# Patient Record
Sex: Male | Born: 1955 | Race: White | Hispanic: No | Marital: Single | Smoking: Never smoker
Health system: Southern US, Community
[De-identification: ages and names within clinical notes are randomized; demographics above are authoritative.]

## PROBLEM LIST (undated history)

## (undated) DIAGNOSIS — T7840XA Allergy, unspecified, initial encounter: Secondary | ICD-10-CM

## (undated) DIAGNOSIS — J189 Pneumonia, unspecified organism: Secondary | ICD-10-CM

## (undated) DIAGNOSIS — J4540 Moderate persistent asthma, uncomplicated: Secondary | ICD-10-CM

## (undated) DIAGNOSIS — K4020 Bilateral inguinal hernia, without obstruction or gangrene, not specified as recurrent: Secondary | ICD-10-CM

## (undated) DIAGNOSIS — K219 Gastro-esophageal reflux disease without esophagitis: Secondary | ICD-10-CM

## (undated) DIAGNOSIS — I10 Essential (primary) hypertension: Secondary | ICD-10-CM

## (undated) DIAGNOSIS — R51 Headache: Secondary | ICD-10-CM

## (undated) DIAGNOSIS — M1990 Unspecified osteoarthritis, unspecified site: Secondary | ICD-10-CM

## (undated) DIAGNOSIS — S2249XA Multiple fractures of ribs, unspecified side, initial encounter for closed fracture: Secondary | ICD-10-CM

## (undated) DIAGNOSIS — T827XXA Infection and inflammatory reaction due to other cardiac and vascular devices, implants and grafts, initial encounter: Secondary | ICD-10-CM

## (undated) DIAGNOSIS — G709 Myoneural disorder, unspecified: Secondary | ICD-10-CM

## (undated) DIAGNOSIS — R32 Unspecified urinary incontinence: Secondary | ICD-10-CM

## (undated) DIAGNOSIS — G8921 Chronic pain due to trauma: Secondary | ICD-10-CM

## (undated) DIAGNOSIS — M5412 Radiculopathy, cervical region: Secondary | ICD-10-CM

## (undated) DIAGNOSIS — F329 Major depressive disorder, single episode, unspecified: Secondary | ICD-10-CM

## (undated) DIAGNOSIS — J45909 Unspecified asthma, uncomplicated: Secondary | ICD-10-CM

## (undated) DIAGNOSIS — F32A Depression, unspecified: Secondary | ICD-10-CM

## (undated) DIAGNOSIS — Z973 Presence of spectacles and contact lenses: Secondary | ICD-10-CM

## (undated) DIAGNOSIS — G250 Essential tremor: Secondary | ICD-10-CM

## (undated) DIAGNOSIS — J449 Chronic obstructive pulmonary disease, unspecified: Secondary | ICD-10-CM

## (undated) DIAGNOSIS — R0602 Shortness of breath: Secondary | ICD-10-CM

## (undated) DIAGNOSIS — F419 Anxiety disorder, unspecified: Secondary | ICD-10-CM

## (undated) DIAGNOSIS — K4090 Unilateral inguinal hernia, without obstruction or gangrene, not specified as recurrent: Secondary | ICD-10-CM

## (undated) HISTORY — PX: DEEP BRAIN STIMULATOR PLACEMENT: SHX608

## (undated) HISTORY — PX: NASAL SINUS SURGERY: SHX719

## (undated) HISTORY — DX: Chronic pain due to trauma: G89.21

## (undated) HISTORY — DX: Unspecified urinary incontinence: R32

## (undated) HISTORY — DX: Allergy, unspecified, initial encounter: T78.40XA

---
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
## 1999-06-17 ENCOUNTER — Encounter: Payer: Self-pay | Admitting: Physical Medicine & Rehabilitation

## 1999-06-17 ENCOUNTER — Ambulatory Visit (HOSPITAL_COMMUNITY)
Admission: RE | Admit: 1999-06-17 | Discharge: 1999-06-17 | Payer: Self-pay | Admitting: Physical Medicine & Rehabilitation

## 2006-09-18 ENCOUNTER — Ambulatory Visit (HOSPITAL_COMMUNITY): Admission: RE | Admit: 2006-09-18 | Discharge: 2006-09-18 | Payer: Self-pay | Admitting: Neurosurgery

## 2006-12-02 ENCOUNTER — Ambulatory Visit (HOSPITAL_COMMUNITY): Admission: RE | Admit: 2006-12-02 | Discharge: 2006-12-02 | Payer: Self-pay | Admitting: Neurosurgery

## 2006-12-03 ENCOUNTER — Inpatient Hospital Stay (HOSPITAL_COMMUNITY): Admission: RE | Admit: 2006-12-03 | Discharge: 2006-12-04 | Payer: Self-pay | Admitting: Neurosurgery

## 2006-12-03 HISTORY — PX: DEEP BRAIN STIMULATOR PLACEMENT: SHX608

## 2006-12-10 ENCOUNTER — Observation Stay (HOSPITAL_COMMUNITY): Admission: RE | Admit: 2006-12-10 | Discharge: 2006-12-11 | Payer: Self-pay | Admitting: Neurosurgery

## 2009-09-27 ENCOUNTER — Ambulatory Visit: Payer: Self-pay | Admitting: Internal Medicine

## 2010-11-12 NOTE — Op Note (Signed)
NAMELEEVI, CULLARS               ACCOUNT NO.:  [id number]   MEDICAL RECORD NO.:  [id number]          PATIENT TYPE:  INP   LOCATION:  2550                         FACILITY:  [facility]   PHYSICIAN:  Danae Orleans. Venetia Maxon, M.D.  DATE OF BIRTH:  30-Oct-1955   DATE OF PROCEDURE:  12/10/2006  DATE OF DISCHARGE:                               OPERATIVE REPORT   PREOPERATIVE DIAGNOSIS:  Status post stage I deep brain stimulating  electrode with bilateral the VIM electrodes.   FINAL DIAGNOSIS:  Status post stage I deep brain stimulating electrode  with bilateral the VIM electrodes.   PROCEDURE:  Stage II deep brain stimulator electrodes with Kinetra dual-  channel battery and extensions placed.   SURGEON:  Danae Orleans. Venetia Maxon, M.D.   ANESTHESIA:  General endotracheal anesthesia.   ESTIMATED BLOOD LOSS:  Minimal.   COMPLICATIONS:  None.   DISPOSITION:  To recovery.   INDICATIONS:  Rhyse Loux is a 55 year old man with a severe essential  tremor.  He has previously undergone placement of bilateral deep brain  stimulating electrodes through a stereotactic procedure.  This is stage  II, which entails connecting the implanted electrodes via extensions to  a supraclavicular dual-channel generator.   PROCEDURE:  Mr. Noreen is brought to the operating room.  Following the  satisfactory and uncomplicated induction of general endotracheal  anesthesia and placement of intravenous lines, the patient was placed in  the supine position on the operating table.  His left postauricular  scalp and upper chest were then prepped and draped in the usual sterile  fashion.  Incision was made overlying the previously-placed electrode  extensions and these were then exposed.  The 41 cm extensions were then  tunneled to a left supraclavicular subcutaneous pocket which was  created.  The Kinetra battery was then placed.  The extensions were  hooked to the battery.  The left brain electrode was hooked to the 0  through  3 contacts, the right brain was 4 through 7 contacts.  The  extensions were covered with Silastic booties in the standard fashion.  The left-sided electrode was covered with a white boot and the right  electrode was with a clear boot.  Silk 2-0 ties were placed over the  Silastic covers.  The redundant electrode was then circularized beneath  the battery and inserted the subcutaneous pocket.  All incisions were  closed with 2-0 Vicryl sutures and the supraclavicular incision was  closed with a 3-0 Vicryl subcuticular stitch.  The cranial incision  was closed with a running 3-0 nylon locked stitch.  The wounds were  dressed with a sterile occlusive dressing.  The patient was extubated in  the operating room and taken to the recovery room in stable and  satisfactory condition, having tolerated his operation well with all  counts correct at the end of the case.      Danae Orleans. Venetia Maxon, M.D.  Electronically Signed     JDS/MEDQ  D:  12/10/2006  T:  12/11/2006  Job:  [id number]

## 2010-11-12 NOTE — Op Note (Signed)
David Rhodes, David Rhodes               ACCOUNT NO.:  [id number]   MEDICAL RECORD NO.:  [id number]          PATIENT TYPE:  INP   LOCATION:  3114                         FACILITY:  [facility]   PHYSICIAN:  Danae Orleans. Venetia Maxon, M.D.  DATE OF BIRTH:  05/04/1956   DATE OF PROCEDURE:  DATE OF DISCHARGE:                               OPERATIVE REPORT   PREOPERATIVE DIAGNOSIS:  Essential tremor.   POSTOPERATIVE DIAGNOSIS:  Essential tremor.   PROCEDURE:  Stage I bilateral deep brain stimulator electrode placement  in the ventral intermediate nucleus of the thalamus using stealth CT and  MRI frameless stereotaxy with microelectrode recordings.   SURGEON:  Danae Orleans. Venetia Maxon, M.D.   ASSISTANT:  Georgiann Cocker, RN.   ANESTHESIA:  IV sedation monitored anesthesia with local lidocaine.   COMPLICATIONS:  None.   DISPOSITION:  Recovery.   INDICATIONS:  David Rhodes is a 55 year old man with a severe essential  tremor.  He has been refractory to medications.  It was elected to place  bilateral deep brain stimulating ventral intermediate nucleus thalamus  electrodes for tremor.   DESCRIPTION OF PROCEDURE:  Prior to surgery David Rhodes had had an MRI  without fiducials and a CT scan obtained with six fiducials placed in  the skull.  These scans were then merged and surgical planning was  performed prior to surgery with bilateral VIM thalamus targeting.  The  patient was then brought to the operating room; and he was placed in a  neck collar; and his scalp was registered using the [employer].  The planned bur holes were then marked after prepping the skin  with DuraPrep.  The skin was then infiltrated with 1% of local lidocaine  with epinephrine; and a twist drill was used to produce a mark to mark  the skull on each side.  These entry points were just anterior to the  coronal suture and about 3.5 cm off the midline.   Subsequently the scalp was then prepped and draped in the usual sterile  fashion and the area of planned incision was infiltrated with 1% local  lidocaine with epinephrine.  A curvilinear incision was created and a  scalp flap brought forward.  The patient was given intravenous Presidex  for sedation.  The bur holes were then placed bilaterally using 14-mm  perforator.  The dura was cauterized with bipolar electrocautery.  Bone  wax was used for hemostasis.  The Stimlock caps were then affixed to the  skull and, initially on the left side, using the Medtronic frameless  stereotactic system; a tower with a microdrive was then created; and  after confirming the targeting, the dura was opened.  The introducer  stylet was initially placed and then subsequently the microelectrode was  utilized.  There was good kinesthetic thalamic activity from  about 10  mm above target to 1.8 mm above target; and then a quiet zone.  Then  subsequently since it appeared to be consistent, transitioned into what  appeared to be consistent with the VC thalamus with some sensory  stimulation resulting.  After this the stimulating electrode  was placed;  and the patient had significant improvement in right-sided tremor at 0,  a negative 3 positive, with cessation of tremor identified on writing  task and also finger-nose-finger testing.  The electrode was then locked  into position; and tunneled into the left posteroparietal region just  above the ear.   Attention was then turned to the right side where similar microelectrode  recordings were obtained with a very similar transition to VC thalamus;  and after stimulating electrode was placed, the patient had excellent  relief of his tremor with transient paresthesias into the hand; and at  higher voltages into the mouth.  The electrodes were then locked into  position; and the cap was placed.  Again, this electrode was tunneled  just above the ear on the left side.  The wound was then irrigated and  closed with 2-0 Vicryl sutures  reapproximating the galea and a running 3-  0 nylon lock stitch.  The wound was dressed with bacitracin-Telfa.  The  the fiducial markers were then removed; and a sterile occlusive head  dressing was placed.  The patient was taken to recovery in stable  satisfactory condition, having tolerated the procedure well.      Danae Orleans. Venetia Maxon, M.D.  Electronically Signed     JDS/MEDQ  D:  12/03/2006  T:  12/03/2006  Job:  [id number]

## 2011-04-17 LAB — BASIC METABOLIC PANEL
BUN: 11
CO2: 26
Calcium: 9.3
Chloride: 105
Creatinine, Ser: 0.9
GFR calc Af Amer: >60
GFR calc non Af Amer: >60
Glucose, Bld: 92
Potassium: 4
Sodium: 136

## 2011-04-17 LAB — CBC
Hemoglobin: 15.4
MCHC: 33.3
MCV: 88.1
RBC: 5.25

## 2011-07-20 ENCOUNTER — Emergency Department: Payer: Self-pay | Admitting: Emergency Medicine

## 2011-07-20 LAB — COMPREHENSIVE METABOLIC PANEL
Albumin: 3.4 g/dL (ref 3.4–5.0)
Alkaline Phosphatase: 84 U/L (ref 50–136)
Anion Gap: 11 (ref 7–16)
Bilirubin,Total: 0.3 mg/dL (ref 0.2–1.0)
Calcium, Total: 9.4 mg/dL (ref 8.5–10.1)
Co2: 28 mmol/L (ref 21–32)
Creatinine: 0.98 mg/dL (ref 0.60–1.30)
EGFR (Non-African Amer.): >60
Glucose: 89 mg/dL (ref 65–99)
Osmolality: 289 (ref 275–301)
SGPT (ALT): 22 U/L
Sodium: 146 mmol/L — ABNORMAL HIGH (ref 136–145)

## 2011-07-20 LAB — URINALYSIS, COMPLETE
Bacteria: NONE SEEN
Bilirubin,UR: NEGATIVE
Glucose,UR: NEGATIVE mg/dL (ref 0–75)
Leukocyte Esterase: NEGATIVE
Nitrite: NEGATIVE
Ph: 6 (ref 4.5–8.0)
Protein: NEGATIVE
RBC,UR: <1 /HPF (ref 0–5)

## 2011-07-20 LAB — CBC
MCH: 30.2 pg (ref 26.0–34.0)
MCV: 90 fL (ref 80–100)
Platelet: 200 10*3/uL (ref 150–440)
RDW: 12.3 % (ref 11.5–14.5)
WBC: 8.8 10*3/uL (ref 3.8–10.6)

## 2011-11-14 ENCOUNTER — Telehealth: Payer: Self-pay | Admitting: Internal Medicine

## 2011-11-14 NOTE — Telephone Encounter (Signed)
Made pt appointment 5/31 Told pt several time that if pain is severe he needs to go to er or an urgent care   Fine to put in next 30-29min slot. However, if pain severe, needs to be seen in ED or urgent care. ----- Message ----- From: Wyatt Portela Sent: 11/14/2011 10:13 AM To: Shelia Media, MD, Sherlene Shams, MD Subject: new patient   [name] @ [facility] Call to see if we could see a new patient asap he has pain from a hernia and would like a referral but needs primary care dr first. David Rhodes September 24, 2055 [phone] Pt has medicare

## 2011-11-28 ENCOUNTER — Ambulatory Visit: Payer: Self-pay | Admitting: Internal Medicine

## 2011-12-02 ENCOUNTER — Other Ambulatory Visit: Payer: Self-pay | Admitting: Neurosurgery

## 2011-12-03 ENCOUNTER — Encounter (HOSPITAL_COMMUNITY): Payer: Self-pay | Admitting: Respiratory Therapy

## 2011-12-04 ENCOUNTER — Ambulatory Visit: Payer: Self-pay | Admitting: Otolaryngology

## 2011-12-04 NOTE — Pre-Procedure Instructions (Signed)
20 David Rhodes  12/04/2011   Your procedure is scheduled on:  Tues, June 11 @ 10:20 AM  Report to [facility] at 7:15 AM.  Call this number if you have problems the morning of surgery: [phone]   Remember:   Do not eat food:After Midnight.  May have clear liquids: up to 4 Hours before arrival.(until 3:15 am)  Clear liquids include soda, tea, black coffee, apple or grape juice, broth,water  Take these medicines the morning of surgery with A SIP OF WATER: Amlodipine(Norvasc),Allegra(Fexofenadine),and Gabapentin(Neurontin)   Do not wear jewelry  Do not wear lotions, powders, or cologne             Men may shave face and neck.  Do not bring valuables to the hospital.  Contacts, dentures or bridgework may not be worn into surgery.  Leave suitcase in the car. After surgery it may be brought to your room.  For patients admitted to the hospital, checkout time is 11:00 AM the day of discharge.   Patients discharged the day of surgery will not be allowed to drive home.    Special Instructions: CHG Shower Use Special Wash: 1/2 bottle night before surgery and 1/2 bottle morning of surgery.   Please read over the following fact sheets that you were given: Pain Booklet, Coughing and Deep Breathing, MRSA Information and Surgical Site Infection Prevention

## 2011-12-05 ENCOUNTER — Inpatient Hospital Stay (HOSPITAL_COMMUNITY): Admission: RE | Admit: 2011-12-05 | Payer: Medicare Other | Source: Ambulatory Visit

## 2011-12-08 ENCOUNTER — Inpatient Hospital Stay (HOSPITAL_COMMUNITY): Admission: RE | Admit: 2011-12-08 | Discharge: 2011-12-08 | Payer: Medicare Other | Source: Ambulatory Visit

## 2011-12-08 MED ORDER — CEFAZOLIN SODIUM 1-5 GM-% IV SOLN: 1.0000 g | INTRAVENOUS | Status: DC

## 2011-12-08 NOTE — Progress Notes (Signed)
We have been unable to reach patient after numerous calls, I left message on his phone to arrive at [facility] at 0745, NPO after MN.  I also instructed him to take Amlodipine, Allerga, Singular and Requip with a sip of water and if he has any questions to call [phone].

## 2011-12-09 ENCOUNTER — Encounter (HOSPITAL_COMMUNITY)
Admission: RE | Disposition: A | Discharge status: Home / Self Care | Payer: Self-pay | Source: Ambulatory Visit | Attending: Neurosurgery

## 2011-12-09 ENCOUNTER — Encounter (HOSPITAL_COMMUNITY): Payer: Self-pay | Admitting: Anesthesiology

## 2011-12-09 ENCOUNTER — Ambulatory Visit (HOSPITAL_COMMUNITY)
Admission: RE | Admit: 2011-12-09 | Discharge: 2011-12-09 | Disposition: A | Discharge status: Home / Self Care | Payer: Medicare Other | Source: Ambulatory Visit | Attending: Neurosurgery | Admitting: Neurosurgery

## 2011-12-09 ENCOUNTER — Ambulatory Visit (HOSPITAL_COMMUNITY): Payer: Medicare Other | Admitting: Anesthesiology

## 2011-12-09 ENCOUNTER — Ambulatory Visit: Payer: Self-pay | Admitting: Internal Medicine

## 2011-12-09 ENCOUNTER — Ambulatory Visit (HOSPITAL_COMMUNITY): Payer: Medicare Other

## 2011-12-09 ENCOUNTER — Encounter (HOSPITAL_COMMUNITY): Payer: Self-pay | Admitting: *Deleted

## 2011-12-09 DIAGNOSIS — Z462 Encounter for fitting and adjustment of other devices related to nervous system and special senses: Secondary | ICD-10-CM | POA: Insufficient documentation

## 2011-12-09 DIAGNOSIS — G250 Essential tremor: Secondary | ICD-10-CM | POA: Insufficient documentation

## 2011-12-09 DIAGNOSIS — G252 Other specified forms of tremor: Secondary | ICD-10-CM | POA: Insufficient documentation

## 2011-12-09 HISTORY — DX: Gastro-esophageal reflux disease without esophagitis: K21.9

## 2011-12-09 HISTORY — DX: Myoneural disorder, unspecified: G70.9

## 2011-12-09 HISTORY — DX: Shortness of breath: R06.02

## 2011-12-09 HISTORY — DX: Headache: R51

## 2011-12-09 HISTORY — DX: Anxiety disorder, unspecified: F41.9

## 2011-12-09 HISTORY — DX: Major depressive disorder, single episode, unspecified: F32.9

## 2011-12-09 HISTORY — PX: SUBTHALAMIC STIMULATOR BATTERY REPLACEMENT: SHX5405

## 2011-12-09 HISTORY — DX: Essential (primary) hypertension: I10

## 2011-12-09 HISTORY — DX: Depression, unspecified: F32.A

## 2011-12-09 HISTORY — DX: Pneumonia, unspecified organism: J18.9

## 2011-12-09 LAB — CBC
MCH: 30 pg (ref 26.0–34.0)
MCHC: 33.8 g/dL (ref 30.0–36.0)
MCV: 88.9 fL (ref 78.0–100.0)
Platelets: 230 10*3/uL (ref 150–400)
RDW: 13.4 % (ref 11.5–15.5)
WBC: 13.9 10*3/uL — ABNORMAL HIGH (ref 4.0–10.5)

## 2011-12-09 LAB — BASIC METABOLIC PANEL
Chloride: 104 mEq/L (ref 96–112)
GFR calc Af Amer: 88 mL/min — ABNORMAL LOW (ref >90)
GFR calc non Af Amer: 76 mL/min — ABNORMAL LOW (ref >90)
Potassium: 3.3 mEq/L — ABNORMAL LOW (ref 3.5–5.1)
Sodium: 142 mEq/L (ref 135–145)

## 2011-12-09 LAB — SURGICAL PCR SCREEN
MRSA, PCR: NEGATIVE
Staphylococcus aureus: NEGATIVE

## 2011-12-09 SURGERY — SUBTHALAMIC STIMULATOR BATTERY REPLACEMENT
Anesthesia: General | Site: Chest | Wound class: Clean

## 2011-12-09 MED ORDER — ACETAMINOPHEN 325 MG PO TABS: 650.0000 mg | ORAL_TABLET | ORAL | Status: DC | PRN

## 2011-12-09 MED ORDER — HYDROCODONE-ACETAMINOPHEN 5-325 MG PO TABS: 1.0000 | ORAL_TABLET | ORAL | Status: DC | PRN

## 2011-12-09 MED ORDER — SODIUM CHLORIDE 0.9 % IV SOLN
INTRAVENOUS | Status: AC
  Filled 2011-12-09: qty 500

## 2011-12-09 MED ORDER — LACTATED RINGERS IV SOLN
INTRAVENOUS | Status: DC | PRN
  Administered 2011-12-09: 10:00:00 via INTRAVENOUS

## 2011-12-09 MED ORDER — VANCOMYCIN HCL 1000 MG IV SOLR
INTRAVENOUS | Status: AC
  Filled 2011-12-09: qty 1000

## 2011-12-09 MED ORDER — SODIUM CHLORIDE 0.9 % IR SOLN
Status: DC | PRN
  Administered 2011-12-09: 11:00:00

## 2011-12-09 MED ORDER — ROPINIROLE HCL 1 MG PO TABS
2.0000 mg | ORAL_TABLET | Freq: Two times a day (BID) | ORAL | Status: DC
  Administered 2011-12-09: 2 mg via ORAL
  Filled 2011-12-09 (×2): qty 2

## 2011-12-09 MED ORDER — MUPIROCIN 2 % EX OINT
TOPICAL_OINTMENT | CUTANEOUS | Status: AC
  Administered 2011-12-09: 1
  Filled 2011-12-09: qty 22

## 2011-12-09 MED ORDER — ACETAMINOPHEN 10 MG/ML IV SOLN
INTRAVENOUS | Status: AC
  Administered 2011-12-09: 1000 mg via INTRAVENOUS
  Filled 2011-12-09: qty 100

## 2011-12-09 MED ORDER — MORPHINE SULFATE 2 MG/ML IJ SOLN: 1.0000 mg | INTRAMUSCULAR | Status: DC | PRN

## 2011-12-09 MED ORDER — SODIUM CHLORIDE 0.9 % IJ SOLN
3.0000 mL | Freq: Two times a day (BID) | INTRAMUSCULAR | Status: DC
  Administered 2011-12-09: 3 mL via INTRAVENOUS

## 2011-12-09 MED ORDER — SODIUM CHLORIDE 0.9 % IV SOLN
200.0000 ug | INTRAVENOUS | Status: DC | PRN
  Administered 2011-12-09: 0.4 ug/kg/h via INTRAVENOUS

## 2011-12-09 MED ORDER — BACITRACIN 50000 UNITS IM SOLR
INTRAMUSCULAR | Status: AC
  Filled 2011-12-09: qty 1

## 2011-12-09 MED ORDER — LORATADINE 10 MG PO TABS: 10.0000 mg | ORAL_TABLET | Freq: Every day | ORAL | Status: DC

## 2011-12-09 MED ORDER — ROPINIROLE HCL 1 MG PO TABS: 2.0000 mg | ORAL_TABLET | Freq: Two times a day (BID) | ORAL | Status: DC

## 2011-12-09 MED ORDER — LIDOCAINE-EPINEPHRINE 1 %-1:100000 IJ SOLN
INTRAMUSCULAR | Status: DC | PRN
  Administered 2011-12-09: 7 mL

## 2011-12-09 MED ORDER — 0.9 % SODIUM CHLORIDE (POUR BTL) OPTIME
TOPICAL | Status: DC | PRN
  Administered 2011-12-09: 1000 mL

## 2011-12-09 MED ORDER — MONTELUKAST SODIUM 10 MG PO TABS
10.0000 mg | ORAL_TABLET | Freq: Every day | ORAL | Status: DC
  Filled 2011-12-09: qty 1

## 2011-12-09 MED ORDER — MENTHOL 3 MG MT LOZG: 1.0000 | LOZENGE | OROMUCOSAL | Status: DC | PRN

## 2011-12-09 MED ORDER — HYDROMORPHONE HCL PF 1 MG/ML IJ SOLN: 0.2500 mg | INTRAMUSCULAR | Status: DC | PRN

## 2011-12-09 MED ORDER — MIDAZOLAM HCL 5 MG/5ML IJ SOLN
INTRAMUSCULAR | Status: DC | PRN
  Administered 2011-12-09: 2 mg via INTRAVENOUS

## 2011-12-09 MED ORDER — BUPIVACAINE HCL (PF) 0.5 % IJ SOLN
INTRAMUSCULAR | Status: DC | PRN
  Administered 2011-12-09: 7 mL

## 2011-12-09 MED ORDER — PHENOL 1.4 % MT LIQD: 1.0000 | OROMUCOSAL | Status: DC | PRN

## 2011-12-09 MED ORDER — DEXTROSE 5 % IV SOLN
INTRAVENOUS | Status: DC | PRN
  Administered 2011-12-09: 10:00:00 via INTRAVENOUS

## 2011-12-09 MED ORDER — ONDANSETRON HCL 4 MG/2ML IJ SOLN
INTRAMUSCULAR | Status: DC | PRN
  Administered 2011-12-09: 4 mg via INTRAVENOUS

## 2011-12-09 MED ORDER — SODIUM CHLORIDE 0.9 % IV SOLN
0.4000 ug/kg/h | INTRAVENOUS | Status: DC
  Filled 2011-12-09: qty 4

## 2011-12-09 MED ORDER — VANCOMYCIN HCL IN DEXTROSE 1-5 GM/200ML-% IV SOLN
INTRAVENOUS | Status: AC
  Filled 2011-12-09: qty 200

## 2011-12-09 MED ORDER — ACETAMINOPHEN 650 MG RE SUPP: 650.0000 mg | RECTAL | Status: DC | PRN

## 2011-12-09 MED ORDER — DOCUSATE SODIUM 100 MG PO CAPS: 100.0000 mg | ORAL_CAPSULE | Freq: Two times a day (BID) | ORAL | Status: DC

## 2011-12-09 MED ORDER — AMLODIPINE BESYLATE 10 MG PO TABS
10.0000 mg | ORAL_TABLET | Freq: Every day | ORAL | Status: DC
  Administered 2011-12-09: 10 mg via ORAL
  Filled 2011-12-09: qty 1

## 2011-12-09 MED ORDER — OXYCODONE-ACETAMINOPHEN 5-325 MG PO TABS: 1.0000 | ORAL_TABLET | ORAL | Status: DC | PRN

## 2011-12-09 MED ORDER — GABAPENTIN 600 MG PO TABS
600.0000 mg | ORAL_TABLET | Freq: Three times a day (TID) | ORAL | Status: DC
  Administered 2011-12-09: 600 mg via ORAL
  Filled 2011-12-09 (×2): qty 1

## 2011-12-09 MED ORDER — LISINOPRIL 40 MG PO TABS
40.0000 mg | ORAL_TABLET | Freq: Every day | ORAL | Status: DC
Start: 2011-12-09 — End: 2011-12-09
  Administered 2011-12-09: 40 mg via ORAL
  Filled 2011-12-09: qty 1

## 2011-12-09 MED ORDER — SODIUM CHLORIDE 0.9 % IJ SOLN: 3.0000 mL | INTRAMUSCULAR | Status: DC | PRN

## 2011-12-09 MED ORDER — VANCOMYCIN HCL 1000 MG IV SOLR
INTRAVENOUS | Status: DC | PRN
  Administered 2011-12-09: 1000 mg via TOPICAL

## 2011-12-09 MED ORDER — ONDANSETRON HCL 4 MG/2ML IJ SOLN: 4.0000 mg | Freq: Once | INTRAMUSCULAR | Status: DC | PRN

## 2011-12-09 MED ORDER — KCL IN DEXTROSE-NACL 20-5-0.45 MEQ/L-%-% IV SOLN
INTRAVENOUS | Status: DC
  Filled 2011-12-09 (×2): qty 1000

## 2011-12-09 MED ORDER — ONDANSETRON HCL 4 MG/2ML IJ SOLN: 4.0000 mg | INTRAMUSCULAR | Status: DC | PRN

## 2011-12-09 MED ORDER — FENTANYL CITRATE 0.05 MG/ML IJ SOLN
INTRAMUSCULAR | Status: DC | PRN
  Administered 2011-12-09: 50 ug via INTRAVENOUS

## 2011-12-09 MED ORDER — VANCOMYCIN HCL IN DEXTROSE 1-5 GM/200ML-% IV SOLN
1000.0000 mg | Freq: Once | INTRAVENOUS | Status: AC
  Administered 2011-12-09: 1000 mg via INTRAVENOUS
  Filled 2011-12-09: qty 200

## 2011-12-09 SURGICAL SUPPLY — 62 items
2x4 pocket adapter ×1 IMPLANT
APL SKNCLS STERI-STRIP NONHPOA (GAUZE/BANDAGES/DRESSINGS) ×1
BAG DECANTER FOR FLEXI CONT (MISCELLANEOUS) ×2 IMPLANT
BENZOIN TINCTURE PRP APPL 2/3 (GAUZE/BANDAGES/DRESSINGS) ×1 IMPLANT
BLADE SURG 10 STRL SS (BLADE) ×2 IMPLANT
BLADE SURG 15 STRL LF DISP TIS (BLADE) ×1 IMPLANT
BLADE SURG 15 STRL SS (BLADE) ×2
BOOT SUTURE AID YELLOW STND (SUTURE) ×1 IMPLANT
CANISTER SUCTION 2500CC (MISCELLANEOUS) ×2 IMPLANT
CLOTH BEACON ORANGE TIMEOUT ST (SAFETY) ×2 IMPLANT
CORDS BIPOLAR (ELECTRODE) ×2 IMPLANT
DRAPE INCISE IOBAN 66X45 STRL (DRAPES) ×2 IMPLANT
DRAPE LAPAROTOMY 100X72 PEDS (DRAPES) ×2 IMPLANT
DRAPE POUCH INSTRU U-SHP 10X18 (DRAPES) ×2 IMPLANT
DRESSING TELFA 8X3 (GAUZE/BANDAGES/DRESSINGS) ×2 IMPLANT
DRSG OPSITE 4X5.5 SM (GAUZE/BANDAGES/DRESSINGS) IMPLANT
ELECT CAUTERY BLADE 6.4 (BLADE) ×2 IMPLANT
GAUZE SPONGE 4X4 16PLY XRAY LF (GAUZE/BANDAGES/DRESSINGS) ×2 IMPLANT
GLOVE BIO SURGEON STRL SZ8 (GLOVE) ×2 IMPLANT
GLOVE BIOGEL PI IND STRL 7.0 (GLOVE) IMPLANT
GLOVE BIOGEL PI IND STRL 8 (GLOVE) ×1 IMPLANT
GLOVE BIOGEL PI IND STRL 8.5 (GLOVE) ×1 IMPLANT
GLOVE BIOGEL PI INDICATOR 7.0 (GLOVE) ×2
GLOVE BIOGEL PI INDICATOR 8 (GLOVE) ×1
GLOVE BIOGEL PI INDICATOR 8.5 (GLOVE) ×1
GLOVE ECLIPSE 7.5 STRL STRAW (GLOVE) ×1 IMPLANT
GLOVE ECLIPSE 8.0 STRL XLNG CF (GLOVE) ×1 IMPLANT
GLOVE EXAM NITRILE LRG STRL (GLOVE) IMPLANT
GLOVE EXAM NITRILE MD LF STRL (GLOVE) ×1 IMPLANT
GLOVE EXAM NITRILE XL STR (GLOVE) IMPLANT
GLOVE EXAM NITRILE XS STR PU (GLOVE) IMPLANT
GLOVE SURG SS PI 6.5 STRL IVOR (GLOVE) ×3 IMPLANT
GOWN BRE IMP SLV AUR LG STRL (GOWN DISPOSABLE) ×2 IMPLANT
GOWN BRE IMP SLV AUR XL STRL (GOWN DISPOSABLE) ×2 IMPLANT
GOWN STRL REIN 2XL LVL4 (GOWN DISPOSABLE) ×2 IMPLANT
KIT BASIN OR (CUSTOM PROCEDURE TRAY) ×2 IMPLANT
KIT ROOM TURNOVER OR (KITS) ×2 IMPLANT
NDL HYPO 25X1 1.5 SAFETY (NEEDLE) ×1 IMPLANT
NEEDLE HYPO 25X1 1.5 SAFETY (NEEDLE) ×2 IMPLANT
NEUROSTIM OCTOPOLAR ~~LOC~~ 49X65 (Neuro Prosthesis/Implant) ×1 IMPLANT
NS IRRIG 1000ML POUR BTL (IV SOLUTION) ×2 IMPLANT
PACK SURGICAL SETUP 50X90 (CUSTOM PROCEDURE TRAY) ×2 IMPLANT
PAD ARMBOARD 7.5X6 YLW CONV (MISCELLANEOUS) ×6 IMPLANT
PENCIL BUTTON HOLSTER BLD 10FT (ELECTRODE) ×2 IMPLANT
SPONGE GAUZE 4X4 12PLY (GAUZE/BANDAGES/DRESSINGS) ×2 IMPLANT
STAPLER SKIN PROX WIDE 3.9 (STAPLE) ×2 IMPLANT
STOCKINETTE 4X48 STRL (DRAPES) IMPLANT
STRIP CLOSURE SKIN 1/2X4 (GAUZE/BANDAGES/DRESSINGS) ×2 IMPLANT
SUT ETHILON 3 0 PS 1 (SUTURE) IMPLANT
SUT SILK 2 0 TIES 10X30 (SUTURE) IMPLANT
SUT VIC AB 2-0 CP2 18 (SUTURE) ×2 IMPLANT
SUT VIC AB 3-0 SH 8-18 (SUTURE) ×3 IMPLANT
SWABSTICK BENZOIN STERILE (MISCELLANEOUS) ×1 IMPLANT
SYR BULB 3OZ (MISCELLANEOUS) ×2 IMPLANT
SYR BULB IRRIGATION 50ML (SYRINGE) ×1 IMPLANT
SYR CONTROL 10ML LL (SYRINGE) ×2 IMPLANT
TAPE CLOTH SURG 4X10 WHT LF (GAUZE/BANDAGES/DRESSINGS) ×1 IMPLANT
TOWEL OR 17X24 6PK STRL BLUE (TOWEL DISPOSABLE) ×2 IMPLANT
TOWEL OR 17X26 10 PK STRL BLUE (TOWEL DISPOSABLE) ×2 IMPLANT
TUBE CONNECTING 12X1/4 (SUCTIONS) ×2 IMPLANT
UNDERPAD 30X30 INCONTINENT (UNDERPADS AND DIAPERS) ×1 IMPLANT
WATER STERILE IRR 1000ML POUR (IV SOLUTION) ×2 IMPLANT

## 2011-12-09 NOTE — Preoperative (Signed)
Beta Blockers   Reason not to administer Beta Blockers:Not Applicable 

## 2011-12-09 NOTE — Transfer of Care (Signed)
Immediate Anesthesia Transfer of Care Note  Patient: David Rhodes  Procedure(s) Performed: Procedure(s) (LRB): SUBTHALAMIC STIMULATOR BATTERY REPLACEMENT (N/A)  Patient Location: PACU  Anesthesia Type: MAC  Level of Consciousness: sedated, patient cooperative and responds to stimulation  Airway & Oxygen Therapy: Patient Spontanous Breathing  Post-op Assessment: Report given to PACU RN, Post -op Vital signs reviewed and stable, Patient moving all extremities and Patient moving all extremities X 4  Post vital signs: Reviewed and stable  Complications: No apparent anesthesia complications

## 2011-12-09 NOTE — Op Note (Signed)
12/09/2011  11:28 AM  PATIENT:  David Rhodes  56 y.o. male  PRE-OPERATIVE DIAGNOSIS:  Essential Tremor with Deep Brain Stimulators and Depleted Implantable Pulse Generator  POST-OPERATIVE DIAGNOSIS: Essential Tremor with Deep Brain Stimulators and Depleted Implantable Pulse Generator  PROCEDURE:  Procedure(s) (LRB): THALAMIC STIMULATOR BATTERY REPLACEMENT (N/A)  SURGEON:  Surgeon(s) and Role:    * Maeola Harman, MD - Primary  PHYSICIAN ASSISTANT:   ASSISTANTS: Poteat, RN  ANESTHESIA:   local and IV sedation  EBL:  Total I/O In: 500 [I.V.:500] Out: -   BLOOD ADMINISTERED:none  DRAINS: none   LOCAL MEDICATIONS USED:  LIDOCAINE   SPECIMEN:  No Specimen  DISPOSITION OF SPECIMEN:  N/A  COUNTS:  YES  TOURNIQUET:  * No tourniquets in log *  DICTATION: DICTATION: Patient has implanted thalamic stimulator electrodes and IPG, which is now depleted.  It was elected for patient to undergo IPG revision.  PROCEDURE: Patient was brought to the operating room and given intravenous sedation.  Left upper chest was prepped with betadine scrub and paint.  Area of planned incision was infiltrated with lidocaine.  Prior incision was reopened and the old IPG was externalized.  Adaptor was connected to new IPG which was placed in the pocket.  Wound was irrigated with bacitracin and with vancomycin. Then irrigated once more.  Incision was closed with 2-0 Vicryl and 3-0 vicryl sutures and dressed with a sterile occlusive dressing.  Counts were correct at the end of the case.  PLAN OF CARE: Admit for overnight observation  PATIENT DISPOSITION:  PACU - hemodynamically stable.   Delay start of Pharmacological VTE agent (>24hrs) due to surgical blood loss or risk of bleeding: yes

## 2011-12-09 NOTE — Discharge Summary (Signed)
Physician Discharge Summary  Patient ID: David Rhodes MRN: [id number] DOB/AGE: 56-Jul-1957 56 y.o.  Admit date: 12/09/2011 Discharge date: 12/09/2011  Admission Diagnoses: Essential Tremor with Deep Brain Stimulators and Depleted Implantable Pulse Generator    Discharge Diagnoses: Essential Tremor with Deep Brain Stimulators and Depleted Implantable Pulse Generator s/p THALAMIC STIMULATOR BATTERY REPLACEMENT    Active Problems:  * No active hospital problems. *    Discharged Condition: good  Hospital Course: David Rhodes was admitted 12/09/11 for replacement of his deep brain stimulator's implanted pulse generator/battery.  Following an uncomplicated surgery and recovery in Neuro PACU, he transferred to 3500 for observation.    Consults: None  Significant Diagnostic Studies: None  Treatments: surgery: THALAMIC STIMULATOR BATTERY REPLACEMENT   Discharge Exam: Blood pressure 119/68, pulse 68, temperature 97.4 F (36.3 C), temperature source Oral, resp. rate 16, height 6' (1.829 m), weight 81.647 kg (180 lb), SpO2 97.00%. Alert, conversant, without c/o pain or discomfort. Pt voices frustration with the recommendation that he stay overnight following the battery replacement. After discussion of the anesthesia used (due to the widening of the battery pocket that was necessary), he verbalizes understanding of concerns that he needed a driver and yet arrived for surgery alone. He does agree to stay until after he has eaten and walked in the halls.   Disposition: D/C to home. Self care.  He will call the office to schedule 2week f/u appt.  Discharge Orders    Future Appointments: Provider: Department: Dept Phone: Center:   12/25/2011 11:00 AM Shelia Media, MD [facility] [phone] None     Medication List  As of 12/09/2011  1:40 PM   ASK your doctor about these medications         amLODipine 10 MG tablet   Commonly known as: NORVASC   Take 10 mg by mouth daily.     fexofenadine 180 MG tablet   Commonly known as: ALLEGRA   Take 180 mg by mouth daily.      gabapentin 600 MG tablet   Commonly known as: NEURONTIN   Take 600 mg by mouth 3 (three) times daily.      lisinopril 40 MG tablet   Commonly known as: PRINIVIL,ZESTRIL   Take 40 mg by mouth daily.      montelukast 10 MG tablet   Commonly known as: SINGULAIR   Take 10 mg by mouth at bedtime.      rOPINIRole 2 MG tablet   Commonly known as: REQUIP   Take 2 mg by mouth 2 (two) times daily.             Signed: Georgiann Cocker 12/09/2011, 1:40 PM

## 2011-12-09 NOTE — Plan of Care (Signed)
Problem: Consults Goal: Diagnosis - Spinal Surgery PULSE GENERATOR CHANGE

## 2011-12-09 NOTE — H&P (Signed)
NEUROSURGICAL CONSULTATION   David Rhodes    DOB:  January 06, 1956 #[id number]    December 01, 2011   HISTORY:     Tylek Boney is a 56 year old man who is on disability, who has a deep brain stimulator electrode which we placed for essential tremor.  He has done very well with this device.  This was placed by me in 11/2006.  He says he is having worsening tremor, left greater than right.    We interrogated his device and he currently has a very low voltage at 2.2 volts.  His settings remain for the left side amplitude of 4.9 volts with a pulse width of 60 and rate of 135, case positive, 2 negative.  The right side has an amplitude of 3.4 volts, pulse width of 60, rate of 135, case positive, 7 negative.    PAST MEDICAL HISTORY:    . Current Medical Conditions:    He has a history of hypertension and currently no other medical problems.    . Medications and Allergies:  He is currently not taking any medications.    . Height and Weight:     He is currently 6' tall, 180 lbs.    SOCIAL HISTORY:    He denies tobacco, alcohol, or drug use.    PHYSICAL EXAMINATION:    . Blood Pressure, Pulse:     His blood pressure is 140/92.  Heart rate is 72.     IMPRESSION AND RECOMMENDATIONS: He does wish to have his implantable pulse generator revised and we are going to go ahead and set him up for IPG revision on 12/09/2011. Risks and benefits were discussed with the patient.  He initially was wanting to pursue a rechargeable implantable pulse generator; however, given the financial constraints regarding these devices in the hospital, he does want to go ahead with nonrechargeable device as the hospital is currently not going to cover the cost of rechargeable DBS devices.     [facility]    Danae Orleans. Venetia Maxon, M.D.  JDS:aft

## 2011-12-09 NOTE — Anesthesia Procedure Notes (Signed)
Procedure Name: MAC Date/Time: 12/09/2011 10:30 AM Performed by: Wray Kearns A Pre-anesthesia Checklist: Patient identified, Timeout performed, Emergency Drugs available, Suction available and Patient being monitored Patient Re-evaluated:Patient Re-evaluated prior to inductionOxygen Delivery Method: Simple face mask Intubation Type: IV induction

## 2011-12-09 NOTE — Interval H&P Note (Signed)
History and Physical Interval Note:  12/09/2011 7:41 AM  David Rhodes  has presented today for surgery, with the diagnosis of Tremor  The various methods of treatment have been discussed with the patient and family. After consideration of risks, benefits and other options for treatment, the patient has consented to  Procedure(s) (LRB): SUBTHALAMIC STIMULATOR BATTERY REPLACEMENT (N/A) as a surgical intervention .  The patients' history has been reviewed, patient examined, no change in status, stable for surgery.  I have reviewed the patients' chart and labs.  Questions were answered to the patient's satisfaction.     Adriano Bischof D  Date of Initial H&P: 12/01/2011  History reviewed, patient examined, no change in status, stable for surgery.

## 2011-12-09 NOTE — Anesthesia Preprocedure Evaluation (Addendum)
Anesthesia Evaluation  Patient identified by MRN, date of birth, ID band Patient awake    Reviewed: Allergy & Precautions, H&P , NPO status , Patient's Chart, lab work & pertinent test results  Airway Mallampati: II      Dental  (+) Edentulous Upper and Edentulous Lower   Pulmonary  breath sounds clear to auscultation        Cardiovascular Rhythm:Regular Rate:Normal     Neuro/Psych    GI/Hepatic   Endo/Other    Renal/GU      Musculoskeletal   Abdominal   Peds  Hematology   Anesthesia Other Findings   Reproductive/Obstetrics                           Anesthesia Physical Anesthesia Plan  ASA: III  Anesthesia Plan: MAC   Post-op Pain Management:    Induction: Intravenous  Airway Management Planned: Natural Airway and Simple Face Mask  Additional Equipment:   Intra-op Plan:   Post-operative Plan:   Informed Consent: I have reviewed the patients History and Physical, chart, labs and discussed the procedure including the risks, benefits and alternatives for the proposed anesthesia with the patient or authorized representative who has indicated his/her understanding and acceptance.     Plan Discussed with:   Anesthesia Plan Comments: (Essential Tremor Htn  Plan MAC  Kipp Brood, MD)       Anesthesia Quick Evaluation

## 2011-12-09 NOTE — Anesthesia Postprocedure Evaluation (Signed)
  Anesthesia Post-op Note  Patient: David Rhodes  Procedure(s) Performed: Procedure(s) (LRB): SUBTHALAMIC STIMULATOR BATTERY REPLACEMENT (N/A)  Patient Location: PACU  Anesthesia Type: MAC  Level of Consciousness: awake, alert  and oriented  Airway and Oxygen Therapy: Patient Spontanous Breathing  Post-op Pain: none  Post-op Assessment: Post-op Vital signs reviewed  Post-op Vital Signs: stable  Complications: No apparent anesthesia complications

## 2011-12-09 NOTE — Progress Notes (Signed)
Pt. Tolerated procedure well. Pt. Alert and oriented,follows simple instructions, denies pain. Incision area without swelling, redness or S/S of infection. Voiding adequate clear yellow urine. Moving all extremities well and vitals stable and documented. Deep Brain Stimulator battery change surgery notes instructions given to patient for home safety and precautions.Pt stated understanding of instructions given. Pt.understand the importance of driving home safely and to be very caution with traffic.

## 2011-12-09 NOTE — Plan of Care (Signed)
Problem: Consults Goal: Diagnosis - Spinal Surgery DEEP BRAIN STIMULATOR,

## 2011-12-11 ENCOUNTER — Encounter (HOSPITAL_COMMUNITY): Payer: Self-pay | Admitting: Neurosurgery

## 2011-12-25 ENCOUNTER — Encounter: Payer: Self-pay | Admitting: Internal Medicine

## 2011-12-25 ENCOUNTER — Ambulatory Visit (INDEPENDENT_AMBULATORY_CARE_PROVIDER_SITE_OTHER): Payer: Medicare Other | Admitting: Internal Medicine

## 2011-12-25 VITALS — BP 140/100 | HR 98 | Temp 98.8°F | Ht 72.0 in | Wt 179.5 lb

## 2011-12-25 DIAGNOSIS — R1013 Epigastric pain: Secondary | ICD-10-CM

## 2011-12-25 DIAGNOSIS — I10 Essential (primary) hypertension: Secondary | ICD-10-CM

## 2011-12-25 DIAGNOSIS — K4090 Unilateral inguinal hernia, without obstruction or gangrene, not specified as recurrent: Secondary | ICD-10-CM

## 2011-12-25 DIAGNOSIS — Z1211 Encounter for screening for malignant neoplasm of colon: Secondary | ICD-10-CM

## 2011-12-25 DIAGNOSIS — Z0000 Encounter for general adult medical examination without abnormal findings: Secondary | ICD-10-CM

## 2011-12-25 DIAGNOSIS — G8929 Other chronic pain: Secondary | ICD-10-CM | POA: Insufficient documentation

## 2011-12-25 DIAGNOSIS — M549 Dorsalgia, unspecified: Secondary | ICD-10-CM

## 2011-12-25 LAB — COMPREHENSIVE METABOLIC PANEL
Albumin: 4.1 g/dL (ref 3.5–5.2)
BUN: 9 mg/dL (ref 6–23)
CO2: 29 mEq/L (ref 19–32)
Calcium: 10.2 mg/dL (ref 8.4–10.5)
Chloride: 105 mEq/L (ref 96–112)
GFR: 97.68 mL/min (ref >60.00)
Glucose, Bld: 75 mg/dL (ref 70–99)
Potassium: 4.4 mEq/L (ref 3.5–5.1)
Sodium: 140 mEq/L (ref 135–145)
Total Protein: 7.2 g/dL (ref 6.0–8.3)

## 2011-12-25 LAB — CBC WITH DIFFERENTIAL/PLATELET
Basophils Relative: 0.4 % (ref 0.0–3.0)
Eosinophils Absolute: 0.1 10*3/uL (ref 0.0–0.7)
HCT: 44 % (ref 39.0–52.0)
Hemoglobin: 14.6 g/dL (ref 13.0–17.0)
Lymphocytes Relative: 14.4 % (ref 12.0–46.0)
Lymphs Abs: 1.8 10*3/uL (ref 0.7–4.0)
MCHC: 33.2 g/dL (ref 30.0–36.0)
Neutro Abs: 9.6 10*3/uL — ABNORMAL HIGH (ref 1.4–7.7)
RBC: 4.81 Mil/uL (ref 4.22–5.81)

## 2011-12-25 LAB — LIPID PANEL
Cholesterol: 151 mg/dL (ref 0–200)
LDL Cholesterol: 86 mg/dL (ref 0–99)
Triglycerides: 51 mg/dL (ref 0.0–149.0)

## 2011-12-25 MED ORDER — OMEPRAZOLE 20 MG PO CPDR: 20.0000 mg | DELAYED_RELEASE_CAPSULE | Freq: Every day | ORAL | Status: DC

## 2011-12-25 NOTE — Assessment & Plan Note (Signed)
Blood pressure slightly elevated today. Will continue to monitor. We'll check renal function with labs today. We'll continue current medications for now, however if blood pressure consistently greater than 140/90, would favor adding beta blocker.. Patient will followup in 6 weeks.

## 2011-12-25 NOTE — Assessment & Plan Note (Signed)
Patient reports history of right inguinal hernia. I am not able to palpate this on exam today. Will get records on evaluation an ultrasound performed at [facility]. We'll plan to set up evaluation by general surgery.
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
## 2011-12-25 NOTE — Assessment & Plan Note (Signed)
Patient with intermittent epigastric pain and occasionally vomiting after eating. Will set up with GI for evaluation. Question if he would benefit from upper GI with small bowel follow-through and/or upper endoscopy. Will check for H. pylori with labs. Will start on omeprazole.

## 2012-01-29 ENCOUNTER — Encounter: Payer: Medicare Other | Admitting: Internal Medicine

## 2012-02-09 ENCOUNTER — Telehealth: Payer: Self-pay | Admitting: Internal Medicine

## 2012-02-09 ENCOUNTER — Encounter: Payer: Self-pay | Admitting: Internal Medicine

## 2012-02-09 ENCOUNTER — Ambulatory Visit (INDEPENDENT_AMBULATORY_CARE_PROVIDER_SITE_OTHER): Payer: Medicare Other | Admitting: Internal Medicine

## 2012-02-09 VITALS — BP 122/90 | HR 100 | Temp 98.4°F | Ht 72.0 in | Wt 181.5 lb

## 2012-02-09 DIAGNOSIS — R1013 Epigastric pain: Secondary | ICD-10-CM

## 2012-02-09 DIAGNOSIS — K4090 Unilateral inguinal hernia, without obstruction or gangrene, not specified as recurrent: Secondary | ICD-10-CM

## 2012-02-09 DIAGNOSIS — J40 Bronchitis, not specified as acute or chronic: Secondary | ICD-10-CM

## 2012-02-09 MED ORDER — PREDNISONE (PAK) 10 MG PO TABS: ORAL_TABLET | ORAL | Status: AC

## 2012-02-09 MED ORDER — LEVOFLOXACIN 750 MG PO TABS: 750.0000 mg | ORAL_TABLET | Freq: Every day | ORAL | Status: AC

## 2012-02-09 NOTE — Assessment & Plan Note (Signed)
Persistent symptoms. Pt has not yet seen general surgery, so will look into new appointment.

## 2012-02-09 NOTE — Assessment & Plan Note (Signed)
Symptoms persistent. H. Pylori neg. EGD scheduled with GI tomorrow, however pt with URI so will need to postpone.

## 2012-02-09 NOTE — Assessment & Plan Note (Signed)
Symptoms consistent with bronchitis. Pt declined to have CXR ordered here, would like to see allergist first. Will call in Levaquin and Prednisone taper. Continue albuterol. Follow up prn.

## 2012-02-09 NOTE — Telephone Encounter (Signed)
Pt stated that dr walker wanted him to cancel his appointment with dr Jarold Motto gi tomorrow 02/10/12 @ 2. Also at check out dr walker was asking about another referral Please check - was appointment ever scheduled with Gen Surgery - referal in 6/27

## 2012-02-09 NOTE — Telephone Encounter (Signed)
Patient's appointment has been canceled

## 2012-02-09 NOTE — Progress Notes (Signed)
Subjective:    Patient ID: David Rhodes, male    DOB: Dec 03, 1955, 56 y.o.   MRN: [id number]  HPI 56 year old male with history of hypertension and chronic pain presents for followup. At his last visit, he was concerned about epigastric pain. GI evaluation was ordered but this has not yet been completed. Testing for H. pylori was negative. He reports that symptoms are fairly well-controlled with Prilosec.  At his last visit he was also concerned about right inguinal hernia. Referral to surgery was made with this also has not been completed. He reports persistent right-sided lower abdominal pain.  His primary concern today is approximately three-week history of shortness of breath, cough productive of white sputum, and nasal congestion. He was planning to see his ENT physician today to discuss this. He denies any fever or chills. He denies chest pain. He has been using an albuterol inhaler with no improvement.  Outpatient Prescriptions Prior to Visit  Medication Sig Dispense Refill  . amLODipine (NORVASC) 10 MG tablet Take 10 mg by mouth daily.      . fexofenadine (ALLEGRA) 180 MG tablet Take 180 mg by mouth daily.      Marland Kitchen gabapentin (NEURONTIN) 600 MG tablet Take 600 mg by mouth 3 (three) times daily.      Marland Kitchen lisinopril (PRINIVIL,ZESTRIL) 40 MG tablet Take 40 mg by mouth daily.      . montelukast (SINGULAIR) 10 MG tablet Take 10 mg by mouth at bedtime.      Marland Kitchen omeprazole (PRILOSEC) 20 MG capsule Take 1 capsule (20 mg total) by mouth daily.  30 capsule  3  . rOPINIRole (REQUIP) 2 MG tablet Take 2 mg by mouth 2 (two) times daily.       BP 122/90  Pulse 100  Temp 98.4 F (36.9 C) (Oral)  Ht 6' (1.829 m)  Wt 181 lb 8 oz (82.328 kg)  BMI 24.62 kg/m2  SpO2 97%  Review of Systems  Constitutional: Negative for fever, chills, activity change, appetite change, fatigue and unexpected weight change.  HENT: Positive for congestion, rhinorrhea and postnasal drip. Negative for trouble swallowing,  voice change and sinus pressure.   Eyes: Negative for visual disturbance.  Respiratory: Positive for cough, shortness of breath and wheezing.   Cardiovascular: Negative for chest pain, palpitations and leg swelling.  Gastrointestinal: Positive for abdominal pain. Negative for abdominal distention.  Genitourinary: Negative for dysuria, urgency and difficulty urinating.  Musculoskeletal: Negative for arthralgias and gait problem.  Skin: Negative for color change and rash.  Hematological: Negative for adenopathy.  Psychiatric/Behavioral: Negative for disturbed wake/sleep cycle and dysphoric mood. The patient is not nervous/anxious.        Objective:   Physical Exam  Constitutional: He is oriented to person, place, and time. He appears well-developed and well-nourished. No distress.  HENT:  Head: Normocephalic and atraumatic.  Right Ear: External ear normal.  Left Ear: External ear normal.  Nose: Nose normal.  Mouth/Throat: Oropharynx is clear and moist. No oropharyngeal exudate.  Eyes: Conjunctivae and EOM are normal. Pupils are equal, round, and reactive to light. Right eye exhibits no discharge. Left eye exhibits no discharge. No scleral icterus.  Neck: Normal range of motion. Neck supple. No tracheal deviation present. No thyromegaly present.  Cardiovascular: Normal rate, regular rhythm and normal heart sounds.  Exam reveals no gallop and no friction rub.   No murmur heard. Pulmonary/Chest: No accessory muscle usage. Tachypnea noted. No respiratory distress. He has decreased breath sounds (prolonged exp). He has  no wheezes. He has rhonchi (scattered). He has no rales. He exhibits no tenderness.  Musculoskeletal: Normal range of motion. He exhibits no edema.  Lymphadenopathy:    He has no cervical adenopathy.  Neurological: He is alert and oriented to person, place, and time. No cranial nerve deficit. Coordination normal.  Skin: Skin is warm and dry. No rash noted. He is not  diaphoretic. No erythema. No pallor.  Psychiatric: He has a normal mood and affect. His behavior is normal. Judgment and thought content normal.          Assessment & Plan:

## 2012-02-09 NOTE — Telephone Encounter (Signed)
Left patient a message to return my call he had an appointment scheduled with a general surgeon 7.2013.

## 2012-02-10 ENCOUNTER — Ambulatory Visit: Payer: Medicare Other | Admitting: Gastroenterology

## 2012-03-10 ENCOUNTER — Ambulatory Visit: Payer: Medicare Other | Admitting: Internal Medicine

## 2012-04-01 ENCOUNTER — Ambulatory Visit: Payer: Medicare Other | Admitting: Internal Medicine

## 2012-06-24 ENCOUNTER — Ambulatory Visit: Payer: Self-pay | Admitting: Medical

## 2012-06-26 ENCOUNTER — Emergency Department: Payer: Self-pay | Admitting: Unknown Physician Specialty

## 2012-06-26 LAB — CBC
HGB: 16.4 g/dL (ref 13.0–18.0)
Platelet: 183 10*3/uL (ref 150–440)
RBC: 5.47 10*6/uL (ref 4.40–5.90)
RDW: 13.1 % (ref 11.5–14.5)
WBC: 12.6 10*3/uL — ABNORMAL HIGH (ref 3.8–10.6)

## 2012-06-26 LAB — BASIC METABOLIC PANEL
Anion Gap: 8 (ref 7–16)
Calcium, Total: 9.4 mg/dL (ref 8.5–10.1)
Chloride: 106 mmol/L (ref 98–107)
Co2: 24 mmol/L (ref 21–32)
EGFR (African American): >60
Glucose: 105 mg/dL — ABNORMAL HIGH (ref 65–99)
Sodium: 138 mmol/L (ref 136–145)

## 2012-06-26 LAB — CK TOTAL AND CKMB (NOT AT ARMC): CK-MB: <0.5 ng/mL — ABNORMAL LOW (ref 0.5–3.6)

## 2012-07-02 LAB — CULTURE, BLOOD (SINGLE)

## 2013-06-24 ENCOUNTER — Ambulatory Visit: Payer: Self-pay | Admitting: Physician Assistant

## 2013-11-03 LAB — PSA: PSA: 0.3

## 2013-12-01 ENCOUNTER — Ambulatory Visit (INDEPENDENT_AMBULATORY_CARE_PROVIDER_SITE_OTHER): Payer: Medicare Other | Admitting: General Surgery

## 2013-12-22 ENCOUNTER — Encounter (INDEPENDENT_AMBULATORY_CARE_PROVIDER_SITE_OTHER): Payer: Self-pay

## 2013-12-22 ENCOUNTER — Ambulatory Visit (INDEPENDENT_AMBULATORY_CARE_PROVIDER_SITE_OTHER): Payer: Medicare Other | Admitting: General Surgery

## 2013-12-22 ENCOUNTER — Encounter (INDEPENDENT_AMBULATORY_CARE_PROVIDER_SITE_OTHER): Payer: Self-pay | Admitting: General Surgery

## 2013-12-22 VITALS — BP 170/100 | HR 80 | Resp 16 | Ht 72.0 in | Wt 175.2 lb

## 2013-12-22 DIAGNOSIS — K4021 Bilateral inguinal hernia, without obstruction or gangrene, recurrent: Secondary | ICD-10-CM
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
## 2014-01-12 ENCOUNTER — Telehealth (INDEPENDENT_AMBULATORY_CARE_PROVIDER_SITE_OTHER): Payer: Self-pay | Admitting: General Surgery

## 2014-01-12 NOTE — Telephone Encounter (Signed)
01/12/14 10:31am, I returned pt's call and had to leave him a message, I let him know we still have NOT received a clearance letter from Dr. Fredrich BirksStern's office regarding his brain stim.I let the pt know that we have faxed a letter requesting clearance twice, emailed, and I personally called Dr. Fredrich BirksStern's office to see what the hold up was. I spoke with Haileigh and Dr. Venetia MaxonStern is not in the office until 7/20. Neysa BonitoChristy is calling Dr. Fredrich BirksStern's again today. I gave him my name and direct # to call back. We have to wait on this letter before proceeding with surgery. skm

## 2014-01-19 ENCOUNTER — Telehealth (INDEPENDENT_AMBULATORY_CARE_PROVIDER_SITE_OTHER): Payer: Self-pay | Admitting: General Surgery

## 2014-01-19 NOTE — Telephone Encounter (Signed)
I wanted all of us to be on the same page regarding this patient. I called Dr. Fredrich BirksStern's office today at 3:00pm. I explained to the receptionist we have been waiting on clearance for brain stimulator to proceed with surgery for a month now. She put me on hold and spoke with Dr. Fredrich BirksStern's receptionist BurdenHaley. Rolly SalterHaley told her that this is in review on Dr. Rush FarmerSterns desk. He has been on vacation for the past 3 weeks and hopefully they can get this to us soon. I told her that this was not acceptable and I would like Rolly SalterHaley to call me back. I talked to GlensideHaley 2 weeks ago and she told me this would be done by now. I will call Mr. Hyacinth MeekerMiller back again and explain that they still have not given us the Clearance. skm

## 2014-02-01 ENCOUNTER — Encounter (HOSPITAL_COMMUNITY): Payer: Self-pay

## 2014-02-01 ENCOUNTER — Other Ambulatory Visit (HOSPITAL_COMMUNITY): Payer: Medicare Other

## 2014-02-01 ENCOUNTER — Encounter (HOSPITAL_COMMUNITY)
Admission: RE | Admit: 2014-02-01 | Discharge: 2014-02-01 | Disposition: A | Discharge status: Home / Self Care | Payer: Medicare Other | Source: Ambulatory Visit | Attending: Anesthesiology | Admitting: Anesthesiology

## 2014-02-01 ENCOUNTER — Encounter (HOSPITAL_COMMUNITY)
Admission: RE | Admit: 2014-02-01 | Discharge: 2014-02-01 | Disposition: A | Discharge status: Home / Self Care | Payer: Medicare Other | Source: Ambulatory Visit | Attending: General Surgery | Admitting: General Surgery

## 2014-02-01 DIAGNOSIS — Z01812 Encounter for preprocedural laboratory examination: Secondary | ICD-10-CM | POA: Insufficient documentation

## 2014-02-01 DIAGNOSIS — Z01810 Encounter for preprocedural cardiovascular examination: Secondary | ICD-10-CM | POA: Insufficient documentation

## 2014-02-01 DIAGNOSIS — Z01818 Encounter for other preprocedural examination: Secondary | ICD-10-CM | POA: Diagnosis not present

## 2014-02-01 DIAGNOSIS — K4090 Unilateral inguinal hernia, without obstruction or gangrene, not specified as recurrent: Secondary | ICD-10-CM | POA: Insufficient documentation

## 2014-02-01 HISTORY — DX: Presence of spectacles and contact lenses: Z97.3

## 2014-02-01 HISTORY — DX: Unilateral inguinal hernia, without obstruction or gangrene, not specified as recurrent: K40.90

## 2014-02-01 LAB — BASIC METABOLIC PANEL
ANION GAP: 10 (ref 5–15)
BUN: 10 mg/dL (ref 6–23)
CALCIUM: 9.7 mg/dL (ref 8.4–10.5)
CO2: 25 meq/L (ref 19–32)
Chloride: 107 mEq/L (ref 96–112)
Creatinine, Ser: 0.94 mg/dL (ref 0.50–1.35)
GFR calc non Af Amer: >90 mL/min (ref >90)
Glucose, Bld: 70 mg/dL (ref 70–99)
Potassium: 4.3 mEq/L (ref 3.7–5.3)
SODIUM: 142 meq/L (ref 137–147)

## 2014-02-01 LAB — CBC
HEMATOCRIT: 47.2 % (ref 39.0–52.0)
Hemoglobin: 15.8 g/dL (ref 13.0–17.0)
MCH: 30.4 pg (ref 26.0–34.0)
MCHC: 33.5 g/dL (ref 30.0–36.0)
MCV: 90.8 fL (ref 78.0–100.0)
PLATELETS: 206 10*3/uL (ref 150–400)
RBC: 5.2 MIL/uL (ref 4.22–5.81)
RDW: 13.1 % (ref 11.5–15.5)
WBC: 9.6 10*3/uL (ref 4.0–10.5)

## 2014-02-01 NOTE — Pre-Procedure Instructions (Signed)
David Rhodes  02/01/2014   Your procedure is scheduled ZO:XWRUEAVon:Tuesday, February 07, 2014 at 1:15 PM  [facility] 11:15 AM.  Call this number if you have problems the morning of surgery: [phone]   Remember:   Do not eat food or drink liquids after midnight Monday, February 06, 2014   Take these medicines the morning of surgery with A SIP OF WATER: amLODipine (NORVASC),  fexofenadine (ALLEGRA), gabapentin (NEURONTIN),  rOPINIRole (REQUIP)  Stop taking Aspirin, vitamins, and herbal medications. Do not take any NSAIDs ie: Ibuprofen, Advil, Naproxen or any medication containing Aspirin; stop now.  Do not wear jewelry, make-up or nail polish.  Do not wear lotions, powders, or perfumes. You may wear deodorant.  Do not shave 48 hours prior to surgery. Men may shave face and neck.  Do not bring valuables to the hospital.  [facility] is not responsible for any belongings or valuables.               Contacts, dentures or bridgework may not be worn into surgery.  Leave suitcase in the car. After surgery it may be brought to your room.  For patients admitted to the hospital, discharge time is determined by your treatment team.               Patients discharged the day of surgery will not be allowed to drive home.  Name and phone number of your driver:   Special Instructions:  Special Instructions:Special Instructions: [facility] - Preparing [facility]  Before surgery, you can play an important role.  Because skin is not sterile, your skin needs to be as free of germs as possible.  You can reduce the number of germs on you skin by washing with CHG (chlorahexidine gluconate) soap before surgery.  CHG is an antiseptic cleaner which kills germs and bonds with the skin to continue killing germs even after washing.  Please DO NOT use if you have an allergy to CHG or antibacterial soaps.  If your skin becomes reddened/irritated stop using the CHG and inform your nurse when  you arrive at Short Stay.  Do not shave (including legs and underarms) for at least 48 hours prior to the first CHG shower.  You may shave your face.  Please follow these instructions carefully:   1.  Shower with CHG Soap the night before surgery and the morning of Surgery.  2.  If you choose to wash your hair, wash your hair first as usual with your normal shampoo.  3.  After you shampoo, rinse your hair and body thoroughly to remove the Shampoo.  4.  Use CHG as you would any other liquid soap.  You can apply chg directly  to the skin and wash gently with scrungie or a clean washcloth.  5.  Apply the CHG Soap to your body ONLY FROM THE NECK DOWN.  Do not use on open wounds or open sores.  Avoid contact with your eyes, ears, mouth and genitals (private parts).  Wash genitals (private parts) with your normal soap.  6.  Wash thoroughly, paying special attention to the area where your surgery will be performed.  7.  Thoroughly rinse your body with warm water from the neck down.  8.  DO NOT shower/wash with your normal soap after using and rinsing off the CHG Soap.  9.  Pat yourself dry with a clean towel.  10.  Wear clean pajamas.            11.  Place clean sheets on your bed the night of your first shower and do not sleep with pets.  Day of Surgery  Do not apply any lotions the morning of surgery.  Please wear clean clothes to the hospital/surgery center.   Please read over the following fact sheets that you were given: Pain Booklet, Coughing and Deep Breathing and Surgical Site Infection Prevention

## 2014-02-01 NOTE — Progress Notes (Signed)
Pt currently denies SOB, chest pain, and being under the care of a cardiologist. Pt denies having a chest x ray and EKG within the last year. Pt denies having a stress , echo, and cardiac cath done.

## 2014-02-06 MED ORDER — CHLORHEXIDINE GLUCONATE 4 % EX LIQD
1.0000 "application " | Freq: Once | CUTANEOUS | Status: DC
  Filled 2014-02-06: qty 15

## 2014-02-06 MED ORDER — VANCOMYCIN HCL IN DEXTROSE 1-5 GM/200ML-% IV SOLN
1000.0000 mg | INTRAVENOUS | Status: AC
  Administered 2014-02-07: 1000 mg via INTRAVENOUS
  Filled 2014-02-06: qty 200

## 2014-02-07 ENCOUNTER — Ambulatory Visit (HOSPITAL_COMMUNITY): Payer: Medicare Other | Admitting: Anesthesiology

## 2014-02-07 ENCOUNTER — Encounter (HOSPITAL_COMMUNITY): Payer: Self-pay | Admitting: *Deleted

## 2014-02-07 ENCOUNTER — Encounter (HOSPITAL_COMMUNITY)
Admission: RE | Disposition: A | Discharge status: Home / Self Care | Payer: Self-pay | Source: Ambulatory Visit | Attending: General Surgery

## 2014-02-07 ENCOUNTER — Ambulatory Visit (HOSPITAL_COMMUNITY)
Admission: RE | Admit: 2014-02-07 | Discharge: 2014-02-07 | Disposition: A | Discharge status: Home / Self Care | Payer: Medicare Other | Source: Ambulatory Visit | Attending: General Surgery | Admitting: General Surgery

## 2014-02-07 ENCOUNTER — Encounter (HOSPITAL_COMMUNITY): Payer: Medicare Other | Admitting: Anesthesiology

## 2014-02-07 DIAGNOSIS — Z803 Family history of malignant neoplasm of breast: Secondary | ICD-10-CM | POA: Diagnosis not present

## 2014-02-07 DIAGNOSIS — R32 Unspecified urinary incontinence: Secondary | ICD-10-CM | POA: Diagnosis not present

## 2014-02-07 DIAGNOSIS — K4020 Bilateral inguinal hernia, without obstruction or gangrene, not specified as recurrent: Secondary | ICD-10-CM | POA: Diagnosis present

## 2014-02-07 DIAGNOSIS — F3289 Other specified depressive episodes: Secondary | ICD-10-CM | POA: Diagnosis not present

## 2014-02-07 DIAGNOSIS — F411 Generalized anxiety disorder: Secondary | ICD-10-CM | POA: Diagnosis not present

## 2014-02-07 DIAGNOSIS — Z8042 Family history of malignant neoplasm of prostate: Secondary | ICD-10-CM | POA: Insufficient documentation

## 2014-02-07 DIAGNOSIS — G8929 Other chronic pain: Secondary | ICD-10-CM | POA: Insufficient documentation

## 2014-02-07 DIAGNOSIS — F329 Major depressive disorder, single episode, unspecified: Secondary | ICD-10-CM | POA: Diagnosis not present

## 2014-02-07 DIAGNOSIS — I10 Essential (primary) hypertension: Secondary | ICD-10-CM | POA: Insufficient documentation

## 2014-02-07 DIAGNOSIS — K219 Gastro-esophageal reflux disease without esophagitis: Secondary | ICD-10-CM | POA: Diagnosis not present

## 2014-02-07 HISTORY — PX: INSERTION OF MESH: SHX5868

## 2014-02-07 HISTORY — PX: INGUINAL HERNIA REPAIR: SHX194

## 2014-02-07 SURGERY — REPAIR, HERNIA, INGUINAL, BILATERAL, ADULT
Anesthesia: General | Site: Groin | Laterality: Bilateral

## 2014-02-07 MED ORDER — PROMETHAZINE HCL 25 MG/ML IJ SOLN: 6.2500 mg | INTRAMUSCULAR | Status: DC | PRN

## 2014-02-07 MED ORDER — FENTANYL CITRATE 0.05 MG/ML IJ SOLN
INTRAMUSCULAR | Status: DC | PRN
  Administered 2014-02-07 (×2): 50 ug via INTRAVENOUS

## 2014-02-07 MED ORDER — LIDOCAINE HCL (CARDIAC) 20 MG/ML IV SOLN
INTRAVENOUS | Status: AC
  Filled 2014-02-07: qty 10

## 2014-02-07 MED ORDER — DEXAMETHASONE SODIUM PHOSPHATE 4 MG/ML IJ SOLN
INTRAMUSCULAR | Status: DC | PRN
  Administered 2014-02-07: 4 mg via INTRAVENOUS

## 2014-02-07 MED ORDER — OXYCODONE-ACETAMINOPHEN 5-325 MG PO TABS: 1.0000 | ORAL_TABLET | ORAL | Status: DC | PRN

## 2014-02-07 MED ORDER — OXYCODONE HCL 5 MG PO TABS
5.0000 mg | ORAL_TABLET | ORAL | Status: DC | PRN
  Administered 2014-02-07: 10 mg via ORAL

## 2014-02-07 MED ORDER — OXYCODONE HCL 5 MG PO TABS
ORAL_TABLET | ORAL | Status: AC
  Filled 2014-02-07: qty 2

## 2014-02-07 MED ORDER — MIDAZOLAM HCL 5 MG/5ML IJ SOLN
INTRAMUSCULAR | Status: DC | PRN
  Administered 2014-02-07: 2 mg via INTRAVENOUS

## 2014-02-07 MED ORDER — MIDAZOLAM HCL 2 MG/2ML IJ SOLN
INTRAMUSCULAR | Status: AC
  Filled 2014-02-07: qty 2

## 2014-02-07 MED ORDER — DIPHENHYDRAMINE HCL 50 MG/ML IJ SOLN
INTRAMUSCULAR | Status: DC | PRN
  Administered 2014-02-07: 6.25 mg via INTRAVENOUS

## 2014-02-07 MED ORDER — PROPOFOL 10 MG/ML IV BOLUS
INTRAVENOUS | Status: AC
  Filled 2014-02-07: qty 20

## 2014-02-07 MED ORDER — SCOPOLAMINE 1 MG/3DAYS TD PT72
MEDICATED_PATCH | TRANSDERMAL | Status: DC | PRN
  Administered 2014-02-07: 1 via TRANSDERMAL

## 2014-02-07 MED ORDER — SODIUM CHLORIDE 0.9 % IJ SOLN: 3.0000 mL | Freq: Two times a day (BID) | INTRAMUSCULAR | Status: DC

## 2014-02-07 MED ORDER — MEPERIDINE HCL 25 MG/ML IJ SOLN: 6.2500 mg | INTRAMUSCULAR | Status: DC | PRN

## 2014-02-07 MED ORDER — LACTATED RINGERS IV SOLN
INTRAVENOUS | Status: DC
  Administered 2014-02-07 (×2): via INTRAVENOUS

## 2014-02-07 MED ORDER — 0.9 % SODIUM CHLORIDE (POUR BTL) OPTIME
TOPICAL | Status: DC | PRN
  Administered 2014-02-07: 1000 mL

## 2014-02-07 MED ORDER — ONDANSETRON HCL 4 MG/2ML IJ SOLN
INTRAMUSCULAR | Status: DC | PRN
  Administered 2014-02-07: 4 mg via INTRAVENOUS

## 2014-02-07 MED ORDER — SODIUM CHLORIDE 0.9 % IJ SOLN: 3.0000 mL | INTRAMUSCULAR | Status: DC | PRN

## 2014-02-07 MED ORDER — PROPOFOL 10 MG/ML IV BOLUS
INTRAVENOUS | Status: DC | PRN
  Administered 2014-02-07: 150 mg via INTRAVENOUS

## 2014-02-07 MED ORDER — ACETAMINOPHEN 325 MG PO TABS
650.0000 mg | ORAL_TABLET | ORAL | Status: DC | PRN
  Filled 2014-02-07: qty 2

## 2014-02-07 MED ORDER — DEXMEDETOMIDINE HCL 200 MCG/2ML IV SOLN
INTRAVENOUS | Status: DC | PRN
  Administered 2014-02-07: 20 ug via INTRAVENOUS

## 2014-02-07 MED ORDER — SODIUM CHLORIDE 0.9 % IV SOLN: 250.0000 mL | INTRAVENOUS | Status: DC | PRN

## 2014-02-07 MED ORDER — ROCURONIUM BROMIDE 50 MG/5ML IV SOLN
INTRAVENOUS | Status: AC
  Filled 2014-02-07: qty 1

## 2014-02-07 MED ORDER — FENTANYL CITRATE 0.05 MG/ML IJ SOLN
INTRAMUSCULAR | Status: AC
  Filled 2014-02-07: qty 5

## 2014-02-07 MED ORDER — ACETAMINOPHEN 650 MG RE SUPP
650.0000 mg | RECTAL | Status: DC | PRN
  Filled 2014-02-07: qty 1

## 2014-02-07 MED ORDER — LIDOCAINE HCL (CARDIAC) 20 MG/ML IV SOLN
INTRAVENOUS | Status: DC | PRN
  Administered 2014-02-07: 50 mg via INTRAVENOUS

## 2014-02-07 MED ORDER — EPHEDRINE SULFATE 50 MG/ML IJ SOLN
INTRAMUSCULAR | Status: DC | PRN
  Administered 2014-02-07: 5 mg via INTRAVENOUS

## 2014-02-07 MED ORDER — BUPIVACAINE HCL 0.25 % IJ SOLN
INTRAMUSCULAR | Status: DC | PRN
  Administered 2014-02-07: 30 mL

## 2014-02-07 MED ORDER — FENTANYL CITRATE 0.05 MG/ML IJ SOLN: 25.0000 ug | INTRAMUSCULAR | Status: DC | PRN

## 2014-02-07 SURGICAL SUPPLY — 59 items
ADH SKN CLS APL DERMABOND .7 (GAUZE/BANDAGES/DRESSINGS) ×1
BLADE SURG 10 STRL SS (BLADE) ×2 IMPLANT
BLADE SURG 15 STRL LF DISP TIS (BLADE) IMPLANT
BLADE SURG 15 STRL SS (BLADE) ×3
BLADE SURG ROTATE 9660 (MISCELLANEOUS) ×2 IMPLANT
CANISTER SUCTION 2500CC (MISCELLANEOUS) ×2 IMPLANT
CHLORAPREP W/TINT 26ML (MISCELLANEOUS) ×3 IMPLANT
COVER SURGICAL LIGHT HANDLE (MISCELLANEOUS) ×3 IMPLANT
DERMABOND ADVANCED (GAUZE/BANDAGES/DRESSINGS) ×2
DERMABOND ADVANCED .7 DNX12 (GAUZE/BANDAGES/DRESSINGS) ×2 IMPLANT
DRAIN PENROSE 1/2X12 LTX STRL (WOUND CARE) ×2 IMPLANT
DRAPE LAPAROTOMY TRNSV 102X78 (DRAPE) ×3 IMPLANT
DRAPE UTILITY 15X26 W/TAPE STR (DRAPE) ×6 IMPLANT
ELECT CAUTERY BLADE 6.4 (BLADE) ×3 IMPLANT
ELECT REM PT RETURN 9FT ADLT (ELECTROSURGICAL) ×3
ELECTRODE REM PT RTRN 9FT ADLT (ELECTROSURGICAL) ×1 IMPLANT
GAUZE SPONGE 4X4 16PLY XRAY LF (GAUZE/BANDAGES/DRESSINGS) ×2 IMPLANT
GLOVE BIO SURGEON STRL SZ 6.5 (GLOVE) ×1 IMPLANT
GLOVE BIO SURGEON STRL SZ7.5 (GLOVE) ×7 IMPLANT
GLOVE BIO SURGEONS STRL SZ 6.5 (GLOVE) ×1
GLOVE BIOGEL PI IND STRL 7.0 (GLOVE) IMPLANT
GLOVE BIOGEL PI IND STRL 8 (GLOVE) ×1 IMPLANT
GLOVE BIOGEL PI INDICATOR 7.0 (GLOVE) ×2
GLOVE BIOGEL PI INDICATOR 8 (GLOVE) ×2
GLOVE SURG SS PI 7.0 STRL IVOR (GLOVE) ×2 IMPLANT
GOWN STRL REUS W/ TWL LRG LVL3 (GOWN DISPOSABLE) ×1 IMPLANT
GOWN STRL REUS W/ TWL XL LVL3 (GOWN DISPOSABLE) ×1 IMPLANT
GOWN STRL REUS W/TWL LRG LVL3 (GOWN DISPOSABLE) ×6
GOWN STRL REUS W/TWL XL LVL3 (GOWN DISPOSABLE) ×3
KIT BASIN OR (CUSTOM PROCEDURE TRAY) ×3 IMPLANT
KIT ROOM TURNOVER OR (KITS) ×3 IMPLANT
MESH PARIETEX PROGRIP LEFT (Mesh General) ×2 IMPLANT
MESH PARIETEX PROGRIP RIGHT (Mesh General) ×2 IMPLANT
NDL HYPO 25GX1X1/2 BEV (NEEDLE) ×1 IMPLANT
NEEDLE HYPO 25GX1X1/2 BEV (NEEDLE) ×3 IMPLANT
NS IRRIG 1000ML POUR BTL (IV SOLUTION) ×3 IMPLANT
PACK SURGICAL SETUP 50X90 (CUSTOM PROCEDURE TRAY) ×3 IMPLANT
PAD ARMBOARD 7.5X6 YLW CONV (MISCELLANEOUS) ×3 IMPLANT
PENCIL BUTTON HOLSTER BLD 10FT (ELECTRODE) ×3 IMPLANT
SPONGE INTESTINAL PEANUT (DISPOSABLE) ×2 IMPLANT
SPONGE LAP 18X18 X RAY DECT (DISPOSABLE) ×5 IMPLANT
SUT MNCRL AB 4-0 PS2 18 (SUTURE) ×6 IMPLANT
SUT PROLENE 2 0 CT2 30 (SUTURE) ×12 IMPLANT
SUT SILK 2 0 (SUTURE) ×6
SUT SILK 2 0 SH (SUTURE) ×4 IMPLANT
SUT SILK 2-0 18XBRD TIE 12 (SUTURE) ×2 IMPLANT
SUT VIC AB 2-0 CT1 27 (SUTURE) ×12
SUT VIC AB 2-0 CT1 TAPERPNT 27 (SUTURE) ×2 IMPLANT
SUT VIC AB 3-0 SH 27 (SUTURE) ×9
SUT VIC AB 3-0 SH 27XBRD (SUTURE) ×2 IMPLANT
SUT VICRYL AB 2 0 TIES (SUTURE) ×3 IMPLANT
SYR BULB 3OZ (MISCELLANEOUS) ×3 IMPLANT
SYR CONTROL 10ML LL (SYRINGE) ×3 IMPLANT
TOWEL OR 17X24 6PK STRL BLUE (TOWEL DISPOSABLE) ×1 IMPLANT
TOWEL OR 17X26 10 PK STRL BLUE (TOWEL DISPOSABLE) ×3 IMPLANT
TRAY FOLEY CATH 16FR SILVER (SET/KITS/TRAYS/PACK) ×2 IMPLANT
TUBE CONNECTING 12'X1/4 (SUCTIONS) ×1
TUBE CONNECTING 12X1/4 (SUCTIONS) ×1 IMPLANT
YANKAUER SUCT BULB TIP NO VENT (SUCTIONS) ×2 IMPLANT

## 2014-02-07 NOTE — Transfer of Care (Signed)
Immediate Anesthesia Transfer of Care Note  Patient: David BienenstockRonald E Rhodes  Procedure(s) Performed: Procedure(s):  BILATERAL INGUINAL HERNIA REPAIR (Bilateral) INSERTION OF MESH (Bilateral)  Patient Location: PACU  Anesthesia Type:General  Level of Consciousness: awake, alert  and patient cooperative  Airway & Oxygen Therapy: Patient Spontanous Breathing  Post-op Assessment: Report given to PACU RN and Post -op Vital signs reviewed and stable  Post vital signs: Reviewed and stable  Complications: No apparent anesthesia complications

## 2014-02-07 NOTE — Anesthesia Preprocedure Evaluation (Addendum)
Anesthesia Evaluation  Patient identified by MRN, date of birth, ID band Patient awake    Reviewed: Allergy & Precautions, H&P , NPO status , Patient's Chart, lab work & pertinent test results, reviewed documented beta blocker date and time   Airway Mallampati: III TM Distance: >3 FB Neck ROM: Full    Dental  (+) Edentulous Lower, Edentulous Upper   Pulmonary  breath sounds clear to auscultation        Cardiovascular hypertension, Pt. on medications Rhythm:Regular Rate:Normal     Neuro/Psych    GI/Hepatic   Endo/Other    Renal/GU      Musculoskeletal   Abdominal (+)  Abdomen: soft.    Peds  Hematology   Anesthesia Other Findings Full beard  Reproductive/Obstetrics                         Anesthesia Physical Anesthesia Plan  ASA: III  Anesthesia Plan: General   Post-op Pain Management:    Induction: Intravenous  Airway Management Planned: LMA  Additional Equipment:   Intra-op Plan:   Post-operative Plan: Extubation in OR  Informed Consent: I have reviewed the patients History and Physical, chart, labs and discussed the procedure including the risks, benefits and alternatives for the proposed anesthesia with the patient or authorized representative who has indicated his/her understanding and acceptance.     Plan Discussed with:   Anesthesia Plan Comments:         Anesthesia Quick Evaluation

## 2014-02-07 NOTE — Progress Notes (Signed)
Called Short stay for the 2nd time. Shalo RN states that the RN is busy and will have to call me back.

## 2014-02-07 NOTE — Progress Notes (Signed)
Pt has tolerated sprite and crackers and pain medication without N or V / Good relief from pain med. Has voided x2.  Pt states he is ready to go home. Discharged to home with family. They state understanding of verbal and written discharge instructions

## 2014-02-07 NOTE — Anesthesia Procedure Notes (Signed)
Procedure Name: LMA Insertion Date/Time: 02/07/2014 1:03 PM Performed by: Arlice ColtMANESS, Armando Bukhari B Pre-anesthesia Checklist: Patient identified, Emergency Drugs available, Suction available, Patient being monitored and Timeout performed Patient Re-evaluated:Patient Re-evaluated prior to inductionOxygen Delivery Method: Circle system utilized Preoxygenation: Pre-oxygenation with 100% oxygen Intubation Type: IV induction LMA: LMA inserted LMA Size: 4.0 Number of attempts: 1 Placement Confirmation: positive ETCO2 and breath sounds checked- equal and bilateral Tube secured with: Tape Dental Injury: Teeth and Oropharynx as per pre-operative assessment

## 2014-02-07 NOTE — Anesthesia Postprocedure Evaluation (Signed)
  Anesthesia Post-op Note  Patient: Bradly Bienenstockonald E Meals  Procedure(s) Performed: Procedure(s):  BILATERAL INGUINAL HERNIA REPAIR (Bilateral) INSERTION OF MESH (Bilateral)  Patient Location: PACU  Anesthesia Type:General  Level of Consciousness: awake and alert   Airway and Oxygen Therapy: Patient Spontanous Breathing  Post-op Pain: mild  Post-op Assessment: Post-op Vital signs reviewed and Patient's Cardiovascular Status Stable  Post-op Vital Signs: stable  Last Vitals:  Filed Vitals:   02/07/14 1603  BP:   Pulse:   Temp: 36.4 C  Resp:     Complications: No apparent anesthesia complications

## 2014-02-07 NOTE — Op Note (Signed)
02/07/2014  1:59 PM  PATIENT:  David Rhodes  58 y.o. male  PRE-OPERATIVE DIAGNOSIS:  bilateral inguinal hernia  POST-OPERATIVE DIAGNOSIS:  bilateral inguinal hernia  PROCEDURE:  Procedure(s):  BILATERAL DIRECT INGUINAL HERNIA REPAIR (Bilateral) INSERTION OF MESH (Bilateral)  SURGEON:  Surgeon(s) and Role:    * Axel FillerArmando Delwin Raczkowski, MD - Primary   ASSISTANTS: Norm SaltMary Stuckey, Pa-student   ANESTHESIA:   local and general  EBL:  Total I/O In: 1000 [I.V.:1000] Out: 250 [Urine:250]  BLOOD ADMINISTERED:none  DRAINS: none   LOCAL MEDICATIONS USED:  BUPIVICAINE   SPECIMEN:  No Specimen  DISPOSITION OF SPECIMEN:  N/A  COUNTS:  YES  TOURNIQUET:  * No tourniquets in log *  DICTATION: .Dragon Dictation Details of the procedure: The patient was taken back to the operating room. The patient was placed in supine position with bilateral SCDs in place. After appropriate anitbiotics were confirmed, a time-out was confirmed and all facts were verified.  Quarter percent Marcaine was used to infiltrate the area of the incision and an ilioinguinal nerve block was also placed. A 5 cm incision was made just 1 cm superior to the inguinal ligament. Bovie cautery was used to maintain hemostasis dissection is carried down to the external oblique.  A standard incision was made laterally, and the external oblique was bluntly dissected away from the surrounding tissue with Metzenbaum scissors. Incision was made through the external oblique and lateral stab wound through the external ring. The external oblique was elevated in the spermatic cord was bluntly dissected away from the surrounding tissue.  The ilioinguinal nerve was identified and ligated proximally with a 2-0 vicryl. The spermatic cord and the hernia were then bluntly dissected away from the pubic tubercle and a Penrose was placed around the hernia sac in the spermatic cord. Dissection cremasterics to place a Bovie cautery. The hernia sac was  encountered and seen to be direct.  The sac was not opened. Secondary to the bulging of the direct hernia a the hernia florr was reapproximated with a 2-0 Vicryl in a running fashion. At this time a right sided Progrip mesh was then anchored to the pubic tubercle with a 2-0 Prolene x3 in an interrupted fashion.  It was anchored to the shelving edge of the external obliquex1 medially.  The superior edge was fixated to the conjoint tendon x1 as was the mesh wrap around the cord.  The tails were then cut into shape and tucked under the external oblique. The mesh surrounding the spermatic cord at the internal oblique was then tightened without undue tenion on the cord. At this time the area was irrigated out with sterile saline.  The external oblique was reapproximated using a 2-0 Vicryl in a running fashion. Scarpa's fascia was then reapproximated using a 3-0 Vicryl running fashion. The skin was then reapproximated with 4 Monocryl in a subcuticular fashion.   The exact same procedure took place on the left side.  There was also a left sided direct hernia.  The floor of the hernia was also imbricated to keep the hernia sac out of the wound.  A right sided Progrip mesh was place and anchored in the exact same fashion.  The external oblique, Scarpa's fascia and the skin were all reapproximated in the exact same fashion.  The skin was then dressed with Dermabond.  The patient was taken to the recovery room in stable condition.   PLAN OF CARE: Discharge to home after PACU  PATIENT DISPOSITION:  PACU - hemodynamically  stable.   Delay start of Pharmacological VTE agent (>24hrs) due to surgical blood loss or risk of bleeding: not applicable

## 2014-02-07 NOTE — Discharge Instructions (Signed)
[facility] _______[facility] ° °INGUINAL HERNIA REPAIR: POST OP INSTRUCTIONS ° °Always review your discharge instruction sheet given to you by the facility where your surgery was performed. °IF YOU HAVE DISABILITY OR FAMILY LEAVE FORMS, YOU MUST BRING THEM TO THE OFFICE FOR PROCESSING.   °DO NOT GIVE THEM TO YOUR DOCTOR. ° °1. A  prescription for pain medication may be given to you upon discharge.  Take your pain medication as prescribed, if needed.  If narcotic pain medicine is not needed, then you may take acetaminophen (Tylenol) or ibuprofen (Advil) as needed. °2. Take your usually prescribed medications unless otherwise directed. °3. If you need a refill on your pain medication, please contact your pharmacy.  They will contact our office to request authorization. Prescriptions will not be filled after 5 pm or on week-ends. °4. You should follow a light diet the first 24 hours after arrival home, such as soup and crackers, etc.  Be sure to include lots of fluids daily.  Resume your normal diet the day after surgery. °5. Most patients will experience some swelling and bruising around the umbilicus or in the groin and scrotum.  Ice packs and reclining will help.  Swelling and bruising can take several days to resolve.  °6. It is common to experience some constipation if taking pain medication after surgery.  Increasing fluid intake and taking a stool softener (such as Colace) will usually help or prevent this problem from occurring.  A mild laxative (Milk of Magnesia or Miralax) should be taken according to package directions if there are no bowel movements after 48 hours. °7. Unless discharge instructions indicate otherwise, you may remove your bandages 24-48 hours after surgery, and you may shower at that time.  You may have steri-strips (small skin tapes) in place directly over the incision.  These strips should be left on the skin for 7-10 days.  If your surgeon used skin glue on the incision, you  may shower in 24 hours.  The glue will flake off over the next 2-3 weeks.  Any sutures or staples will be removed at the office during your follow-up visit. °8. ACTIVITIES:  You may resume regular (light) daily activities beginning the next day--such as daily self-care, walking, climbing stairs--gradually increasing activities as tolerated.  You may have sexual intercourse when it is comfortable.  Refrain from any heavy lifting or straining until approved by your doctor. °a. You may drive when you are no longer taking prescription pain medication, you can comfortably wear a seatbelt, and you can safely maneuver your car and apply brakes. °b. RETURN TO WORK:  __________________________________________________________ °9. You should see your doctor in the office for a follow-up appointment approximately 2-3 weeks after your surgery.  Make sure that you call for this appointment within a day or two after you arrive home to insure a convenient appointment time. °10. OTHER INSTRUCTIONS:  __________________________________________________________________________________________________________________________________________________________________________________________  °WHEN TO CALL YOUR DOCTOR: °1. Fever over 101.0 °2. Inability to urinate °3. Nausea and/or vomiting °4. Extreme swelling or bruising °5. Continued bleeding from incision. °6. Increased pain, redness, or drainage from the incision ° °The clinic staff is available to answer your questions during regular business hours.  Please don’t hesitate to call and ask to speak to one of the nurses for clinical concerns.  If you have a medical emergency, go to the nearest emergency room or call 911.  A surgeon from [facility] is always on call at the hospital ° ° °[address], [address], [place], [place]  [address] ? ° [address] [address], [place], [place]   [address] °[phone] ? [phone] ? FAX [phone] °Web site:  [email or web address] ° ° °What to eat: ° °For your first meals, you should eat lightly; only small meals initially.  If you do not have nausea, you may eat larger meals.  Avoid spicy, greasy and heavy food.   ° °General Anesthesia, Adult, Care After  °Refer to this sheet in the next few weeks. These instructions provide you with information on caring for yourself after your procedure. Your health care provider may also give you more specific instructions. Your treatment has been planned according to current medical practices, but problems sometimes occur. Call your health care provider if you have any problems or questions after your procedure.  °WHAT TO EXPECT AFTER THE PROCEDURE  °After the procedure, it is typical to experience:  °Sleepiness.  °Nausea and vomiting. °HOME CARE INSTRUCTIONS  °For the first 24 hours after general anesthesia:  °Have a responsible person with you.  °Do not drive a car. If you are alone, do not take public transportation.  °Do not drink alcohol.  °Do not take medicine that has not been prescribed by your health care provider.  °Do not sign important papers or make important decisions.  °You may resume a normal diet and activities as directed by your health care provider.  °Change bandages (dressings) as directed.  °If you have questions or problems that seem related to general anesthesia, call the hospital and ask for the anesthetist or anesthesiologist on call. °SEEK MEDICAL CARE IF:  °You have nausea and vomiting that continue the day after anesthesia.  °You develop a rash. °SEEK IMMEDIATE MEDICAL CARE IF:  °You have difficulty breathing.  °You have chest pain.  °You have any allergic problems. °Document Released: 09/22/2000 Document Revised: 02/16/2013 Document Reviewed: 12/30/2012  °ExitCare® Patient Information ©2014 [employer].  ° ° °

## 2014-02-07 NOTE — H&P (Signed)
HPI  David Rhodes is a 58 y.o. male. The patient is a 58 year old male who is referred by Dr. Dan HumphreysWalker for evaluation for bilateral inguinal hernias. 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  The patient is a child on these hernias have been there approximately 2 years. He states that he has some pain to the right inguinal area mainly. He notices a bulge that is not reducible.  HPI  Past Medical History   Diagnosis  Date   .  Hypertension    .  Anxiety    .  Depression    .  Shortness of breath      when allergies flare up   .  Pneumonia      many years ago   .  GERD (gastroesophageal reflux disease)    .  Headache(784.0)    .  Neuromuscular disorder      hx of tremors   .  Chronic pain due to trauma    .  Allergy      Hay fever   .  Urine incontinence     Past Surgical History   Procedure  Laterality  Date   .  Nasal sinus surgery     .  Deep brain stimulator placement     .  Subthalamic stimulator battery replacement   12/09/2011     Procedure: SUBTHALAMIC STIMULATOR BATTERY REPLACEMENT; Surgeon: Maeola HarmanJoseph Stern, MD; Location: [facility]; Service: Neurosurgery; Laterality: N/A; deep brain stimulator, implantable pulse generator change    Family History   Problem  Relation  Age of Onset   .  Myoclonus  Mother    .  Cancer  Mother      breast   .  Cancer  Father      prostate   .  Myoclonus  Maternal Uncle    .  Heart disease  Paternal Grandfather    Social History  History   Substance Use Topics   .  Smoking status:  Never Smoker   .  Smokeless tobacco:  Current User   .  Alcohol Use:  No    Allergies   Allergen  Reactions   .  Penicillins  Shortness Of Breath    Current Outpatient Prescriptions   Medication  Sig  Dispense  Refill   .  amLODipine (NORVASC) 10 MG tablet  Take 10 mg by mouth daily.     .  fexofenadine (ALLEGRA) 180 MG tablet  Take 180 mg by mouth daily.     Marland Kitchen.  gabapentin (NEURONTIN) 600 MG tablet  Take 600 mg  by mouth 3 (three) times daily.     Marland Kitchen.  lisinopril (PRINIVIL,ZESTRIL) 40 MG tablet  Take 40 mg by mouth daily.     .  montelukast (SINGULAIR) 10 MG tablet  Take 10 mg by mouth at bedtime.     Marland Kitchen.  omeprazole (PRILOSEC) 20 MG capsule  Take 1 capsule (20 mg total) by mouth daily.  30 capsule  3   .  rOPINIRole (REQUIP) 2 MG tablet  Take 2 mg by mouth 2 (two) times daily.      No current facility-administered medications for this visit.   Review of Systems  Review of Systems  Constitutional: Negative.  HENT: Negative.  Eyes: Negative.  Respiratory: Negative.  Cardiovascular: Negative.  Gastrointestinal: Negative.  Endocrine: Negative.  Neurological: Negative.  Blood pressure 170/100, pulse 80, resp. rate 16, height 6' (1.829 m), weight 175  lb 3.2 oz (79.47 kg).  Physical Exam  Physical Exam  Constitutional: He is oriented to person, place, and time. He appears well-developed and well-nourished.  HENT:  Head: Normocephalic and atraumatic.  Eyes: Conjunctivae and EOM are normal. Pupils are equal, round, and reactive to light.  Neck: Normal range of motion. Neck supple.  Cardiovascular: Normal rate, regular rhythm and normal heart sounds.  Pulmonary/Chest: Effort normal and breath sounds normal.  Abdominal: Soft. Bowel sounds are normal. A hernia is present. Hernia confirmed positive in the right inguinal area and confirmed positive in the left inguinal area.  R>L  Musculoskeletal: Normal range of motion.  Neurological: He is alert and oriented to person, place, and time.  Skin: Skin is warm and dry.  Data Reviewed  none  Assessment  58 year old male with bilateral inguinal hernias, right greater than left  Plan  1. The patient has been cleared by Dr. Venetia Maxon in regards to his brain stimulator 2. We will proceed for an open bilateral inguinal hernia repair with mesh  3. All risks and benefits were discussed with the patient, to generally include infection, bleeding, damage to surrounding  structures, acute and chronic nerve pain, and recurrence. Alternatives were offered and described. All questions were answered and the patient voiced understanding of the procedure and wishes to proceed at this point.

## 2014-02-09 ENCOUNTER — Encounter (HOSPITAL_COMMUNITY): Payer: Self-pay | Admitting: General Surgery

## 2014-02-10 ENCOUNTER — Telehealth (INDEPENDENT_AMBULATORY_CARE_PROVIDER_SITE_OTHER): Payer: Self-pay

## 2014-02-10 NOTE — Telephone Encounter (Signed)
Pt is post op hernia repair on 02/07/14 by Dr. Derrell Lollingamirez.  He is having severe allergies and is coughing and sneezing a lot.  He is in a lot of pain and states the pain medication is not enough.  I strongly urged him to contact his PCP who treats his allergies.  He will need to control the coughing and sneezing to be able to rest to let the pain medication work.  Pt agreed to call his PCP today.

## 2014-02-22 ENCOUNTER — Encounter (INDEPENDENT_AMBULATORY_CARE_PROVIDER_SITE_OTHER): Payer: Medicare Other | Admitting: General Surgery

## 2014-03-29 ENCOUNTER — Ambulatory Visit: Payer: Self-pay | Admitting: Internal Medicine

## 2014-04-18 ENCOUNTER — Other Ambulatory Visit: Payer: Self-pay | Admitting: Neurosurgery

## 2014-04-20 ENCOUNTER — Encounter (HOSPITAL_COMMUNITY): Payer: Self-pay | Admitting: *Deleted

## 2014-04-20 MED ORDER — VANCOMYCIN HCL IN DEXTROSE 1-5 GM/200ML-% IV SOLN: 1000.0000 mg | INTRAVENOUS | Status: DC

## 2014-04-20 NOTE — Progress Notes (Signed)
Pt c/o SOB at times with allergy flare up. Pt denies chest pain and being under the care of a cardiologist. [facility] and cannot recall the name of his new PCP. Pt not sure if he had any cardiac studies done such as a stress test, echo and cardiac cath.

## 2014-04-21 ENCOUNTER — Ambulatory Visit (HOSPITAL_COMMUNITY)
Admission: RE | Admit: 2014-04-21 | Discharge: 2014-04-21 | Disposition: A | Discharge status: Home / Self Care | Payer: Medicare Other | Source: Ambulatory Visit | Attending: Neurosurgery | Admitting: Neurosurgery

## 2014-04-21 ENCOUNTER — Ambulatory Visit (HOSPITAL_COMMUNITY): Payer: Medicare Other | Admitting: Anesthesiology

## 2014-04-21 ENCOUNTER — Encounter (HOSPITAL_COMMUNITY): Payer: Medicare Other | Admitting: Anesthesiology

## 2014-04-21 ENCOUNTER — Encounter (HOSPITAL_COMMUNITY): Payer: Self-pay | Admitting: Anesthesiology

## 2014-04-21 ENCOUNTER — Encounter (HOSPITAL_COMMUNITY)
Admission: RE | Disposition: A | Discharge status: Home / Self Care | Payer: Self-pay | Source: Ambulatory Visit | Attending: Neurosurgery

## 2014-04-21 DIAGNOSIS — Z4542 Encounter for adjustment and management of neuropacemaker (brain) (peripheral nerve) (spinal cord): Secondary | ICD-10-CM | POA: Diagnosis present

## 2014-04-21 DIAGNOSIS — K219 Gastro-esophageal reflux disease without esophagitis: Secondary | ICD-10-CM | POA: Insufficient documentation

## 2014-04-21 DIAGNOSIS — I10 Essential (primary) hypertension: Secondary | ICD-10-CM | POA: Insufficient documentation

## 2014-04-21 DIAGNOSIS — R251 Tremor, unspecified: Secondary | ICD-10-CM | POA: Insufficient documentation

## 2014-04-21 HISTORY — PX: SUBTHALAMIC STIMULATOR BATTERY REPLACEMENT: SHX5405

## 2014-04-21 LAB — CBC
HCT: 45.4 % (ref 39.0–52.0)
HEMOGLOBIN: 15.8 g/dL (ref 13.0–17.0)
MCH: 30.3 pg (ref 26.0–34.0)
MCHC: 34.8 g/dL (ref 30.0–36.0)
MCV: 87 fL (ref 78.0–100.0)
Platelets: 207 10*3/uL (ref 150–400)
RBC: 5.22 MIL/uL (ref 4.22–5.81)
RDW: 12.9 % (ref 11.5–15.5)
WBC: 10.5 10*3/uL (ref 4.0–10.5)

## 2014-04-21 LAB — BASIC METABOLIC PANEL
Anion gap: 10 (ref 5–15)
BUN: 13 mg/dL (ref 6–23)
CHLORIDE: 100 meq/L (ref 96–112)
CO2: 26 meq/L (ref 19–32)
Calcium: 10.1 mg/dL (ref 8.4–10.5)
Creatinine, Ser: 1 mg/dL (ref 0.50–1.35)
GFR calc Af Amer: >90 mL/min (ref >90)
GFR calc non Af Amer: 81 mL/min — ABNORMAL LOW (ref >90)
GLUCOSE: 93 mg/dL (ref 70–99)
Potassium: 4.7 mEq/L (ref 3.7–5.3)
SODIUM: 136 meq/L — AB (ref 137–147)

## 2014-04-21 SURGERY — SUBTHALAMIC STIMULATOR BATTERY REPLACEMENT
Anesthesia: General | Site: Chest | Laterality: Left

## 2014-04-21 MED ORDER — HYDROMORPHONE HCL 1 MG/ML IJ SOLN: 0.2500 mg | INTRAMUSCULAR | Status: DC | PRN

## 2014-04-21 MED ORDER — LACTATED RINGERS IV SOLN
INTRAVENOUS | Status: DC
  Administered 2014-04-21: 11:00:00 via INTRAVENOUS

## 2014-04-21 MED ORDER — 0.9 % SODIUM CHLORIDE (POUR BTL) OPTIME
TOPICAL | Status: DC | PRN
  Administered 2014-04-21: 1000 mL

## 2014-04-21 MED ORDER — VANCOMYCIN HCL 1000 MG IV SOLR
INTRAVENOUS | Status: DC | PRN
  Administered 2014-04-21: 1000 mg via TOPICAL

## 2014-04-21 MED ORDER — ONDANSETRON HCL 4 MG/2ML IJ SOLN: 4.0000 mg | Freq: Once | INTRAMUSCULAR | Status: DC | PRN

## 2014-04-21 MED ORDER — BUPIVACAINE HCL (PF) 0.5 % IJ SOLN
INTRAMUSCULAR | Status: DC | PRN
  Administered 2014-04-21: 20 mL

## 2014-04-21 MED ORDER — VANCOMYCIN HCL 1000 MG IV SOLR
INTRAVENOUS | Status: AC
  Filled 2014-04-21: qty 1000

## 2014-04-21 MED ORDER — LACTATED RINGERS IV SOLN
INTRAVENOUS | Status: DC | PRN
  Administered 2014-04-21: 16:00:00 via INTRAVENOUS

## 2014-04-21 MED ORDER — LIDOCAINE-EPINEPHRINE 1 %-1:100000 IJ SOLN
INTRAMUSCULAR | Status: DC | PRN
  Administered 2014-04-21: 20 mL

## 2014-04-21 MED ORDER — VANCOMYCIN HCL IN DEXTROSE 1-5 GM/200ML-% IV SOLN
INTRAVENOUS | Status: AC
  Administered 2014-04-21: 1000 mg via INTRAVENOUS
  Filled 2014-04-21: qty 200

## 2014-04-21 SURGICAL SUPPLY — 64 items
ADH SKN CLS APL DERMABOND .7 (GAUZE/BANDAGES/DRESSINGS)
ADH SKN CLS LQ APL DERMABOND (GAUZE/BANDAGES/DRESSINGS) ×1
BLADE SURG 10 STRL SS (BLADE) ×3 IMPLANT
BLADE SURG 15 STRL LF DISP TIS (BLADE) ×1 IMPLANT
BLADE SURG 15 STRL SS (BLADE) ×3
CANISTER SUCT 3000ML (MISCELLANEOUS) ×3 IMPLANT
CLOSURE WOUND 1/2 X4 (GAUZE/BANDAGES/DRESSINGS) ×1
CONT SPEC 4OZ CLIKSEAL STRL BL (MISCELLANEOUS) IMPLANT
CORDS BIPOLAR (ELECTRODE) ×3 IMPLANT
DECANTER SPIKE VIAL GLASS SM (MISCELLANEOUS) ×3 IMPLANT
DERMABOND ADHESIVE PROPEN (GAUZE/BANDAGES/DRESSINGS) ×2
DERMABOND ADVANCED (GAUZE/BANDAGES/DRESSINGS)
DERMABOND ADVANCED .7 DNX12 (GAUZE/BANDAGES/DRESSINGS) ×1 IMPLANT
DERMABOND ADVANCED .7 DNX6 (GAUZE/BANDAGES/DRESSINGS) IMPLANT
DRAPE INCISE IOBAN 66X45 STRL (DRAPES) ×3 IMPLANT
DRAPE LAPAROTOMY 100X72 PEDS (DRAPES) ×3 IMPLANT
DRAPE POUCH INSTRU U-SHP 10X18 (DRAPES) ×3 IMPLANT
DRSG OPSITE 4X5.5 SM (GAUZE/BANDAGES/DRESSINGS) IMPLANT
DRSG TELFA 3X8 NADH (GAUZE/BANDAGES/DRESSINGS) IMPLANT
ELECT CAUTERY BLADE 6.4 (BLADE) ×3 IMPLANT
GAUZE SPONGE 4X4 12PLY STRL (GAUZE/BANDAGES/DRESSINGS) IMPLANT
GAUZE SPONGE 4X4 16PLY XRAY LF (GAUZE/BANDAGES/DRESSINGS) ×3 IMPLANT
GLOVE BIO SURGEON STRL SZ8 (GLOVE) ×3 IMPLANT
GLOVE BIOGEL PI IND STRL 7.0 (GLOVE) IMPLANT
GLOVE BIOGEL PI IND STRL 8 (GLOVE) ×1 IMPLANT
GLOVE BIOGEL PI IND STRL 8.5 (GLOVE) ×1 IMPLANT
GLOVE BIOGEL PI INDICATOR 7.0 (GLOVE) ×4
GLOVE BIOGEL PI INDICATOR 8 (GLOVE) ×2
GLOVE BIOGEL PI INDICATOR 8.5 (GLOVE) ×2
GLOVE ECLIPSE 8.0 STRL XLNG CF (GLOVE) ×3 IMPLANT
GLOVE EXAM NITRILE LRG STRL (GLOVE) IMPLANT
GLOVE EXAM NITRILE MD LF STRL (GLOVE) IMPLANT
GLOVE EXAM NITRILE XL STR (GLOVE) IMPLANT
GLOVE EXAM NITRILE XS STR PU (GLOVE) IMPLANT
GLOVE SURG SS PI 7.0 STRL IVOR (GLOVE) ×6 IMPLANT
GOWN STRL REUS W/ TWL LRG LVL3 (GOWN DISPOSABLE) IMPLANT
GOWN STRL REUS W/ TWL XL LVL3 (GOWN DISPOSABLE) ×1 IMPLANT
GOWN STRL REUS W/TWL 2XL LVL3 (GOWN DISPOSABLE) ×3 IMPLANT
GOWN STRL REUS W/TWL LRG LVL3 (GOWN DISPOSABLE) ×6
GOWN STRL REUS W/TWL XL LVL3 (GOWN DISPOSABLE) ×3
KIT BASIN OR (CUSTOM PROCEDURE TRAY) ×3 IMPLANT
KIT ROOM TURNOVER OR (KITS) ×3 IMPLANT
NDL HYPO 25X1 1.5 SAFETY (NEEDLE) ×1 IMPLANT
NEEDLE HYPO 25X1 1.5 SAFETY (NEEDLE) ×3 IMPLANT
NEUROSTIM OCTOPOLAR ~~LOC~~ 49X65 (Neuro Prosthesis/Implant) ×2 IMPLANT
NS IRRIG 1000ML POUR BTL (IV SOLUTION) ×3 IMPLANT
PACK EENT II TURBAN DRAPE (CUSTOM PROCEDURE TRAY) ×3 IMPLANT
PAD ARMBOARD 7.5X6 YLW CONV (MISCELLANEOUS) ×9 IMPLANT
PAD DRESSING TELFA 3X8 NADH (GAUZE/BANDAGES/DRESSINGS) ×1 IMPLANT
PENCIL BUTTON HOLSTER BLD 10FT (ELECTRODE) ×3 IMPLANT
STAPLER SKIN PROX WIDE 3.9 (STAPLE) ×1 IMPLANT
STOCKINETTE 4X48 STRL (DRAPES) ×1 IMPLANT
STRIP CLOSURE SKIN 1/2X4 (GAUZE/BANDAGES/DRESSINGS) ×2 IMPLANT
SUT SILK 2 0 TIES 10X30 (SUTURE) ×1 IMPLANT
SUT VIC AB 2-0 CP2 18 (SUTURE) ×3 IMPLANT
SUT VIC AB 3-0 SH 8-18 (SUTURE) ×3 IMPLANT
SYR BULB 3OZ (MISCELLANEOUS) ×3 IMPLANT
SYR CONTROL 10ML LL (SYRINGE) ×3 IMPLANT
TOWEL OR 17X24 6PK STRL BLUE (TOWEL DISPOSABLE) ×3 IMPLANT
TOWEL OR 17X26 10 PK STRL BLUE (TOWEL DISPOSABLE) ×3 IMPLANT
TUBE CONNECTING 12'X1/4 (SUCTIONS) ×1
TUBE CONNECTING 12X1/4 (SUCTIONS) ×2 IMPLANT
UNDERPAD 30X30 INCONTINENT (UNDERPADS AND DIAPERS) ×1 IMPLANT
WATER STERILE IRR 1000ML POUR (IV SOLUTION) ×3 IMPLANT

## 2014-04-21 NOTE — Progress Notes (Signed)
Pt states he does not have a ride home today.  Dr. Diamantina MonksGreg Smith notified and states he will discuss with Dr. Venetia MaxonStern.

## 2014-04-21 NOTE — H&P (Signed)
>   [address] [place], [place] [id number] Phone: [phone]   Patient ID:   [id number] Patient: David RidgeRonald Rhodes  Date of Birth: 01-12-1956 Visit Type: Office Visit   Date: 04/18/2014 01:15 PM Provider: Danae OrleansJoseph D. Venetia MaxonStern MD   This 58 year old male presents for Deep Brain Stimulator Battery Check.  History of Present Illness: 1.  Deep Brain Stimulator Battery Check  Interrogation of DBS reveals depleted battery.  We interrogated the battery and it is completely depleted.  The patient has significantly increased tremor.  Unable to form words and his voice is affected as well.        PAST MEDICAL/SURGICAL HISTORY   (Detailed)  Disease/disorder Onset Date Management Date Comments    Hernia repair    Hypertension      Tremor          PAST MEDICAL HISTORY, SURGICAL HISTORY, FAMILY HISTORY, SOCIAL HISTORY AND REVIEW OF SYSTEMS I have reviewed the patient's past medical, surgical, family and social history as well as the comprehensive review of systems as included on the [facility] history form dated, which I have signed.  Family History  (Detailed) Patient reports there is no relevant family history.   SOCIAL HISTORY  (Detailed) Tobacco use reviewed. Preferred language is Unknown.   Smoking status: Never smoker.  SMOKING STATUS Use Status Type Smoking Status Usage Per Day Years Used Total Pack Years  no/never  Never smoker       HOME ENVIRONMENT/SAFETY The patient has not fallen in the last year.        MEDICATIONS(added, continued or stopped this visit):   Started Medication Directions Instruction Stopped   gabapentin 600 mg tablet take 1 tablet by oral route 3 times every day     Requip take 1 tablet by oral route  every bedtime     Singulair take 1 tablet by oral route  every day in the evening      ALLERGIES:  Ingredient Reaction Medication Name Comment  PENICILLINS Unknown    Reviewed,  updated.    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  Because it is completely out we will go ahead with an urgent battery replacement.  Completed Orders (this encounter) Order Details Reason Side Interpretation Result Initial Treatment Date Region  Hypertension education Follow up with primary care physician for elevated blood pressure.         Assessment/Plan # Detail Type Description   1. Assessment Essential (primary) hypertension (I10).         Pain Assessment/Treatment Pain Scale: 0/10. Method: Numeric Pain Intensity Scale.  Fall Risk Plan The patient has not fallen in the last year.  Risks and benefits were discussed in detail with the patient and he wishes to proceed with surgery this is scheduled for this coming Friday at 3 p.m.  Orders: Instruction(s)/Education: Assessment Instruction  I10 Hypertension education             Provider:  Danae OrleansJoseph D. Venetia MaxonStern MD  04/19/2014 08:44 AM Dictation edited by: Danae OrleansJoseph D. Venetia MaxonStern    CC Providers: Maeola HarmanJoseph Lizzett Nobile MD [address] [place], [place] [id number] ----------------------------------------------------------------------------------------------------------------------------------------------------------------------         Electronically signed by Danae OrleansJoseph D. Venetia MaxonStern MD on 04/19/2014 08:44 AM

## 2014-04-21 NOTE — Brief Op Note (Signed)
04/21/2014  5:16 PM  PATIENT:  David Rhodes  58 y.o. male  PRE-OPERATIVE DIAGNOSIS:  Tremor, depleted battery  POST-OPERATIVE DIAGNOSIS:  Tremor, depleted battery  PROCEDURE:  Procedure(s) with comments: Deep brain stimulator battery change (Left) - Deep brain stimulator battery change  SURGEON:  Surgeon(s) and Role:    * Kelissa Merlin, MD - Primary  PHYSICIAN ASSISTANT:   ASSISTANTS: Poteat, RN   ANESTHESIA:   local  EBL:     BLOOD ADMINISTERED:none  DRAINS: none   LOCAL MEDICATIONS USED:  LIDOCAINE   SPECIMEN:  No Specimen  DISPOSITION OF SPECIMEN:  N/A  COUNTS:  YES  TOURNIQUET:  * No tourniquets in log *  DICTATION: DICTATION: Patient has implanted thalamic stimulator electrodes and IPG, which is now depleted.  It was elected for patient to undergo IPG revision.  PROCEDURE: Patient was brought to the operating room and given lidocaine local only.  Left upper chest was prepped with betadine scrub and Duraprep.  Area of planned incision was infiltrated with lidocaine.  Prior incision was reopened and the old IPG was externalized.  Adaptor was connected to new IPG which was placed in the pocket.  Wound was irrigated with bacitracin and irrigated with vancomycin. Then irrigated once more.  Incision was closed with 2-0 Vicryl and 3-0 vicryl sutures and dressed with a sterile occlusive dressing.  Impedances were checked and were within range.Counts were correct at the end of the case.  PLAN OF CARE: Discharge to home after PACU  PATIENT DISPOSITION:  PACU - hemodynamically stable.   Delay start of Pharmacological VTE agent (>24hrs) due to surgical blood loss or risk of bleeding: yes  

## 2014-04-21 NOTE — Op Note (Signed)
04/21/2014  5:16 PM  PATIENT:  David Bienenstockonald E Northrop  58 y.o. male  PRE-OPERATIVE DIAGNOSIS:  Tremor, depleted battery  POST-OPERATIVE DIAGNOSIS:  Tremor, depleted battery  PROCEDURE:  Procedure(s) with comments: Deep brain stimulator battery change (Left) - Deep brain stimulator battery change  SURGEON:  Surgeon(s) and Role:    * Maeola HarmanJoseph Lonnell Chaput, MD - Primary  PHYSICIAN ASSISTANT:   ASSISTANTS: Poteat, RN   ANESTHESIA:   local  EBL:     BLOOD ADMINISTERED:none  DRAINS: none   LOCAL MEDICATIONS USED:  LIDOCAINE   SPECIMEN:  No Specimen  DISPOSITION OF SPECIMEN:  N/A  COUNTS:  YES  TOURNIQUET:  * No tourniquets in log *  DICTATION: DICTATION: Patient has implanted thalamic stimulator electrodes and IPG, which is now depleted.  It was elected for patient to undergo IPG revision.  PROCEDURE: Patient was brought to the operating room and given lidocaine local only.  Left upper chest was prepped with betadine scrub and Duraprep.  Area of planned incision was infiltrated with lidocaine.  Prior incision was reopened and the old IPG was externalized.  Adaptor was connected to new IPG which was placed in the pocket.  Wound was irrigated with bacitracin and irrigated with vancomycin. Then irrigated once more.  Incision was closed with 2-0 Vicryl and 3-0 vicryl sutures and dressed with a sterile occlusive dressing.  Impedances were checked and were within range.Counts were correct at the end of the case.  PLAN OF CARE: Discharge to home after PACU  PATIENT DISPOSITION:  PACU - hemodynamically stable.   Delay start of Pharmacological VTE agent (>24hrs) due to surgical blood loss or risk of bleeding: yes

## 2014-04-21 NOTE — Progress Notes (Signed)
Pt. Able to ambulate around unit, drank 2 sprites, IV dced and pt able to dress self., verbalizes dc instructions, Dr Venetia MaxonStern and DR Michelle Piperssey aware pt driving self home, transported by wheelchair to car per Amy, NT

## 2014-04-21 NOTE — Transfer of Care (Signed)
Immediate Anesthesia Transfer of Care Note  Patient: David BienenstockRonald E Hostetter  Procedure(s) Performed: Procedure(s) with comments: Deep brain stimulator battery change (Left) - Deep brain stimulator battery change  Patient Location: PACU  Anesthesia Type:MAC  Level of Consciousness: awake, alert , oriented and patient cooperative  Airway & Oxygen Therapy: Patient Spontanous Breathing  Post-op Assessment: Report given to PACU RN, Post -op Vital signs reviewed and stable and Patient moving all extremities  Post vital signs: Reviewed and stable  Complications: No apparent anesthesia complications

## 2014-04-21 NOTE — Anesthesia Preprocedure Evaluation (Signed)
Anesthesia Evaluation  Patient identified by MRN, date of birth, ID band Patient awake    Reviewed: Allergy & Precautions, H&P , NPO status , Patient's Chart, lab work & pertinent test results  Airway       Dental   Pulmonary          Cardiovascular hypertension,     Neuro/Psych  Headaches,  Neuromuscular disease    GI/Hepatic GERD-  ,  Endo/Other    Renal/GU      Musculoskeletal   Abdominal   Peds  Hematology   Anesthesia Other Findings   Reproductive/Obstetrics                           Anesthesia Physical Anesthesia Plan  ASA: II  Anesthesia Plan:    Post-op Pain Management:    Induction:   Airway Management Planned: Mask and Natural Airway  Additional Equipment:   Intra-op Plan:   Post-operative Plan:   Informed Consent: I have reviewed the patients History and Physical, chart, labs and discussed the procedure including the risks, benefits and alternatives for the proposed anesthesia with the patient or authorized representative who has indicated his/her understanding and acceptance.     Plan Discussed with: CRNA, Anesthesiologist and Surgeon  Anesthesia Plan Comments:         Anesthesia Quick Evaluation

## 2014-04-21 NOTE — Discharge Instructions (Signed)
Pt given printed instructions

## 2014-04-22 NOTE — Anesthesia Postprocedure Evaluation (Signed)
Anesthesia Post Note  Patient: David BienenstockRonald E Mollett  Procedure(s) Performed: Procedure(s) (LRB): Deep brain stimulator battery change (Left)  Anesthesia type: general  Patient location: PACU  Post pain: Pain level controlled  Post assessment: Patient's Cardiovascular Status Stable  Last Vitals:  Filed Vitals:   04/21/14 1813  BP:   Pulse:   Temp: 36.9 C  Resp:     Post vital signs: Reviewed and stable  Level of consciousness: sedated  Complications: No apparent anesthesia complications

## 2014-04-26 ENCOUNTER — Encounter (HOSPITAL_COMMUNITY): Payer: Self-pay | Admitting: Neurosurgery

## 2014-04-27 ENCOUNTER — Telehealth: Payer: Self-pay

## 2014-04-27 NOTE — Telephone Encounter (Signed)
LVM for pt to call back.    RE: scheduling AWV with PA or NP if pt allows.  

## 2014-07-05 ENCOUNTER — Ambulatory Visit: Payer: Self-pay | Admitting: Orthopedic Surgery

## 2014-07-25 ENCOUNTER — Ambulatory Visit: Payer: Self-pay

## 2014-09-30 IMAGING — CR DG CHEST 2V
3 series · 3 of 3 positions shown · non-contrast
Comparison: 06/24/2013

CLINICAL DATA: Cough and congestion for 2 months.

EXAM:
CHEST  2 VIEW

[chest pa (1 of 2)]
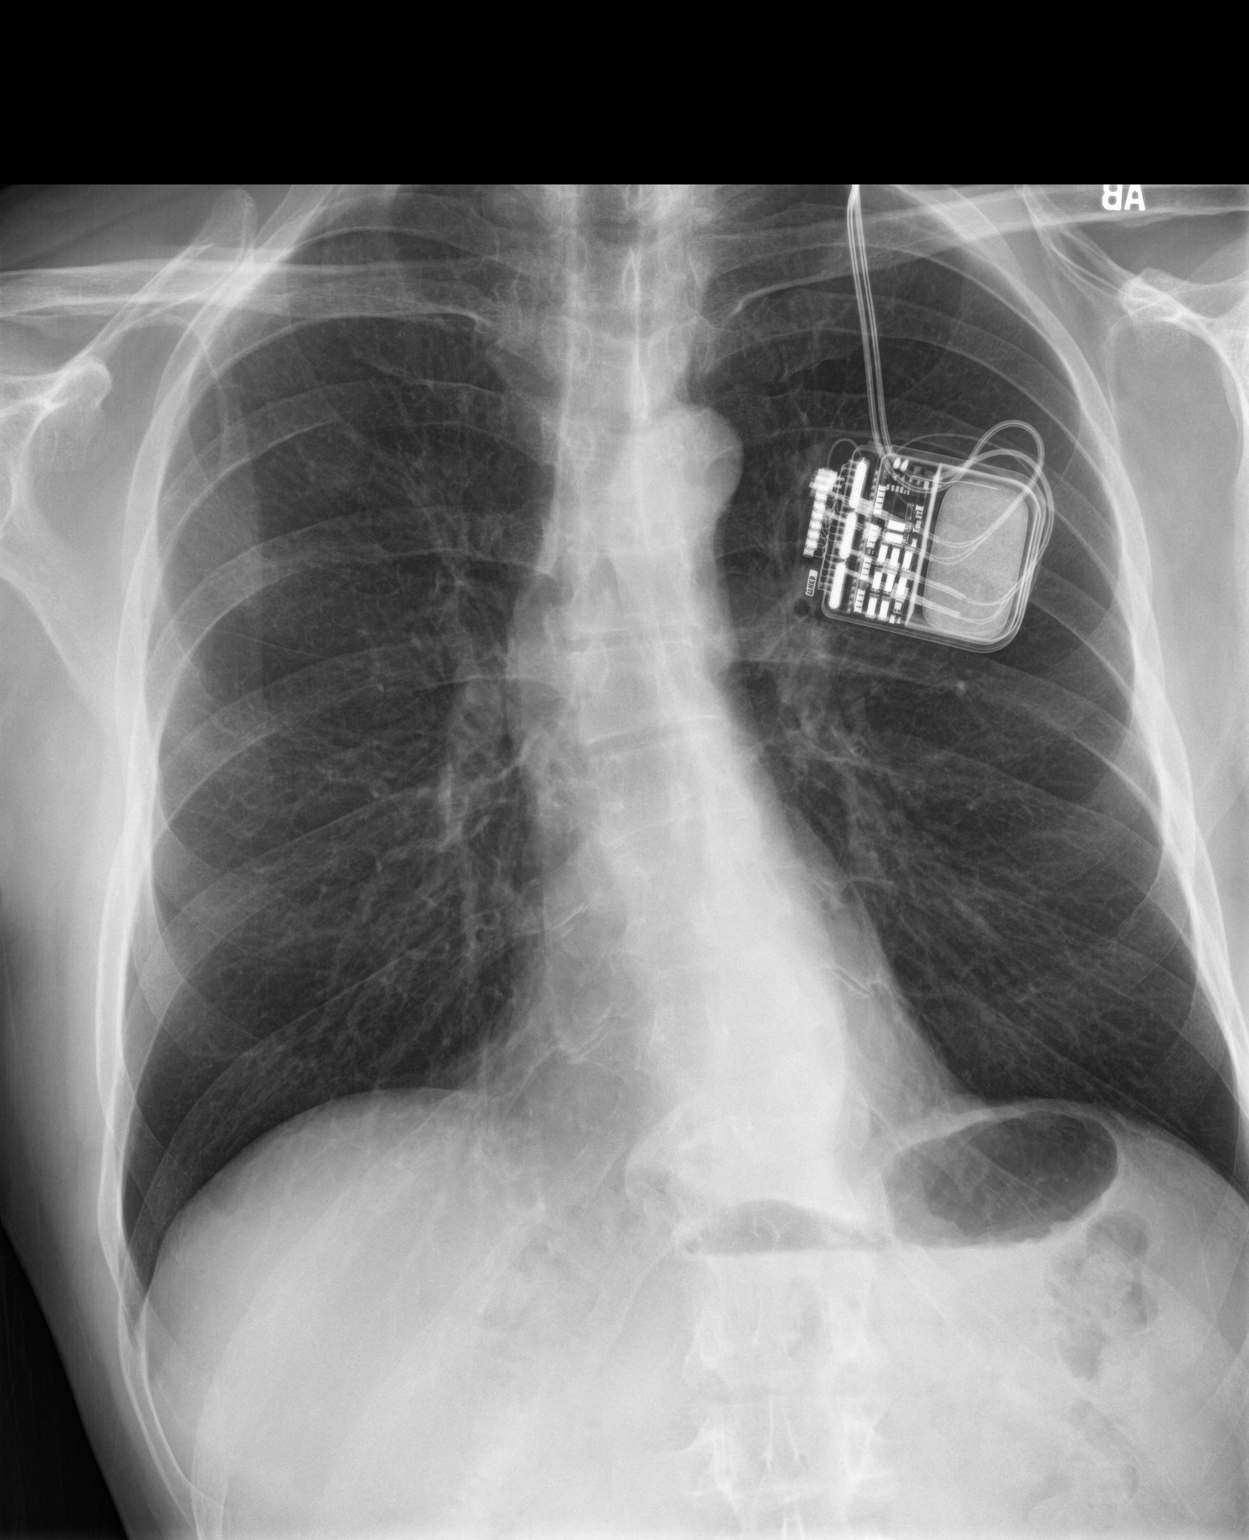

[chest pa (2 of 2)]
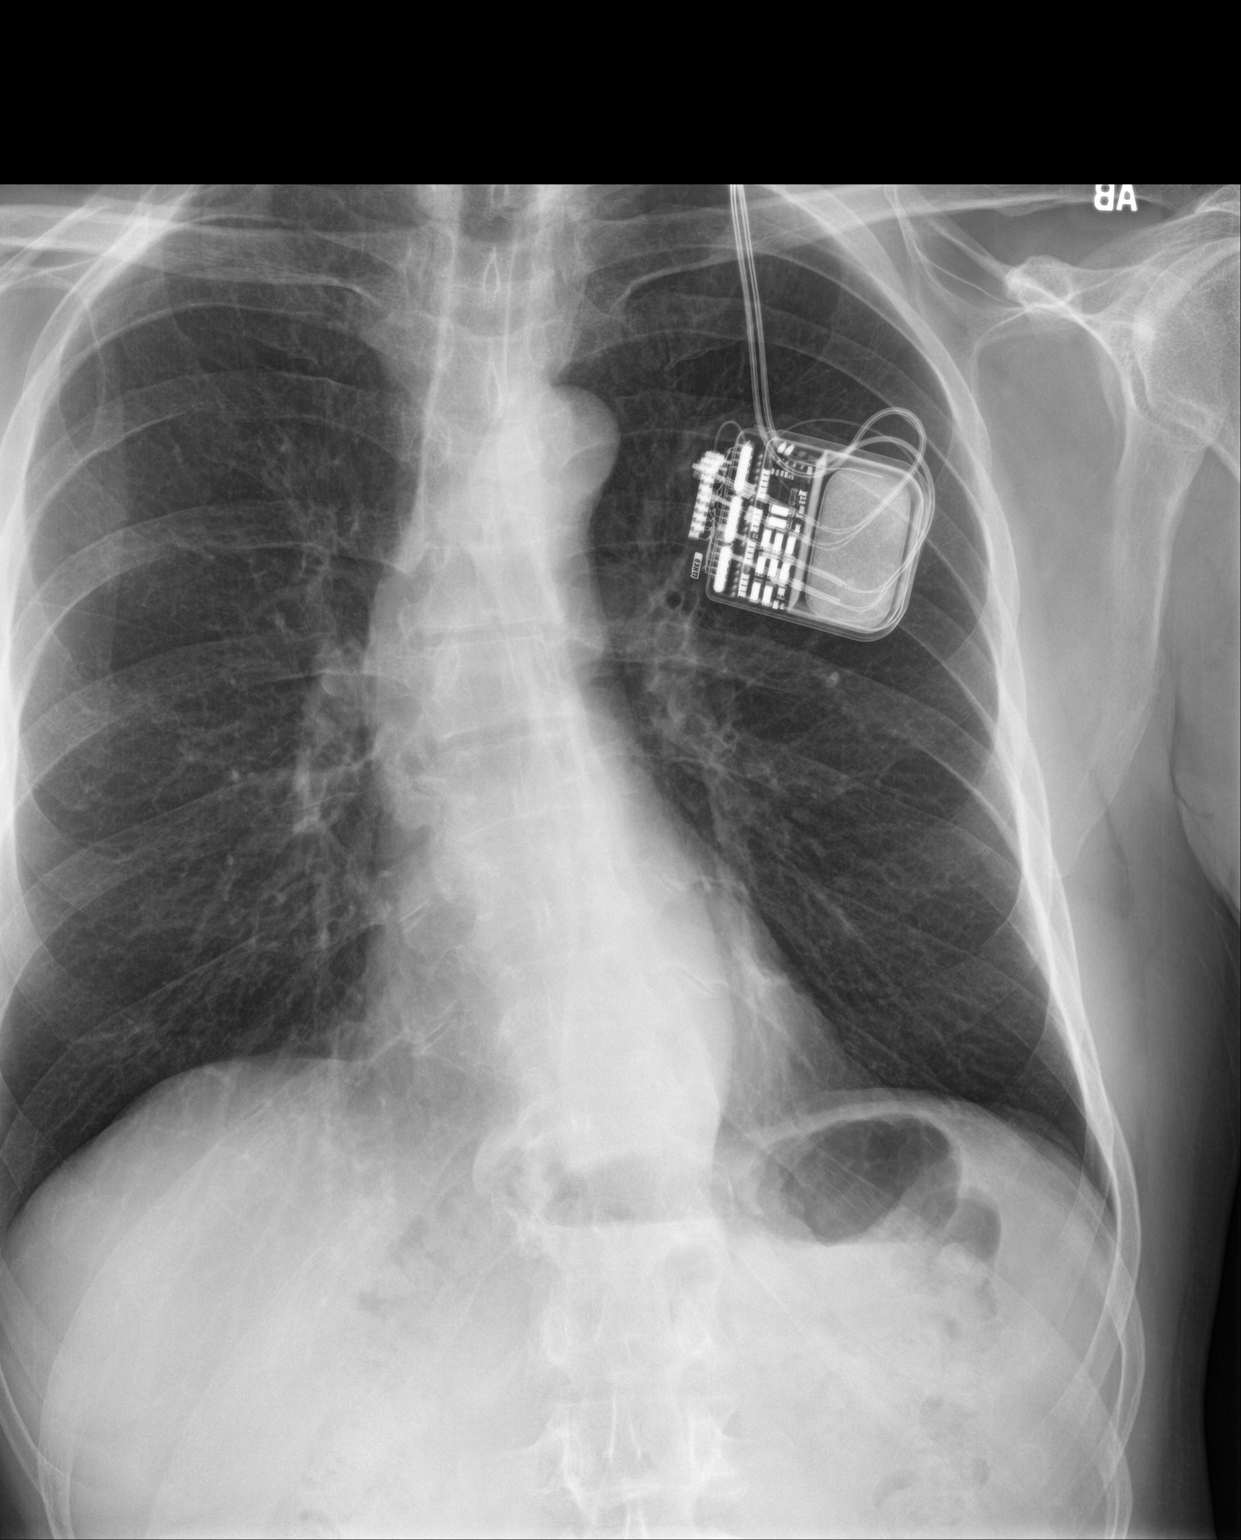

[chest lat]
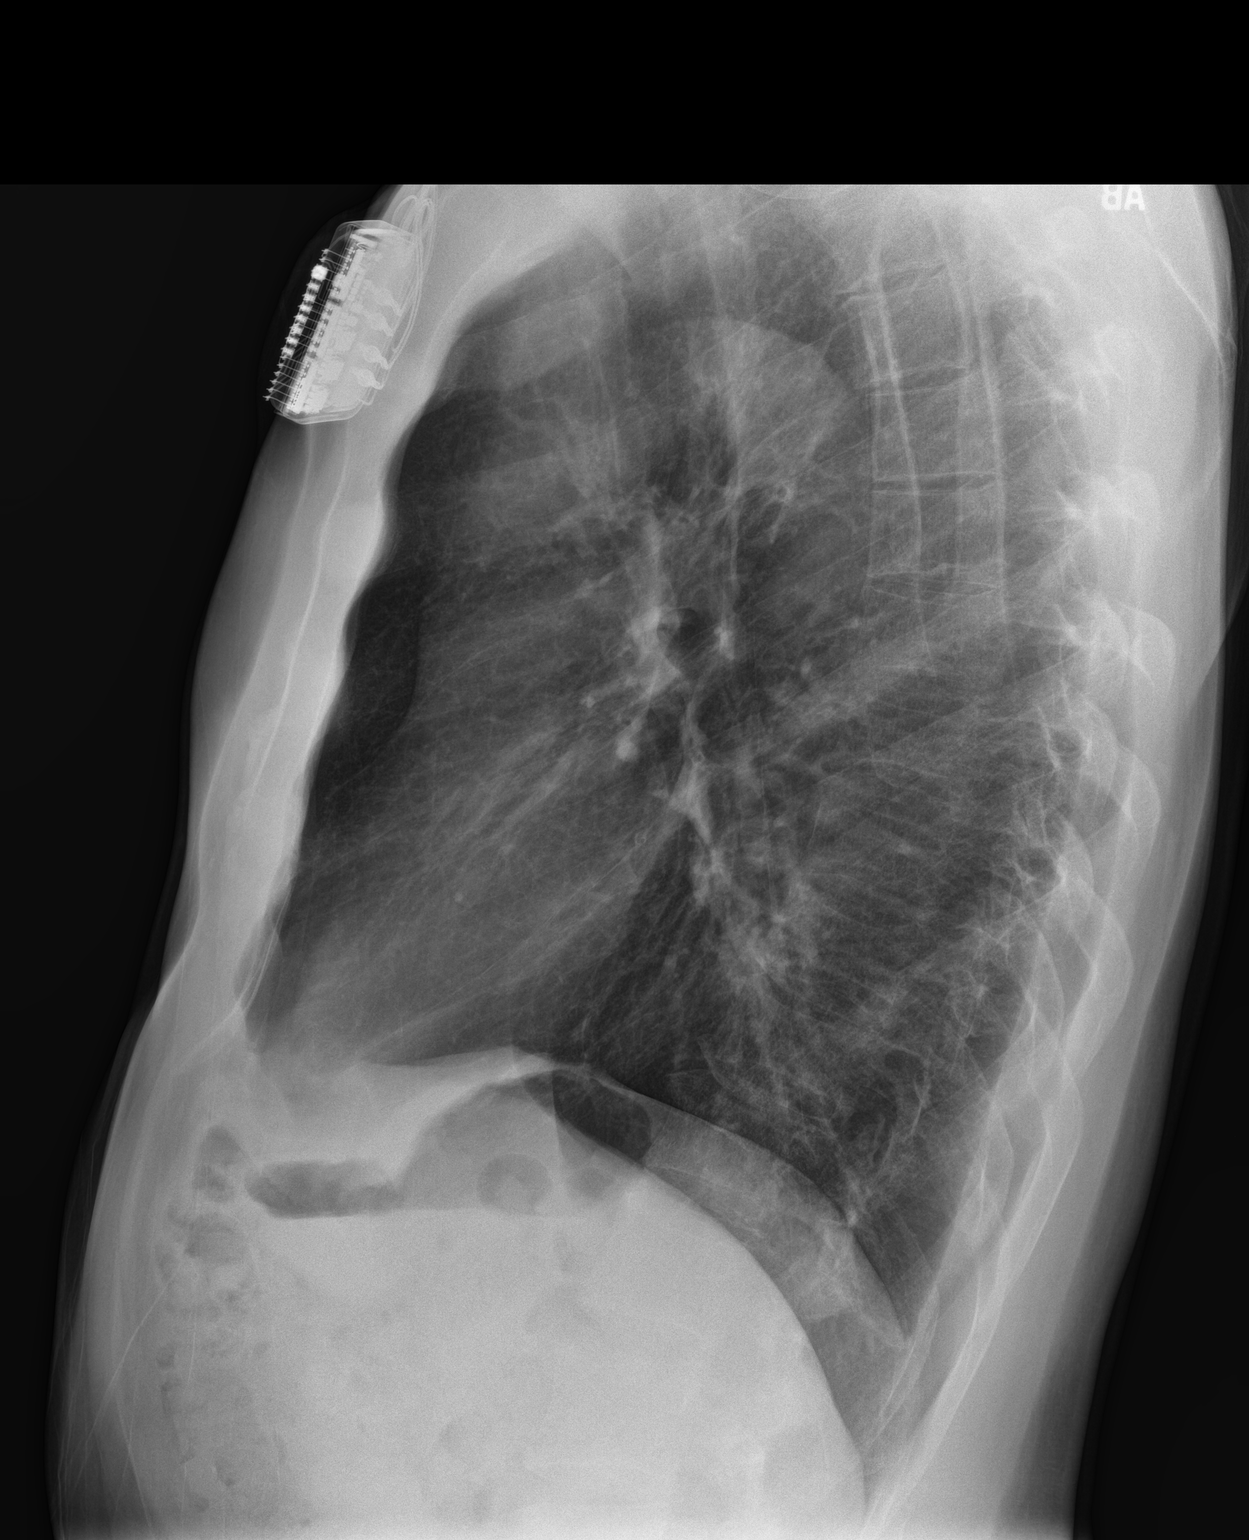

[3 of 3 positions shown; findings below may reference images not displayed]

FINDINGS: Cardiac silhouette is normal in size. Normal mediastinal and hilar
contours.

Lungs are clear.  No pleural effusion or pneumothorax.

Left anterior chest wall stimulator device is stable.

Bony thorax is intact.
IMPRESSION: No active cardiopulmonary disease.

## 2014-10-22 NOTE — Op Note (Signed)
PATIENT NAME:  David Rhodes, David Rhodes MR#:  [id number] DATE OF BIRTH:  1955/12/03  DATE OF PROCEDURE:  12/04/2011  PREOPERATIVE DIAGNOSES:   1. Bilateral nasal polypectomy.  2. 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   POSTOPERATIVE DIAGNOSES:  1. Bilateral nasal polyps.  2. Chronic bilateral ethmoid sinusitis, maxillary sinusitis and frontal sinusitis.   PROCEDURES:    1. Bilateral endoscopic total ethmoidectomy.  2. Bilateral endoscopic maxillary antrostomy with removal of contents.  3. Bilateral endoscopic frontal sinusotomy.  4. Bilateral endoscopic nasal polypectomy.  5. Use of image guided system.   SURGEON: Vernie Murders, M.D.   ANESTHESIA: General.   COMPLICATIONS: None.   DESCRIPTION OF PROCEDURE: The patient is given general anesthesia by oral endotracheal intubation in the supine position on the table. The image guided system is brought in. The CT scan is downloaded from the disk. The template is applied to the face after it is washed with alcohol and then the template is downloaded to the disk as well. There is 0.5 mm of variance. The suction instruments are then registered to this system as well and these are checked for alignment and this appears to be excellent. The patient is prepped and draped in sterile fashion.   The 0 degree endoscope is used for visualizing the left side first. There are polyps that are filling the upper one half of the nasal cavity. There is mostly big watery polyps that are coming from medial and lateral of the middle turbinate. The nose had been prepped with some 1% Xylocaine with epinephrine 1:100,000. Some of this was infiltrated in the polyps, but I was unable to see much of the landmarks because so many polyps to anesthetize well. Some of the polyps were grasped with large forceps and many of these were removed and sent for pathology. The Diego microdebrider was used for starting to remove some of the polypoid tissue as well. The  image guided system was used for evaluating the depth of dissection and the landmarks which were distorted by all the polypoid tissue. With the [facility], I was able to debris the polyps off of the middle turbinate and get down to more normal tissue on the turbinate. There was only a remnant of this left. A part of it had been previously removed. There were polyps throughout the ethmoid sinus and these were removed. There was still a little bit of the uncinate process that was left and this was completely removed. As the maxillary antrum was opened, there were huge polyps noted inside the left maxillary antrum. The 30 degree scope was used and these polyps were removed and cleaned using angled forceps and angled suctions inside. Some of the polyps as they were broken there was thick mucus that was inside these. These were all broken up and the tissue removed. There were some thickened tissue at the superior medial border of the maxillary sinus that had been hidden behind the uncinate process. This was all cleaned out. There was ooze coming from the maxillary sinus, but this settled down. The image guided system was used to carefully follow along the posterior ethmoid and clean all the polyps, open up the air cells here. As they moved anteriorly, there were more polyps, even polyps filling the opening to the frontal sinus duct. I removed those polyps with the 30 degree scope and the angled image guided suction. I was able to track right up into the frontal sinus to make sure that this was clear. This took a  prolonged amount of time and a lot of oozing. Once the area was cleared of all polyps, it was packed with some cottonoid pledgets soaked in Afrin and lidocaine.   The right side was then addressed and again this side was filled up with polyps in the upper half. Some local anesthesia was placed. The polyps were then grasped with some 45 degree forceps and this was used to clean out many of these polyps. Once these  were cleaned out, the middle turbinate again had polypoid changes. The [place] was used to clean around the middle turbinate and allow better visualization in the ethmoid sinus. There were lots of polyps here and the image guided system was used to carefully dissect the depth of dissection to make sure that the polyps were all removed from here. Again, part of the uncinate process was still left. This was removed. There was very thick scarred bony tissue here with the middle turbinate lateralized and scarred down near the maxillary antral opening. This was freed up and the antrum was widened out. Again, there were polypoid disease throughout the maxillary sinus. Using the 30 degrees scope and the angled suction and instruments, these were all cleaned out. There was no further disease in the maxillary sinus. Again, there was some oozing here because of the inflamed tissue. There was oozing through the ethmoid sinus as this was cleared carefully of all polypoid disease and opened up fully.   Anteriorly, the 30 degree scope was used to open up the anterior ethmoid air cells and the opening of the frontal sinus duct, again using the curved image guided suction I could get into the frontal sinus duct without any problems. This was packed with Cottonoid pledgets, soaked with phenylephrine and Xylocaine.   The left side was dressed. The Cottonoid pledgets were removed. There was no significant ooze. This had really settled down. Xerogel was then trimmed and placed into the ethmoid sinus and in to the opening to the maxillary sinus along the middle turbinate remnant. This was all soaked with saline to help soften it. The right side was then re-evaluated. It was suctioned clear. Again, some Xerogel was placed along the ethmoid area followed by some at the opening of the maxillary sinus and then along the middle turbinate remnant. The patient tolerated the procedure well. Total estimated blood loss was 400 mL because of all  the oozing from polyps. There are no operative complications. He was awakened and taken to the recovery room in satisfactory condition.    ____________________________ Cammy CopaPaul H. Cinch Ormond, MD phj:ap D: 12/04/2011 13:05:27 ET T: 12/04/2011 13:29:33 ET JOB#: [id number]  cc: Cammy CopaPaul H. Tyrese Ficek, MD, <Dictator> Cammy CopaPAUL H Shayne Diguglielmo MD ELECTRONICALLY SIGNED 12/08/2011 8:35

## 2014-11-11 ENCOUNTER — Encounter: Payer: Self-pay | Admitting: Internal Medicine

## 2014-11-11 DIAGNOSIS — K219 Gastro-esophageal reflux disease without esophagitis: Secondary | ICD-10-CM | POA: Insufficient documentation

## 2014-11-11 DIAGNOSIS — K4090 Unilateral inguinal hernia, without obstruction or gangrene, not specified as recurrent: Secondary | ICD-10-CM | POA: Insufficient documentation

## 2014-11-11 DIAGNOSIS — R251 Tremor, unspecified: Secondary | ICD-10-CM | POA: Insufficient documentation

## 2014-11-11 DIAGNOSIS — F39 Unspecified mood [affective] disorder: Secondary | ICD-10-CM | POA: Insufficient documentation

## 2014-11-11 DIAGNOSIS — J309 Allergic rhinitis, unspecified: Secondary | ICD-10-CM | POA: Insufficient documentation

## 2014-11-11 DIAGNOSIS — M5410 Radiculopathy, site unspecified: Secondary | ICD-10-CM | POA: Insufficient documentation

## 2014-11-11 DIAGNOSIS — M7550 Bursitis of unspecified shoulder: Secondary | ICD-10-CM | POA: Insufficient documentation

## 2014-11-11 DIAGNOSIS — G249 Dystonia, unspecified: Secondary | ICD-10-CM | POA: Insufficient documentation

## 2014-12-28 ENCOUNTER — Ambulatory Visit (INDEPENDENT_AMBULATORY_CARE_PROVIDER_SITE_OTHER): Payer: Medicaid Other | Admitting: Internal Medicine

## 2014-12-28 ENCOUNTER — Encounter: Payer: Self-pay | Admitting: Internal Medicine

## 2014-12-28 ENCOUNTER — Encounter (INDEPENDENT_AMBULATORY_CARE_PROVIDER_SITE_OTHER): Payer: Self-pay

## 2014-12-28 VITALS — BP 136/90 | HR 80 | Ht 73.0 in | Wt 174.4 lb

## 2014-12-28 DIAGNOSIS — R279 Unspecified lack of coordination: Secondary | ICD-10-CM | POA: Diagnosis not present

## 2014-12-28 DIAGNOSIS — G249 Dystonia, unspecified: Secondary | ICD-10-CM

## 2014-12-28 DIAGNOSIS — Z0000 Encounter for general adult medical examination without abnormal findings: Secondary | ICD-10-CM

## 2014-12-28 DIAGNOSIS — R399 Unspecified symptoms and signs involving the genitourinary system: Secondary | ICD-10-CM

## 2014-12-28 DIAGNOSIS — I10 Essential (primary) hypertension: Secondary | ICD-10-CM

## 2014-12-28 DIAGNOSIS — R251 Tremor, unspecified: Secondary | ICD-10-CM

## 2014-12-28 DIAGNOSIS — K219 Gastro-esophageal reflux disease without esophagitis: Secondary | ICD-10-CM

## 2014-12-28 LAB — POCT URINALYSIS DIPSTICK
Bilirubin, UA: NEGATIVE
Glucose, UA: NEGATIVE
Ketones, UA: NEGATIVE
Leukocytes, UA: NEGATIVE
NITRITE UA: NEGATIVE
Protein, UA: NEGATIVE
RBC UA: NEGATIVE
SPEC GRAV UA: 1.02
Urobilinogen, UA: 0.2
pH, UA: 5

## 2014-12-28 NOTE — Progress Notes (Signed)
Date:  12/28/2014   Name:  David Rhodes   DOB:  04/11/1956   MRN:  [id number]   Chief Complaint: Annual Exam David Rhodes is a 59 y.o. male who presents today for his Complete Annual Exam. He feels fairly well. He reports exercising none. He reports he is sleeping well. He was recently treated for bronchitis that has resolved. He reports some left shoulder giving way with pressure on outstretched arm.  No shoulder pain or injury.  He also has some stiffness in left hand at times - history of possible fracture of second MCP joint. He continues to follow up with Neurosurgery.  Recently had the battery replaced on his stimulator.  He continues on his same medications.  He is not aware of any refills needed.   Review of Systems:  Review of Systems  Constitutional: Negative for fever, chills and appetite change.  HENT: Positive for postnasal drip, sinus pressure, sneezing and trouble swallowing. Negative for ear pain, hearing loss and voice change.   Eyes: Negative for pain and visual disturbance.  Respiratory: Positive for cough. Negative for choking, chest tightness, shortness of breath and wheezing.   Cardiovascular: Negative for chest pain, palpitations and leg swelling.  Gastrointestinal: Negative for abdominal pain, constipation and blood in stool.  Genitourinary: Positive for urgency and frequency. Negative for hematuria and testicular pain.  Musculoskeletal: Positive for neck pain. Negative for back pain.  Neurological: Positive for speech difficulty. Negative for seizures, light-headedness and headaches.  Hematological: Negative for adenopathy.  Psychiatric/Behavioral: Positive for decreased concentration. Negative for dysphoric mood. The patient is not nervous/anxious and is not hyperactive.     Patient Active Problem List   Diagnosis Date Noted  . Allergic rhinitis 11/11/2014  . Bursitis of shoulder 11/11/2014  . Nerve root pain 11/11/2014  . Dyskinesia 11/11/2014    . Acid reflux 11/11/2014  . Calcium blood increased 11/11/2014  . Affective disorder 11/11/2014  . Has a tremor 11/11/2014  . Screening for colon cancer 12/25/2011  . Hypertension 12/25/2011  . Chronic back pain 12/25/2011    Prior to Admission medications   Medication Sig Start Date End Date Taking? Authorizing Provider  albuterol (PROVENTIL HFA;VENTOLIN HFA) 108 (90 BASE) MCG/ACT inhaler Inhale 2 puffs into the lungs 4 (four) times daily as needed. 08/22/14  Yes Historical Provider, MD  amLODipine (NORVASC) 10 MG tablet Take 10 mg by mouth daily.   Yes Historical Provider, MD  fexofenadine (ALLEGRA) 180 MG tablet Take 180 mg by mouth daily.   Yes Historical Provider, MD  Fluticasone Furoate-Vilanterol 100-25 MCG/INH AEPB Inhale 1 puff into the lungs daily. 03/29/14  Yes Historical Provider, MD  gabapentin (NEURONTIN) 600 MG tablet Take 600 mg by mouth 3 (three) times daily.   Yes Historical Provider, MD  lisinopril (PRINIVIL,ZESTRIL) 40 MG tablet Take 40 mg by mouth daily.   Yes Historical Provider, MD  montelukast (SINGULAIR) 10 MG tablet Take 10 mg by mouth at bedtime.   Yes Historical Provider, MD  Omeprazole 20 MG TBEC Take 1 tablet by mouth daily.   Yes Historical Provider, MD  rOPINIRole (REQUIP) 2 MG tablet Take 2 mg by mouth 2 (two) times daily.   Yes Historical Provider, MD  tiotropium (SPIRIVA) 18 MCG inhalation capsule Place 1 capsule into inhaler and inhale daily. 10/24/14 10/24/15 Yes Historical Provider, MD  cetirizine (ZYRTEC) 10 MG tablet Take 1 tablet by mouth daily as needed.    Historical Provider, MD  lisinopril (PRINIVIL,ZESTRIL) 20 MG tablet Take 1  tablet by mouth. 03/14/14   Historical Provider, MD  oxyCODONE-acetaminophen (ROXICET) 5-325 MG per tablet Take 1-2 tablets by mouth every 4 (four) hours as needed. Patient not taking: Reported on 12/28/2014 02/07/14   Axel Filler, MD  sertraline (ZOLOFT) 25 MG tablet Take 1 tablet by mouth daily. 11/03/13   Historical  Provider, MD    Allergies  Allergen Reactions  . Penicillins Shortness Of Breath    Past Surgical History  Procedure Laterality Date  . Nasal sinus surgery    . Deep brain stimulator placement    . Subthalamic stimulator battery replacement  12/09/2011    Procedure: SUBTHALAMIC STIMULATOR BATTERY REPLACEMENT;  Surgeon: Maeola Harman, MD;  Location: [facility];  Service: Neurosurgery;  Laterality: N/A;   deep brain stimulator, implantable pulse generator change  . Inguinal hernia repair Bilateral 02/07/2014    Procedure:  BILATERAL INGUINAL HERNIA REPAIR;  Surgeon: Axel Filler, MD;  Location: [facility] OR;  Service: General;  Laterality: Bilateral;  . Insertion of mesh Bilateral 02/07/2014    Procedure: INSERTION OF MESH;  Surgeon: Axel Filler, MD;  Location: [facility] OR;  Service: General;  Laterality: Bilateral;  . Subthalamic stimulator battery replacement Left 04/21/2014    Procedure: Deep brain stimulator battery change;  Surgeon: Maeola Harman, MD;  Location: [facility];  Service: Neurosurgery;  Laterality: Left;  Deep brain stimulator battery change    History  Substance Use Topics  . Smoking status: Never Smoker   . Smokeless tobacco: Current User    Types: Snuff  . Alcohol Use: No     Medication list has been reviewed and updated.  Physical Examination:  Physical Exam  Constitutional: He is oriented to person, place, and time. He appears well-developed and well-nourished. No distress.  HENT:  Head: Normocephalic and atraumatic.  Mouth/Throat: No oropharyngeal exudate.  Eyes: Conjunctivae are normal. Pupils are equal, round, and reactive to light.  Neck: Trachea normal and normal range of motion. Neck supple. Carotid bruit is not present. No thyroid mass and no thyromegaly present.    Cardiovascular: Normal rate, regular rhythm, S1 normal and intact distal pulses.   Brain stimulator battery pack in place in left upper chest - overlying skin intact  Pulmonary/Chest:  Effort normal and breath sounds normal. He has no wheezes. He has no rhonchi.  Abdominal: Soft. Normal appearance and bowel sounds are normal. There is no hepatosplenomegaly. There is no tenderness.  Genitourinary:  Deferred by patient  Musculoskeletal:       Left shoulder: Normal. He exhibits normal range of motion, no tenderness and no bony tenderness.       Left hand: He exhibits bony tenderness.       Hands: Lymphadenopathy:    He has no cervical adenopathy.    He has no axillary adenopathy.  Neurological: He is alert and oriented to person, place, and time. He has normal strength and normal reflexes.  Skin: Skin is warm, dry and intact.  Psychiatric: He has a normal mood and affect. His speech is normal and behavior is normal. Cognition and memory are normal.   BP 136/90 mmHg  Pulse 80  Ht 6\' 1"  (1.854 m)  Wt 174 lb 6.4 oz (79.107 kg)  BMI 23.01 kg/m2  Assessment and Plan: 1. Annual physical exam - POCT urinalysis dipstick - Lipid panel  2. Gastroesophageal reflux disease, esophagitis presence not specified Continue PPI, swallowing issues worsen will need BaSw or GI evaluation - CBC with Differential/Platelet  3. Essential hypertension Fair control -  continue current therapy - TSH - Comprehensive metabolic panel  4. Lower urinary tract symptoms (LUTS) Pt declines DRE today - will return if symptoms progress - PSA  5. Dyskinesia stable  6. Has a tremor Followed by Neurosurgery with a deep brain stimulator   Bari EdwardLaura Ahaana Rochette, MD [facility]  12/28/2014

## 2014-12-29 LAB — LIPID PANEL
Chol/HDL Ratio: 2.4 ratio units (ref 0.0–5.0)
Cholesterol, Total: 151 mg/dL (ref 100–199)
HDL: 62 mg/dL (ref >39)
LDL Calculated: 76 mg/dL (ref 0–99)
Triglycerides: 64 mg/dL (ref 0–149)
VLDL Cholesterol Cal: 13 mg/dL (ref 5–40)

## 2014-12-29 LAB — CBC WITH DIFFERENTIAL/PLATELET
BASOS ABS: 0.1 10*3/uL (ref 0.0–0.2)
BASOS: 1 %
EOS (ABSOLUTE): 0.7 10*3/uL — AB (ref 0.0–0.4)
EOS: 9 %
HEMOGLOBIN: 15.6 g/dL (ref 12.6–17.7)
Hematocrit: 45.2 % (ref 37.5–51.0)
IMMATURE GRANULOCYTES: 0 %
Immature Grans (Abs): 0 10*3/uL (ref 0.0–0.1)
LYMPHS ABS: 2.2 10*3/uL (ref 0.7–3.1)
LYMPHS: 29 %
MCH: 30.3 pg (ref 26.6–33.0)
MCHC: 34.5 g/dL (ref 31.5–35.7)
MCV: 88 fL (ref 79–97)
MONOS ABS: 0.6 10*3/uL (ref 0.1–0.9)
Monocytes: 7 %
Neutrophils Absolute: 4.2 10*3/uL (ref 1.4–7.0)
Neutrophils: 54 %
Platelets: 225 10*3/uL (ref 150–379)
RBC: 5.15 x10E6/uL (ref 4.14–5.80)
RDW: 14.1 % (ref 12.3–15.4)
WBC: 7.7 10*3/uL (ref 3.4–10.8)

## 2014-12-29 LAB — COMPREHENSIVE METABOLIC PANEL
A/G RATIO: 1.8 (ref 1.1–2.5)
ALT: 16 IU/L (ref 0–44)
AST: 15 IU/L (ref 0–40)
Albumin: 4.4 g/dL (ref 3.5–5.5)
Alkaline Phosphatase: 101 IU/L (ref 39–117)
BUN / CREAT RATIO: 13 (ref 9–20)
BUN: 12 mg/dL (ref 6–24)
Bilirubin Total: 0.2 mg/dL (ref 0.0–1.2)
CALCIUM: 10.2 mg/dL (ref 8.7–10.2)
CHLORIDE: 102 mmol/L (ref 97–108)
CO2: 26 mmol/L (ref 18–29)
CREATININE: 0.91 mg/dL (ref 0.76–1.27)
GFR calc Af Amer: 106 mL/min/{1.73_m2} (ref >59)
GFR calc non Af Amer: 92 mL/min/{1.73_m2} (ref >59)
Globulin, Total: 2.4 g/dL (ref 1.5–4.5)
Glucose: 84 mg/dL (ref 65–99)
Potassium: 5.1 mmol/L (ref 3.5–5.2)
SODIUM: 142 mmol/L (ref 134–144)
TOTAL PROTEIN: 6.8 g/dL (ref 6.0–8.5)

## 2014-12-29 LAB — TSH: TSH: 1.61 u[IU]/mL (ref 0.450–4.500)

## 2014-12-29 LAB — PSA: PROSTATE SPECIFIC AG, SERUM: 0.4 ng/mL (ref 0.0–4.0)

## 2015-02-06 ENCOUNTER — Other Ambulatory Visit: Payer: Self-pay | Admitting: Internal Medicine

## 2015-02-07 ENCOUNTER — Other Ambulatory Visit: Payer: Self-pay | Admitting: Internal Medicine

## 2015-02-07 MED ORDER — ROPINIROLE HCL 2 MG PO TABS: 2.0000 mg | ORAL_TABLET | Freq: Two times a day (BID) | ORAL | Status: DC

## 2015-04-10 ENCOUNTER — Encounter: Payer: Self-pay | Admitting: Internal Medicine

## 2015-04-10 ENCOUNTER — Ambulatory Visit (INDEPENDENT_AMBULATORY_CARE_PROVIDER_SITE_OTHER): Payer: Medicare Other | Admitting: Internal Medicine

## 2015-04-10 VITALS — BP 168/100 | HR 98 | Ht 72.0 in | Wt 175.8 lb

## 2015-04-10 DIAGNOSIS — J40 Bronchitis, not specified as acute or chronic: Secondary | ICD-10-CM | POA: Diagnosis not present

## 2015-04-10 MED ORDER — ALBUTEROL SULFATE HFA 108 (90 BASE) MCG/ACT IN AERS: 2.0000 | INHALATION_SPRAY | Freq: Four times a day (QID) | RESPIRATORY_TRACT | Status: DC | PRN

## 2015-04-10 MED ORDER — LEVOFLOXACIN 500 MG PO TABS: 500.0000 mg | ORAL_TABLET | Freq: Every day | ORAL | Status: DC

## 2015-04-10 MED ORDER — GUAIFENESIN-CODEINE 100-10 MG/5ML PO SYRP: 5.0000 mL | ORAL_SOLUTION | Freq: Three times a day (TID) | ORAL | Status: DC | PRN

## 2015-04-10 NOTE — Progress Notes (Signed)
Date:  04/10/2015   Name:  David Rhodes   DOB:  1955/12/04   MRN:  [id number]   Chief Complaint: Bronchitis and Cough Cough This is a new problem. The current episode started 1 to 4 weeks ago. The problem has been gradually worsening. The cough is productive of sputum. Associated symptoms include chest pain, shortness of breath and wheezing. Pertinent negatives include no chills, fever, headaches, nasal congestion, postnasal drip or sweats. The symptoms are aggravated by lying down and exercise. He has tried steroid inhaler for the symptoms. The treatment provided mild relief.   Is using Dulera daily without much benefit. He also has a Proventil inhaler which she has tried intermittently. He has no history of asthma and no history of COPD or smoking. He is on medications for chronic allergies.  Review of Systems:  Review of Systems  Constitutional: Positive for fatigue. Negative for fever and chills.  HENT: Negative for mouth sores, postnasal drip and sinus pressure.   Respiratory: Positive for cough, chest tightness, shortness of breath and wheezing.   Cardiovascular: Positive for chest pain. Negative for palpitations and leg swelling.  Gastrointestinal: Positive for abdominal pain (muscle strain from coughing). Negative for vomiting and diarrhea.  Neurological: Negative for seizures, syncope and headaches.    Patient Active Problem List   Diagnosis Date Noted  . Allergic rhinitis 11/11/2014  . Bursitis of shoulder 11/11/2014  . Nerve root pain 11/11/2014  . Dyskinesia 11/11/2014  . Acid reflux 11/11/2014  . Calcium blood increased 11/11/2014  . Affective disorder (HCC) 11/11/2014  . Has a tremor 11/11/2014  . Screening for colon cancer 12/25/2011  . Hypertension 12/25/2011  . Chronic back pain 12/25/2011    Prior to Admission medications   Medication Sig Start Date End Date Taking? Authorizing Provider  albuterol (PROVENTIL HFA;VENTOLIN HFA) 108 (90 BASE) MCG/ACT inhaler  Inhale 2 puffs into the lungs 4 (four) times daily as needed. 08/22/14  Yes Historical Provider, MD  amLODipine (NORVASC) 10 MG tablet Take 10 mg by mouth daily.   Yes Historical Provider, MD  fexofenadine (ALLEGRA) 180 MG tablet Take 180 mg by mouth daily.   Yes Historical Provider, MD  Fluticasone Furoate-Vilanterol 100-25 MCG/INH AEPB Inhale 1 puff into the lungs daily. 03/29/14  Yes Historical Provider, MD  gabapentin (NEURONTIN) 600 MG tablet TAKE 1 TABLET BY MOUTH FOUR TIMES DAILY 02/06/15  Yes Reubin Milan, MD  levocetirizine (XYZAL) 5 MG tablet TK 1 T PO QD 02/03/15  Yes Historical Provider, MD  lisinopril (PRINIVIL,ZESTRIL) 40 MG tablet Take 40 mg by mouth daily.   Yes Historical Provider, MD  montelukast (SINGULAIR) 10 MG tablet Take 10 mg by mouth at bedtime.   Yes Historical Provider, MD  Omeprazole 20 MG TBEC Take 1 tablet by mouth daily.   Yes Historical Provider, MD  rOPINIRole (REQUIP) 2 MG tablet Take 1 tablet (2 mg total) by mouth 2 (two) times daily. 02/07/15  Yes Reubin Milan, MD  tiotropium (SPIRIVA) 18 MCG inhalation capsule Place 1 capsule into inhaler and inhale daily. 10/24/14 10/24/15 Yes Historical Provider, MD    Allergies  Allergen Reactions  . Penicillins Shortness Of Breath    Past Surgical History  Procedure Laterality Date  . Nasal sinus surgery    . Deep brain stimulator placement    . Subthalamic stimulator battery replacement  12/09/2011    Procedure: SUBTHALAMIC STIMULATOR BATTERY REPLACEMENT;  Surgeon: Maeola Harman, MD;  Location: [facility];  Service: Neurosurgery;  Laterality: N/A;  deep brain stimulator, implantable pulse generator change  . Inguinal hernia repair Bilateral 02/07/2014    Procedure:  BILATERAL INGUINAL HERNIA REPAIR;  Surgeon: Axel Filler, MD;  Location: [facility] OR;  Service: General;  Laterality: Bilateral;  . Insertion of mesh Bilateral 02/07/2014    Procedure: INSERTION OF MESH;  Surgeon: Axel Filler, MD;  Location: [facility];   Service: General;  Laterality: Bilateral;  . Subthalamic stimulator battery replacement Left 04/21/2014    Procedure: Deep brain stimulator battery change;  Surgeon: Maeola Harman, MD;  Location: [facility];  Service: Neurosurgery;  Laterality: Left;  Deep brain stimulator battery change    Social History  Substance Use Topics  . Smoking status: Never Smoker   . Smokeless tobacco: Current User    Types: Snuff  . Alcohol Use: No     Medication list has been reviewed and updated.   Physical Exam  Constitutional: He appears well-developed. He has a sickly appearance.  Neck: Normal range of motion. Neck supple. No tracheal tenderness present. No thyromegaly present.  Cardiovascular: Normal rate, regular rhythm and normal heart sounds.   Pulmonary/Chest: Accessory muscle usage present. He has rhonchi in the right upper field, the right middle field, the right lower field, the left upper field, the left middle field and the left lower field.  Psychiatric: He has a normal mood and affect.  Nursing note and vitals reviewed.   BP 168/100 mmHg  Pulse 98  Ht 6' (1.829 m)  Wt 175 lb 12.8 oz (79.742 kg)  BMI 23.84 kg/m2  SpO2 98%  Assessment and Plan: 1. Bronchitis Continue Dulera; use albuterol every 4 hours as needed for chest tightness Follow-up if symptoms do not improve - guaiFENesin-codeine (ROBITUSSIN AC) 100-10 MG/5ML syrup; Take 5 mLs by mouth 3 (three) times daily as needed for cough.  Dispense: 150 mL; Refill: 0 - levofloxacin (LEVAQUIN) 500 MG tablet; Take 1 tablet (500 mg total) by mouth daily.  Dispense: 10 tablet; Refill: 0 - albuterol (PROVENTIL HFA;VENTOLIN HFA) 108 (90 BASE) MCG/ACT inhaler; Inhale 2 puffs into the lungs 4 (four) times daily as needed.  Dispense: 18 g; Refill: 5   Bari Edward, MD [facility]  04/10/2015

## 2015-04-30 ENCOUNTER — Ambulatory Visit (INDEPENDENT_AMBULATORY_CARE_PROVIDER_SITE_OTHER): Payer: Medicare Other | Admitting: Neurology

## 2015-04-30 ENCOUNTER — Encounter: Payer: Self-pay | Admitting: Neurology

## 2015-04-30 VITALS — BP 134/86 | HR 90 | Ht 72.0 in | Wt 166.0 lb

## 2015-04-30 DIAGNOSIS — R292 Abnormal reflex: Secondary | ICD-10-CM

## 2015-04-30 DIAGNOSIS — G250 Essential tremor: Secondary | ICD-10-CM | POA: Diagnosis not present

## 2015-04-30 DIAGNOSIS — G2581 Restless legs syndrome: Secondary | ICD-10-CM | POA: Diagnosis not present

## 2015-04-30 NOTE — Procedures (Signed)
DBS Programming was performed.    Total time spent programming was 45 minutes.  Device was confirmed to be on.  Soft start was confirmed to be on.  Impedences were checked and were within normal limits.  Battery was checked and was determined to be functioning normally and not near the end of life.  Final settings were as follows:  Group A:  Left brain electrode:     2-1+           ; Amplitude  3.7   V   ; Pulse width 90 microseconds;   Frequency   135   Hz.  Right brain electrode:     7-C+          ; Amplitude   3.4  V ;  Pulse width 110  microseconds;  Frequency   135    Hz.  Group B  Left brain electrode:     2-C+           ; Amplitude  4.4   V   ; Pulse width 60 microseconds;   Frequency   130   Hz.  Right brain electrode:     7-C+          ; Amplitude   3.4  V ;  Pulse width 90  microseconds;  Frequency   130    Hz.
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
## 2015-05-17 ENCOUNTER — Encounter: Payer: Self-pay | Admitting: Emergency Medicine

## 2015-05-17 ENCOUNTER — Ambulatory Visit
Admission: EM | Admit: 2015-05-17 | Discharge: 2015-05-17 | Disposition: A | Discharge status: Home / Self Care | Payer: Medicare Other | Attending: Family Medicine | Admitting: Family Medicine

## 2015-05-17 ENCOUNTER — Ambulatory Visit (INDEPENDENT_AMBULATORY_CARE_PROVIDER_SITE_OTHER): Payer: Medicare Other

## 2015-05-17 DIAGNOSIS — F419 Anxiety disorder, unspecified: Secondary | ICD-10-CM | POA: Diagnosis not present

## 2015-05-17 DIAGNOSIS — Z880 Allergy status to penicillin: Secondary | ICD-10-CM | POA: Diagnosis not present

## 2015-05-17 DIAGNOSIS — R05 Cough: Secondary | ICD-10-CM

## 2015-05-17 DIAGNOSIS — R059 Cough, unspecified: Secondary | ICD-10-CM

## 2015-05-17 DIAGNOSIS — R0602 Shortness of breath: Secondary | ICD-10-CM

## 2015-05-17 DIAGNOSIS — Z79899 Other long term (current) drug therapy: Secondary | ICD-10-CM | POA: Diagnosis not present

## 2015-05-17 DIAGNOSIS — F329 Major depressive disorder, single episode, unspecified: Secondary | ICD-10-CM | POA: Diagnosis not present

## 2015-05-17 DIAGNOSIS — I10 Essential (primary) hypertension: Secondary | ICD-10-CM | POA: Insufficient documentation

## 2015-05-17 LAB — BASIC METABOLIC PANEL
ANION GAP: 5 (ref 5–15)
BUN: 10 mg/dL (ref 6–20)
CALCIUM: 10.5 mg/dL — AB (ref 8.9–10.3)
CO2: 28 mmol/L (ref 22–32)
Chloride: 105 mmol/L (ref 101–111)
Creatinine, Ser: 0.94 mg/dL (ref 0.61–1.24)
Glucose, Bld: 88 mg/dL (ref 65–99)
POTASSIUM: 4.8 mmol/L (ref 3.5–5.1)
Sodium: 138 mmol/L (ref 135–145)

## 2015-05-17 LAB — CBC WITH DIFFERENTIAL/PLATELET
BASOS ABS: 0.2 10*3/uL — AB (ref 0–0.1)
BASOS PCT: 1 %
Eosinophils Absolute: 1.1 10*3/uL — ABNORMAL HIGH (ref 0–0.7)
Eosinophils Relative: 9 %
HEMATOCRIT: 48.2 % (ref 40.0–52.0)
Hemoglobin: 16.6 g/dL (ref 13.0–18.0)
LYMPHS PCT: 23 %
Lymphs Abs: 3 10*3/uL (ref 1.0–3.6)
MCH: 29.8 pg (ref 26.0–34.0)
MCHC: 34.4 g/dL (ref 32.0–36.0)
MCV: 86.6 fL (ref 80.0–100.0)
MONO ABS: 1.1 10*3/uL — AB (ref 0.2–1.0)
Monocytes Relative: 9 %
NEUTROS ABS: 7.7 10*3/uL — AB (ref 1.4–6.5)
NEUTROS PCT: 58 %
Platelets: 223 10*3/uL (ref 150–440)
RBC: 5.56 MIL/uL (ref 4.40–5.90)
RDW: 13.1 % (ref 11.5–14.5)
WBC: 13.1 10*3/uL — AB (ref 3.8–10.6)

## 2015-05-17 MED ORDER — METHYLPREDNISOLONE SODIUM SUCC 125 MG IJ SOLR
125.0000 mg | Freq: Once | INTRAMUSCULAR | Status: AC
  Administered 2015-05-17: 125 mg via INTRAMUSCULAR

## 2015-05-17 MED ORDER — PREDNISONE 10 MG PO TABS: ORAL_TABLET | ORAL | Status: DC

## 2015-05-17 MED ORDER — IPRATROPIUM-ALBUTEROL 0.5-2.5 (3) MG/3ML IN SOLN
3.0000 mL | Freq: Once | RESPIRATORY_TRACT | Status: AC
  Administered 2015-05-17: 3 mL via RESPIRATORY_TRACT

## 2015-05-17 MED ORDER — AZITHROMYCIN 250 MG PO TABS: ORAL_TABLET | ORAL | Status: DC

## 2015-05-17 MED ORDER — FLUTICASONE PROPIONATE HFA 110 MCG/ACT IN AERO
2.0000 | INHALATION_SPRAY | Freq: Two times a day (BID) | RESPIRATORY_TRACT | Status: DC
Start: 2015-05-17 — End: 2016-09-08

## 2015-05-17 MED ORDER — ALBUTEROL SULFATE HFA 108 (90 BASE) MCG/ACT IN AERS: 2.0000 | INHALATION_SPRAY | RESPIRATORY_TRACT | Status: DC | PRN

## 2015-05-17 NOTE — ED Notes (Signed)
Patient c/o cough, chest congestion and SOB for the past months.  Patient denies fevers.

## 2015-05-17 NOTE — Discharge Instructions (Signed)
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
## 2015-06-19 ENCOUNTER — Ambulatory Visit: Payer: Medicare Other | Admitting: Neurology

## 2015-07-02 ENCOUNTER — Ambulatory Visit (INDEPENDENT_AMBULATORY_CARE_PROVIDER_SITE_OTHER): Payer: Medicare Other

## 2015-07-02 ENCOUNTER — Ambulatory Visit
Admission: EM | Admit: 2015-07-02 | Discharge: 2015-07-02 | Disposition: A | Discharge status: Home / Self Care | Payer: Medicare Other | Attending: Family Medicine | Admitting: Family Medicine

## 2015-07-02 ENCOUNTER — Encounter: Payer: Self-pay | Admitting: Emergency Medicine

## 2015-07-02 DIAGNOSIS — R05 Cough: Secondary | ICD-10-CM | POA: Diagnosis not present

## 2015-07-02 DIAGNOSIS — R0602 Shortness of breath: Secondary | ICD-10-CM | POA: Diagnosis not present

## 2015-07-02 DIAGNOSIS — R062 Wheezing: Secondary | ICD-10-CM

## 2015-07-02 DIAGNOSIS — R059 Cough, unspecified: Secondary | ICD-10-CM

## 2015-07-02 MED ORDER — PREDNISONE 20 MG PO TABS: ORAL_TABLET | ORAL | Status: DC

## 2015-07-02 MED ORDER — IPRATROPIUM-ALBUTEROL 0.5-2.5 (3) MG/3ML IN SOLN
3.0000 mL | Freq: Once | RESPIRATORY_TRACT | Status: AC
  Administered 2015-07-02: 3 mL via RESPIRATORY_TRACT

## 2015-07-02 MED ORDER — AZITHROMYCIN 250 MG PO TABS: ORAL_TABLET | ORAL | Status: DC

## 2015-07-02 NOTE — ED Notes (Signed)
Patient states SOB started back in December.

## 2015-07-02 NOTE — ED Provider Notes (Signed)
CSN: [id number]     Arrival date & time 07/02/15  1451 History   First MD Initiated Contact with Patient 07/02/15 1457     Chief Complaint  Patient presents with  . Shortness of Breath   (Consider location/radiation/quality/duration/timing/severity/associated sxs/prior Treatment) Patient is a 60 y.o. male presenting with shortness of breath and URI. The history is provided by the patient.  Shortness of Breath Associated symptoms: cough and wheezing   URI Presenting symptoms: congestion, cough and fatigue   Presenting symptoms comment:  Shortness of breath Severity:  Moderate Onset quality:  Sudden Duration:  1 week Timing:  Constant Progression:  Worsening Chronicity:  Recurrent Relieved by:  Nothing Ineffective treatments:  OTC medications Associated symptoms: myalgias and wheezing     Past Medical History  Diagnosis Date  . Hypertension   . Anxiety   . Depression   . Shortness of breath     when allergies flare up  . Pneumonia     many years ago  . GERD (gastroesophageal reflux disease)   . Headache(784.0)   . Neuromuscular disorder (HCC)     hx of tremors  . Chronic pain due to trauma   . Allergy     Hay fever  . Urine incontinence   . Wears glasses   . Inguinal hernia     bilateral   Past Surgical History  Procedure Laterality Date  . Nasal sinus surgery    . Deep brain stimulator placement    . Subthalamic stimulator battery replacement  12/09/2011    Procedure: SUBTHALAMIC STIMULATOR BATTERY REPLACEMENT;  Surgeon: Maeola Harman, MD;  Location: [facility];  Service: Neurosurgery;  Laterality: N/A;   deep brain stimulator, implantable pulse generator change  . Inguinal hernia repair Bilateral 02/07/2014    Procedure:  BILATERAL INGUINAL HERNIA REPAIR;  Surgeon: Axel Filler, MD;  Location: [facility];  Service: General;  Laterality: Bilateral;  . Insertion of mesh Bilateral 02/07/2014    Procedure: INSERTION OF MESH;  Surgeon: Axel Filler, MD;  Location: [facility]  OR;  Service: General;  Laterality: Bilateral;  . Subthalamic stimulator battery replacement Left 04/21/2014    Procedure: Deep brain stimulator battery change;  Surgeon: Maeola Harman, MD;  Location: [facility];  Service: Neurosurgery;  Laterality: Left;  Deep brain stimulator battery change   Family History  Problem Relation Age of Onset  . Myoclonus Mother   . Breast cancer Mother   . Prostate cancer Father   . Myoclonus Maternal Uncle   . Heart disease Paternal Grandfather    Social History  Substance Use Topics  . Smoking status: Never Smoker   . Smokeless tobacco: Current User    Types: Snuff  . Alcohol Use: No    Review of Systems  Constitutional: Positive for fatigue.  HENT: Positive for congestion.   Respiratory: Positive for cough, shortness of breath and wheezing.   Musculoskeletal: Positive for myalgias.    Allergies  Penicillins  Home Medications   Prior to Admission medications   Medication Sig Start Date End Date Taking? Authorizing Provider  albuterol (PROVENTIL HFA;VENTOLIN HFA) 108 (90 BASE) MCG/ACT inhaler Inhale 2 puffs into the lungs 4 (four) times daily as needed. 04/10/15   Reubin Milan, MD  albuterol (PROVENTIL HFA;VENTOLIN HFA) 108 (90 BASE) MCG/ACT inhaler Inhale 2 puffs into the lungs every 4 (four) hours as needed for wheezing or shortness of breath. 05/17/15   Adrian Blackwater Baker, PA-C  amLODipine (NORVASC) 10 MG tablet Take 10 mg by mouth  daily.    Historical Provider, MD  azithromycin (ZITHROMAX) 250 MG tablet Take 2 tabs po once day 1, then 1 tab po qd for next 4 days.  not dose pak;  please dispense loose pills in a bottle 07/02/15   Payton Mccallum, MD  fexofenadine (ALLEGRA) 180 MG tablet Take 180 mg by mouth daily.    Historical Provider, MD  fluticasone (FLOVENT HFA) 110 MCG/ACT inhaler Inhale 2 puffs into the lungs 2 times daily at 12 noon and 4 pm. 05/17/15   Graylon Good, PA-C  Fluticasone Furoate-Vilanterol 100-25 MCG/INH AEPB Inhale 1  puff into the lungs daily. 03/29/14   Historical Provider, MD  gabapentin (NEURONTIN) 600 MG tablet TAKE 1 TABLET BY MOUTH FOUR TIMES DAILY 02/06/15   Reubin Milan, MD  levocetirizine (XYZAL) 5 MG tablet TK 1 T PO QD 02/03/15   Historical Provider, MD  lisinopril (PRINIVIL,ZESTRIL) 40 MG tablet Take 40 mg by mouth daily.    Historical Provider, MD  montelukast (SINGULAIR) 10 MG tablet Take 10 mg by mouth at bedtime.    Historical Provider, MD  Omeprazole 20 MG TBEC Take 1 tablet by mouth daily.    Historical Provider, MD  predniSONE (DELTASONE) 20 MG tablet 3 tabs po qd for 2 days, then 2 tabs po qd for 3 days, then 1 tab po qd for 3 days, then half a tab po qd for 2 days 07/02/15   Payton Mccallum, MD  rOPINIRole (REQUIP) 2 MG tablet Take 1 tablet (2 mg total) by mouth 2 (two) times daily. 02/07/15   Reubin Milan, MD  tiotropium (SPIRIVA) 18 MCG inhalation capsule Place 1 capsule into inhaler and inhale daily. 10/24/14 10/24/15  Historical Provider, MD   Meds Ordered and Administered this Visit   Medications  ipratropium-albuterol (DUONEB) 0.5-2.5 (3) MG/3ML nebulizer solution 3 mL (3 mLs Nebulization Given 07/02/15 1500)  ipratropium-albuterol (DUONEB) 0.5-2.5 (3) MG/3ML nebulizer solution 3 mL (3 mLs Nebulization Given 07/02/15 1630)    BP 165/121 mmHg  Pulse 105  Temp(Src) 97.6 F (36.4 C) (Tympanic)  Resp 20  Ht 6' (1.829 m)  Wt 169 lb (76.658 kg)  BMI 22.92 kg/m2  SpO2 95% No data found.   Physical Exam  Constitutional: He appears well-developed and well-nourished. No distress.  HENT:  Head: Normocephalic and atraumatic.  Right Ear: Tympanic membrane, external ear and ear canal normal.  Left Ear: Tympanic membrane, external ear and ear canal normal.  Nose: Rhinorrhea present.  Mouth/Throat: Uvula is midline, oropharynx is clear and moist and mucous membranes are normal. No oropharyngeal exudate or tonsillar abscesses.  Eyes: Conjunctivae and EOM are normal. Pupils are equal, round,  and reactive to light. Right eye exhibits no discharge. Left eye exhibits no discharge. No scleral icterus.  Neck: Normal range of motion. Neck supple. No tracheal deviation present. No thyromegaly present.  Cardiovascular: Normal rate, regular rhythm and normal heart sounds.   Pulmonary/Chest: Effort normal. No stridor. No respiratory distress. He has wheezes. He has no rales. He exhibits no tenderness.  Diffuse wheezes and rhonchi bilaterally  Lymphadenopathy:    He has no cervical adenopathy.  Neurological: He is alert.  Skin: Skin is warm and dry. No rash noted. He is not diaphoretic.  Nursing note and vitals reviewed.   ED Course  Procedures (including critical care time)  Labs Review Labs Reviewed - No data to display  Imaging Review Dg Chest 2 View  07/02/2015  CLINICAL DATA:  Shortness of breath.  Cough.  EXAM: CHEST  2 VIEW COMPARISON:  05/17/2015 FINDINGS: Hyperinflation. Convex right thoracic spine curvature. Deep brain stimulator projecting over the upper left chest. Midline trachea. Normal heart size and mediastinal contours. No pleural effusion or pneumothorax. Clear lungs. IMPRESSION: Hyperinflation, without acute disease. Electronically Signed   By: Jeronimo GreavesKyle  Talbot M.D.   On: 07/02/2015 15:47     Visual Acuity Review  Right Eye Distance:   Left Eye Distance:   Bilateral Distance:    Right Eye Near:   Left Eye Near:    Bilateral Near:         MDM   1. Shortness of breath   2. Cough   3. Wheezing     Discharge Medication List as of 07/02/2015  4:59 PM     1. x-ray results (negative chest x-ray) and diagnosis reviewed with patient 2. Patient given Duoneb treatments x 2 in clinic with improvement of symptoms 3. rx as per orders above; reviewed possible side effects, interactions, risks and benefits; also recommend continue current home albuterol inhaler 4. Recommend supportive treatment with increased fluids, otc cold/cough medication 5. Follow-up prn if  symptoms worsen or don't improve   Payton Mccallumrlando Jameson Tormey, MD 07/02/15 1706

## 2015-07-03 ENCOUNTER — Ambulatory Visit: Payer: Medicare Other | Admitting: Neurology

## 2015-09-04 ENCOUNTER — Other Ambulatory Visit: Payer: Self-pay | Admitting: Internal Medicine

## 2015-09-04 MED ORDER — ROPINIROLE HCL 2 MG PO TABS: 2.0000 mg | ORAL_TABLET | Freq: Two times a day (BID) | ORAL | Status: DC

## 2015-09-24 ENCOUNTER — Ambulatory Visit (INDEPENDENT_AMBULATORY_CARE_PROVIDER_SITE_OTHER): Payer: Medicare Other | Admitting: Internal Medicine

## 2015-09-24 ENCOUNTER — Encounter: Payer: Self-pay | Admitting: Internal Medicine

## 2015-09-24 VITALS — BP 142/82 | HR 105 | Temp 98.5°F | Ht 72.0 in | Wt 166.0 lb

## 2015-09-24 DIAGNOSIS — I10 Essential (primary) hypertension: Secondary | ICD-10-CM

## 2015-09-24 DIAGNOSIS — J45909 Unspecified asthma, uncomplicated: Secondary | ICD-10-CM | POA: Insufficient documentation

## 2015-09-24 DIAGNOSIS — J45901 Unspecified asthma with (acute) exacerbation: Secondary | ICD-10-CM

## 2015-09-24 MED ORDER — ALBUTEROL SULFATE HFA 108 (90 BASE) MCG/ACT IN AERS: 2.0000 | INHALATION_SPRAY | Freq: Four times a day (QID) | RESPIRATORY_TRACT | Status: DC | PRN

## 2015-09-24 MED ORDER — PREDNISONE 10 MG (21) PO TBPK: 10.0000 mg | ORAL_TABLET | Freq: Every day | ORAL | Status: DC

## 2015-09-24 MED ORDER — AZITHROMYCIN 250 MG PO TABS: ORAL_TABLET | ORAL | Status: DC

## 2015-09-24 NOTE — Progress Notes (Signed)
Date:  09/24/2015   Name:  David Rhodes   DOB:  August 21, 1955   MRN:  [id number]   Chief Complaint: Shortness of Breath Shortness of Breath This is a chronic problem. The current episode started more than 1 year ago. The problem occurs every several days. The problem has been waxing and waning. Associated symptoms include wheezing. Pertinent negatives include no chest pain, fever or sore throat. The symptoms are aggravated by fumes and smoke. He has tried beta agonist inhalers and steroid inhalers for the symptoms. The treatment provided mild relief.  Hypertension This is a chronic problem. The current episode started more than 1 year ago. The problem is unchanged. The problem is controlled. Associated symptoms include shortness of breath. Pertinent negatives include no chest pain or palpitations. Past treatments include ACE inhibitors.  He has several inhalers but he is not sure which ones he takes.  He has seen Dr. Meredeth Ide who feels he has reactive airways disease.  He wanted to be seen there but there was no appointment.  He has occasional sputum production but mostly dry cough and wheezing.  Review of Systems  Constitutional: Negative for fever, chills and fatigue.  HENT: Positive for postnasal drip. Negative for sore throat and trouble swallowing.   Eyes: Negative for visual disturbance.  Respiratory: Positive for chest tightness, shortness of breath and wheezing.   Cardiovascular: Negative for chest pain and palpitations.  Neurological: Positive for tremors. Negative for dizziness and syncope.    Patient Active Problem List   Diagnosis Date Noted  . Reactive airway disease with acute exacerbation 09/24/2015  . Allergic rhinitis 11/11/2014  . Bursitis of shoulder 11/11/2014  . Nerve root pain 11/11/2014  . Dyskinesia 11/11/2014  . Acid reflux 11/11/2014  . Calcium blood increased 11/11/2014  . Affective disorder (HCC) 11/11/2014  . Has a tremor 11/11/2014  . Screening for  colon cancer 12/25/2011  . Hypertension 12/25/2011  . Chronic back pain 12/25/2011    Prior to Admission medications   Medication Sig Start Date End Date Taking? Authorizing Provider  albuterol (PROVENTIL HFA;VENTOLIN HFA) 108 (90 BASE) MCG/ACT inhaler Inhale 2 puffs into the lungs 4 (four) times daily as needed. 04/10/15   Reubin Milan, MD  albuterol (PROVENTIL HFA;VENTOLIN HFA) 108 (90 BASE) MCG/ACT inhaler Inhale 2 puffs into the lungs every 4 (four) hours as needed for wheezing or shortness of breath. 05/17/15   Adrian Blackwater Baker, PA-C  amLODipine (NORVASC) 10 MG tablet Take 10 mg by mouth daily.    Historical Provider, MD  azithromycin (ZITHROMAX) 250 MG tablet Take 2 tabs po once day 1, then 1 tab po qd for next 4 days.  not dose pak;  please dispense loose pills in a bottle 07/02/15   Payton Mccallum, MD  fexofenadine (ALLEGRA) 180 MG tablet Take 180 mg by mouth daily.    Historical Provider, MD  fluticasone (FLOVENT HFA) 110 MCG/ACT inhaler Inhale 2 puffs into the lungs 2 times daily at 12 noon and 4 pm. 05/17/15   Graylon Good, PA-C  Fluticasone Furoate-Vilanterol 100-25 MCG/INH AEPB Inhale 1 puff into the lungs daily. 03/29/14   Historical Provider, MD  gabapentin (NEURONTIN) 600 MG tablet TAKE 1 TABLET BY MOUTH FOUR TIMES DAILY 02/06/15   Reubin Milan, MD  levocetirizine (XYZAL) 5 MG tablet TK 1 T PO QD 02/03/15   Historical Provider, MD  lisinopril (PRINIVIL,ZESTRIL) 40 MG tablet Take 40 mg by mouth daily.    Historical Provider, MD  montelukast (SINGULAIR) 10 MG tablet Take 10 mg by mouth at bedtime.    Historical Provider, MD  Omeprazole 20 MG TBEC Take 1 tablet by mouth daily.    Historical Provider, MD  predniSONE (DELTASONE) 20 MG tablet 3 tabs po qd for 2 days, then 2 tabs po qd for 3 days, then 1 tab po qd for 3 days, then half a tab po qd for 2 days 07/02/15   Payton Mccallumrlando Conty, MD  rOPINIRole (REQUIP) 2 MG tablet Take 1 tablet (2 mg total) by mouth 2 (two) times daily. 09/04/15    Reubin MilanLaura H Gailene Youkhana, MD  tiotropium (SPIRIVA) 18 MCG inhalation capsule Place 1 capsule into inhaler and inhale daily. 10/24/14 10/24/15  Historical Provider, MD    Allergies  Allergen Reactions  . Penicillins Shortness Of Breath    Past Surgical History  Procedure Laterality Date  . Nasal sinus surgery    . Deep brain stimulator placement    . Subthalamic stimulator battery replacement  12/09/2011    Procedure: SUBTHALAMIC STIMULATOR BATTERY REPLACEMENT;  Surgeon: Maeola HarmanJoseph Stern, MD;  Location: [facility];  Service: Neurosurgery;  Laterality: N/A;   deep brain stimulator, implantable pulse generator change  . Inguinal hernia repair Bilateral 02/07/2014    Procedure:  BILATERAL INGUINAL HERNIA REPAIR;  Surgeon: Axel FillerArmando Ramirez, MD;  Location: [facility];  Service: General;  Laterality: Bilateral;  . Insertion of mesh Bilateral 02/07/2014    Procedure: INSERTION OF MESH;  Surgeon: Axel FillerArmando Ramirez, MD;  Location: [facility];  Service: General;  Laterality: Bilateral;  . Subthalamic stimulator battery replacement Left 04/21/2014    Procedure: Deep brain stimulator battery change;  Surgeon: Maeola HarmanJoseph Stern, MD;  Location: [facility];  Service: Neurosurgery;  Laterality: Left;  Deep brain stimulator battery change    Social History  Substance Use Topics  . Smoking status: Never Smoker   . Smokeless tobacco: Current User    Types: Snuff  . Alcohol Use: No    Medication list has been reviewed and updated.   Physical Exam  Constitutional: He appears well-developed and well-nourished.  Neck: Normal range of motion.  Cardiovascular: Normal rate, regular rhythm and normal heart sounds.   Pulmonary/Chest: Effort normal. No accessory muscle usage. No respiratory distress. He has no decreased breath sounds. He has wheezes. He has no rhonchi.  Lymphadenopathy:    He has no cervical adenopathy.  Psychiatric: He has a normal mood and affect. His speech is normal.  Nursing note and vitals reviewed.   BP  142/82 mmHg  Pulse 105  Temp(Src) 98.5 F (36.9 C) (Oral)  Ht 6' (1.829 m)  Wt 166 lb (75.297 kg)  BMI 22.51 kg/m2  SpO2 95%  Assessment and Plan: 1. Reactive airway disease with acute exacerbation Continue rescue MDI and see Dr. Meredeth IdeFleming as soon as possible - azithromycin (ZITHROMAX Z-PAK) 250 MG tablet; Take 2 tabs on day #1 then one a day for 4 more days  Dispense: 6 each; Refill: 0 - predniSONE (STERAPRED UNI-PAK 21 TAB) 10 MG (21) TBPK tablet; Take 1 tablet (10 mg total) by mouth daily. Take daily -  6 then 5 then 4 then 3 then 2 then 1 then stop  Dispense: 21 tablet; Refill: 0 - albuterol (PROVENTIL HFA;VENTOLIN HFA) 108 (90 Base) MCG/ACT inhaler; Inhale 2 puffs into the lungs 4 (four) times daily as needed.  Dispense: 18 g; Refill: 5  2. Essential hypertension controlled   Bari EdwardLaura Charlett Merkle, MD [facility]  09/24/2015

## 2015-12-24 ENCOUNTER — Other Ambulatory Visit: Payer: Self-pay | Admitting: Internal Medicine

## 2015-12-24 ENCOUNTER — Telehealth: Payer: Self-pay

## 2015-12-24 DIAGNOSIS — J45901 Unspecified asthma with (acute) exacerbation: Secondary | ICD-10-CM

## 2015-12-24 NOTE — Telephone Encounter (Signed)
Referral placed.

## 2015-12-24 NOTE — Telephone Encounter (Signed)
Per Lowella BandyNikki: Patient not satisfied with Dr. Mayo AoFlemming and wants a new referral to [facility] if possible. Lowella Bandyikki states patient sounded horrible. Please let her know once referral has been ordered or any concerns.

## 2015-12-27 ENCOUNTER — Ambulatory Visit (INDEPENDENT_AMBULATORY_CARE_PROVIDER_SITE_OTHER): Payer: Medicare Other | Admitting: Internal Medicine

## 2015-12-27 ENCOUNTER — Encounter: Payer: Self-pay | Admitting: Internal Medicine

## 2015-12-27 VITALS — BP 142/86 | HR 93 | Ht 72.0 in | Wt 166.0 lb

## 2015-12-27 DIAGNOSIS — J45901 Unspecified asthma with (acute) exacerbation: Secondary | ICD-10-CM | POA: Diagnosis not present

## 2015-12-27 DIAGNOSIS — J449 Chronic obstructive pulmonary disease, unspecified: Secondary | ICD-10-CM

## 2015-12-27 DIAGNOSIS — R918 Other nonspecific abnormal finding of lung field: Secondary | ICD-10-CM | POA: Diagnosis not present

## 2015-12-27 DIAGNOSIS — J45909 Unspecified asthma, uncomplicated: Secondary | ICD-10-CM

## 2015-12-27 DIAGNOSIS — R05 Cough: Secondary | ICD-10-CM

## 2015-12-27 DIAGNOSIS — R053 Chronic cough: Secondary | ICD-10-CM | POA: Insufficient documentation

## 2015-12-27 NOTE — Patient Instructions (Addendum)
Follow up with Dr. Dema SeverinMungal in:2 months - CT Chest w/o contrast prior to follow up - chronic cough, hx of COPD/Asthma - Barium swallow study prior to follow up visit - cont with your current inhalers - Breo/Sprivia - please gargle and rinse after each use.  - complete out your current prednisone taper - cont with current allergy regiment.

## 2015-12-27 NOTE — Progress Notes (Signed)
[facility]    Date: 12/27/2015  MRN# [id number] [facility] January 25, 1956  Referring Physician: Dr. Judithann [facility] PMD - Dr. Earnest [facility] is a 60 y.o. old male seen in [facility] for chronic shortness of breath, transitioning care from Dr. Meredeth [facility] office.  CC:  Chief Complaint  Patient presents with  . Advice Only    ref by [facility]; SOB w/activity; prod cough w/yellow mucus; chest tightness; pain with cough in center of chest    HPI:  Patient is a 60 year old male past medical history of COPD/asthma, hypertension, chronic dyspnea, acid reflux disease, essential tremors status post stimulator, seen in [facility] for chronic shortness of breath/cough/transition of care. Has been told in the past that he may have COPD/Asthma Also reports difficulty swallowing at times.  Has a history of prolonged seasonal allergies, follows with [facility] (ENT), states that he was had 2-3 sinus surgeries. Also has a hx of essential tremors with leg jerking and twitching, since childhood, s/p bilateral VIM DBS, ie brain stimulator.  Cough more when laying flat, and coughing more in the morning, sour burping the AM also.  Has a wood stove in his garage - but does not use it that often Used to burn his trash, but would wear a "[place]" gas mask, that he bought at the [employer], when he would burn trash.  Last burned trash fall 2015. PMD recently started him on a prednisone dose pak. Now with moderate improvement. States that he has year round allergies.  Currently takes Claritin and Singulair.  Symptoms are worst during fall and spring Never smoker, has been exposed to 2nd smoke for a number of years.  Chewed tobacco for about 30 years.   Review of chart by Dr. Dema [facility] Dr. Meredeth [facility] Office visit 09/2015 History of Present Illness: [facility] is a 60 y.o. male that presents to clinic for cough, wheezing, occasional shortness of breath. On ace inhibitor. Mild  rhinitis, no gerd. Phlegm thick. No choking spells.   Current Medications:  Current Outpatient Prescriptions  Medication Sig Dispense Refill  . albuterol (VENTOLIN HFA) 90 mcg/actuation inhaler 2 spray by inhalation every 4 hours as needed 2  . amLODIPine (NORVASC) 10 MG tablet 1 tab by mouth daily 5  . fluticasone (FLONASE) 50 mcg/actuation nasal spray 2 spray in each nostril daily for drainage as needed 1  . fluticasone-vilanterol (BREO ELLIPTA) 100-25 mcg/dose DsDv inhaler Inhale 1 inhalation into the lungs once daily.  Marland Kitchen. gabapentin (NEURONTIN) 600 MG tablet Take 600 mg by mouth 4 (four) times daily.  . hydrochlorothiazide (HYDRODIURIL) 25 MG tablet 1 tab by mouth every morning 6  . lisinopril (PRINIVIL,ZESTRIL) 40 MG tablet 1 tab by mouth daily 6  . montelukast (SINGULAIR) 10 mg tablet 1 tab by mouth daily 1  . rOPINIRole (REQUIP) 2 MG tablet 1 tab by mouth 2 times a day 2  . tiotropium (SPIRIVA) 18 mcg inhalation capsule Place 1 capsule (18 mcg total) into inhaler and inhale once daily. 30 capsule 11   General: NAD. Able to speak in complete sentences without cough or dyspnea HEENT: Normocephalic, nontraumatic. Extraocular movements intact NECK: Supple. No JVD, nodes, thryomegaly CV: RRR no murmurs, gallops, rubs PULM: Normal respiratory effort, mild wheezing EXTREMITIES: No significant edema, cyanosis or Homans'signs SKIN: Fair turgor. No rashes LYMPHATIC: No nodes NEURO: No gross deficits PSYCH: Appropriate affect   Impression: Cough, sob, wheezing, copd/reactive airway disease. He is not smoking, mini flare today   Plan: Continue above pulmonary  regimen  Mucinex, claritin, flonase prednisone 10 mg q day  solumedrol 40 mg im x 1 now Follow up in 2 months   PMHX:   Past Medical History  Diagnosis Date  . Hypertension   . Anxiety   . Depression   . Shortness of breath     when allergies flare up  . Pneumonia     many years ago  . GERD (gastroesophageal reflux  disease)   . Headache(784.0)   . Neuromuscular disorder (HCC)     hx of tremors  . Chronic pain due to trauma   . Allergy     Hay fever  . Urine incontinence   . Wears glasses   . Inguinal hernia     bilateral   Surgical Hx:  Past Surgical History  Procedure Laterality Date  . Nasal sinus surgery    . Deep brain stimulator placement    . Subthalamic stimulator battery replacement  12/09/2011    Procedure: SUBTHALAMIC STIMULATOR BATTERY REPLACEMENT;  Surgeon: David Harman, MD;  Location: [facility];  Service: Neurosurgery;  Laterality: N/A;   deep brain stimulator, implantable pulse generator change  . Inguinal hernia repair Bilateral 02/07/2014    Procedure:  BILATERAL INGUINAL HERNIA REPAIR;  Surgeon: David Filler, MD;  Location: [facility] OR;  Service: General;  Laterality: Bilateral;  . Insertion of mesh Bilateral 02/07/2014    Procedure: INSERTION OF MESH;  Surgeon: David Filler, MD;  Location: [facility] OR;  Service: General;  Laterality: Bilateral;  . Subthalamic stimulator battery replacement Left 04/21/2014    Procedure: Deep brain stimulator battery change;  Surgeon: David Harman, MD;  Location: [facility];  Service: Neurosurgery;  Laterality: Left;  Deep brain stimulator battery change   Family Hx:  Family History  Problem Relation Age of Onset  . Myoclonus Mother   . Breast cancer Mother   . Prostate cancer Father   . Myoclonus Maternal Uncle   . Heart disease Paternal Grandfather    Social Hx:   Social History  Substance Use Topics  . Smoking status: Never Smoker   . Smokeless tobacco: Current User    Types: Snuff  . Alcohol Use: No   Medication:   Current Outpatient Rx  Name  Route  Sig  Dispense  Refill  . albuterol (PROVENTIL HFA;VENTOLIN HFA) 108 (90 Base) MCG/ACT inhaler   Inhalation   Inhale 2 puffs into the lungs 4 (four) times daily as needed.   18 g   5   . amLODipine (NORVASC) 10 MG tablet   Oral   Take 10 mg by mouth daily.         Marland Kitchen  azithromycin (ZITHROMAX Z-PAK) 250 MG tablet      Take 2 tabs on day #1 then one a day for 4 more days   6 each   0   . fexofenadine (ALLEGRA) 180 MG tablet   Oral   Take 180 mg by mouth daily.         . fluticasone (FLOVENT HFA) 110 MCG/ACT inhaler   Inhalation   Inhale 2 puffs into the lungs 2 times daily at 12 noon and 4 pm.   1 Inhaler   12   . Fluticasone Furoate-Vilanterol 100-25 MCG/INH AEPB   Inhalation   Inhale 1 puff into the lungs daily.         Marland Kitchen gabapentin (NEURONTIN) 600 MG tablet      TAKE 1 TABLET BY MOUTH FOUR TIMES DAILY   360 tablet  5     **Patient requests 90 days supply**   . levocetirizine (XYZAL) 5 MG tablet      TK 1 T PO QD      11   . lisinopril (PRINIVIL,ZESTRIL) 40 MG tablet   Oral   Take 40 mg by mouth daily.         . montelukast (SINGULAIR) 10 MG tablet   Oral   Take 10 mg by mouth at bedtime.         . Omeprazole 20 MG TBEC   Oral   Take 1 tablet by mouth daily.         . predniSONE (STERAPRED UNI-PAK 21 TAB) 10 MG (21) TBPK tablet   Oral   Take 1 tablet (10 mg total) by mouth daily. Take daily -  6 then 5 then 4 then 3 then 2 then 1 then stop   21 tablet   0   . rOPINIRole (REQUIP) 2 MG tablet   Oral   Take 1 tablet (2 mg total) by mouth 2 (two) times daily.   60 tablet   5   . EXPIRED: tiotropium (SPIRIVA) 18 MCG inhalation capsule   Inhalation   Place 1 capsule into inhaler and inhale daily.             Allergies:  Penicillins  Review of Systems  Constitutional: Negative for fever and chills.  HENT: Positive for congestion. Negative for nosebleeds.   Eyes: Negative for blurred vision and double vision.  Respiratory: Positive for cough, sputum production, shortness of breath and wheezing. Negative for stridor.   Gastrointestinal: Negative for heartburn and nausea.  Genitourinary: Negative for dysuria.  Musculoskeletal: Negative for myalgias.  Skin: Negative for rash.  Endo/Heme/Allergies: Does  not bruise/bleed easily.  Psychiatric/Behavioral: Negative for depression.     Physical Examination:   VS: BP 142/86 mmHg  Pulse 93  Ht 6' (1.829 m)  Wt 166 lb (75.297 kg)  BMI 22.51 kg/m2  SpO2 94%  General Appearance: No distress  Neuro:without focal findings, mental status, speech normal, alert and oriented, cranial nerves 2-12 intact, reflexes normal and symmetric, sensation grossly normal  HEENT: PERRLA, EOM intact, no ptosis, no other lesions noticed; Mallampati 2 Pulmonary: normal breath sounds., diaphragmatic excursion normal.No wheezing, No rales;   Sputum Production:  None CardiovascularNormal S1,S2.  No m/r/g.  Abdominal aorta pulsation normal.    Abdomen: Benign, Soft, non-tender, No masses, hepatosplenomegaly, No lymphadenopathy Renal:  No costovertebral tenderness  GU:  No performed at this time. Endoc: No evident thyromegaly, no signs of acromegaly or Cushing features Skin:   warm, no rashes, no ecchymosis  Extremities: normal, no cyanosis, clubbing, no edema, warm with normal capillary refill. Other findings: None   Labs results:   Rad results: (The following images and results were reviewed by Dr. Dema Severin on 12/27/2015). CTA Chest 03/2015 EXAM: CTA Chest for Pulmonary Embolus DATE: 04/19/15 04:44:29 ACCESSION: [id number] UN DICTATED: 04/19/15 05:07:15 INTERPRETATION LOCATION: [facility]  CLINICAL INDICATION: 60 Year Old (M): SOB. Per chart review, 5 months of progressively worsening shortness of breath, now unable to say more than a few words without pausing to breathe.  COMPARISON: Chest radiograph 04/19/15.  TECHNIQUE: A spiral CTA scan was obtained with IV contrast from the thoracic inlet through the hemidiaphragms. Images were reconstructed in the axial plane. Multiplanar reformatted and MIP images are provided for further evaluation of the pulmonary vasculature.  FINDINGS:   SUPPORT DEVICES: Deep brain stimulator generator in the left anterior chest  wall soft tissues; leads traverse superiorly out of the field-of-view.  THYROID: 1.3 cm hypoattenuating lesion in the posterior aspect of the left thyroid lobe (5:4).  MEDIASTINUM & VASCULATURE: No evidence of pulmonary embolism identified. The main pulmonary artery is normal in caliber. No evidence of right ventricular enlargement. Apparent filling defect in the distal left brachiocephalic vein, SVC and right brachiocephalic vein may be attributable to mixing artifact, but cannot exclude partial occlusion. Reflux of contrast is seen into the intra-hepatic IVC. Unremarkable appearance of the heart and great vessels, with 3 vessel arch anatomy. Trace amount of fluid in the pericardial recess.  LYMPH NODES: No mediastinal, hilar or axillary lymphadenopathy by size criteria.  LUNGS & PLEURA: The central airways are patent, but tiny nodular densities are noted along the trachea (5:17, 27). Mild basilar predominant bronchiectasis. Pulmonary nodules as follows:   --A 2 mm nodule in the left upper lobe (5:46).  -- A 4 mm nodule in the left upper lobe (5:69).  -- A 4 mm nodule in the left upper lobe (5:72).  -- A 2 mm nodule in the right lower lobe (5:107).  -- A pleural-based nodule in the left lower lobe (5:114).  There is no pulmonary mass, consolidation, edema, or effusion.  UPPER ABDOMEN: Unremarkable.  EXTERNAL SOFT TISSUES & BONES: There is no suspicious osseous lesion. There is no evidence of acute osseous abnormality. Probable hemangioma in the C7 vertebral body. Chronic right-sided rib fracture deformities. Mild osseous degenerative changes.  IMPRESSION: -- No pulmonary embolism identified.  -- Irregular filling defect in the SVC, right and left brachiocephalic veins may be artifactual, but cannot exclude partially occluding thrombus. Ultrasound may be helpful to further characterize.  -- Tiny nodular densities along the interior wall of the trachea. One is dependent, and may represent  ingested material, but another is noted along the anterolateral left wall. Etiologies include papillomatosis, among others.  -- Mild basilar predominant bronchiectasis. -- Multiple sub-625mm pulmonary nodules. In the absence of risk factors, no followup is required; 12 month followup chest CT is indicated in high risk patients per [facility] criteria.  ATTENDING ADDENDUM -- Scattered, peripheral areas of segmental and subsegmental mucus impact bronchi -- Irregular filling defect described in the SVC, right and left brachiocephalic veins is most likely artifactual, unlikely to represent thrombus. --Dependent tracheal lesion probably represents debris. Tracheal lesion along the right lateral wall probably represents adherent debris, less likely polyp.      Last Spirometry at Dr. Meredeth [facility] office:   [facility], 70% FEV1 was 2.80., 75% FEV 1 ratio was 86% FEF 25-75% was 83% TLC 92% RV 106% DLCO was 132% Flow loop normal Impression - lung volumes and diffusion capacity are in the the normal range   Other:   Assessment and Plan: 60 year old male past medical history of COPD/asthma, reactive airway disease, recurrent allergies, seen in [facility] for COPD/optimization, chronic cough, transition care from Dr. Meredeth [facility] Reactive airway disease with acute exacerbation I suspect the patient has a combination of COPD/asthma, treatment at this time will be the same. He may have a component of silent reflux is causing reactive airway disease also.  Plan is as follows: - CT Chest w/o contrast prior to follow up - chronic cough, hx of COPD/Asthma - Barium swallow study prior to follow up visit - cont with your current inhalers - Breo/Sprivia - please gargle and rinse after each use.  - complete out your current prednisone taper - cont with current allergy regiment.   COPD  with asthma [facility]) Patient is a never smoker, however does have prolonged tobacco chewing history along with being exposed to  burning trash and wood-burning stove; these could preclude him to COPD. He is currently completing a prednisone taper for suspected asthma/COPD exacerbation. I also suspect that he may have a component of silent reflux.  Plan: - CT Chest w/o contrast prior to follow up - chronic cough, hx of COPD/Asthma - Barium swallow study prior to follow up visit - cont with your current inhalers - Breo/Sprivia - please gargle and rinse after each use.  - complete out your current prednisone taper - cont with current allergy regiment.   Chronic cough Multifactorial: COPD/asthma/recurrent allergies/silent reflux  Plan: -Continue current allergy regiment -Barium swallow study -CT chest -Avoid all forms of tobacco and noxious substances -  Pulmonary nodules Multiple pulmonary nodules A cells CT scan from October 2016. These are subcentimeter nodules the majority of them on the left side. Plan: -CT chest without contrast prior to follow-up visit    Updated Medication List Outpatient Encounter Prescriptions as of 12/27/2015  Medication Sig  . albuterol (PROVENTIL HFA;VENTOLIN HFA) 108 (90 Base) MCG/ACT inhaler Inhale 2 puffs into the lungs 4 (four) times daily as needed.  Marland Kitchen amLODipine (NORVASC) 10 MG tablet Take 10 mg by mouth daily.  Marland Kitchen azithromycin (ZITHROMAX Z-PAK) 250 MG tablet Take 2 tabs on day #1 then one a day for 4 more days  . fexofenadine (ALLEGRA) 180 MG tablet Take 180 mg by mouth daily.  . fluticasone (FLOVENT HFA) 110 MCG/ACT inhaler Inhale 2 puffs into the lungs 2 times daily at 12 noon and 4 pm.  . Fluticasone Furoate-Vilanterol 100-25 MCG/INH AEPB Inhale 1 puff into the lungs daily.  Marland Kitchen gabapentin (NEURONTIN) 600 MG tablet TAKE 1 TABLET BY MOUTH FOUR TIMES DAILY  . levocetirizine (XYZAL) 5 MG tablet TK 1 T PO QD  . lisinopril (PRINIVIL,ZESTRIL) 40 MG tablet Take 40 mg by mouth daily.  . montelukast (SINGULAIR) 10 MG tablet Take 10 mg by mouth at bedtime.  . Omeprazole 20 MG TBEC  Take 1 tablet by mouth daily.  . predniSONE (STERAPRED UNI-PAK 21 TAB) 10 MG (21) TBPK tablet Take 1 tablet (10 mg total) by mouth daily. Take daily -  6 then 5 then 4 then 3 then 2 then 1 then stop  . rOPINIRole (REQUIP) 2 MG tablet Take 1 tablet (2 mg total) by mouth 2 (two) times daily.  Marland Kitchen tiotropium (SPIRIVA) 18 MCG inhalation capsule Place 1 capsule into inhaler and inhale daily.   No facility-administered encounter medications on file as of 12/27/2015.    Orders for this visit: No orders of the defined types were placed in this encounter.     Thank  you for the [facility] and for allowing [facility] to assist in the care of your patient. Our recommendations are noted above.  Please contact us if we can be of further service.   Stephanie Acre, MD [facility] Office Number: [phone]  Note: This note was prepared with Dragon dictation along with smaller phrase technology. Any transcriptional errors that result from this process are unintentional.

## 2015-12-31 DIAGNOSIS — R918 Other nonspecific abnormal finding of lung field: Secondary | ICD-10-CM | POA: Insufficient documentation

## 2015-12-31 NOTE — Assessment & Plan Note (Signed)
Multifactorial: COPD/asthma/recurrent allergies/silent reflux  Plan: -Continue current allergy regiment -Barium swallow study -CT chest -Avoid all forms of tobacco and noxious substances -

## 2015-12-31 NOTE — Assessment & Plan Note (Signed)
Multiple pulmonary nodules A cells CT scan from October 2016. These are subcentimeter nodules the majority of them on the left side. Plan: -CT chest without contrast prior to follow-up visit

## 2015-12-31 NOTE — Assessment & Plan Note (Signed)
Patient is a never smoker, however does have prolonged tobacco chewing history along with being exposed to burning trash and wood-burning stove; these could preclude him to COPD. He is currently completing a prednisone taper for suspected asthma/COPD exacerbation. I also suspect that he may have a component of silent reflux.  Plan: - CT Chest w/o contrast prior to follow up - chronic cough, hx of COPD/Asthma - Barium swallow study prior to follow up visit - cont with your current inhalers - Breo/Sprivia - please gargle and rinse after each use.  - complete out your current prednisone taper - cont with current allergy regiment.

## 2015-12-31 NOTE — Assessment & Plan Note (Signed)
I suspect the patient has a combination of COPD/asthma, treatment at this time will be the same. He may have a component of silent reflux is causing reactive airway disease also.  Plan is as follows: - CT Chest w/o contrast prior to follow up - chronic cough, hx of COPD/Asthma - Barium swallow study prior to follow up visit - cont with your current inhalers - Breo/Sprivia - please gargle and rinse after each use.  - complete out your current prednisone taper - cont with current allergy regiment.

## 2016-02-04 ENCOUNTER — Ambulatory Visit: Payer: Medicare Other | Attending: Internal Medicine

## 2016-02-09 ENCOUNTER — Other Ambulatory Visit: Payer: Self-pay | Admitting: Internal Medicine

## 2016-02-18 ENCOUNTER — Ambulatory Visit
Admission: RE | Admit: 2016-02-18 | Discharge: 2016-02-18 | Disposition: A | Discharge status: Home / Self Care | Payer: Medicare Other | Source: Ambulatory Visit | Attending: Internal Medicine | Admitting: Internal Medicine

## 2016-02-18 DIAGNOSIS — J449 Chronic obstructive pulmonary disease, unspecified: Secondary | ICD-10-CM

## 2016-02-18 DIAGNOSIS — J45901 Unspecified asthma with (acute) exacerbation: Secondary | ICD-10-CM | POA: Insufficient documentation
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
## 2016-02-21 ENCOUNTER — Encounter: Payer: Self-pay | Admitting: Neurology

## 2016-02-21 ENCOUNTER — Ambulatory Visit (INDEPENDENT_AMBULATORY_CARE_PROVIDER_SITE_OTHER): Payer: Medicare Other | Admitting: Neurology

## 2016-02-21 VITALS — BP 124/80 | HR 103 | Ht 72.0 in | Wt 163.0 lb

## 2016-02-21 DIAGNOSIS — G2581 Restless legs syndrome: Secondary | ICD-10-CM

## 2016-02-21 DIAGNOSIS — G250 Essential tremor: Secondary | ICD-10-CM | POA: Diagnosis not present

## 2016-02-21 NOTE — Procedures (Signed)
DBS Programming was performed.    Total time spent programming was 45 minutes.  Device was confirmed to be on.  Soft start was confirmed to be on.  Impedences were checked and were within normal limits.  Battery was checked and was determined to be functioning normally and not near the end of life (2.74).  Final settings were as follows:  Group A:  Left brain electrode:     2-1+           ; Amplitude  3.7   V   ; Pulse width 90 microseconds;   Frequency   135   Hz.  Right brain electrode:     7-C+          ; Amplitude   3.4  V ;  Pulse width 110  microseconds;  Frequency   135    Hz.  Group B  Left brain electrode:     2-C+           ; Amplitude  4.5   V   ; Pulse width 60 microseconds;   Frequency   130   Hz.  Right brain electrode:     7-C+          ; Amplitude   3.4  V ;  Pulse width 90  microseconds;  Frequency   130    Hz.  Group B was active when pt left the office

## 2016-02-25 ENCOUNTER — Ambulatory Visit (INDEPENDENT_AMBULATORY_CARE_PROVIDER_SITE_OTHER): Payer: Medicare Other | Admitting: Internal Medicine

## 2016-02-25 ENCOUNTER — Encounter: Payer: Self-pay | Admitting: Internal Medicine

## 2016-02-25 VITALS — BP 138/98 | HR 98 | Ht 72.0 in | Wt 164.0 lb

## 2016-02-25 DIAGNOSIS — J45909 Unspecified asthma, uncomplicated: Secondary | ICD-10-CM | POA: Diagnosis not present

## 2016-02-25 DIAGNOSIS — J449 Chronic obstructive pulmonary disease, unspecified: Secondary | ICD-10-CM

## 2016-02-25 DIAGNOSIS — R918 Other nonspecific abnormal finding of lung field: Secondary | ICD-10-CM | POA: Diagnosis not present

## 2016-02-25 DIAGNOSIS — J441 Chronic obstructive pulmonary disease with (acute) exacerbation: Secondary | ICD-10-CM | POA: Diagnosis not present

## 2016-02-25 MED ORDER — AZITHROMYCIN 250 MG PO TABS: ORAL_TABLET | ORAL | 0 refills | Status: AC

## 2016-02-25 MED ORDER — IPRATROPIUM-ALBUTEROL 0.5-2.5 (3) MG/3ML IN SOLN
3.0000 mL | Freq: Once | RESPIRATORY_TRACT | Status: AC
  Administered 2016-02-25: 3 mL via RESPIRATORY_TRACT

## 2016-02-25 MED ORDER — PREDNISONE 20 MG PO TABS: 20.0000 mg | ORAL_TABLET | Freq: Every day | ORAL | 0 refills | Status: DC

## 2016-02-25 NOTE — Assessment & Plan Note (Signed)
Multiple pulmonary nodules  CT scan from October 2016. These are subcentimeter nodules the majority of them on the left side. Repeat CT in 01/2016 with multiple small <812mm nodules mainly in the upper lobes, I believe that these are mostly reactive nodules at this time.    Plan: - currently with active URI, no repeat CT at this time, also these are subcentimeter nodules, likely reactive, will plan for a 1 year follow up.

## 2016-02-25 NOTE — Progress Notes (Signed)
[facility] Pulmonary Medicine Consultation      MRN# [id number] David Rhodes 1956-05-23   CC: Chief Complaint  Patient presents with  . Reactive Airway Disease    Breathing is worse since last OV. Reports increased SOB, coughing and wheezing. Cough is producing yellow mucus.      Brief History: 60 year old male past regular history of COPD/asthma, hypertension, follow-up with pulmonary for COPD/pulmonary nodules.   Events since last clinic visit: Patient presents today for follow up visit. He has a CT Chest to follow up on.  Today with complaints of cough, congestion, thick greenish and yellow sputum, feeling a little better today, but stated was worst over the weekend.  PMD called in prednisone, but patient has not been able to afford the meds. Still exposed to dogs daily.    Current Outpatient Prescriptions:  .  albuterol (PROVENTIL HFA;VENTOLIN HFA) 108 (90 Base) MCG/ACT inhaler, Inhale 2 puffs into the lungs 4 (four) times daily as needed., Disp: 18 g, Rfl: 5 .  amLODipine (NORVASC) 10 MG tablet, Take 10 mg by mouth daily., Disp: , Rfl:  .  azithromycin (ZITHROMAX Z-PAK) 250 MG tablet, Take 2 tabs on day #1 then one a day for 4 more days, Disp: 6 each, Rfl: 0 .  fexofenadine (ALLEGRA) 180 MG tablet, Take 180 mg by mouth daily., Disp: , Rfl:  .  fluticasone (FLOVENT HFA) 110 MCG/ACT inhaler, Inhale 2 puffs into the lungs 2 times daily at 12 noon and 4 pm., Disp: 1 Inhaler, Rfl: 12 .  Fluticasone Furoate-Vilanterol 100-25 MCG/INH AEPB, Inhale 1 puff into the lungs daily., Disp: , Rfl:  .  gabapentin (NEURONTIN) 600 MG tablet, TAKE 1 TABLET BY MOUTH FOUR TIMES DAILY, Disp: 360 tablet, Rfl: 0 .  levocetirizine (XYZAL) 5 MG tablet, TK 1 T PO QD, Disp: , Rfl: 11 .  lisinopril (PRINIVIL,ZESTRIL) 40 MG tablet, Take 40 mg by mouth daily., Disp: , Rfl:  .  montelukast (SINGULAIR) 10 MG tablet, Take 10 mg by mouth at bedtime., Disp: , Rfl:  .  Omeprazole 20 MG TBEC, Take 1 tablet  by mouth daily., Disp: , Rfl:  .  predniSONE (STERAPRED UNI-PAK 21 TAB) 10 MG (21) TBPK tablet, Take 1 tablet (10 mg total) by mouth daily. Take daily -  6 then 5 then 4 then 3 then 2 then 1 then stop, Disp: 21 tablet, Rfl: 0 .  rOPINIRole (REQUIP) 2 MG tablet, TAKE 1 TABLET(2 MG) BY MOUTH TWICE DAILY, Disp: 60 tablet, Rfl: 5 .  tiotropium (SPIRIVA) 18 MCG inhalation capsule, Place 1 capsule into inhaler and inhale daily., Disp: , Rfl:    Review of Systems  Constitutional: Negative for chills and fever.  Eyes: Negative for blurred vision.  Respiratory: Positive for cough, sputum production and shortness of breath.   Gastrointestinal: Negative for heartburn, nausea and vomiting.  Neurological: Negative for dizziness and headaches.  Endo/Heme/Allergies: Negative for environmental allergies. Does not bruise/bleed easily.      Allergies:  Penicillins  Physical Examination:  VS: BP (!) 138/98 (BP Location: Right Arm, Cuff Size: Normal)   Pulse 98   Ht 6' (1.829 m)   Wt 164 lb (74.4 kg)   SpO2 95%   BMI 22.24 kg/m   General Appearance: No distress  HEENT: PERRLA, no ptosis, no other lesions noticed Pulmonary: Pre BD: shallow BS, diffuse wheezing, especially at the bases.    Post BD: improved BS to the bases, mild wheezing, improved air movement Cardiovascular:  Normal S1,S2.  No m/r/g.     Abdomen:Exam: Benign, Soft, non-tender, No masses  Skin:   warm, no rashes, no ecchymosis  Extremities: normal, no cyanosis, clubbing, warm with normal capillary refill.      Rad results: (The following images and results were reviewed by Dr. Dema Severin on 02/25/2016). CT CHEST WITHOUT CONTRAST  TECHNIQUE: Multidetector CT imaging of the chest was performed following the standard protocol without IV contrast.  COMPARISON:  July 02, 2015 chest radiograph  FINDINGS: Cardiovascular: There is no demonstrable thoracic aortic aneurysm. There is minimal calcification in the right common  carotid artery. Visualized great vessels otherwise appear unremarkable on this noncontrast enhanced study. Pericardium is not appreciably thickened. There is a small amount of coronary artery calcification noted.  Mediastinum/Nodes: Visualized thyroid appears normal. There is no appreciable thoracic adenopathy.  Lungs/Pleura: There is slight apical scarring bilaterally. There is mild upper and lower lobe bronchiectatic change bilaterally. There is no edema or consolidation. On axial slice 47 series 3, there is a 2 mm nodular opacity in the posterior segment of the right upper lobe seen on axial slice 47 series 3. On axial slice 68 series 3, there is a 2 mm nodular opacity anterior segment left upper lobe. On axial slice 76 series 3, there is a 2 mm nodular opacity in the anterior segment right upper lobe. On axial slice 41 series 3, there is a 2 mm nodular opacity in the posterior segment right upper lobe. There is no appreciable bullous disease.  Upper Abdomen: In the visualized upper abdomen, there is mild atherosclerotic calcification in the aorta. Visualized upper abdominal structures otherwise appear unremarkable.  Musculoskeletal: There is mid thoracic dextroscoliosis. There are no blastic or lytic bone lesions. There is a stimulator on the left anteriorly with lead extending above the chest.  IMPRESSION: No edema or consolidation. Areas of bronchiectatic change bilaterally. Several 2 mm nodular opacities as noted above. No larger lesions evident. No follow-up needed if patient is low-risk. Non-contrast chest CT can be considered in 12 months if patient is high-risk. This recommendation follows the consensus statement: Guidelines for Management of Incidental Pulmonary Nodules Detected on CT Images:From the Fleischner Society 2017; published online before print (10.1148/radiol.[id number]).  No adenopathy. Scattered areas of atherosclerotic calcification. Minimal  coronary artery calcification noted.      Assessment and Plan: COPD exacerbation (HCC) 2 weeks of sob, cough, productive sputum production - wheezing today Trigger - heat and constant exposure to his dog.   Got 2 duonebs in office, with significant improvement.   Plan 1. Prednisone 20mg  x 5 days 2. Zpak 3. Use albuterol 2 puff every 6 hrs for the next 3 days, then as needed only.   Pulmonary nodules Multiple pulmonary nodules  CT scan from October 2016. These are subcentimeter nodules the majority of them on the left side. Repeat CT in 01/2016 with multiple small <2mm nodules mainly in the upper lobes, I believe that these are mostly reactive nodules at this time.    Plan: - currently with active URI, no repeat CT at this time, also these are subcentimeter nodules, likely reactive, will plan for a 1 year follow up.    Updated Medication List Outpatient Encounter Prescriptions as of 02/25/2016  Medication Sig  . albuterol (PROVENTIL HFA;VENTOLIN HFA) 108 (90 Base) MCG/ACT inhaler Inhale 2 puffs into the lungs 4 (four) times daily as needed.  Marland Kitchen amLODipine (NORVASC) 10 MG tablet Take 10 mg by mouth daily.  Marland Kitchen azithromycin (ZITHROMAX Z-PAK) 250 MG tablet Take  2 tabs on day #1 then one a day for 4 more days  . fexofenadine (ALLEGRA) 180 MG tablet Take 180 mg by mouth daily.  . fluticasone (FLOVENT HFA) 110 MCG/ACT inhaler Inhale 2 puffs into the lungs 2 times daily at 12 noon and 4 pm.  . Fluticasone Furoate-Vilanterol 100-25 MCG/INH AEPB Inhale 1 puff into the lungs daily.  Marland Kitchen. gabapentin (NEURONTIN) 600 MG tablet TAKE 1 TABLET BY MOUTH FOUR TIMES DAILY  . levocetirizine (XYZAL) 5 MG tablet TK 1 T PO QD  . lisinopril (PRINIVIL,ZESTRIL) 40 MG tablet Take 40 mg by mouth daily.  . montelukast (SINGULAIR) 10 MG tablet Take 10 mg by mouth at bedtime.  . Omeprazole 20 MG TBEC Take 1 tablet by mouth daily.  . predniSONE (STERAPRED UNI-PAK 21 TAB) 10 MG (21) TBPK tablet Take 1 tablet (10 mg  total) by mouth daily. Take daily -  6 then 5 then 4 then 3 then 2 then 1 then stop  . rOPINIRole (REQUIP) 2 MG tablet TAKE 1 TABLET(2 MG) BY MOUTH TWICE DAILY  . tiotropium (SPIRIVA) 18 MCG inhalation capsule Place 1 capsule into inhaler and inhale daily.   No facility-administered encounter medications on file as of 02/25/2016.     Orders for this visit: No orders of the defined types were placed in this encounter.   Thank  you for the visitation and for allowing  [facility] to assist in the care of your patient. Our recommendations are noted above.  Please contact us if we can be of further service.  Stephanie AcreVishal Aster Screws, MD [facility] Office Number: [phone]  Note: This note was prepared with Dragon dictation along with smaller phrase technology. Any transcriptional errors that result from this process are unintentional.

## 2016-02-25 NOTE — Patient Instructions (Addendum)
Follow up with Dr. Dema SeverinMungal in:3 months - prednisone 20mg  x 5 days - 1 tab daily - Zpak - use as directed (pharmacy can bottle tabs, due to patient having problems peeling the pack open).  - albuterol inhaler - 2puff every 6 hours for 3 days, then 2puff as needed every 3-4 hours as needed for shortness of breath\wheezing\recurrent cough - please try to avoid dogs, as this is triggering your asthma/copd

## 2016-02-25 NOTE — Assessment & Plan Note (Addendum)
2 weeks of sob, cough, productive sputum production - wheezing today Trigger - heat and constant exposure to his dog.   Got 2 duonebs in office, with significant improvement.   Plan 1. Prednisone 20mg  x 5 days 2. Zpak 3. Use albuterol 2 puff every 6 hrs for the next 3 days, then as needed only.

## 2016-03-01 ENCOUNTER — Other Ambulatory Visit: Payer: Self-pay | Admitting: Internal Medicine

## 2016-03-29 ENCOUNTER — Ambulatory Visit
Admission: EM | Admit: 2016-03-29 | Discharge: 2016-03-29 | Disposition: A | Discharge status: Home / Self Care | Payer: Medicare Other | Attending: Family Medicine | Admitting: Family Medicine

## 2016-03-29 DIAGNOSIS — J441 Chronic obstructive pulmonary disease with (acute) exacerbation: Secondary | ICD-10-CM | POA: Diagnosis not present

## 2016-03-29 DIAGNOSIS — J40 Bronchitis, not specified as acute or chronic: Secondary | ICD-10-CM | POA: Diagnosis not present

## 2016-03-29 MED ORDER — PREDNISONE 10 MG PO TABS: ORAL_TABLET | ORAL | 1 refills | Status: DC

## 2016-03-29 MED ORDER — IPRATROPIUM-ALBUTEROL 0.5-2.5 (3) MG/3ML IN SOLN
3.0000 mL | Freq: Once | RESPIRATORY_TRACT | Status: AC
  Administered 2016-03-29: 3 mL via RESPIRATORY_TRACT

## 2016-03-29 MED ORDER — LEVOFLOXACIN 500 MG PO TABS: 500.0000 mg | ORAL_TABLET | Freq: Every day | ORAL | 0 refills | Status: DC

## 2016-03-29 NOTE — ED Triage Notes (Signed)
Patient started having symptoms of severe SOB 2 days ago. Patient has been diagnosed with asthma.
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
## 2016-03-31 ENCOUNTER — Telehealth: Payer: Self-pay

## 2016-03-31 NOTE — Telephone Encounter (Signed)
Courtesy call back completed today for patients recent visit at [facility]. Patient did not answer, left message on voicemail to call back with any questions or concerns.   

## 2016-04-16 ENCOUNTER — Telehealth: Payer: Self-pay | Admitting: Neurology

## 2016-04-16 NOTE — Telephone Encounter (Signed)
Patient is getting an alert on his DBS to see doctor soon. He has changed the batteries. According to the last office note you state he will probably need a battery change in the next few months. Please advise if he should see us or Dr. Venetia MaxonStern. He can be reached at [phone] or [phone].

## 2016-04-17 NOTE — Telephone Encounter (Signed)
Put him on my schedule at 2pm

## 2016-04-17 NOTE — Telephone Encounter (Signed)
Appt made with patient.  
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
## 2016-04-17 NOTE — Telephone Encounter (Signed)
Tried to call patient on both numbers. LMOM.

## 2016-04-18 ENCOUNTER — Encounter: Payer: Self-pay | Admitting: Neurology

## 2016-04-18 ENCOUNTER — Ambulatory Visit (INDEPENDENT_AMBULATORY_CARE_PROVIDER_SITE_OTHER): Payer: Medicare Other | Admitting: Neurology

## 2016-04-18 VITALS — BP 140/90 | HR 102 | Ht 72.0 in | Wt 163.0 lb

## 2016-04-18 DIAGNOSIS — G250 Essential tremor: Secondary | ICD-10-CM

## 2016-04-18 NOTE — Procedures (Signed)
DBS Programming was performed.    Total time spent programming was 45 minutes.  Device was confirmed to be on.  Soft start was confirmed to be on.  Impedences were checked and were within normal limits.  Battery was checked and was determined to be functioning normally and not near the end of life (2.74).  Final settings were as follows:  Group A:  Left brain electrode:     2-1+           ; Amplitude  3.7   V   ; Pulse width 90 microseconds;   Frequency   135   Hz.  Right brain electrode:     7-C+          ; Amplitude   3.4  V ;  Pulse width 110  microseconds;  Frequency   135    Hz.  Group B  Left brain electrode:     2-C+           ; Amplitude  4.5   V   ; Pulse width 60 microseconds;   Frequency   130   Hz.  Right brain electrode:     7-C+          ; Amplitude   3.4  V ;  Pulse width 90  microseconds;  Frequency   130    Hz.  Group B was active when pt left the office 

## 2016-04-29 ENCOUNTER — Telehealth: Payer: Self-pay | Admitting: Internal Medicine

## 2016-04-29 NOTE — Telephone Encounter (Signed)
LMOVM for pt at number listed to call back too. Tried calling 2 other numbers listed in chart and unable to LM. WCBL.

## 2016-04-29 NOTE — Telephone Encounter (Signed)
Pt would like a call, states he has a lot of SOB, spitting up blood, coughing, wheezing.

## 2016-04-30 MED ORDER — PREDNISONE 20 MG PO TABS: 40.0000 mg | ORAL_TABLET | Freq: Every day | ORAL | 0 refills | Status: DC

## 2016-04-30 NOTE — Telephone Encounter (Signed)
Pt states SOB has gotten worse over the last few days. He has prod cough withy yellow mucus and is NOT spitting up blood as listed in message below. Pt states he has been using Breo, Spiriva and Albuterol. Was seen last by VM on 8/28 for COPD and was in ER 03/29/16 for COPD exacerbation and was given Levaquin x 7 days. Please advise.

## 2016-04-30 NOTE — Telephone Encounter (Signed)
Prednisone 40 mg daily for 7 days Needs to follow up with VM

## 2016-04-30 NOTE — Telephone Encounter (Signed)
LMOM for pt to return call. 

## 2016-04-30 NOTE — Telephone Encounter (Signed)
Spoke with pt and informed him of DK's recs. appt scheduled and RX sent to pharmacy. Nothing further needed.

## 2016-05-19 ENCOUNTER — Ambulatory Visit: Payer: Medicaid Other | Admitting: Internal Medicine

## 2016-05-19 ENCOUNTER — Encounter: Payer: Self-pay | Admitting: *Deleted

## 2016-05-28 ENCOUNTER — Ambulatory Visit: Payer: Medicaid Other | Admitting: Internal Medicine

## 2016-05-28 ENCOUNTER — Encounter: Payer: Self-pay | Admitting: *Deleted

## 2016-06-02 ENCOUNTER — Other Ambulatory Visit: Payer: Self-pay | Admitting: Internal Medicine

## 2016-06-10 ENCOUNTER — Ambulatory Visit: Payer: Medicare Other | Admitting: Neurology

## 2016-06-23 ENCOUNTER — Encounter: Payer: Self-pay | Admitting: Emergency Medicine

## 2016-06-23 ENCOUNTER — Emergency Department: Payer: Medicare Other

## 2016-06-23 ENCOUNTER — Emergency Department
Admission: EM | Admit: 2016-06-23 | Discharge: 2016-06-24 | Discharge status: Against Medical Advice | Payer: Medicare Other | Attending: Student in an Organized Health Care Education/Training Program | Admitting: Student in an Organized Health Care Education/Training Program

## 2016-06-23 DIAGNOSIS — Z79899 Other long term (current) drug therapy: Secondary | ICD-10-CM | POA: Diagnosis not present

## 2016-06-23 DIAGNOSIS — F17290 Nicotine dependence, other tobacco product, uncomplicated: Secondary | ICD-10-CM | POA: Diagnosis not present

## 2016-06-23 DIAGNOSIS — I10 Essential (primary) hypertension: Secondary | ICD-10-CM | POA: Insufficient documentation

## 2016-06-23 DIAGNOSIS — R0602 Shortness of breath: Secondary | ICD-10-CM | POA: Diagnosis present

## 2016-06-23 DIAGNOSIS — J441 Chronic obstructive pulmonary disease with (acute) exacerbation: Secondary | ICD-10-CM

## 2016-06-23 LAB — COMPREHENSIVE METABOLIC PANEL
ALBUMIN: 4.2 g/dL (ref 3.5–5.0)
ALT: 16 U/L — ABNORMAL LOW (ref 17–63)
ANION GAP: 6 (ref 5–15)
AST: 26 U/L (ref 15–41)
Alkaline Phosphatase: 78 U/L (ref 38–126)
BILIRUBIN TOTAL: 0.1 mg/dL — AB (ref 0.3–1.2)
BUN: 10 mg/dL (ref 6–20)
CO2: 29 mmol/L (ref 22–32)
Calcium: 10.3 mg/dL (ref 8.9–10.3)
Chloride: 105 mmol/L (ref 101–111)
Creatinine, Ser: 1.16 mg/dL (ref 0.61–1.24)
Glucose, Bld: 117 mg/dL — ABNORMAL HIGH (ref 65–99)
POTASSIUM: 3.9 mmol/L (ref 3.5–5.1)
Sodium: 140 mmol/L (ref 135–145)
TOTAL PROTEIN: 7 g/dL (ref 6.5–8.1)

## 2016-06-23 LAB — CBC WITH DIFFERENTIAL/PLATELET
BASOS PCT: 1 %
Basophils Absolute: 0.2 10*3/uL — ABNORMAL HIGH (ref 0–0.1)
Eosinophils Absolute: 2 10*3/uL — ABNORMAL HIGH (ref 0–0.7)
Eosinophils Relative: 14 %
HEMATOCRIT: 47.5 % (ref 40.0–52.0)
Hemoglobin: 16.2 g/dL (ref 13.0–18.0)
Lymphocytes Relative: 22 %
Lymphs Abs: 3.1 10*3/uL (ref 1.0–3.6)
MCH: 29.7 pg (ref 26.0–34.0)
MCHC: 34 g/dL (ref 32.0–36.0)
MCV: 87.4 fL (ref 80.0–100.0)
MONO ABS: 1.2 10*3/uL — AB (ref 0.2–1.0)
Monocytes Relative: 8 %
NEUTROS ABS: 7.5 10*3/uL — AB (ref 1.4–6.5)
Neutrophils Relative %: 55 %
Platelets: 221 10*3/uL (ref 150–440)
RBC: 5.44 MIL/uL (ref 4.40–5.90)
RDW: 14.1 % (ref 11.5–14.5)
WBC: 13.9 10*3/uL — ABNORMAL HIGH (ref 3.8–10.6)

## 2016-06-23 LAB — TROPONIN I: Troponin I: <0.03 ng/mL (ref <0.03)

## 2016-06-23 LAB — MAGNESIUM: MAGNESIUM: 2.1 mg/dL (ref 1.7–2.4)

## 2016-06-23 MED ORDER — IPRATROPIUM-ALBUTEROL 0.5-2.5 (3) MG/3ML IN SOLN
3.0000 mL | Freq: Once | RESPIRATORY_TRACT | Status: AC
  Administered 2016-06-23: 3 mL via RESPIRATORY_TRACT
  Filled 2016-06-23: qty 3

## 2016-06-23 MED ORDER — ALBUTEROL SULFATE (2.5 MG/3ML) 0.083% IN NEBU
5.0000 mg | INHALATION_SOLUTION | Freq: Once | RESPIRATORY_TRACT | Status: AC
  Administered 2016-06-23: 5 mg via RESPIRATORY_TRACT
  Filled 2016-06-23: qty 6

## 2016-06-23 MED ORDER — AZITHROMYCIN 250 MG PO TABS: ORAL_TABLET | ORAL | 0 refills | Status: AC

## 2016-06-23 MED ORDER — ALBUTEROL SULFATE HFA 108 (90 BASE) MCG/ACT IN AERS: 2.0000 | INHALATION_SPRAY | Freq: Four times a day (QID) | RESPIRATORY_TRACT | 2 refills | Status: DC | PRN

## 2016-06-23 MED ORDER — PREDNISONE 20 MG PO TABS: 40.0000 mg | ORAL_TABLET | Freq: Every day | ORAL | 0 refills | Status: AC

## 2016-06-23 MED ORDER — IPRATROPIUM-ALBUTEROL 0.5-2.5 (3) MG/3ML IN SOLN
RESPIRATORY_TRACT | Status: AC
  Administered 2016-06-23: 3 mL
  Filled 2016-06-23: qty 3

## 2016-06-23 MED ORDER — ALBUTEROL SULFATE HFA 108 (90 BASE) MCG/ACT IN AERS
2.0000 | INHALATION_SPRAY | Freq: Once | RESPIRATORY_TRACT | Status: AC
  Administered 2016-06-23: 2 via RESPIRATORY_TRACT
  Filled 2016-06-23: qty 6.7

## 2016-06-23 MED ORDER — AZITHROMYCIN 500 MG PO TABS
500.0000 mg | ORAL_TABLET | Freq: Once | ORAL | Status: AC
  Administered 2016-06-23: 500 mg via ORAL
  Filled 2016-06-23: qty 1
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
## 2016-06-23 NOTE — ED Triage Notes (Signed)
Pt arrived via ems from fire dept where ems was called due to shortness of breath. EMS reports giving125 solu medrol and 2 duo nebs. EMS vital signs; Heart rate 110, blood pressure 160 palpated, and 99% after 2 duo nebs. Pt alert and oriented x4 but struggles to communicate due to shortness of breath. Pt has a HX of COPD, Hypertension, and asthma. Pt given duo neb upon arrival.

## 2016-06-24 NOTE — ED Notes (Signed)
Pt informed of the risk of going home and the benefits of being admitted. Pt verbally acknowledged risk factors. Pt was informed to return to ED for any worsening symptoms or new symptoms, pt verbally acknowledged.

## 2016-06-24 NOTE — ED Notes (Signed)
Patient discharge and follow up information reviewed with patient by ED nursing staff and patient given the opportunity to ask questions pertaining to ED visit and discharge plan of care. Patient advised that should symptoms not continue to improve, resolve entirely, or should new symptoms develop then a follow up visit with their PCP or a return visit to the ED may be warranted. Patient verbalized consent and understanding of discharge plan of care including potential need for further evaluation.

## 2016-06-24 NOTE — ED Notes (Addendum)
Pt ambulated with oxygen saturation ranging 90-95% room air. Pt denies weakness or dizziness. Labored breathing noted. Pt ambulated back to room with slow and steady gait. MD notified.

## 2016-06-28 LAB — BLOOD GAS, VENOUS
Acid-base deficit: 4.1 mmol/L — ABNORMAL HIGH (ref 0.0–2.0)
Bicarbonate: 24.1 mmol/L (ref 20.0–28.0)
PH VEN: 7.25 (ref 7.250–7.430)
Patient temperature: 37
pCO2, Ven: 55 mmHg (ref 44.0–60.0)
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
## 2016-07-04 ENCOUNTER — Ambulatory Visit (INDEPENDENT_AMBULATORY_CARE_PROVIDER_SITE_OTHER): Payer: Medicare Other | Admitting: Neurology

## 2016-07-04 ENCOUNTER — Encounter: Payer: Self-pay | Admitting: Neurology

## 2016-07-04 VITALS — BP 140/98 | HR 83 | Ht 72.0 in | Wt 155.0 lb

## 2016-07-04 DIAGNOSIS — G2581 Restless legs syndrome: Secondary | ICD-10-CM

## 2016-07-04 DIAGNOSIS — G250 Essential tremor: Secondary | ICD-10-CM | POA: Diagnosis not present

## 2016-07-04 NOTE — Procedures (Signed)
DBS Programming was performed.    Total time spent programming was 20 minutes.  Device was confirmed to be on.  Soft start was confirmed to be on.  Impedences were checked and were within normal limits.  Battery was checked and was determined to be functioning normally and close to end of life but not quite (2.64).  Final settings were as follows:  Group A:  Left brain electrode:     2-1+           ; Amplitude  3.7   V   ; Pulse width 90 microseconds;   Frequency   135   Hz.  Right brain electrode:     7-C+          ; Amplitude   3.4  V ;  Pulse width 110  microseconds;  Frequency   135    Hz.  Group B  Left brain electrode:     2-C+           ; Amplitude  4.5   V   ; Pulse width 60 microseconds;   Frequency   140   Hz.  Right brain electrode:     7-C+          ; Amplitude   3.4  V ;  Pulse width 90  microseconds;  Frequency   140    Hz.  Group B was active when pt left the office

## 2016-07-07 ENCOUNTER — Telehealth: Payer: Self-pay | Admitting: Neurology

## 2016-07-07 NOTE — Telephone Encounter (Signed)
Referral faxed to [facility] at [phone] with confirmation received. They will contact the patient to schedule.  

## 2016-07-30 ENCOUNTER — Telehealth: Payer: Self-pay | Admitting: Neurology

## 2016-07-30 NOTE — Telephone Encounter (Signed)
Tried to call patient back and his cell connection was not good. He will call back. He needs to call Dr. Fredrich BirksStern's office to check on status of his appt. [phone]. I faxed referral on 07/07/2016.

## 2016-07-30 NOTE — Telephone Encounter (Signed)
Patient made aware. He will contact Dr. Fredrich BirksStern's office.

## 2016-07-30 NOTE — Telephone Encounter (Signed)
Patient is checking on status of appt with stern please call [phone]

## 2016-08-24 ENCOUNTER — Emergency Department (HOSPITAL_COMMUNITY): Payer: Medicare Other

## 2016-08-24 ENCOUNTER — Encounter (HOSPITAL_COMMUNITY): Payer: Self-pay

## 2016-08-24 ENCOUNTER — Emergency Department (HOSPITAL_COMMUNITY)
Admission: EM | Admit: 2016-08-24 | Discharge: 2016-08-24 | Disposition: A | Discharge status: Home / Self Care | Payer: Medicare Other | Attending: Emergency Medicine | Admitting: Emergency Medicine

## 2016-08-24 DIAGNOSIS — I10 Essential (primary) hypertension: Secondary | ICD-10-CM | POA: Diagnosis not present

## 2016-08-24 DIAGNOSIS — Z79899 Other long term (current) drug therapy: Secondary | ICD-10-CM | POA: Diagnosis not present

## 2016-08-24 DIAGNOSIS — J449 Chronic obstructive pulmonary disease, unspecified: Secondary | ICD-10-CM | POA: Diagnosis not present

## 2016-08-24 DIAGNOSIS — R05 Cough: Secondary | ICD-10-CM | POA: Diagnosis present

## 2016-08-24 DIAGNOSIS — J189 Pneumonia, unspecified organism: Secondary | ICD-10-CM

## 2016-08-24 DIAGNOSIS — J181 Lobar pneumonia, unspecified organism: Secondary | ICD-10-CM | POA: Insufficient documentation

## 2016-08-24 HISTORY — DX: Unspecified asthma, uncomplicated: J45.909

## 2016-08-24 HISTORY — DX: Chronic obstructive pulmonary disease, unspecified: J44.9

## 2016-08-24 MED ORDER — FLUTICASONE FUROATE-VILANTEROL 100-25 MCG/INH IN AEPB: 1.0000 | INHALATION_SPRAY | Freq: Every day | RESPIRATORY_TRACT | 0 refills | Status: DC

## 2016-08-24 MED ORDER — DOXYCYCLINE HYCLATE 100 MG PO TABS
100.0000 mg | ORAL_TABLET | Freq: Once | ORAL | Status: AC
  Administered 2016-08-24: 100 mg via ORAL
  Filled 2016-08-24: qty 1

## 2016-08-24 MED ORDER — DEXAMETHASONE 4 MG PO TABS
8.0000 mg | ORAL_TABLET | Freq: Once | ORAL | Status: AC
  Administered 2016-08-24: 8 mg via ORAL
  Filled 2016-08-24: qty 2

## 2016-08-24 MED ORDER — DOXYCYCLINE HYCLATE 100 MG PO CAPS: 100.0000 mg | ORAL_CAPSULE | Freq: Two times a day (BID) | ORAL | 0 refills | Status: DC

## 2016-08-24 MED ORDER — ALBUTEROL SULFATE (2.5 MG/3ML) 0.083% IN NEBU
5.0000 mg | INHALATION_SOLUTION | Freq: Once | RESPIRATORY_TRACT | Status: AC
  Administered 2016-08-24: 5 mg via RESPIRATORY_TRACT

## 2016-08-24 MED ORDER — ALBUTEROL SULFATE HFA 108 (90 BASE) MCG/ACT IN AERS: 1.0000 | INHALATION_SPRAY | Freq: Four times a day (QID) | RESPIRATORY_TRACT | 0 refills | Status: DC | PRN

## 2016-08-24 MED ORDER — ALBUTEROL SULFATE (2.5 MG/3ML) 0.083% IN NEBU
5.0000 mg | INHALATION_SOLUTION | Freq: Once | RESPIRATORY_TRACT | Status: AC
  Administered 2016-08-24: 5 mg via RESPIRATORY_TRACT
  Filled 2016-08-24: qty 6

## 2016-08-24 MED ORDER — ALBUTEROL SULFATE (2.5 MG/3ML) 0.083% IN NEBU
INHALATION_SOLUTION | RESPIRATORY_TRACT | Status: AC
  Filled 2016-08-24: qty 6

## 2016-08-24 MED ORDER — TIOTROPIUM BROMIDE MONOHYDRATE 18 MCG IN CAPS: 18.0000 ug | ORAL_CAPSULE | Freq: Every day | RESPIRATORY_TRACT | 0 refills | Status: DC

## 2016-08-24 NOTE — ED Triage Notes (Signed)
Pt reports productive cough with green sputum x 1 week with congestion. Pt denies fevers. Pt has copd, able to speak in complete sentences. spo2 100% at front desk.

## 2016-08-24 NOTE — ED Notes (Signed)
\  [id number]\

## 2016-08-24 NOTE — ED Provider Notes (Signed)
[facility]EM[facility]Y DEPT Provider Note   CSN: [id number] Arrival date & time: 08/24/16  0140  By signing my name below, I, Alyssa Grove, attest that this documentation has been prepared under the direction and in the presence of Zadie Rhine, MD. Electronically Signed: Alyssa Grove, ED Scribe. 08/24/16. 3:25 AM.   History   Chief Complaint Chief Complaint  Patient presents with  . Cough   HPI Comments: David Rhodes is a 61 y.o. Male with PMHx of Allergies, GERD, COPD, Asthma, and HTN who presents to the Emergency Department complaining of gradual onset and worsening, intermittent, productive cough for a few weeks. Pt notes he is producing green sputum. He has experienced similar symptoms before and states symptoms have been relieved with Z-Pak. No treatments tried. He reports associated voice change, shortness of breath, chest pain, abdominal pain, difficulty swallowing. Pt states abdominal pain and chest pain are brought on and exacerbated by cough. He denies smoking. He denies fever or blood-tinged sputum.    The history is provided by the patient. No language interpreter was used.  Cough  This is a new problem. The current episode started more than 1 week ago. The problem occurs constantly. The problem has not changed since onset.The cough is productive of sputum (green). There has been no fever. Associated symptoms include chest pain and shortness of breath. He has tried nothing for the symptoms. He is not a smoker. His past medical history is significant for COPD and asthma.   Pt with h/o tremor and has neurostimulator in place Past Medical History:  Diagnosis Date  . Allergy    Hay fever  . Anxiety   . Asthma   . Chronic pain due to trauma   . COPD (chronic obstructive pulmonary disease) (HCC)   . Depression   . GERD (gastroesophageal reflux disease)   . Headache(784.0)   . Hypertension   . Inguinal hernia    bilateral  . Neuromuscular disorder (HCC)    hx of tremors  .  Pneumonia    many years ago  . Shortness of breath    when allergies flare up  . Urine incontinence   . Wears glasses     Patient Active Problem List   Diagnosis Date Noted  . Pulmonary nodules 12/31/2015  . COPD exacerbation (HCC) 12/27/2015  . Chronic cough 12/27/2015  . Reactive airway disease with acute exacerbation 09/24/2015  . Allergic rhinitis 11/11/2014  . Bursitis of shoulder 11/11/2014  . Nerve root pain 11/11/2014  . Dyskinesia 11/11/2014  . Acid reflux 11/11/2014  . Calcium blood increased 11/11/2014  . Affective disorder (HCC) 11/11/2014  . Has a tremor 11/11/2014  . Screening for colon cancer 12/25/2011  . Hypertension 12/25/2011  . Chronic back pain 12/25/2011    Past Surgical History:  Procedure Laterality Date  . DEEP BRAIN STIMULATOR PLACEMENT    . INGUINAL HERNIA REPAIR Bilateral 02/07/2014   Procedure:  BILATERAL INGUINAL HERNIA REPAIR;  Surgeon: Axel Filler, MD;  Location: [facility] OR;  Service: General;  Laterality: Bilateral;  . INSERTION OF MESH Bilateral 02/07/2014   Procedure: INSERTION OF MESH;  Surgeon: Axel Filler, MD;  Location: [facility] OR;  Service: General;  Laterality: Bilateral;  . NASAL SINUS SURGERY    . SUBTHALAMIC STIMULATOR BATTERY REPLACEMENT  12/09/2011   Procedure: SUBTHALAMIC STIMULATOR BATTERY REPLACEMENT;  Surgeon: Maeola Harman, MD;  Location: [facility];  Service: Neurosurgery;  Laterality: N/A;   deep brain stimulator, implantable pulse generator change  . SUBTHALAMIC STIMULATOR BATTERY REPLACEMENT  Left 04/21/2014   Procedure: Deep brain stimulator battery change;  Surgeon: Maeola Harman, MD;  Location: [facility];  Service: Neurosurgery;  Laterality: Left;  Deep brain stimulator battery change       Home Medications    Prior to Admission medications   Medication Sig Start Date End Date Taking? Authorizing Provider  albuterol (PROVENTIL HFA;VENTOLIN HFA) 108 (90 Base) MCG/ACT inhaler Inhale 2 puffs into the lungs 4 (four)  times daily as needed. 09/24/15   Reubin Milan, MD  albuterol (PROVENTIL HFA;VENTOLIN HFA) 108 (90 Base) MCG/ACT inhaler Inhale 2 puffs into the lungs every 6 (six) hours as needed for wheezing or shortness of breath. 06/23/16   Willy Eddy, MD  amLODipine (NORVASC) 10 MG tablet Take 10 mg by mouth daily.    Historical Provider, MD  BREO ELLIPTA 100-25 MCG/INH AEPB USE 1 INHALATION BY MOUTH DAILY 03/02/16   Reubin Milan, MD  fexofenadine (ALLEGRA) 180 MG tablet Take 180 mg by mouth daily.    Historical Provider, MD  fluticasone (FLOVENT HFA) 110 MCG/ACT inhaler Inhale 2 puffs into the lungs 2 times daily at 12 noon and 4 pm. 05/17/15   Graylon Good, PA-C  gabapentin (NEURONTIN) 600 MG tablet TAKE 1 TABLET BY MOUTH FOUR TIMES DAILY 06/02/16   Reubin Milan, MD  levocetirizine (XYZAL) 5 MG tablet TK 1 T PO QD 02/03/15   Historical Provider, MD  levofloxacin (LEVAQUIN) 500 MG tablet Take 1 tablet (500 mg total) by mouth daily. 03/29/16   Duanne Limerick, MD  lisinopril (PRINIVIL,ZESTRIL) 40 MG tablet Take 40 mg by mouth daily.    Historical Provider, MD  montelukast (SINGULAIR) 10 MG tablet Take 10 mg by mouth at bedtime.    Historical Provider, MD  Omeprazole 20 MG TBEC Take 1 tablet by mouth daily.    Historical Provider, MD  predniSONE (DELTASONE) 20 MG tablet Take 2 tablets (40 mg total) by mouth daily with breakfast. 04/30/16   Erin Fulling, MD  rOPINIRole (REQUIP) 2 MG tablet TAKE 1 TABLET(2 MG) BY MOUTH TWICE DAILY 02/09/16   Reubin Milan, MD  tiotropium (SPIRIVA) 18 MCG inhalation capsule Place 1 capsule into inhaler and inhale daily. 10/24/14 10/24/15  Historical Provider, MD    Family History Family History  Problem Relation Age of Onset  . Myoclonus Mother   . Breast cancer Mother   . Prostate cancer Father   . Heart disease Paternal Grandfather   . Myoclonus Maternal Uncle     Social History Social History  Substance Use Topics  . Smoking status: Never Smoker  .  Smokeless tobacco: Current User    Types: Snuff  . Alcohol use No     Allergies   Penicillins   Review of Systems Review of Systems  Constitutional: Negative for fever.  HENT: Positive for trouble swallowing and voice change.   Respiratory: Positive for cough and shortness of breath.   Cardiovascular: Positive for chest pain.  Gastrointestinal: Positive for abdominal distention.  All other systems reviewed and are negative.   Physical Exam Updated Vital Signs BP (!) 146/102 (BP Location: Right Arm)   Pulse 100   Temp 98.6 F (37 C) (Oral)   Resp 20   Ht 6' (1.829 m)   Wt 155 lb (70.3 kg)   SpO2 100%   BMI 21.02 kg/m   Physical Exam CONSTITUTIONAL: Well developed/well nourished HEAD: Normocephalic/atraumatic EYES: EOMI/PERRL ENMT: Mucous membranes moist, uvula midline, no exudates NECK: supple no meningeal signs  SPINE/BACK:entire spine nontender CV: S1/S2 noted, no murmurs/rubs/gallops noted LUNGS: Lungs are clear to auscultation bilaterally, no apparent distress ABDOMEN: soft, nontender, no rebound or guarding, bowel sounds noted throughout abdomen GU:no cva tenderness NEURO: Pt is awake/alert/appropriate, moves all extremitiesx4.  No facial droop.  Tremor noted EXTREMITIES: pulses normal/equal, full ROM, No edema SKIN: warm, color normal, neurostimulator in left upper chest PSYCH: no abnormalities of mood noted, alert and oriented to situation   ED Treatments / Results  DIAGNOSTIC STUDIES: Oxygen Saturation is 100% on RA, normal by my interpretation.    COORDINATION OF CARE: 3:19 AM Discussed treatment plan with pt at bedside which includes breathing treatment and steroid and pt agreed to plan.  Labs (all labs ordered are listed, but only abnormal results are displayed) Labs Reviewed - No data to display  EKG  EKG Interpretation  Date/Time:  Sunday August 24 2016 01:56:28 EST Ventricular Rate:  104 PR Interval:  154 QRS Duration: 76 QT  Interval:  326 QTC Calculation: 428 R Axis:   66 Text Interpretation:  Sinus tachycardia Otherwise normal ECG Confirmed by Bebe Shaggy  MD, Chelsye Suhre ([id number]) on 08/24/2016 3:17:47 AM       Radiology Dg Chest 2 View  Result Date: 08/24/2016 CLINICAL DATA:  Productive cough EXAM: CHEST  2 VIEW COMPARISON:  06/23/2016, 07/02/2015 FINDINGS: Hyperinflation. Mild left lower lung streaky infiltrate. No pleural effusion. Stable left-sided generator with ascending wires. Stable cardiomediastinal silhouette. Osteopenia. No pneumothorax. IMPRESSION: Streaky infiltrate in the left lower lung. Electronically Signed   By: Jasmine Pang M.D.   On: 08/24/2016 03:25    Procedures Procedures (including critical care time)  Medications Ordered in ED Medications  doxycycline (VIBRA-TABS) tablet 100 mg (not administered)  albuterol (PROVENTIL) (2.5 MG/3ML) 0.083% nebulizer solution 5 mg (5 mg Nebulization Given 08/24/16 0156)  dexamethasone (DECADRON) tablet 8 mg (8 mg Oral Given 08/24/16 0346)  albuterol (PROVENTIL) (2.5 MG/3ML) 0.083% nebulizer solution 5 mg (5 mg Nebulization Given 08/24/16 0346)     Initial Impression / Assessment and Plan / ED Course  I have reviewed the triage vital signs and the nursing notes.  Pertinent labs  imaging results that were available during my care of the patient were reviewed by me and considered in my medical decision making (see chart for details).    Pt stable Well appearing He appears improved CXR shows small area of infiltrate He is clinically well for d/c home He requests refill of all COPD meds Will start doxycycline We discussed strict ER return precautions   I personally performed the services described in this documentation, which was scribed in my presence. The recorded information has been reviewed and is accurate.       Final Clinical Impressions(s) / ED Diagnoses   Final diagnoses:  Community acquired pneumonia of left lower lobe of lung (HCC)     New Prescriptions New Prescriptions   ALBUTEROL (PROVENTIL HFA;VENTOLIN HFA) 108 (90 BASE) MCG/ACT INHALER    Inhale 1-2 puffs into the lungs every 6 (six) hours as needed for wheezing or shortness of breath.   DOXYCYCLINE (VIBRAMYCIN) 100 MG CAPSULE    Take 1 capsule (100 mg total) by mouth 2 (two) times daily. One po bid x 7 days   FLUTICASONE FUROATE-VILANTEROL (BREO ELLIPTA) 100-25 MCG/INH AEPB    Inhale 1 puff into the lungs daily.   TIOTROPIUM (SPIRIVA HANDIHALER) 18 MCG INHALATION CAPSULE    Place 1 capsule (18 mcg total) into inhaler and inhale daily.     Dorinda Hill  Bebe ShaggyWickline, MD 08/24/16 [id number]

## 2016-08-29 ENCOUNTER — Other Ambulatory Visit: Payer: Self-pay | Admitting: Neurosurgery

## 2016-09-01 ENCOUNTER — Other Ambulatory Visit: Payer: Self-pay | Admitting: Internal Medicine

## 2016-09-01 MED ORDER — ROPINIROLE HCL 2 MG PO TABS: 2.0000 mg | ORAL_TABLET | Freq: Two times a day (BID) | ORAL | 1 refills | Status: DC

## 2016-09-01 MED ORDER — GABAPENTIN 600 MG PO TABS: 600.0000 mg | ORAL_TABLET | Freq: Four times a day (QID) | ORAL | 1 refills | Status: DC

## 2016-09-02 ENCOUNTER — Ambulatory Visit: Payer: Medicare Other | Admitting: Internal Medicine

## 2016-09-08 ENCOUNTER — Other Ambulatory Visit: Payer: Self-pay | Admitting: Internal Medicine

## 2016-09-08 ENCOUNTER — Ambulatory Visit: Payer: Medicare Other | Admitting: Internal Medicine

## 2016-09-08 MED ORDER — MONTELUKAST SODIUM 10 MG PO TABS: 10.0000 mg | ORAL_TABLET | Freq: Every day | ORAL | 5 refills | Status: DC

## 2016-09-08 MED ORDER — TIOTROPIUM BROMIDE MONOHYDRATE 18 MCG IN CAPS: 18.0000 ug | ORAL_CAPSULE | Freq: Every day | RESPIRATORY_TRACT | 5 refills | Status: DC

## 2016-09-08 MED ORDER — AMLODIPINE BESYLATE 10 MG PO TABS
10.0000 mg | ORAL_TABLET | Freq: Every day | ORAL | 5 refills | Status: DC
Start: 2016-09-08 — End: 2017-05-19

## 2016-09-08 MED ORDER — LISINOPRIL 40 MG PO TABS: 40.0000 mg | ORAL_TABLET | Freq: Every day | ORAL | 5 refills | Status: DC

## 2016-09-08 MED ORDER — FLUTICASONE FUROATE-VILANTEROL 100-25 MCG/INH IN AEPB: 1.0000 | INHALATION_SPRAY | Freq: Every day | RESPIRATORY_TRACT | 0 refills | Status: DC

## 2016-09-08 MED ORDER — ALBUTEROL SULFATE HFA 108 (90 BASE) MCG/ACT IN AERS: 1.0000 | INHALATION_SPRAY | Freq: Four times a day (QID) | RESPIRATORY_TRACT | 0 refills | Status: DC | PRN

## 2016-09-08 NOTE — Pre-Procedure Instructions (Signed)
David BienenstockRonald E Rhodes  09/08/2016      [facility] [place] - [address] [facility] [address] Phone: [phone] Fax: [phone]  [facility] - [place], [place] - [address] AT [facility] [address] [facility] [address] Phone: [phone] Fax: [phone]    Your procedure is scheduled on March 15  [facility] at 1200 P.M.  Call this number if you have problems the morning of surgery:  [phone]   Remember:  Do not eat food or drink liquids after midnight.   Take these medicines the morning of surgery with A SIP OF WATER albuterol (PROVENTIL HFA;VENTOLIN HFA), amLODipine (NORVASC), fexofenadine (ALLEGRA, fluticasone furoate-vilanterol (BREO ELLIPTA), gabapentin (NEURONTIN), levocetirizine (XYZAL), Omeprazole, rOPINIRole (REQUIP), tiotropium (SPIRIVA HANDIHALER)  7 days prior to surgery STOP taking any Aspirin, Aleve, Naproxen, Ibuprofen, Motrin, Advil, Goody's, BC's, all herbal medications, fish oil, and all vitamins     Do not wear jewelry.  Do not wear lotions, powders, or cologne, or deoderant.   Men may shave face and neck.  Do not bring valuables to the hospital.  [facility] is not responsible for any belongings or valuables.  Contacts, dentures or bridgework may not be worn into surgery.  Leave your suitcase in the car.  After surgery it may be brought to your room.  For patients admitted to the hospital, discharge time will be determined by your treatment team.  Patients discharged the day of surgery will not be allowed to drive home.    Special instructions:   [facility]- Preparing For Surgery  Before surgery, you can play an important role. Because skin is not sterile, your skin needs to be as free of germs as possible. You can reduce the number of germs on your skin by washing with CHG (chlorahexidine gluconate) Soap before surgery.  CHG is an  antiseptic cleaner which kills germs and bonds with the skin to continue killing germs even after washing.  Please do not use if you have an allergy to CHG or antibacterial soaps. If your skin becomes reddened/irritated stop using the CHG.  Do not shave (including legs and underarms) for at least 48 hours prior to first CHG shower. It is OK to shave your face.  Please follow these instructions carefully.   1. Shower the NIGHT BEFORE SURGERY and the MORNING OF SURGERY with CHG.   2. If you chose to wash your hair, wash your hair first as usual with your normal shampoo.  3. After you shampoo, rinse your hair and body thoroughly to remove the shampoo.  4. Use CHG as you would any other liquid soap. You can apply CHG directly to the skin and wash gently with a scrungie or a clean washcloth.   5. Apply the CHG Soap to your body ONLY FROM THE NECK DOWN.  Do not use on open wounds or open sores. Avoid contact with your eyes, ears, mouth and genitals (private parts). Wash genitals (private parts) with your normal soap.  6. Wash thoroughly, paying special attention to the area where your surgery will be performed.  7. Thoroughly rinse your body with warm water from the neck down.  8. DO NOT shower/wash with your normal soap after using and rinsing off the CHG Soap.  9. Pat yourself dry with a CLEAN TOWEL.   10. Wear CLEAN PAJAMAS   11. Place CLEAN  SHEETS on your bed the night of your first shower and DO NOT SLEEP WITH PETS.    Day of Surgery: Do not apply any deodorants/lotions. Please wear clean clothes to the hospital/surgery center.      Please read over the following fact sheets that you were given.

## 2016-09-09 ENCOUNTER — Inpatient Hospital Stay (HOSPITAL_COMMUNITY)
Admission: RE | Admit: 2016-09-09 | Discharge: 2016-09-09 | Disposition: A | Discharge status: Home / Self Care | Payer: Medicare Other | Source: Ambulatory Visit

## 2016-09-10 ENCOUNTER — Encounter (HOSPITAL_COMMUNITY): Payer: Self-pay | Admitting: *Deleted

## 2016-09-10 MED ORDER — VANCOMYCIN HCL IN DEXTROSE 1-5 GM/200ML-% IV SOLN
1000.0000 mg | INTRAVENOUS | Status: AC
  Administered 2016-09-11: 1000 mg via INTRAVENOUS
  Filled 2016-09-10: qty 200

## 2016-09-10 NOTE — Progress Notes (Signed)
Pt was made aware to stop taking taking Aspirin, vitamins, fish oil and herbal medications. Do not take any NSAIDs ie: Ibuprofen, Advil, Naproxen, BC and Goody Powder or any medication containing Aspirin. Anesthesia asked to review pt recent chest x ray. Pt denies having a follow up chest x ray.

## 2016-09-10 NOTE — Progress Notes (Signed)
Pt denies SOB, chest pain, and being under the care of a cardiologist. Pt is a poor historian. Pt stated that he is not sure if he had an echo or stress test ( after being given a description of the following). Pt denies having a cardiac cath. Pt stated that he is not sure when he completed his regimen of antibiotics for recent treatment of PNA but stated " I feel better than before." Pt stated that his "phlem is white/beige" and he coughs about every 3-4 hours. Pt seemed irritated with the many questions that were asked during the assessment. Pt went to his car to retrieve pre-op instructions that he received from the MD. Arrival times did not match ; I explained to the patient that because he missed his Pre- Admission Testing appointment that was scheduled for 3./13/18 at 8:15 A.M. ,  we ask that he arrives a half- hour earlier. Pt seemed overwhelmed with reviewing medications, he expressed disappointment because the pharmacist would never explain to him what the medications were prescribed for; I explained what each medication was used for. I reiterated that he must not take Lisinopril the morning of surgery. Pt was given the address of [employer] for [employer] on his instructions from the surgeon. Pt was made aware of the correct address of [address], when giving the patient the number to short stay, we were disconnected. A voice message was left with the phone number to [facility] and a follow up call was made to confirm receipt of message. The patient said "okay" and abruptly hung up.

## 2016-09-10 NOTE — Progress Notes (Signed)
Anesthesia Chart Review:  Pt is a same day work up as he did not show up for PAT appointment.   Pt is a 61 year old male scheduled for L chest- change implantable pulse generator battery (end of battery life for deep brain stimulator) on 09/11/2016 with Maeola HarmanJoseph Stern, MD  PMH includes:  HTN, COPD, tremors, GERD. Never smoker. BMI 21. S/p inguinal hernia repair 02/07/14.   - Pt dx with pneumonia in ER 08/24/16, rx doxycycline. PAT RN spoke to pt on phone.  He reports finishing doxycycline, and feels much better, but is still has a frequent, productive cough.   Medications include: albuterol, amlodipine, breo ellipta, lisinopril, prilosec, spiriva  Labs will be obtained DOS.   CXR 08/24/16: Streaky infiltrate in the left lower lung.  EKG 08/24/16: Sinus tachycardia (104 bpm).   Echo 10/30/14:  1. Normal LV systolic function. 2. Normal RV systolic function. 3. Mild valvular regurgitation. Trivial MR. 4. No valvular stenosis.  Reviewed case with Dr. Desmond Lopeurk.  Pt will need further assessment by assigned anesthesiologist DOS.   Rica Mastngela Wael Maestas, FNP-BC [facility]Anesthesiology Phone: [phone] 09/10/2016 4:38 PM

## 2016-09-11 ENCOUNTER — Ambulatory Visit (HOSPITAL_COMMUNITY): Payer: Medicare Other | Admitting: Certified Registered Nurse Anesthetist

## 2016-09-11 ENCOUNTER — Ambulatory Visit (HOSPITAL_COMMUNITY)
Admission: RE | Disposition: A | Discharge status: Home / Self Care | Payer: Self-pay | Source: Ambulatory Visit | Attending: Neurosurgery

## 2016-09-11 ENCOUNTER — Encounter (HOSPITAL_COMMUNITY): Payer: Self-pay | Admitting: *Deleted

## 2016-09-11 ENCOUNTER — Observation Stay (HOSPITAL_COMMUNITY)
Admission: RE | Admit: 2016-09-11 | Discharge: 2016-09-12 | Disposition: A | Discharge status: Home / Self Care | Payer: Medicare Other | Source: Ambulatory Visit | Attending: Neurological Surgery | Admitting: Neurological Surgery

## 2016-09-11 DIAGNOSIS — Z5181 Encounter for therapeutic drug level monitoring: Secondary | ICD-10-CM

## 2016-09-11 DIAGNOSIS — Z880 Allergy status to penicillin: Secondary | ICD-10-CM | POA: Insufficient documentation

## 2016-09-11 DIAGNOSIS — I10 Essential (primary) hypertension: Secondary | ICD-10-CM | POA: Insufficient documentation

## 2016-09-11 DIAGNOSIS — Z720 Tobacco use: Secondary | ICD-10-CM | POA: Diagnosis not present

## 2016-09-11 DIAGNOSIS — Z79899 Other long term (current) drug therapy: Secondary | ICD-10-CM | POA: Insufficient documentation

## 2016-09-11 DIAGNOSIS — J449 Chronic obstructive pulmonary disease, unspecified: Secondary | ICD-10-CM | POA: Diagnosis not present

## 2016-09-11 DIAGNOSIS — G250 Essential tremor: Secondary | ICD-10-CM | POA: Insufficient documentation

## 2016-09-11 DIAGNOSIS — Z4542 Encounter for adjustment and management of neuropacemaker (brain) (peripheral nerve) (spinal cord): Secondary | ICD-10-CM | POA: Diagnosis present

## 2016-09-11 HISTORY — PX: PULSE GENERATOR IMPLANT: SHX5370

## 2016-09-11 LAB — CBC
HEMATOCRIT: 45.7 % (ref 39.0–52.0)
HEMOGLOBIN: 14.9 g/dL (ref 13.0–17.0)
MCH: 29.2 pg (ref 26.0–34.0)
MCHC: 32.6 g/dL (ref 30.0–36.0)
MCV: 89.4 fL (ref 78.0–100.0)
Platelets: 199 10*3/uL (ref 150–400)
RBC: 5.11 MIL/uL (ref 4.22–5.81)
RDW: 13.4 % (ref 11.5–15.5)
WBC: 7.5 10*3/uL (ref 4.0–10.5)

## 2016-09-11 LAB — BASIC METABOLIC PANEL
ANION GAP: 7 (ref 5–15)
BUN: 6 mg/dL (ref 6–20)
CO2: 29 mmol/L (ref 22–32)
Calcium: 10.3 mg/dL (ref 8.9–10.3)
Chloride: 104 mmol/L (ref 101–111)
Creatinine, Ser: 0.87 mg/dL (ref 0.61–1.24)
GFR calc Af Amer: >60 mL/min (ref >60)
Glucose, Bld: 69 mg/dL (ref 65–99)
POTASSIUM: 3.7 mmol/L (ref 3.5–5.1)
SODIUM: 140 mmol/L (ref 135–145)

## 2016-09-11 SURGERY — UNILATERAL PULSE GENERATOR IMPLANT
Anesthesia: General | Site: Chest | Laterality: Left

## 2016-09-11 MED ORDER — 0.9 % SODIUM CHLORIDE (POUR BTL) OPTIME
TOPICAL | Status: DC | PRN
  Administered 2016-09-11: 1000 mL

## 2016-09-11 MED ORDER — PROPOFOL 500 MG/50ML IV EMUL
INTRAVENOUS | Status: DC | PRN
  Administered 2016-09-11: 50 ug/kg/min via INTRAVENOUS

## 2016-09-11 MED ORDER — CHLORHEXIDINE GLUCONATE CLOTH 2 % EX PADS: 6.0000 | MEDICATED_PAD | Freq: Once | CUTANEOUS | Status: DC

## 2016-09-11 MED ORDER — FENTANYL CITRATE (PF) 100 MCG/2ML IJ SOLN
INTRAMUSCULAR | Status: AC
  Filled 2016-09-11: qty 2

## 2016-09-11 MED ORDER — BUPIVACAINE HCL (PF) 0.5 % IJ SOLN
INTRAMUSCULAR | Status: AC
  Filled 2016-09-11: qty 30

## 2016-09-11 MED ORDER — PROPOFOL 10 MG/ML IV BOLUS
INTRAVENOUS | Status: AC
  Filled 2016-09-11: qty 20

## 2016-09-11 MED ORDER — FENTANYL CITRATE (PF) 100 MCG/2ML IJ SOLN: 25.0000 ug | INTRAMUSCULAR | Status: DC | PRN

## 2016-09-11 MED ORDER — PROPOFOL 1000 MG/100ML IV EMUL
INTRAVENOUS | Status: AC
  Filled 2016-09-11: qty 200

## 2016-09-11 MED ORDER — FENTANYL CITRATE (PF) 100 MCG/2ML IJ SOLN
INTRAMUSCULAR | Status: DC | PRN
  Administered 2016-09-11: 100 ug via INTRAVENOUS

## 2016-09-11 MED ORDER — MIDAZOLAM HCL 2 MG/2ML IJ SOLN
INTRAMUSCULAR | Status: DC | PRN
  Administered 2016-09-11: 2 mg via INTRAVENOUS

## 2016-09-11 MED ORDER — VANCOMYCIN HCL POWD
Status: DC | PRN
  Administered 2016-09-11: 1000 mg

## 2016-09-11 MED ORDER — MIDAZOLAM HCL 2 MG/2ML IJ SOLN
INTRAMUSCULAR | Status: AC
  Filled 2016-09-11: qty 2

## 2016-09-11 MED ORDER — LIDOCAINE-EPINEPHRINE (PF) 2 %-1:200000 IJ SOLN
INTRAMUSCULAR | Status: AC
  Filled 2016-09-11: qty 20

## 2016-09-11 MED ORDER — LIDOCAINE-EPINEPHRINE 1 %-1:100000 IJ SOLN
INTRAMUSCULAR | Status: DC | PRN
  Administered 2016-09-11: 6 mL

## 2016-09-11 MED ORDER — BUPIVACAINE HCL (PF) 0.5 % IJ SOLN
INTRAMUSCULAR | Status: DC | PRN
  Administered 2016-09-11: 6 mL

## 2016-09-11 MED ORDER — VANCOMYCIN HCL 1000 MG IV SOLR
INTRAVENOUS | Status: AC
  Filled 2016-09-11: qty 1000

## 2016-09-11 MED ORDER — PROMETHAZINE HCL 25 MG/ML IJ SOLN: 6.2500 mg | INTRAMUSCULAR | Status: DC | PRN

## 2016-09-11 MED ORDER — LACTATED RINGERS IV SOLN
INTRAVENOUS | Status: DC
  Administered 2016-09-11 (×3): via INTRAVENOUS

## 2016-09-11 SURGICAL SUPPLY — 46 items
ADH SKN CLS APL DERMABOND .7 (GAUZE/BANDAGES/DRESSINGS) ×1
BANDAGE ADH SHEER 1  50/CT (GAUZE/BANDAGES/DRESSINGS) IMPLANT
BLADE CLIPPER SURG (BLADE) IMPLANT
BLADE SURG 11 STRL SS (BLADE) ×6 IMPLANT
BOOT SUTURE AID YELLOW STND (SUTURE) IMPLANT
CANISTER SUCT 3000ML PPV (MISCELLANEOUS) ×3 IMPLANT
CLIP RANEY DISP (INSTRUMENTS) IMPLANT
COVER BACK TABLE 60X90IN (DRAPES) ×3 IMPLANT
DECANTER SPIKE VIAL GLASS SM (MISCELLANEOUS) ×3 IMPLANT
DERMABOND ADVANCED (GAUZE/BANDAGES/DRESSINGS) ×2
DERMABOND ADVANCED .7 DNX12 (GAUZE/BANDAGES/DRESSINGS) IMPLANT
DRAPE C-ARM 42X72 X-RAY (DRAPES) IMPLANT
DRAPE CAMERA CLOSED 9X96 (DRAPES) ×3 IMPLANT
DRAPE CAMERA VIDEO/LASER (DRAPES) ×2 IMPLANT
DRAPE ORTHO SPLIT 77X108 STRL (DRAPES) ×6
DRAPE POUCH INSTRU U-SHP 10X18 (DRAPES) ×2 IMPLANT
DRAPE SURG ORHT 6 SPLT 77X108 (DRAPES) IMPLANT
DRSG OPSITE POSTOP 4X6 (GAUZE/BANDAGES/DRESSINGS) ×2 IMPLANT
DRSG TELFA 3X8 NADH (GAUZE/BANDAGES/DRESSINGS) ×3 IMPLANT
DURAPREP 26ML APPLICATOR (WOUND CARE) ×6 IMPLANT
GAUZE SPONGE 4X4 12PLY STRL (GAUZE/BANDAGES/DRESSINGS) IMPLANT
GAUZE SPONGE 4X4 16PLY XRAY LF (GAUZE/BANDAGES/DRESSINGS) IMPLANT
GLOVE BIO SURGEON STRL SZ8 (GLOVE) ×3 IMPLANT
GLOVE BIOGEL PI IND STRL 8 (GLOVE) IMPLANT
GLOVE BIOGEL PI INDICATOR 8 (GLOVE) ×2
GLOVE ECLIPSE 8.0 STRL XLNG CF (GLOVE) ×3 IMPLANT
GLOVE INDICATOR 8.0 STRL GRN (GLOVE) ×3 IMPLANT
GLOVE INDICATOR 8.5 STRL (GLOVE) ×3 IMPLANT
GOWN STRL REUS W/TWL 2XL LVL3 (GOWN DISPOSABLE) ×4 IMPLANT
KIT BASIN OR (CUSTOM PROCEDURE TRAY) ×3 IMPLANT
KIT REMOVER STAPLE SKIN (MISCELLANEOUS) ×3 IMPLANT
MARKER SKIN DUAL TIP RULER LAB (MISCELLANEOUS) ×3 IMPLANT
NDL HYPO 25X1 1.5 SAFETY (NEEDLE) ×1 IMPLANT
NDL SPNL 18GX3.5 QUINCKE PK (NEEDLE) IMPLANT
NEEDLE HYPO 25X1 1.5 SAFETY (NEEDLE) ×3 IMPLANT
NEEDLE SPNL 18GX3.5 QUINCKE PK (NEEDLE) IMPLANT
NEEDLE SPNL 22GX3.5 QUINCKE BK (NEEDLE) IMPLANT
NEUROSTIM OCTOPOLAR ~~LOC~~ 49X65 (Neuro Prosthesis/Implant) ×2 IMPLANT
NEUROSTIM PROGRAMMER 2.2X3.7 (NEUROSURGERY SUPPLIES) ×3 IMPLANT
PACK LAMINECTOMY NEURO (CUSTOM PROCEDURE TRAY) ×3 IMPLANT
PAD ARMBOARD 7.5X6 YLW CONV (MISCELLANEOUS) ×6 IMPLANT
PAD DRESSING TELFA 3X8 NADH (GAUZE/BANDAGES/DRESSINGS) ×1 IMPLANT
STAPLER SKIN PROX WIDE 3.9 (STAPLE) ×3 IMPLANT
SUT ETHILON 3 0 PS 1 (SUTURE) IMPLANT
SUT VIC AB 2-0 CP2 18 (SUTURE) ×3 IMPLANT
SUT VIC AB 3-0 SH 8-18 (SUTURE) ×4 IMPLANT

## 2016-09-11 NOTE — OR Nursing (Signed)
Mr. David Rhodes has contacted 4 different people to come pick him up.   Awaiting a response.

## 2016-09-11 NOTE — Anesthesia Preprocedure Evaluation (Signed)
Anesthesia Evaluation  Patient identified by MRN, date of birth, ID band Patient awake    Reviewed: Allergy & Precautions, NPO status , Patient's Chart, lab work & pertinent test results  Airway Mallampati: II  TM Distance: >3 FB Neck ROM: Full    Dental no notable dental hx.    Pulmonary asthma , COPD,    Pulmonary exam normal breath sounds clear to auscultation       Cardiovascular hypertension, Normal cardiovascular exam Rhythm:Regular Rate:Normal     Neuro/Psych negative neurological ROS  negative psych ROS   GI/Hepatic negative GI ROS, Neg liver ROS,   Endo/Other  negative endocrine ROS  Renal/GU negative Renal ROS  negative genitourinary   Musculoskeletal negative musculoskeletal ROS (+)   Abdominal   Peds negative pediatric ROS (+)  Hematology negative hematology ROS (+)   Anesthesia Other Findings   Reproductive/Obstetrics negative OB ROS                             Anesthesia Physical Anesthesia Plan  ASA: III  Anesthesia Plan: General   Post-op Pain Management:    Induction: Intravenous  Airway Management Planned: Oral ETT  Additional Equipment:   Intra-op Plan:   Post-operative Plan: Extubation in OR  Informed Consent: I have reviewed the patients History and Physical, chart, labs and discussed the procedure including the risks, benefits and alternatives for the proposed anesthesia with the patient or authorized representative who has indicated his/her understanding and acceptance.   Dental advisory given  Plan Discussed with: CRNA and Surgeon  Anesthesia Plan Comments:         Anesthesia Quick Evaluation

## 2016-09-11 NOTE — Interval H&P Note (Signed)
History and Physical Interval Note:  09/11/2016 12:22 PM  David BienenstockRonald E Rhodes  has presented today for surgery, with the diagnosis of End of battery life for deep brain stimulator  The various methods of treatment have been discussed with the patient and family. After consideration of risks, benefits and other options for treatment, the patient has consented to  Procedure(s) with comments: Left chest-Change implantable pulse generator battery (Left) - Left chest-Change implantable pulse generator battery as a surgical intervention .  The patient's history has been reviewed, patient examined, no change in status, stable for surgery.  I have reviewed the patient's chart and labs.  Questions were answered to the patient's satisfaction.     Jencarlos Nicolson D

## 2016-09-11 NOTE — Op Note (Signed)
09/11/2016  5:07 PM  PATIENT:  David Rhodes  61 y.o. male  PRE-OPERATIVE DIAGNOSIS:  End of battery life for deep brain stimulator  POST-OPERATIVE DIAGNOSIS:  End of battery life for deep brain stimulator  PROCEDURE:  Procedure(s) with comments: Left chest-Change implantable pulse generator battery (Left) - Left chest-Change implantable pulse generator battery  SURGEON:  Surgeon(s) and Role:    * Erline Levine, MD - Primary  PHYSICIAN ASSISTANT:   ASSISTANTS: P[place]at, RN   ANESTHESIA:   MAC  EBL:  Total I/O In: 1000 [I.V.:1000] Out: -   BLOOD ADMINISTERED:none  DRAINS: none   LOCAL MEDICATIONS USED:  MARCAINE    and LIDOCAINE   SPECIMEN:  No Specimen  DISPOSITION OF SPECIMEN:  N/A  COUNTS:  YES  TOURNIQUET:  * No tourniquets in log *  DICTATION: DICTATION: Patient has implanted VIM thalamic stimulator electrodes and IPG, which is now depleted.  It was elected for patient to undergo IPG revision.  PROCEDURE: Patient was brought to the operating room and given intravenous sedation.  Left upper chest was prepped with betadine scrub and Duraprep.  Area of planned incision was infiltrated with lidocaine.  Prior incision was reopened and the old IPG was externalized.  Adaptor was redconnected to new IPG which was placed in the pocket.  Wound was irrigated with  vancomycin. Then irrigated once more.  Incision was closed with 2-0 Vicryl and 3-0 vicryl sutures and dressed with a sterile occlusive dressing.  Counts were correct at the end of the case.  PLAN OF CARE: Discharge to home after PACU  PATIENT DISPOSITION:  PACU - hemodynamically stable.   Delay start of Pharmacological VTE agent (>24hrs) due to surgical blood loss or risk of bleeding: yes

## 2016-09-11 NOTE — OR Nursing (Signed)
Approximately 2030, Mr. David Rhodes approached me and told me he was leaving.  He walked out the main entrance and to the parking garage.  Security was informed of Mr. David Rhodes's attempt to leave and drive himself home.  Security and myself convinced Mr. David Rhodes to reenter the hospital to wait until someone could come get him.

## 2016-09-11 NOTE — OR Nursing (Signed)
At 1800, Mr. David Rhodes ambulated around the department without any issues.  He got dressed and was ready to go home.  When I initiated reviewing his discharge instructions and more specifically the direction that he is not to drive for 24 hours, he laughed.  I asked him if he had someone to come and get him.  After a delay, he said yes, "But I will have to call them from my cell phone from outside.  They will not answer an unknown number and I don't get reception inside."  I reinforced how he was not to be driving and someone needed to take him home. I notified Dr. Desmond Lopeurk regarding Mr. David Rhodes's situation.  He came to the bedside and reinforced Mr. David Rhodes's need to have someone else drive him home.  I told Dr. Desmond Lopeurk I would escort him outside and stay with him until his responsible person arrived to drive him home.  Dr. Desmond Lopeurk specifically stated that because Mr. David Rhodes received Versed and Fentanyl that he was not able to drive himself home.  I escorted Mr. David Rhodes to the main entrance and he called someone to come and get him.

## 2016-09-11 NOTE — OR Nursing (Signed)
After multiple phone calls and stories of whom may come to get him, Mr. David Rhodes is still waiting for a ride home.  I discussed the situation with Dr. Yetta BarreJones.

## 2016-09-11 NOTE — Brief Op Note (Signed)
09/11/2016  5:07 PM  PATIENT:  David Rhodes  60 y.o. male  PRE-OPERATIVE DIAGNOSIS:  End of battery life for deep brain stimulator  POST-OPERATIVE DIAGNOSIS:  End of battery life for deep brain stimulator  PROCEDURE:  Procedure(s) with comments: Left chest-Change implantable pulse generator battery (Left) - Left chest-Change implantable pulse generator battery  SURGEON:  Surgeon(s) and Role:    * Meghann Landing, MD - Primary  PHYSICIAN ASSISTANT:   ASSISTANTS: Poteat, RN   ANESTHESIA:   MAC  EBL:  Total I/O In: 1000 [I.V.:1000] Out: -   BLOOD ADMINISTERED:none  DRAINS: none   LOCAL MEDICATIONS USED:  MARCAINE    and LIDOCAINE   SPECIMEN:  No Specimen  DISPOSITION OF SPECIMEN:  N/A  COUNTS:  YES  TOURNIQUET:  * No tourniquets in log *  DICTATION: DICTATION: Patient has implanted VIM thalamic stimulator electrodes and IPG, which is now depleted.  It was elected for patient to undergo IPG revision.  PROCEDURE: Patient was brought to the operating room and given intravenous sedation.  Left upper chest was prepped with betadine scrub and Duraprep.  Area of planned incision was infiltrated with lidocaine.  Prior incision was reopened and the old IPG was externalized.  Adaptor was redconnected to new IPG which was placed in the pocket.  Wound was irrigated with  vancomycin. Then irrigated once more.  Incision was closed with 2-0 Vicryl and 3-0 vicryl sutures and dressed with a sterile occlusive dressing.  Counts were correct at the end of the case.  PLAN OF CARE: Discharge to home after PACU  PATIENT DISPOSITION:  PACU - hemodynamically stable.   Delay start of Pharmacological VTE agent (>24hrs) due to surgical blood loss or risk of bleeding: yes   

## 2016-09-11 NOTE — Discharge Instructions (Signed)
Monitored Anesthesia Care, Care After °These instructions provide you with information about caring for yourself after your procedure. Your health care provider may also give you more specific instructions. Your treatment has been planned according to current medical practices, but problems sometimes occur. Call your health care provider if you have any problems or questions after your procedure. °What can I expect after the procedure? °After your procedure, it is common to: °· Feel sleepy for several hours. °· Feel clumsy and have poor balance for several hours. °· Feel forgetful about what happened after the procedure. °· Have poor judgment for several hours. °· Feel nauseous or vomit. °· Have a sore throat if you had a breathing tube during the procedure. °Follow these instructions at home: °For at least 24 hours after the procedure:  °· Do not: °¨ Participate in activities in which you could fall or become injured. °¨ Drive. °¨ Use heavy machinery. °¨ Drink alcohol. °¨ Take sleeping pills or medicines that cause drowsiness. °¨ Make important decisions or sign legal documents. °¨ Take care of children on your own. °· Rest. °Eating and drinking °· Follow the diet that is recommended by your health care provider. °· If you vomit, drink water, juice, or soup when you can drink without vomiting. °· Make sure you have little or no nausea before eating solid foods. °General instructions °· Have a responsible adult stay with you until you are awake and alert. °· Take over-the-counter and prescription medicines only as told by your health care provider. °· If you smoke, do not smoke without supervision. °· Keep all follow-up visits as told by your health care provider. This is important. °Contact a health care provider if: °· You keep feeling nauseous or you keep vomiting. °· You feel light-headed. °· You develop a rash. °· You have a fever. °Get help right away if: °· You have trouble breathing. °This information is not  intended to replace advice given to you by your health care provider. Make sure you discuss any questions you have with your health care provider. °Document Released: 10/07/2015 Document Revised: 02/06/2016 Document Reviewed: 10/07/2015 °Elsevier Interactive Patient Education © 2017 [employer] ° °

## 2016-09-11 NOTE — OR Nursing (Signed)
Mr. David Rhodes stated that he did not want to stay the night because he could not afford the bill.

## 2016-09-11 NOTE — Transfer of Care (Signed)
Immediate Anesthesia Transfer of Care Note  Patient: David Rhodes  Procedure(s) Performed: Procedure(s) with comments: Left chest-Change implantable pulse generator battery (Left) - Left chest-Change implantable pulse generator battery  Patient Location: PACU  Anesthesia Type:MAC  Level of Consciousness: awake, alert  and oriented  Airway & Oxygen Therapy: Patient Spontanous Breathing  Post-op Assessment: Report given to RN, Post -op Vital signs reviewed and stable and Patient moving all extremities  Post vital signs: Reviewed and stable  Last Vitals:  Vitals:   09/11/16 1215  BP: 133/88  Pulse: 95  Resp: 20  Temp: 36.8 C    Last Pain:  Vitals:   09/11/16 1215  TempSrc: Oral      Patients Stated Pain Goal: 8 (79/49/97 1820)  Complications: No apparent anesthesia complications

## 2016-09-11 NOTE — H&P (Signed)
  Patient ID:   [id number] Patient: David Rhodes  Date of Birth: [birth year] Visit Type: Office Visit   Date: 08/27/2016 03:15 PM Provider: Danae OrleansJoseph D. Venetia MaxonStern MD   This 61 year old male presents for Tremor.  History of Present Illness: 1.  Tremor    Battery change 04/21/2014 Battery change 12/09/2011 Battery placed 12/10/2006 Deep brain stimulator 12/03/06 for essential tremor  David Rhodes returns to discuss implanted pulse generator change.  He states he saw Dr. tapped in November or December and she noted his battery was near the end of service.      Medical/Surgical/Interim History Reviewed, no change.  Last detailed document date:04/18/2014.   Family History: Reviewed, no changes.  Last detailed document: 04/18/2014.   Social History: Tobacco use reviewed. Reviewed, no changes. Last detailed document date: 04/18/2014.      MEDICATIONS(added, continued or stopped this visit): Started Medication Directions Instruction Stopped   gabapentin 600 mg tablet take 1 tablet by oral route 3 times every day     Requip take 1 tablet by oral route  every bedtime     Singulair take 1 tablet by oral route  every day in the evening       ALLERGIES: Ingredient Reaction Medication Name Comment  PENICILLINS Unknown        Vitals Date Temp F BP Pulse Ht In Wt Lb BMI BSA Pain Score  08/27/2016  128/94 98 72 159 21.56  0/10      IMPRESSION The patient comes into discuss IPG change.  Scheduled for battery replacement.  Completed Orders (this encounter) Order Details Reason Side Interpretation Result Initial Treatment Date Region  Hypertension education Patient to follow up with primary care provider.         Assessment/Plan # Detail Type Description   1. Assessment Essential (primary) hypertension (I10).       2. Assessment End of battery life of deep brain stimulator (Z45.42).         Pain Assessment/Treatment Pain Scale: 0/10. Method: Numeric Pain Intensity  Scale. Location: arm. Onset: 06/08/2014. Duration: varies. Quality: discomforting. Pain Assessment/Treatment follow-up plan of care: Patient taking medication as prescribed..  Fall Risk Plan The patient has not fallen in the last year.  Scheduled for battery replacement.    Orders: Instruction(s)/Education: Assessment Instruction  I10 Hypertension education             Provider:  Danae OrleansJoseph D. Venetia MaxonStern MD  08/27/2016 04:36 PM Dictation edited by: Jorje GuildEmma Lewis    CC Providers: Maeola HarmanJoseph Copper Kirtley MD [address] [place], [place] [id number]              Electronically signed by Danae OrleansJoseph D. Venetia MaxonStern MD on 08/30/2016 02:57 PM

## 2016-09-12 ENCOUNTER — Ambulatory Visit: Payer: Medicare Other | Admitting: Internal Medicine

## 2016-09-12 DIAGNOSIS — Z4542 Encounter for adjustment and management of neuropacemaker (brain) (peripheral nerve) (spinal cord): Secondary | ICD-10-CM | POA: Diagnosis not present

## 2016-09-12 MED FILL — Vancomycin HCl For IV Soln 1 GM (Base Equivalent): INTRAVENOUS | Qty: 1000 | Status: AC

## 2016-09-12 NOTE — OR Nursing (Signed)
Pt was discharged home into the care of his male partner.

## 2016-09-12 NOTE — Anesthesia Postprocedure Evaluation (Addendum)
Anesthesia Post Note  Patient: David Rhodes  Procedure(s) Performed: Procedure(s) (LRB): Left chest-Change implantable pulse generator battery (Left)  Patient location during evaluation: PACU Anesthesia Type: General Level of consciousness: awake and alert Pain management: pain level controlled Vital Signs Assessment: post-procedure vital signs reviewed and stable Respiratory status: spontaneous breathing, nonlabored ventilation, respiratory function stable and patient connected to nasal cannula oxygen Cardiovascular status: stable and blood pressure returned to baseline Anesthetic complications: no       Last Vitals:  Vitals:   09/11/16 1745 09/11/16 1800  BP: 112/83 (!) 146/97  Pulse: (!) 57 83  Resp: 15 16  Temp:  36.6 C    Last Pain:  Vitals:   09/11/16 1215  TempSrc: Oral                 Saesha Llerenas S

## 2016-09-12 NOTE — Anesthesia Postprocedure Evaluation (Deleted)
Anesthesia Post Note  Patient: David Rhodes  Procedure(s) Performed: Procedure(s) (LRB): Left chest-Change implantable pulse generator battery (Left)  Patient location during evaluation: PACU Anesthesia Type: General Level of consciousness: awake and alert Pain management: pain level controlled Vital Signs Assessment: post-procedure vital signs reviewed and stable Respiratory status: spontaneous breathing, nonlabored ventilation, respiratory function stable and patient connected to nasal cannula oxygen Cardiovascular status: blood pressure returned to baseline and stable Postop Assessment: no signs of nausea or vomiting Anesthetic complications: no       Last Vitals:  Vitals:   09/11/16 1745 09/11/16 1800  BP: 112/83 (!) 146/97  Pulse: (!) 57 83  Resp: 15 16  Temp:  36.6 C    Last Pain:  Vitals:   09/11/16 1215  TempSrc: Oral                 David Rhodes S

## 2016-09-15 ENCOUNTER — Ambulatory Visit: Payer: Medicare Other | Admitting: Internal Medicine

## 2016-09-15 ENCOUNTER — Encounter (HOSPITAL_COMMUNITY): Payer: Self-pay | Admitting: Neurosurgery

## 2016-09-16 ENCOUNTER — Encounter: Payer: Self-pay | Admitting: Internal Medicine

## 2016-09-16 ENCOUNTER — Ambulatory Visit (INDEPENDENT_AMBULATORY_CARE_PROVIDER_SITE_OTHER): Payer: Medicare Other | Admitting: Internal Medicine

## 2016-09-16 VITALS — BP 120/80 | HR 101 | Ht 72.0 in | Wt 169.0 lb

## 2016-09-16 DIAGNOSIS — Z8719 Personal history of other diseases of the digestive system: Secondary | ICD-10-CM

## 2016-09-16 DIAGNOSIS — K219 Gastro-esophageal reflux disease without esophagitis: Secondary | ICD-10-CM

## 2016-09-16 DIAGNOSIS — G249 Dystonia, unspecified: Secondary | ICD-10-CM

## 2016-09-16 DIAGNOSIS — Z9889 Other specified postprocedural states: Secondary | ICD-10-CM

## 2016-09-16 DIAGNOSIS — J449 Chronic obstructive pulmonary disease, unspecified: Secondary | ICD-10-CM

## 2016-09-16 DIAGNOSIS — I10 Essential (primary) hypertension: Secondary | ICD-10-CM

## 2016-09-16 MED ORDER — FLUTICASONE FUROATE-VILANTEROL 100-25 MCG/INH IN AEPB: 1.0000 | INHALATION_SPRAY | Freq: Every day | RESPIRATORY_TRACT | 5 refills | Status: DC

## 2016-09-16 NOTE — Progress Notes (Signed)
Date:  09/16/2016   Name:  David Rhodes   DOB:  11-Sep-1955   MRN:  [id number]   Chief Complaint: Hypertension and Allergies Hypertension  This is a chronic problem. The current episode started more than 1 year ago. The problem is controlled. Associated symptoms include shortness of breath. Pertinent negatives include no chest pain, headaches or palpitations. Past treatments include ACE inhibitors and calcium channel blockers.  Gastroesophageal Reflux  He complains of abdominal pain, coughing and wheezing. He reports no chest pain or no nausea. Pertinent negatives include no fatigue. He has tried a PPI for the symptoms. The treatment provided significant relief.   Seasonal allergies - having onset of sx for the past few weeks. Taking allegra and singulair. Had antibiotics last month for discolored phelgm.  COPD - on Breo and Spiriva and doing fairly well.  Groin strain - strained his groin lifting 50 lb dog food bag.  He is s/p hernia repair bilaterally in 2015 and is concerned he may have another hernia.  Dyskinesia - has DBS with recent battery change.  He thinks the settings need to be adjusted as he still has some spasticity.  He continues on Requip as well.  Lab Results  Component Value Date   WBC 7.5 09/11/2016   HGB 14.9 09/11/2016   HCT 45.7 09/11/2016   MCV 89.4 09/11/2016   PLT 199 09/11/2016   Lab Results  Component Value Date   CREATININE 0.87 09/11/2016    Review of Systems  Constitutional: Negative for chills, fatigue, fever and unexpected weight change.  HENT: Negative for hearing loss.   Respiratory: Positive for cough, shortness of breath and wheezing. Negative for chest tightness.   Cardiovascular: Negative for chest pain, palpitations and leg swelling.  Gastrointestinal: Positive for abdominal pain. Negative for constipation, nausea and vomiting.  Neurological: Negative for dizziness and headaches.    Patient Active Problem List   Diagnosis Date Noted   . Surgery, elective 09/11/2016  . Pulmonary nodules 12/31/2015  . COPD (chronic obstructive pulmonary disease) (HCC) 12/27/2015  . Chronic cough 12/27/2015  . Reactive airway disease with acute exacerbation 09/24/2015  . Allergic rhinitis 11/11/2014  . Bursitis of shoulder 11/11/2014  . Nerve root pain 11/11/2014  . Dyskinesia 11/11/2014  . Acid reflux 11/11/2014  . Calcium blood increased 11/11/2014  . Affective disorder (HCC) 11/11/2014  . Has a tremor 11/11/2014  . Screening for colon cancer 12/25/2011  . Hypertension 12/25/2011  . Chronic back pain 12/25/2011    Prior to Admission medications   Medication Sig Start Date End Date Taking? Authorizing Provider  albuterol (PROVENTIL HFA;VENTOLIN HFA) 108 (90 Base) MCG/ACT inhaler Inhale 1-2 puffs into the lungs every 6 (six) hours as needed for wheezing or shortness of breath. 09/08/16  Yes Reubin Milan, MD  amLODipine (NORVASC) 10 MG tablet Take 1 tablet (10 mg total) by mouth daily. 09/08/16  Yes Reubin Milan, MD  fexofenadine (ALLEGRA) 180 MG tablet Take 180 mg by mouth daily.   Yes Historical Provider, MD  fluticasone furoate-vilanterol (BREO ELLIPTA) 100-25 MCG/INH AEPB Inhale 1 puff into the lungs daily. 09/08/16  Yes Reubin Milan, MD  gabapentin (NEURONTIN) 600 MG tablet Take 1 tablet (600 mg total) by mouth 4 (four) times daily. 09/01/16  Yes Reubin Milan, MD  lisinopril (PRINIVIL,ZESTRIL) 40 MG tablet Take 1 tablet (40 mg total) by mouth daily. 09/08/16  Yes Reubin Milan, MD  montelukast (SINGULAIR) 10 MG tablet Take 1 tablet (10  mg total) by mouth at bedtime. 09/08/16  Yes Reubin MilanLaura H Julianne Chamberlin, MD  Omeprazole 20 MG TBEC Take 1 tablet by mouth daily.   Yes Historical Provider, MD  rOPINIRole (REQUIP) 2 MG tablet Take 1 tablet (2 mg total) by mouth 2 (two) times daily. 09/01/16  Yes Reubin MilanLaura H Erisa Mehlman, MD  tiotropium (SPIRIVA HANDIHALER) 18 MCG inhalation capsule Place 1 capsule (18 mcg total) into inhaler and inhale  daily. 09/08/16  Yes Reubin MilanLaura H Rhiannon Sassaman, MD    Allergies  Allergen Reactions  . Penicillins Shortness Of Breath    Has patient had a PCN reaction causing immediate rash, facial/tongue/throat swelling, SOB or lightheadedness with hypotension: Yes Has patient had a PCN reaction causing severe rash involving mucus membranes or skin necrosis: No Has patient had a PCN reaction that required hospitalization No Has patient had a PCN reaction occurring within the last 10 years: No If all of the above answers are "NO", then may proceed with Cephalosporin use.     Past Surgical History:  Procedure Laterality Date  . DEEP BRAIN STIMULATOR PLACEMENT    . INGUINAL HERNIA REPAIR Bilateral 02/07/2014   Procedure:  BILATERAL INGUINAL HERNIA REPAIR;  Surgeon: Axel FillerArmando Ramirez, MD;  Location: [facility] OR;  Service: General;  Laterality: Bilateral;  . INSERTION OF MESH Bilateral 02/07/2014   Procedure: INSERTION OF MESH;  Surgeon: Axel FillerArmando Ramirez, MD;  Location: [facility] OR;  Service: General;  Laterality: Bilateral;  . NASAL SINUS SURGERY    . PULSE GENERATOR IMPLANT Left 09/11/2016   Procedure: Left chest-Change implantable pulse generator battery;  Surgeon: Maeola HarmanJoseph Stern, MD;  [facility];  Service: Neurosurgery;  Laterality: Left;  Left chest-Change implantable pulse generator battery  . SUBTHALAMIC STIMULATOR BATTERY REPLACEMENT  12/09/2011   Procedure: SUBTHALAMIC STIMULATOR BATTERY REPLACEMENT;  Surgeon: Maeola HarmanJoseph Stern, MD;  Location: [facility] NEURO ORS;  Service: Neurosurgery;  Laterality: N/A;   deep brain stimulator, implantable pulse generator change  . SUBTHALAMIC STIMULATOR BATTERY REPLACEMENT Left 04/21/2014   Procedure: Deep brain stimulator battery change;  Surgeon: Maeola HarmanJoseph Stern, MD;  Location: [facility] NEURO ORS;  Service: Neurosurgery;  Laterality: Left;  Deep brain stimulator battery change    Social History  Substance Use Topics  . Smoking status: Never Smoker  . Smokeless tobacco: Current User    Types: Snuff    . Alcohol use No     Medication list has been reviewed and updated.   Physical Exam  Constitutional: He is oriented to person, place, and time. He appears well-developed. No distress.  HENT:  Head: Normocephalic and atraumatic.  Neck: Normal range of motion. Neck supple.  Cardiovascular: Normal rate, regular rhythm and normal heart sounds.   Pulmonary/Chest: Effort normal and breath sounds normal. No respiratory distress. He has no wheezes.  Abdominal: Soft. Bowel sounds are normal. He exhibits no distension. Hernia confirmed negative in the ventral area, confirmed negative in the right inguinal area and confirmed negative in the left inguinal area.  Musculoskeletal: He exhibits no edema or tenderness.  Neurological: He is alert and oriented to person, place, and time.  Skin: Skin is warm and dry. No rash noted.  Psychiatric: He has a normal mood and affect. His behavior is normal. Thought content normal.  Nursing note and vitals reviewed.   BP 138/82 (BP Location: Right Arm, Patient Position: Sitting, Cuff Size: Normal)   Pulse (!) 101   Ht 6' (1.829 m)   Wt 169 lb (76.7 kg)   SpO2 99%   BMI 22.92 kg/m  Assessment and Plan: 1. Essential hypertension controlled  2. Dyskinesia Followed by Neurosurgery for deep brain stimulator Continue Requip and gabapentin  3. Chronic obstructive pulmonary disease, unspecified COPD type (HCC) Stable - continue current regimen  4. Gastroesophageal reflux disease, esophagitis presence not specified Controlled on PPI  5. Hx of bilateral inguinal hernia repair No evidence of hernia Likely strain - avoid heavy lifting and f/u if needed   Meds ordered this encounter  Medications  . DISCONTD: fluticasone furoate-vilanterol (BREO ELLIPTA) 100-25 MCG/INH AEPB    Sig: Inhale 1 puff into the lungs daily.    Dispense:  28 each    Refill:  5  . fluticasone furoate-vilanterol (BREO ELLIPTA) 100-25 MCG/INH AEPB    Sig: Inhale 1 puff into  the lungs daily.    Dispense:  28 each    Refill:  5    Bari Edward, MD [facility]  09/16/2016

## 2016-09-18 ENCOUNTER — Telehealth: Payer: Self-pay | Admitting: Neurology

## 2016-09-18 NOTE — Telephone Encounter (Signed)
Spoke with patient and his tremor is not as controlled as he would like it to be. No other symptoms. Nothing acute. Moved appt up to next available on May 7.

## 2016-09-18 NOTE — Telephone Encounter (Signed)
PT wants to see Dr Tat today or tomorrow to adjust his brain stimulator

## 2016-10-09 ENCOUNTER — Telehealth: Payer: Self-pay | Admitting: Neurology

## 2016-10-09 NOTE — Telephone Encounter (Signed)
Patient states having tremors when he picks up things. Wanted to be seen today. Made him aware that we gave him next available appt. Not acute.

## 2016-10-09 NOTE — Telephone Encounter (Signed)
Patient states that he wanted to come in sooner to see Dr Tat so I put him on 10-14-16 at 8:15. He states that he is having a lot of problems with his tremors. He would like would like to talk to someone today please call
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
## 2016-10-10 NOTE — Discharge Summary (Signed)
Physician Discharge Summary  Patient ID: David Rhodes MRN: [id number] DOB/AGE: May 02, 1956 61 y.o.  Admit date: 09/11/2016 Discharge date: 10/10/2016  Admission Diagnoses:Essential tremor  Discharge Diagnoses: Same Active Problems:   Surgery, elective   Discharged Condition: good  Hospital Course: Patient underwent revision of depleted implantable pulse generator for VIM [place] bilateral DBS electrodes.  He tolerated the procedure well and was discharged home later that day.  Consults: None  Significant Diagnostic Studies: None  Treatments: surgery:Patient underwent revision of depleted implantable pulse generator for VIM [place] bilateral DBS electrodes  Discharge Exam: Blood pressure (!) 146/97, pulse 83, temperature 97.8 F (36.6 C), resp. rate 16, height 6' (1.829 m), weight 70.3 kg (155 lb), SpO2 100 %. Neurologic: Alert and oriented X 3, normal strength and tone. Normal symmetric reflexes. Normal coordination and gait Wound:CDI  Disposition: Home   Allergies as of 09/12/2016      Reactions   Penicillins Shortness Of Breath   Has patient had a PCN reaction causing immediate rash, facial/tongue/throat swelling, SOB or lightheadedness with hypotension: Yes Has patient had a PCN reaction causing severe rash involving mucus membranes or skin necrosis: No Has patient had a PCN reaction that required hospitalization No Has patient had a PCN reaction occurring within the last 10 years: No If all of the above answers are "NO", then may proceed with Cephalosporin use.      Medication List    ASK your doctor about these medications   albuterol 108 (90 Base) MCG/ACT inhaler Commonly known as:  PROVENTIL HFA;VENTOLIN HFA Inhale 1-2 puffs into the lungs every 6 (six) hours as needed for wheezing or shortness of breath.   amLODipine 10 MG tablet Commonly known as:  NORVASC Take 1 tablet (10 mg total) by mouth daily.   fexofenadine 180 MG tablet Commonly known as:   ALLEGRA Take 180 mg by mouth daily.   gabapentin 600 MG tablet Commonly known as:  NEURONTIN Take 1 tablet (600 mg total) by mouth 4 (four) times daily.   lisinopril 40 MG tablet Commonly known as:  PRINIVIL,ZESTRIL Take 1 tablet (40 mg total) by mouth daily.   montelukast 10 MG tablet Commonly known as:  SINGULAIR Take 1 tablet (10 mg total) by mouth at bedtime.   Omeprazole 20 MG Tbec Take 1 tablet by mouth daily.   rOPINIRole 2 MG tablet Commonly known as:  REQUIP Take 1 tablet (2 mg total) by mouth 2 (two) times daily.   tiotropium 18 MCG inhalation capsule Commonly known as:  SPIRIVA HANDIHALER Place 1 capsule (18 mcg total) into inhaler and inhale daily.        Signed: Dorian Heckle, MD 10/10/2016, 2:20 PM

## 2016-10-14 ENCOUNTER — Ambulatory Visit (INDEPENDENT_AMBULATORY_CARE_PROVIDER_SITE_OTHER): Payer: Medicare Other | Admitting: Neurology

## 2016-10-14 ENCOUNTER — Encounter: Payer: Self-pay | Admitting: Neurology

## 2016-10-14 VITALS — BP 140/90 | HR 60 | Ht 72.0 in | Wt 159.0 lb

## 2016-10-14 DIAGNOSIS — G2581 Restless legs syndrome: Secondary | ICD-10-CM

## 2016-10-14 DIAGNOSIS — G250 Essential tremor: Secondary | ICD-10-CM | POA: Diagnosis not present

## 2016-10-14 NOTE — Procedures (Signed)
DBS Programming was performed.    Total time spent programming was 20 minutes.  Device was confirmed to be on.  Soft start was confirmed to be on.  Impedences were checked and were within normal limits.  Battery was checked and was determined to be functioning normally and close to end of life but not quite (2.64).  Final settings were as follows:  Group A:  Left brain electrode:     2-1+           ; Amplitude  3.7   V   ; Pulse width 90 microseconds;   Frequency   135   Hz.  Right brain electrode:     7-C+          ; Amplitude   3.4  V ;  Pulse width 110  microseconds;  Frequency   135    Hz.  Group B  Left brain electrode:     2-C+           ; Amplitude  4.7   V   ; Pulse width 60 microseconds;   Frequency   150   Hz.  Right brain electrode:     7-C+          ; Amplitude   3.7  V ;  Pulse width 90  microseconds;  Frequency   150    Hz.  Group B was active when pt left the office

## 2016-11-03 ENCOUNTER — Ambulatory Visit: Payer: Medicare Other | Admitting: Neurology

## 2016-11-04 ENCOUNTER — Telehealth: Payer: Self-pay | Admitting: Internal Medicine

## 2016-11-04 NOTE — Telephone Encounter (Signed)
Ms.Smith called stating that the patient had a device placed in his chest about a month about ago and it's beginning to ooze pus. Patient was wondering if Dr. Judithann GravesBerglund could look at him and give him an antibiotic. Spoke with Chassity Cox, CMA for Dr.Berglund and patient was advised to go back to Dr. Fredrich BirksStern's office, the provider who did the surgery, or go the ED. Patient verbalized that [place], [place] [facility] [place] [facility] [place] [facility] but did verbalize understanding.

## 2016-12-01 ENCOUNTER — Other Ambulatory Visit: Payer: Self-pay | Admitting: Internal Medicine

## 2016-12-01 NOTE — Addendum Note (Signed)
Addendum  created 12/01/16 1224 by Kierstan Auer, MD   Sign clinical note    

## 2017-01-01 ENCOUNTER — Ambulatory Visit: Payer: Medicare Other | Admitting: Neurology

## 2017-03-18 ENCOUNTER — Ambulatory Visit: Payer: Medicare Other | Admitting: Internal Medicine

## 2017-03-21 ENCOUNTER — Encounter: Payer: Self-pay | Admitting: Gynecology

## 2017-03-21 ENCOUNTER — Emergency Department: Payer: Medicare Other

## 2017-03-21 ENCOUNTER — Encounter: Payer: Self-pay | Admitting: Emergency Medicine

## 2017-03-21 ENCOUNTER — Ambulatory Visit
Admission: EM | Admit: 2017-03-21 | Discharge: 2017-03-21 | Disposition: A | Discharge status: Hospice | Payer: Medicare Other | Attending: Emergency Medicine | Admitting: Emergency Medicine

## 2017-03-21 ENCOUNTER — Emergency Department
Admission: EM | Admit: 2017-03-21 | Discharge: 2017-03-21 | Disposition: A | Discharge status: Home / Self Care | Payer: Medicare Other | Attending: Emergency Medicine | Admitting: Emergency Medicine

## 2017-03-21 DIAGNOSIS — F17290 Nicotine dependence, other tobacco product, uncomplicated: Secondary | ICD-10-CM | POA: Diagnosis not present

## 2017-03-21 DIAGNOSIS — R0602 Shortness of breath: Secondary | ICD-10-CM | POA: Diagnosis not present

## 2017-03-21 DIAGNOSIS — I10 Essential (primary) hypertension: Secondary | ICD-10-CM | POA: Diagnosis not present

## 2017-03-21 DIAGNOSIS — Z79899 Other long term (current) drug therapy: Secondary | ICD-10-CM | POA: Diagnosis not present

## 2017-03-21 DIAGNOSIS — R05 Cough: Secondary | ICD-10-CM | POA: Diagnosis not present

## 2017-03-21 DIAGNOSIS — R062 Wheezing: Secondary | ICD-10-CM

## 2017-03-21 DIAGNOSIS — J441 Chronic obstructive pulmonary disease with (acute) exacerbation: Secondary | ICD-10-CM | POA: Insufficient documentation

## 2017-03-21 DIAGNOSIS — J45909 Unspecified asthma, uncomplicated: Secondary | ICD-10-CM | POA: Insufficient documentation

## 2017-03-21 DIAGNOSIS — R0789 Other chest pain: Secondary | ICD-10-CM

## 2017-03-21 MED ORDER — ALBUTEROL SULFATE HFA 108 (90 BASE) MCG/ACT IN AERS: 2.0000 | INHALATION_SPRAY | Freq: Four times a day (QID) | RESPIRATORY_TRACT | 0 refills | Status: DC | PRN

## 2017-03-21 MED ORDER — METHYLPREDNISOLONE SODIUM SUCC 125 MG IJ SOLR
125.0000 mg | Freq: Once | INTRAMUSCULAR | Status: AC
  Administered 2017-03-21: 125 mg via INTRAVENOUS

## 2017-03-21 MED ORDER — METHYLPREDNISOLONE SODIUM SUCC 125 MG IJ SOLR: 125.0000 mg | Freq: Once | INTRAMUSCULAR | Status: DC

## 2017-03-21 MED ORDER — IPRATROPIUM-ALBUTEROL 0.5-2.5 (3) MG/3ML IN SOLN
3.0000 mL | Freq: Once | RESPIRATORY_TRACT | Status: AC
  Administered 2017-03-21: 3 mL via RESPIRATORY_TRACT

## 2017-03-21 MED ORDER — PREDNISONE 20 MG PO TABS: 40.0000 mg | ORAL_TABLET | Freq: Every day | ORAL | 0 refills | Status: DC

## 2017-03-21 NOTE — ED Notes (Signed)
Spoke with dr. Derrill Kay - pt has a brain stimulator per nurse Marylene Land so the ekg has a lot of interference. Per dr. Johnna Acosta looks fine and he accepts it.

## 2017-03-21 NOTE — ED Provider Notes (Signed)
[facility] Emergency Department Provider Note   ____________________________________________   I have reviewed the triage vital signs and the nursing notes.   HISTORY  Chief Complaint Shortness of Breath   History limited by: Not Limited   HPI David Rhodes is a 61 y.o. male who presents to the emergency department today because of concerns forshortness breath. Patient initially was seen in urgent care. Patient does have a history of COPD. He states that his breathing has been getting worse for the past week. He does have an inhaler at home which he is tried without any significant relief. He denies any associated chest pain although has had a cough. He denies any bloody cough. At urgent care he was given Solu-Medrol and DuoNeb treatments and states that he does feel better.   Past Medical History:  Diagnosis Date  . Allergy    Hay fever  . Anxiety   . Asthma   . Chronic pain due to trauma   . COPD (chronic obstructive pulmonary disease) (HCC)   . Depression   . GERD (gastroesophageal reflux disease)   . Headache(784.0)   . Hypertension   . Inguinal hernia    bilateral  . Neuromuscular disorder (HCC)    hx of tremors  . Pneumonia    many years ago  . Shortness of breath    when allergies flare up  . Urine incontinence   . Wears glasses     Patient Active Problem List   Diagnosis Date Noted  . Surgery, elective 09/11/2016  . Pulmonary nodules 12/31/2015  . COPD (chronic obstructive pulmonary disease) (HCC) 12/27/2015  . Chronic cough 12/27/2015  . Reactive airway disease with acute exacerbation 09/24/2015  . Allergic rhinitis 11/11/2014  . Bursitis of shoulder 11/11/2014  . Nerve root pain 11/11/2014  . Dyskinesia 11/11/2014  . Acid reflux 11/11/2014  . Calcium blood increased 11/11/2014  . Affective disorder (HCC) 11/11/2014  . Has a tremor 11/11/2014  . Screening for colon cancer 12/25/2011  . Hypertension 12/25/2011  . Chronic  back pain 12/25/2011    Past Surgical History:  Procedure Laterality Date  . DEEP BRAIN STIMULATOR PLACEMENT    . INGUINAL HERNIA REPAIR Bilateral 02/07/2014   Procedure:  BILATERAL INGUINAL HERNIA REPAIR;  Surgeon: Axel Filler, MD;  Location: [facility] OR;  Service: General;  Laterality: Bilateral;  . INSERTION OF MESH Bilateral 02/07/2014   Procedure: INSERTION OF MESH;  Surgeon: Axel Filler, MD;  Location: [facility] OR;  Service: General;  Laterality: Bilateral;  . NASAL SINUS SURGERY    . PULSE GENERATOR IMPLANT Left 09/11/2016   Procedure: Left chest-Change implantable pulse generator battery;  Surgeon: Maeola Harman, MD;  Location: [facility] OR;  Service: Neurosurgery;  Laterality: Left;  Left chest-Change implantable pulse generator battery  . SUBTHALAMIC STIMULATOR BATTERY REPLACEMENT  12/09/2011   Procedure: SUBTHALAMIC STIMULATOR BATTERY REPLACEMENT;  Surgeon: Maeola Harman, MD;  Location: [facility];  Service: Neurosurgery;  Laterality: N/A;   deep brain stimulator, implantable pulse generator change  . SUBTHALAMIC STIMULATOR BATTERY REPLACEMENT Left 04/21/2014   Procedure: Deep brain stimulator battery change;  Surgeon: Maeola Harman, MD;  Location: [facility];  Service: Neurosurgery;  Laterality: Left;  Deep brain stimulator battery change    Prior to Admission medications   Medication Sig Start Date End Date Taking? Authorizing Provider  albuterol (PROVENTIL HFA;VENTOLIN HFA) 108 (90 Base) MCG/ACT inhaler Inhale 1-2 puffs into the lungs every 6 (six) hours as needed for wheezing or shortness of breath.  09/08/16   Reubin Milan, MD  amLODipine (NORVASC) 10 MG tablet Take 1 tablet (10 mg total) by mouth daily. 09/08/16   Reubin Milan, MD  fexofenadine (ALLEGRA) 180 MG tablet Take 180 mg by mouth daily.    [provider]  fluticasone furoate-vilanterol (BREO ELLIPTA) 100-25 MCG/INH AEPB Inhale 1 puff into the lungs daily. 09/16/16   Reubin Milan, MD  gabapentin (NEURONTIN)  600 MG tablet TAKE 1 TABLET(600 MG) BY MOUTH FOUR TIMES DAILY 12/01/16   Reubin Milan, MD  lisinopril (PRINIVIL,ZESTRIL) 40 MG tablet Take 1 tablet (40 mg total) by mouth daily. 09/08/16   Reubin Milan, MD  montelukast (SINGULAIR) 10 MG tablet Take 1 tablet (10 mg total) by mouth at bedtime. 09/08/16   Reubin Milan, MD  Omeprazole 20 MG TBEC Take 1 tablet by mouth daily.    [provider]  rOPINIRole (REQUIP) 2 MG tablet TAKE 1 TABLET(2 MG) BY MOUTH TWICE DAILY 12/01/16   Reubin Milan, MD  tiotropium (SPIRIVA HANDIHALER) 18 MCG inhalation capsule Place 1 capsule (18 mcg total) into inhaler and inhale daily. 09/08/16   Reubin Milan, MD    Allergies Penicillins  Family History  Problem Relation Age of Onset  . Myoclonus Mother   . Breast cancer Mother   . Prostate cancer Father   . Heart disease Paternal Grandfather   . Myoclonus Maternal Uncle     Social History Social History  Substance Use Topics  . Smoking status: Never Smoker  . Smokeless tobacco: Current User    Types: Snuff  . Alcohol use No    Review of Systems Constitutional: No fever/chills Eyes: No visual changes. ENT: No sore throat. Cardiovascular: Denies chest pain. Respiratory: Positive for shortness of breath. Gastrointestinal: No abdominal pain.  No nausea, no vomiting.  No diarrhea.   Genitourinary: Negative for dysuria. Musculoskeletal: Negative for back pain. Skin: Negative for rash. Neurological: Negative for headaches, focal weakness or numbness.  ____________________________________________   PHYSICAL EXAM:  VITAL SIGNS: ED Triage Vitals  Enc Vitals Group     BP 03/21/17 1250 (!) 141/101     Pulse Rate 03/21/17 1250 (!) 103     Resp 03/21/17 1250 20     Temp 03/21/17 1255 98 F (36.7 C)     Temp Source 03/21/17 1250 Oral     SpO2 03/21/17 1248 95 %   Constitutional: Alert and oriented. Well appearing and in no distress. Eyes: Conjunctivae are normal.  ENT    Head: Normocephalic and atraumatic.   Nose: No congestion/rhinnorhea.   Mouth/Throat: Mucous membranes are moist.   Neck: No stridor. Hematological/Lymphatic/Immunilogical: No cervical lymphadenopathy. Cardiovascular: Normal rate, regular rhythm.  No murmurs, rubs, or gallops.  Respiratory: Normal respiratory effort without tachypnea nor retractions. Diffuse expiratory wheezing.  Gastrointestinal: Soft and non tender. No rebound. No guarding.  Genitourinary: Deferred Musculoskeletal: Normal range of motion in all extremities. No lower extremity edema. Neurologic:  Normal speech and language. No gross focal neurologic deficits are appreciated.  Skin:  Skin is warm, dry and intact. No rash noted. Psychiatric: Mood and affect are normal. Speech and behavior are normal. Patient exhibits appropriate insight and judgment.  ____________________________________________    LABS (pertinent positives/negatives)  None  ____________________________________________   EKG  I, Phineas Semen, attending physician, personally viewed and interpreted this EKG  EKG Time: 1334 Rate: 87 Rhythm: normal sinus rhythm Axis: normal Intervals: qtc 423 QRS: narrow ST changes: no st elevation Impression: Artifact present  which limits interpretation.     ____________________________________________    RADIOLOGY  CXR No acute abnormality  ____________________________________________   PROCEDURES  Procedures  ____________________________________________   INITIAL IMPRESSION / ASSESSMENT AND PLAN / ED COURSE  Pertinent labs & imaging results that were available during my care of the patient were reviewed by me and considered in my medical decision making (see chart for details).  patient presented to the emergency department today because of concerns for shortness of breath. Patient did feel better by the time he arrived to the emergency department after getting some Medrol  and DuoNeb treatments at urgent care. Patient did well off of oxygen here. X-ray did not show any pneumonia. Will discharge with further prednisone and albuterol inhaler.  ____________________________________________   FINAL CLINICAL IMPRESSION(S) / ED DIAGNOSES  Final diagnoses:  COPD exacerbation (HCC)     Note: This dictation was prepared with Dragon dictation. Any transcriptional errors that result from this process are unintentional     Phineas Semen, MD 03/21/17 1445

## 2017-03-21 NOTE — ED Triage Notes (Signed)
Pt brought in by [facility] from [facility] for Shortness of breath over the last several days. Pt states that he was feeling some better but gets more short of breathing with activity. Pt was give 125 mg of Solumedrol and a total of 3 Duo-nebs PTA. Pt does not appear to be in any distress at this time and is texting on his cell phone.

## 2017-03-21 NOTE — Discharge Instructions (Signed)
Please seek medical attention for any high fevers, chest pain, shortness of breath, change in behavior, persistent vomiting, bloody stool or any other new or concerning symptoms.  

## 2017-03-21 NOTE — ED Provider Notes (Signed)
HPI  SUBJECTIVE:  David Rhodes is a 61 y.o. male who presents with 1 week of worsening shortness of breath. He reports chest pain described as soreness secondary to the cough. He reports wheezing, dyspnea on exertion less than 5 feet, states this is worse than his baseline. He reports waking up at night gasping for air and unintentional weight loss over the past year. He reports a cough productive of beige sputum but this is not changed in color or amount. No fevers. No chest pressure, heaviness, nausea, diaphoresis, palpitations. No lower extremity edema, unintentional weight gain, nocturia, orthopnea, abdominal pain. No calf pain or swelling, surgery in the past 4 weeks, recent immobilization, hemoptysis. He tried Spiriva and an unknown inhaler. He has also used a friend's unknown inhaler with some improvement in his symptoms. Symptoms are worse with going onto the heat, exertion. In the past medical history of COPD, hypertension, deep brain stimulator placement. He is not a smoker. No history of PE, DVT, CHF, MI, cancer, diabetes. PMD: Dr. Judithann Graves.  Past Medical History:  Diagnosis Date  . Allergy    Hay fever  . Anxiety   . Asthma   . Chronic pain due to trauma   . COPD (chronic obstructive pulmonary disease) (HCC)   . Depression   . GERD (gastroesophageal reflux disease)   . Headache(784.0)   . Hypertension   . Inguinal hernia    bilateral  . Neuromuscular disorder (HCC)    hx of tremors  . Pneumonia    many years ago  . Shortness of breath    when allergies flare up  . Urine incontinence   . Wears glasses     Past Surgical History:  Procedure Laterality Date  . DEEP BRAIN STIMULATOR PLACEMENT    . INGUINAL HERNIA REPAIR Bilateral 02/07/2014   Procedure:  BILATERAL INGUINAL HERNIA REPAIR;  Surgeon: Axel Filler, MD;  Location: [facility] OR;  Service: General;  Laterality: Bilateral;  . INSERTION OF MESH Bilateral 02/07/2014   Procedure: INSERTION OF MESH;  Surgeon: Axel Filler, MD;  Location: [facility] OR;  Service: General;  Laterality: Bilateral;  . NASAL SINUS SURGERY    . PULSE GENERATOR IMPLANT Left 09/11/2016   Procedure: Left chest-Change implantable pulse generator battery;  Surgeon: Maeola Harman, MD;  Location: [facility] OR;  Service: Neurosurgery;  Laterality: Left;  Left chest-Change implantable pulse generator battery  . SUBTHALAMIC STIMULATOR BATTERY REPLACEMENT  12/09/2011   Procedure: SUBTHALAMIC STIMULATOR BATTERY REPLACEMENT;  Surgeon: Maeola Harman, MD;  Location: [facility];  Service: Neurosurgery;  Laterality: N/A;   deep brain stimulator, implantable pulse generator change  . SUBTHALAMIC STIMULATOR BATTERY REPLACEMENT Left 04/21/2014   Procedure: Deep brain stimulator battery change;  Surgeon: Maeola Harman, MD;  Location: [facility];  Service: Neurosurgery;  Laterality: Left;  Deep brain stimulator battery change    Family History  Problem Relation Age of Onset  . Myoclonus Mother   . Breast cancer Mother   . Prostate cancer Father   . Heart disease Paternal Grandfather   . Myoclonus Maternal Uncle     Social History  Substance Use Topics  . Smoking status: Never Smoker  . Smokeless tobacco: Current User    Types: Snuff  . Alcohol use No    No current facility-administered medications for this encounter.   Current Outpatient Prescriptions:  .  albuterol (PROVENTIL HFA;VENTOLIN HFA) 108 (90 Base) MCG/ACT inhaler, Inhale 1-2 puffs into the lungs every 6 (six) hours as needed for wheezing or  shortness of breath., Disp: 1 Inhaler, Rfl: 0 .  amLODipine (NORVASC) 10 MG tablet, Take 1 tablet (10 mg total) by mouth daily., Disp: 30 tablet, Rfl: 5 .  fexofenadine (ALLEGRA) 180 MG tablet, Take 180 mg by mouth daily., Disp: , Rfl:  .  fluticasone furoate-vilanterol (BREO ELLIPTA) 100-25 MCG/INH AEPB, Inhale 1 puff into the lungs daily., Disp: 28 each, Rfl: 5 .  gabapentin (NEURONTIN) 600 MG tablet, TAKE 1 TABLET(600 MG) BY MOUTH FOUR TIMES DAILY,  Disp: 120 tablet, Rfl: 3 .  lisinopril (PRINIVIL,ZESTRIL) 40 MG tablet, Take 1 tablet (40 mg total) by mouth daily., Disp: 30 tablet, Rfl: 5 .  montelukast (SINGULAIR) 10 MG tablet, Take 1 tablet (10 mg total) by mouth at bedtime., Disp: 30 tablet, Rfl: 5 .  Omeprazole 20 MG TBEC, Take 1 tablet by mouth daily., Disp: , Rfl:  .  rOPINIRole (REQUIP) 2 MG tablet, TAKE 1 TABLET(2 MG) BY MOUTH TWICE DAILY, Disp: 180 tablet, Rfl: 3 .  tiotropium (SPIRIVA HANDIHALER) 18 MCG inhalation capsule, Place 1 capsule (18 mcg total) into inhaler and inhale daily., Disp: 30 capsule, Rfl: 5  Allergies  Allergen Reactions  . Penicillins Shortness Of Breath    Has patient had a PCN reaction causing immediate rash, facial/tongue/throat swelling, SOB or lightheadedness with hypotension: Yes Has patient had a PCN reaction causing severe rash involving mucus membranes or skin necrosis: No Has patient had a PCN reaction that required hospitalization No Has patient had a PCN reaction occurring within the last 10 years: No If all of the above answers are "NO", then may proceed with Cephalosporin use.      ROS  As noted in HPI.   Physical Exam  BP (!) 145/101 (BP Location: Right Arm)   Pulse (!) 110   Temp 98 F (36.7 C) (Oral)   Resp (!) 28   Ht 6' (1.829 m)   Wt 160 lb (72.6 kg)   SpO2 96%   BMI 21.70 kg/m   Constitutional: Well developed, well nourished, tachypnic, speaking in short sentences only Eyes:  EOMI, conjunctiva normal bilaterally HENT: Normocephalic, atraumatic,mucus membranes moist Respiratory: Significantly increased work of breathing with supraclavicular and abdominal muscle use, audible wheezing from across the room. Prolonged expiratory phase, wheezing throughout with occasional rhonchi.  Cardiovascular: Regular tachycardia no murmurs rubs or gallops. No appreciable JVD. patient gets short of breath with lying down GI: nondistended skin: No rash, skin intact Musculoskeletal: Calves  symmetric, nontender, no edema. Neurologic: Alert & oriented x 3, no focal neuro deficits Psychiatric: Speech and behavior appropriate   ED Course   Medications  ipratropium-albuterol (DUONEB) 0.5-2.5 (3) MG/3ML nebulizer solution 3 mL (3 mLs Nebulization Given 03/21/17 1134)  methylPREDNISolone sodium succinate (SOLU-MEDROL) 125 mg/2 mL injection 125 mg (125 mg Intravenous Given 03/21/17 1138)  ipratropium-albuterol (DUONEB) 0.5-2.5 (3) MG/3ML nebulizer solution 3 mL (3 mLs Nebulization Given 03/21/17 1150)    Orders Placed This Encounter  Procedures  . ED EKG    Standing Status:   Standing    Number of Occurrences:   1    Order Specific Question:   Reason for Exam    Answer:   Chest Pain    No results found for this or any previous visit (from the past 24 hour(s)). No results found.  ED Clinical Impression  COPD exacerbation (HCC)  Shortness of breath  Wheezing  ED Assessment/Plan  EKG: Sinus tachycardia, rate 105. Extensive interference from the deep brain stimulator.   EMS EKG: Sinus tachycardia,  normal axis, no ST elevation.  Post treatment #1, patient still with increased work of breathing, speaking in short sentences, diffuse wheezing and prolonged expiratory phase.  Presentation consistent with a COPD exacerbation, however the patient has significantly increased work of breathing, speaking in few word sentences only, is 92% on room air, tachypneic. initiated treatment with Solu-Medrol 125 mg IV, DuoNeb x 2, however, feel that he needs to go to the ER for prolonged respiratory treatment, observation to make sure that he does not have respiratory failure and comprehensive workup to rule out other causes of his symptoms. IV established. EMS called. Gave report to Larita Fife, Press photographer at [facility].  Meds ordered this encounter  Medications  . ipratropium-albuterol (DUONEB) 0.5-2.5 (3) MG/3ML nebulizer solution 3 mL  . DISCONTD: methylPREDNISolone  sodium succinate (SOLU-MEDROL) 125 mg/2 mL injection 125 mg  . methylPREDNISolone sodium succinate (SOLU-MEDROL) 125 mg/2 mL injection 125 mg  . ipratropium-albuterol (DUONEB) 0.5-2.5 (3) MG/3ML nebulizer solution 3 mL    *This clinic note was created using Scientist, clinical (histocompatibility and immunogenetics). Therefore, there may be occasional mistakes despite careful proofreading.  ?   Domenick Gong, MD 03/21/17 1202

## 2017-04-01 ENCOUNTER — Other Ambulatory Visit: Payer: Self-pay | Admitting: Internal Medicine

## 2017-04-02 ENCOUNTER — Other Ambulatory Visit: Payer: Self-pay | Admitting: Internal Medicine

## 2017-04-02 NOTE — Telephone Encounter (Signed)
Called lvm to schedule appt °
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
## 2017-04-16 ENCOUNTER — Encounter: Payer: Self-pay | Admitting: Neurology

## 2017-04-16 ENCOUNTER — Ambulatory Visit: Payer: Medicare Other | Admitting: Neurology
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
## 2017-04-27 ENCOUNTER — Ambulatory Visit: Payer: Medicare Other | Admitting: Neurology

## 2017-05-08 ENCOUNTER — Other Ambulatory Visit: Payer: Self-pay | Admitting: Internal Medicine

## 2017-05-10 ENCOUNTER — Other Ambulatory Visit: Payer: Self-pay | Admitting: Internal Medicine

## 2017-05-11 ENCOUNTER — Other Ambulatory Visit: Payer: Self-pay | Admitting: Internal Medicine

## 2017-05-19 ENCOUNTER — Encounter: Payer: Self-pay | Admitting: Internal Medicine

## 2017-05-19 ENCOUNTER — Ambulatory Visit: Payer: Medicare Other | Admitting: Internal Medicine

## 2017-05-19 ENCOUNTER — Other Ambulatory Visit: Payer: Self-pay | Admitting: Internal Medicine

## 2017-05-19 VITALS — BP 136/86 | HR 100 | Ht 72.0 in | Wt 171.0 lb

## 2017-05-19 DIAGNOSIS — G249 Dystonia, unspecified: Secondary | ICD-10-CM | POA: Diagnosis not present

## 2017-05-19 DIAGNOSIS — K219 Gastro-esophageal reflux disease without esophagitis: Secondary | ICD-10-CM | POA: Diagnosis not present

## 2017-05-19 DIAGNOSIS — I10 Essential (primary) hypertension: Secondary | ICD-10-CM

## 2017-05-19 DIAGNOSIS — J439 Emphysema, unspecified: Secondary | ICD-10-CM | POA: Diagnosis not present

## 2017-05-19 DIAGNOSIS — M542 Cervicalgia: Secondary | ICD-10-CM | POA: Diagnosis not present

## 2017-05-19 MED ORDER — AMLODIPINE BESYLATE 10 MG PO TABS: 10.0000 mg | ORAL_TABLET | Freq: Every day | ORAL | 5 refills | Status: DC

## 2017-05-19 MED ORDER — MONTELUKAST SODIUM 10 MG PO TABS: 10.0000 mg | ORAL_TABLET | Freq: Every day | ORAL | 5 refills | Status: DC

## 2017-05-19 MED ORDER — FLUTICASONE FUROATE-VILANTEROL 100-25 MCG/INH IN AEPB: 1.0000 | INHALATION_SPRAY | Freq: Every day | RESPIRATORY_TRACT | 5 refills | Status: DC

## 2017-05-19 MED ORDER — GABAPENTIN 600 MG PO TABS: 600.0000 mg | ORAL_TABLET | Freq: Four times a day (QID) | ORAL | 3 refills | Status: DC

## 2017-05-19 MED ORDER — TIOTROPIUM BROMIDE MONOHYDRATE 18 MCG IN CAPS: 18.0000 ug | ORAL_CAPSULE | Freq: Every day | RESPIRATORY_TRACT | 5 refills | Status: DC

## 2017-05-19 MED ORDER — LISINOPRIL 40 MG PO TABS: 40.0000 mg | ORAL_TABLET | Freq: Every day | ORAL | 5 refills | Status: DC

## 2017-05-19 MED ORDER — PREDNISONE 20 MG PO TABS: 40.0000 mg | ORAL_TABLET | Freq: Every day | ORAL | 0 refills | Status: AC

## 2017-05-19 MED ORDER — OMEPRAZOLE 20 MG PO TBEC: 1.0000 | DELAYED_RELEASE_TABLET | Freq: Every day | ORAL | 5 refills | Status: DC

## 2017-05-19 MED ORDER — ROPINIROLE HCL 2 MG PO TABS: 2.0000 mg | ORAL_TABLET | Freq: Two times a day (BID) | ORAL | 5 refills | Status: DC

## 2017-05-19 NOTE — Patient Instructions (Signed)
Dr. Meredeth IdeFleming [phone];  Call about the nebulizer machine -

## 2017-05-19 NOTE — Progress Notes (Signed)
Date:  05/19/2017   Name:  David BienenstockRonald E Stammen   DOB:  [birth year]   MRN:  [id number]   Chief Complaint: COPD (Wants to see about getting a nebulizer to help with breathing. ) and Hypertension Hypertension  This is a chronic problem. The problem is controlled. Associated symptoms include shortness of breath. Pertinent negatives include no chest pain, headaches or palpitations. Past treatments include ACE inhibitors and calcium channel blockers.  Gastroesophageal Reflux  He complains of coughing, heartburn and wheezing. He reports no abdominal pain or no chest pain. This is a chronic problem. The problem occurs frequently. Pertinent negatives include no fatigue. He has tried a PPI for the symptoms.   COPD - on Spiriva and Breo daily and using the albuterol as needed.  He has a spacer and needs to use it to get the albuterol.  He stays tight and short of breat most of the time.  He inquires about a nebulizer.  Neck pain - he reports that his neurosurgeon says he needs neck surgery.  I advised him to see Pulmonary before surgery to get his breathing optimized.   Review of Systems  Constitutional: Negative for chills, fatigue and fever.  Eyes: Negative for visual disturbance.  Respiratory: Positive for cough, chest tightness, shortness of breath and wheezing.   Cardiovascular: Negative for chest pain, palpitations and leg swelling.  Gastrointestinal: Positive for heartburn. Negative for abdominal pain.  Musculoskeletal: Negative for back pain.  Skin: Negative for color change and rash.  Neurological: Negative for dizziness and headaches.    Patient Active Problem List   Diagnosis Date Noted  . Pulmonary nodules 12/31/2015  . COPD (chronic obstructive pulmonary disease) (HCC) 12/27/2015  . Chronic cough 12/27/2015  . Allergic rhinitis 11/11/2014  . Bursitis of shoulder 11/11/2014  . Nerve root pain 11/11/2014  . Dyskinesia 11/11/2014  . Acid reflux 11/11/2014  . Calcium blood increased  11/11/2014  . Affective disorder (HCC) 11/11/2014  . Hypertension 12/25/2011  . Chronic back pain 12/25/2011    Prior to Admission medications   Medication Sig Start Date End Date Taking? Authorizing Provider  albuterol (PROVENTIL HFA;VENTOLIN HFA) 108 (90 Base) MCG/ACT inhaler Inhale 2 puffs into the lungs every 6 (six) hours as needed for wheezing or shortness of breath. 03/21/17  Yes Phineas SemenGoodman, Graydon, MD  amLODipine (NORVASC) 10 MG tablet Take 1 tablet (10 mg total) by mouth daily. 09/08/16  Yes Reubin MilanBerglund, Shaul Trautman H, MD  fexofenadine (ALLEGRA) 180 MG tablet Take 180 mg by mouth daily.   Yes [provider]  fluticasone furoate-vilanterol (BREO ELLIPTA) 100-25 MCG/INH AEPB Inhale 1 puff into the lungs daily. 09/16/16  Yes Reubin MilanBerglund, Godwin Tedesco H, MD  gabapentin (NEURONTIN) 600 MG tablet TAKE 1 TABLET(600 MG) BY MOUTH FOUR TIMES DAILY 12/01/16  Yes Reubin MilanBerglund, Trevante Tennell H, MD  lisinopril (PRINIVIL,ZESTRIL) 40 MG tablet TAKE 1 TABLET(40 MG) BY MOUTH DAILY 04/02/17  Yes Reubin MilanBerglund, Atiyana Welte H, MD  montelukast (SINGULAIR) 10 MG tablet TAKE 1 TABLET(10 MG) BY MOUTH AT BEDTIME 04/02/17  Yes Reubin MilanBerglund, Shamere Campas H, MD  Omeprazole 20 MG TBEC Take 1 tablet by mouth daily.   Yes [provider]      Yes Phineas SemenGoodman, Graydon, MD  rOPINIRole (REQUIP) 2 MG tablet TAKE 1 TABLET(2 MG) BY MOUTH TWICE DAILY 05/10/17  Yes Reubin MilanBerglund, Katara Griner H, MD  tiotropium (SPIRIVA HANDIHALER) 18 MCG inhalation capsule Place 1 capsule (18 mcg total) into inhaler and inhale daily. 09/08/16  Yes Reubin MilanBerglund, Riyan Gavina H, MD    Allergies  Allergen Reactions  . Penicillins Shortness Of Breath    Has patient had a PCN reaction causing immediate rash, facial/tongue/throat swelling, SOB or lightheadedness with hypotension: Yes Has patient had a PCN reaction causing severe rash involving mucus membranes or skin necrosis: No Has patient had a PCN reaction that required hospitalization No Has patient had a PCN reaction occurring within the last 10 years:  No If all of the above answers are "NO", then may proceed with Cephalosporin use.     Past Surgical History:  Procedure Laterality Date  . BILATERAL INGUINAL HERNIA REPAIR Bilateral 02/07/2014   [facility]irez[facility] Armando[facility]  . Deep brain stimulator battery change Left 04/21/2014   [facility]manStern, Joseph[facility]  . DEEP BRAIN STIMULATOR PLACEMENT    . INSERTION OF MESH Bilateral 02/07/2014   [facility]el Filleramirez, Armando[facility]  . Left chest-Change implantable pulse generator battery Left 09/11/2016   [facility]Stern, Joseph[facility]  . NASAL SINUS SURGERY    . SUBTHALAMIC STIMULATOR BATTERY REPLACEMENT N/A 12/09/2011   [facility]Stern, Joseph[facility]    Social History   Tobacco Use  . Smoking status: Never Smoker  . Smokeless tobacco: Current User    Types: Snuff  Substance Use Topics  . Alcohol use: No    Alcohol/week: 0.0 oz  . Drug use: No     Medication list has been reviewed and updated.  No flowsheet data found.  Physical Exam  Constitutional: He is oriented to person, place, and time. He appears well-developed. No distress.  HENT:  Head: Normocephalic and atraumatic.  Cardiovascular: Regular rhythm and normal heart sounds. Tachycardia present.  Pulmonary/Chest: Accessory muscle usage present. No respiratory distress. He has decreased breath sounds. He has wheezes.  Musculoskeletal: He exhibits no edema or tenderness.       Cervical back: He exhibits decreased range of motion and bony tenderness.  Neurological: He is alert and oriented to person, place, and time.  Skin: Skin is warm and dry. No rash noted.  Psychiatric: He has a normal mood and affect. His behavior is normal. Thought content normal.  Nursing note and vitals reviewed.   BP 136/86   Pulse 100   Ht 6' (1.829 m)   Wt 171 lb (77.6 kg)   SpO2 95%   BMI 23.19 kg/m   Assessment and Plan: 1. Essential hypertension controlled  2.  Pulmonary emphysema, unspecified emphysema type (HCC) Advanced with chronic sx Prednisone pulse today See Dr. Meredeth IdeFleming before surgery  3. Dyskinesia Stable, controlled with neurostimulator  4. Gastroesophageal reflux disease without esophagitis Resume omeprazole  5. Neck pain Planning surgery but needs pulmonary tune up before   Meds ordered this encounter  Medications  . tiotropium (SPIRIVA HANDIHALER) 18 MCG inhalation capsule    Sig: Place 1 capsule (18 mcg total) daily into inhaler and inhale.    Dispense:  30 capsule    Refill:  5  . rOPINIRole (REQUIP) 2 MG tablet    Sig: Take 1 tablet (2 mg total) 2 (two) times daily by mouth.    Dispense:  60 tablet    Refill:  5    Please instruct pt to call the office to schedule an appt for further refills.  We have not been able to contact him by phone.  . montelukast (SINGULAIR) 10 MG tablet    Sig: Take 1 tablet (10 mg total) at  bedtime by mouth.    Dispense:  30 tablet    Refill:  5  . lisinopril (PRINIVIL,ZESTRIL) 40 MG tablet    Sig: Take 1 tablet (40 mg total) daily by mouth.    Dispense:  30 tablet    Refill:  5  . gabapentin (NEURONTIN) 600 MG tablet    Sig: Take 1 tablet (600 mg total) QID by mouth.    Dispense:  120 tablet    Refill:  3  . fluticasone furoate-vilanterol (BREO ELLIPTA) 100-25 MCG/INH AEPB    Sig: Inhale 1 puff daily into the lungs.    Dispense:  28 each    Refill:  5  . amLODipine (NORVASC) 10 MG tablet    Sig: Take 1 tablet (10 mg total) daily by mouth.    Dispense:  30 tablet    Refill:  5  . Omeprazole 20 MG TBEC    Sig: Take 1 tablet (20 mg total) daily by mouth.    Dispense:  30 each    Refill:  5  . predniSONE (DELTASONE) 20 MG tablet    Sig: Take 2 tablets (40 mg total) daily with breakfast for 5 days by mouth.    Dispense:  10 tablet    Refill:  0    Partially dictated using Animal nutritionist. Any errors are unintentional.  Bari Edward, MD [facility]  05/19/2017

## 2017-06-19 ENCOUNTER — Telehealth: Payer: Self-pay

## 2017-06-19 NOTE — Telephone Encounter (Signed)
Called [facility] because they have sent Dr Judithann GravesBerglund two orders to get patient nebulizer supplies. Informed the company that Dr Judithann GravesBerglund has never prescribed nebulizer or supplies for this patient and she cannot send in supplies when she was not the original prescriber. I informed her we denied the orders. Patient can have face to face with PCP to discuss if believes he needs it. Spoke with "Shanda BumpsJessica".

## 2017-07-23 ENCOUNTER — Ambulatory Visit (INDEPENDENT_AMBULATORY_CARE_PROVIDER_SITE_OTHER): Payer: Medicare Other | Admitting: Internal Medicine

## 2017-07-23 ENCOUNTER — Other Ambulatory Visit: Payer: Self-pay | Admitting: Internal Medicine

## 2017-07-23 ENCOUNTER — Encounter: Payer: Self-pay | Admitting: Internal Medicine

## 2017-07-23 VITALS — BP 124/88 | HR 96 | Ht 72.0 in | Wt 158.0 lb

## 2017-07-23 DIAGNOSIS — J439 Emphysema, unspecified: Secondary | ICD-10-CM

## 2017-07-23 DIAGNOSIS — M26623 Arthralgia of bilateral temporomandibular joint: Secondary | ICD-10-CM

## 2017-07-23 DIAGNOSIS — M5412 Radiculopathy, cervical region: Secondary | ICD-10-CM | POA: Insufficient documentation

## 2017-07-23 MED ORDER — BACLOFEN 10 MG PO TABS: 10.0000 mg | ORAL_TABLET | Freq: Three times a day (TID) | ORAL | 0 refills | Status: DC

## 2017-07-23 MED ORDER — MELOXICAM 15 MG PO TABS: 15.0000 mg | ORAL_TABLET | Freq: Every day | ORAL | 1 refills | Status: DC

## 2017-07-23 NOTE — Progress Notes (Signed)
Date:  07/23/2017   Name:  David Rhodes   DOB:  Apr 14, 1956   MRN:  [id number]   Chief Complaint: Arm Pain (Right arm pain. Been bothering him for years. Comes and goes. This time its not going away. No strength in arm. Shoulder down into elbow hurts. ) and Jaw Pain (Popping and snapping in joints in jaw, The last time it popped its now extremely painful. Even with chewing food and eating. Last popped about a week ago. )  Arm Pain   Incident onset: off and on for years. There was no injury mechanism. The pain is present in the right shoulder and upper left arm. Associated symptoms include numbness. Pertinent negatives include no chest pain.  he was told by his neurosurgeon that he has cervical disc disease and needs to consider surgery.  Most of his sx are in hisupper right shoulder muscle.  Often it radiates down the right arm.  Jaw pain - on left side, hurts all the time and hard to open the mouth.  It usually pops and snaps but last week it became constant.  Now he can't yawn or chew.   COPD - stable chronic cough.  No discolored sputum.  Pt declines flu vaccine.  Review of Systems  Constitutional: Negative for chills, fatigue and fever.  HENT: Negative for ear pain, facial swelling and trouble swallowing.   Respiratory: Positive for cough and shortness of breath. Negative for chest tightness and wheezing.   Cardiovascular: Negative for chest pain and palpitations.  Gastrointestinal: Negative for abdominal pain.  Musculoskeletal: Positive for arthralgias, myalgias, neck pain and neck stiffness.  Neurological: Positive for numbness. Negative for dizziness, weakness and headaches.    Patient Active Problem List   Diagnosis Date Noted  . Neck pain 05/19/2017  . Pulmonary nodules 12/31/2015  . COPD (chronic obstructive pulmonary disease) (HCC) 12/27/2015  . Chronic cough 12/27/2015  . Allergic rhinitis 11/11/2014  . Bursitis of shoulder 11/11/2014  . Nerve root pain 11/11/2014    . Dyskinesia 11/11/2014  . Acid reflux 11/11/2014  . Calcium blood increased 11/11/2014  . Affective disorder (HCC) 11/11/2014  . Hypertension 12/25/2011  . Chronic back pain 12/25/2011    Prior to Admission medications   Medication Sig Start Date End Date Taking? Authorizing Provider  albuterol (PROVENTIL HFA;VENTOLIN HFA) 108 (90 Base) MCG/ACT inhaler Inhale 2 puffs into the lungs every 6 (six) hours as needed for wheezing or shortness of breath. 03/21/17  Yes Phineas Semen, MD  amLODipine (NORVASC) 10 MG tablet Take 1 tablet (10 mg total) daily by mouth. 05/19/17  Yes Reubin Milan, MD  fexofenadine (ALLEGRA) 180 MG tablet Take 180 mg by mouth daily.   Yes [provider]  fluticasone furoate-vilanterol (BREO ELLIPTA) 100-25 MCG/INH AEPB Inhale 1 puff daily into the lungs. 05/19/17  Yes Reubin Milan, MD  gabapentin (NEURONTIN) 600 MG tablet Take 1 tablet (600 mg total) QID by mouth. 05/19/17  Yes Reubin Milan, MD  lisinopril (PRINIVIL,ZESTRIL) 40 MG tablet Take 1 tablet (40 mg total) daily by mouth. 05/19/17  Yes Reubin Milan, MD  montelukast (SINGULAIR) 10 MG tablet Take 1 tablet (10 mg total) at bedtime by mouth. 05/19/17  Yes Reubin Milan, MD  Omeprazole 20 MG TBEC Take 1 tablet (20 mg total) daily by mouth. 05/19/17  Yes Reubin Milan, MD  rOPINIRole (REQUIP) 2 MG tablet Take 1 tablet (2 mg total) 2 (two) times daily by mouth. 05/19/17  Yes  Reubin MilanBerglund, Jamileth Putzier H, MD  tiotropium (SPIRIVA HANDIHALER) 18 MCG inhalation capsule Place 1 capsule (18 mcg total) daily into inhaler and inhale. 05/19/17  Yes Reubin MilanBerglund, Aleni Andrus H, MD    Allergies  Allergen Reactions  . Penicillins Shortness Of Breath    Has patient had a PCN reaction causing immediate rash, facial/tongue/throat swelling, SOB or lightheadedness with hypotension: Yes Has patient had a PCN reaction causing severe rash involving mucus membranes or skin necrosis: No Has patient had a PCN reaction  that required hospitalization No Has patient had a PCN reaction occurring within the last 10 years: No If all of the above answers are "NO", then may proceed with Cephalosporin use.     Past Surgical History:  Procedure Laterality Date  . DEEP BRAIN STIMULATOR PLACEMENT    . INGUINAL HERNIA REPAIR Bilateral 02/07/2014   Procedure:  BILATERAL INGUINAL HERNIA REPAIR;  Surgeon: Axel FillerArmando Ramirez, MD;  Location: [facility] OR;  Service: General;  Laterality: Bilateral;  . INSERTION OF MESH Bilateral 02/07/2014   Procedure: INSERTION OF MESH;  Surgeon: Axel FillerArmando Ramirez, MD;  Location: [facility] OR;  Service: General;  Laterality: Bilateral;  . NASAL SINUS SURGERY    . PULSE GENERATOR IMPLANT Left 09/11/2016   Procedure: Left chest-Change implantable pulse generator battery;  Surgeon: Maeola HarmanJoseph Stern, MD;  [facility];  Service: Neurosurgery;  Laterality: Left;  Left chest-Change implantable pulse generator battery  . SUBTHALAMIC STIMULATOR BATTERY REPLACEMENT  12/09/2011   Procedure: SUBTHALAMIC STIMULATOR BATTERY REPLACEMENT;  Surgeon: Maeola HarmanJoseph Stern, MD;  Location: [facility]UR[facility];  Service: Neurosurgery;  Laterality: N/A;   deep brain stimulator, implantable pulse generator change  . SUBTHALAMIC STIMULATOR BATTERY REPLACEMENT Left 04/21/2014   Procedure: Deep brain stimulator battery change;  Surgeon: Maeola HarmanJoseph Stern, MD;  Location: [facility];  Service: Neurosurgery;  Laterality: Left;  Deep brain stimulator battery change    Social History   Tobacco Use  . Smoking status: Never Smoker  . Smokeless tobacco: Current User    Types: Snuff  Substance Use Topics  . Alcohol use: No    Alcohol/week: 0.0 oz  . Drug use: No     Medication list has been reviewed and updated.  No flowsheet data found.  Physical Exam  Constitutional: He is oriented to person, place, and time. He appears well-developed. No distress.  HENT:  Head: Normocephalic and atraumatic.  Cardiovascular: Normal rate, regular rhythm and normal  heart sounds.  Pulmonary/Chest: Effort normal. No respiratory distress. He has decreased breath sounds. He has no wheezes. He has no rhonchi.  Musculoskeletal:       Right shoulder: Normal. He exhibits normal range of motion, no tenderness and no bony tenderness.       Cervical back: He exhibits decreased range of motion, tenderness and bony tenderness.       Arms: Neurological: He is alert and oriented to person, place, and time.  Skin: Skin is warm and dry. No rash noted.  Psychiatric: He has a normal mood and affect. His behavior is normal. Thought content normal.  Nursing note and vitals reviewed.   BP 124/88   Pulse 96   Ht 6' (1.829 m)   Wt 158 lb (71.7 kg)   SpO2 95%   BMI 21.43 kg/m   Assessment and Plan: 1. Cervical radiculopathy - Ambulatory referral to Neurosurgery  2. Bilateral temporomandibular joint pain Begin heat, muscle relaxants and nsaids - baclofen (LIORESAL) 10 MG tablet; Take 1 tablet (10 mg total) by mouth 3 (three) times daily.  Dispense: 90 each; Refill: 0 - meloxicam (MOBIC) 15 MG tablet; Take 1 tablet (15 mg total) by mouth daily.  Dispense: 30 tablet; Refill: 1  3. Pulmonary emphysema, unspecified emphysema type (HCC) Stable, unchanged   Meds ordered this encounter  Medications  . baclofen (LIORESAL) 10 MG tablet    Sig: Take 1 tablet (10 mg total) by mouth 3 (three) times daily.    Dispense:  90 each    Refill:  0  . meloxicam (MOBIC) 15 MG tablet    Sig: Take 1 tablet (15 mg total) by mouth daily.    Dispense:  30 tablet    Refill:  1    Partially dictated using Animal nutritionist. Any errors are unintentional.  Bari Edward, MD [facility]  07/23/2017

## 2017-10-11 ENCOUNTER — Inpatient Hospital Stay
Admission: EM | Admit: 2017-10-11 | Discharge: 2017-10-12 | DRG: 190 | Disposition: A | Discharge status: Home / Self Care | Payer: Medicare Other | Attending: Internal Medicine | Admitting: Internal Medicine

## 2017-10-11 ENCOUNTER — Other Ambulatory Visit: Payer: Self-pay

## 2017-10-11 ENCOUNTER — Emergency Department: Payer: Medicare Other

## 2017-10-11 ENCOUNTER — Encounter: Payer: Self-pay | Admitting: Emergency Medicine

## 2017-10-11 DIAGNOSIS — J9601 Acute respiratory failure with hypoxia: Secondary | ICD-10-CM | POA: Diagnosis present

## 2017-10-11 DIAGNOSIS — J440 Chronic obstructive pulmonary disease with acute lower respiratory infection: Secondary | ICD-10-CM | POA: Diagnosis present

## 2017-10-11 DIAGNOSIS — I10 Essential (primary) hypertension: Secondary | ICD-10-CM | POA: Diagnosis not present

## 2017-10-11 DIAGNOSIS — J302 Other seasonal allergic rhinitis: Secondary | ICD-10-CM | POA: Diagnosis present

## 2017-10-11 DIAGNOSIS — Z79899 Other long term (current) drug therapy: Secondary | ICD-10-CM | POA: Diagnosis not present

## 2017-10-11 DIAGNOSIS — Z880 Allergy status to penicillin: Secondary | ICD-10-CM

## 2017-10-11 DIAGNOSIS — J9600 Acute respiratory failure, unspecified whether with hypoxia or hypercapnia: Secondary | ICD-10-CM | POA: Diagnosis present

## 2017-10-11 DIAGNOSIS — J441 Chronic obstructive pulmonary disease with (acute) exacerbation: Principal | ICD-10-CM | POA: Diagnosis present

## 2017-10-11 DIAGNOSIS — F17290 Nicotine dependence, other tobacco product, uncomplicated: Secondary | ICD-10-CM | POA: Diagnosis present

## 2017-10-11 DIAGNOSIS — J45901 Unspecified asthma with (acute) exacerbation: Secondary | ICD-10-CM | POA: Diagnosis not present

## 2017-10-11 DIAGNOSIS — J209 Acute bronchitis, unspecified: Secondary | ICD-10-CM | POA: Diagnosis present

## 2017-10-11 DIAGNOSIS — Z9689 Presence of other specified functional implants: Secondary | ICD-10-CM | POA: Diagnosis not present

## 2017-10-11 LAB — BASIC METABOLIC PANEL
ANION GAP: 6 (ref 5–15)
BUN: 12 mg/dL (ref 6–20)
CALCIUM: 9.8 mg/dL (ref 8.9–10.3)
CO2: 26 mmol/L (ref 22–32)
Chloride: 102 mmol/L (ref 101–111)
Creatinine, Ser: 1 mg/dL (ref 0.61–1.24)
GFR calc Af Amer: >60 mL/min (ref >60)
GLUCOSE: 106 mg/dL — AB (ref 65–99)
POTASSIUM: 3.7 mmol/L (ref 3.5–5.1)
Sodium: 134 mmol/L — ABNORMAL LOW (ref 135–145)

## 2017-10-11 LAB — CBC
HEMATOCRIT: 46 % (ref 40.0–52.0)
HEMOGLOBIN: 15.5 g/dL (ref 13.0–18.0)
MCH: 29.7 pg (ref 26.0–34.0)
MCHC: 33.6 g/dL (ref 32.0–36.0)
MCV: 88.2 fL (ref 80.0–100.0)
Platelets: 248 10*3/uL (ref 150–440)
RBC: 5.21 MIL/uL (ref 4.40–5.90)
RDW: 14.4 % (ref 11.5–14.5)
WBC: 20.9 10*3/uL — ABNORMAL HIGH (ref 3.8–10.6)

## 2017-10-11 MED ORDER — MAGNESIUM SULFATE 2 GM/50ML IV SOLN
2.0000 g | Freq: Once | INTRAVENOUS | Status: AC
Start: 2017-10-11 — End: 2017-10-11
  Administered 2017-10-11: 2 g via INTRAVENOUS
  Filled 2017-10-11: qty 50

## 2017-10-11 MED ORDER — LORATADINE 10 MG PO TABS
10.0000 mg | ORAL_TABLET | Freq: Every day | ORAL | Status: DC
  Administered 2017-10-12: 10 mg via ORAL
  Filled 2017-10-11: qty 1

## 2017-10-11 MED ORDER — METHYLPREDNISOLONE SODIUM SUCC 125 MG IJ SOLR
80.0000 mg | Freq: Two times a day (BID) | INTRAMUSCULAR | Status: DC
  Administered 2017-10-11: 80 mg via INTRAVENOUS
  Filled 2017-10-11 (×2): qty 2

## 2017-10-11 MED ORDER — IPRATROPIUM-ALBUTEROL 0.5-2.5 (3) MG/3ML IN SOLN
3.0000 mL | Freq: Four times a day (QID) | RESPIRATORY_TRACT | Status: DC
  Administered 2017-10-12: 3 mL via RESPIRATORY_TRACT
  Filled 2017-10-11 (×2): qty 3

## 2017-10-11 MED ORDER — METHYLPREDNISOLONE SODIUM SUCC 125 MG IJ SOLR
125.0000 mg | Freq: Once | INTRAMUSCULAR | Status: AC
  Administered 2017-10-11: 125 mg via INTRAVENOUS
  Filled 2017-10-11: qty 2

## 2017-10-11 MED ORDER — HYDROCODONE-ACETAMINOPHEN 5-325 MG PO TABS: 1.0000 | ORAL_TABLET | ORAL | Status: DC | PRN

## 2017-10-11 MED ORDER — AMLODIPINE BESYLATE 10 MG PO TABS
10.0000 mg | ORAL_TABLET | Freq: Every day | ORAL | Status: DC
  Administered 2017-10-12: 10 mg via ORAL
  Filled 2017-10-11: qty 1

## 2017-10-11 MED ORDER — HEPARIN SODIUM (PORCINE) 5000 UNIT/ML IJ SOLN
5000.0000 [IU] | Freq: Three times a day (TID) | INTRAMUSCULAR | Status: DC
  Administered 2017-10-11 – 2017-10-12 (×2): 5000 [IU] via SUBCUTANEOUS
  Filled 2017-10-11 (×2): qty 1

## 2017-10-11 MED ORDER — IPRATROPIUM-ALBUTEROL 0.5-2.5 (3) MG/3ML IN SOLN
3.0000 mL | Freq: Once | RESPIRATORY_TRACT | Status: AC
  Administered 2017-10-11: 3 mL via RESPIRATORY_TRACT
  Filled 2017-10-11: qty 3

## 2017-10-11 MED ORDER — ONDANSETRON HCL 4 MG PO TABS: 4.0000 mg | ORAL_TABLET | Freq: Four times a day (QID) | ORAL | Status: DC | PRN

## 2017-10-11 MED ORDER — METHYLPREDNISOLONE SODIUM SUCC 125 MG IJ SOLR
INTRAMUSCULAR | Status: AC
  Filled 2017-10-11: qty 2

## 2017-10-11 MED ORDER — MONTELUKAST SODIUM 10 MG PO TABS
10.0000 mg | ORAL_TABLET | Freq: Every day | ORAL | Status: DC
  Administered 2017-10-11: 10 mg via ORAL
  Filled 2017-10-11: qty 1

## 2017-10-11 MED ORDER — BISACODYL 5 MG PO TBEC: 5.0000 mg | DELAYED_RELEASE_TABLET | Freq: Every day | ORAL | Status: DC | PRN

## 2017-10-11 MED ORDER — ACETAMINOPHEN 650 MG RE SUPP: 650.0000 mg | Freq: Four times a day (QID) | RECTAL | Status: DC | PRN

## 2017-10-11 MED ORDER — ACETAMINOPHEN 325 MG PO TABS: 650.0000 mg | ORAL_TABLET | Freq: Four times a day (QID) | ORAL | Status: DC | PRN

## 2017-10-11 MED ORDER — ONDANSETRON HCL 4 MG/2ML IJ SOLN
4.0000 mg | Freq: Four times a day (QID) | INTRAMUSCULAR | Status: DC | PRN
Start: 2017-10-11 — End: 2017-10-12

## 2017-10-11 MED ORDER — DOCUSATE SODIUM 100 MG PO CAPS
100.0000 mg | ORAL_CAPSULE | Freq: Two times a day (BID) | ORAL | Status: DC
  Administered 2017-10-12: 100 mg via ORAL
  Filled 2017-10-11 (×2): qty 1

## 2017-10-11 NOTE — H&P (Signed)
[facility] - [place] at [facility]   PATIENT NAME: David Rhodes    MR#:  [id number]  DATE OF BIRTH:  May 25, 1956  DATE OF ADMISSION:  10/11/2017  PRIMARY CARE PHYSICIAN: Reubin Milan, MD   REQUESTING/REFERRING PHYSICIAN:   CHIEF COMPLAINT:   Chief Complaint  Patient presents with  . Shortness of Breath    HISTORY OF PRESENT ILLNESS: David Rhodes  is a 62 y.o. male with a known history of hayfever, asthma, COPD, hypertension, neuromuscular disorder.  Patient denies smoking. Patient presented to emergency room for acute onset of shortness of breath, chest tightness, wheezing and productive cough going on for the past 2-3 days, gradually getting worse.  He was not able to try any medication for his symptoms, due to the fact that he cannot afford to pay for his medications.  He denies any fever or chills at home.  His oxygen saturation at the arrival to emergency room was 89% on room air.  Patient's shortness of breath and chest tightness improved with DuoNebs and steroids in the emergency room, but his oxygen saturation remained low around 89%, requiring 2 L oxygen per nasal cannula. Blood test done emergency room, are remarkable for elevated WBC at 20,000.  Chest x-ray, reviewed by myself, shows acute bronchitis changes and hyperinflation. Patient is admitted for further evaluation and treatment.   PAST MEDICAL HISTORY:   Past Medical History:  Diagnosis Date  . Allergy    Hay fever  . Anxiety   . Asthma   . Chronic pain due to trauma   . COPD (chronic obstructive pulmonary disease) (HCC)   . Depression   . GERD (gastroesophageal reflux disease)   . Headache(784.0)   . Hypertension   . Inguinal hernia    bilateral  . Neuromuscular disorder (HCC)    hx of tremors  . Pneumonia    many years ago  . Shortness of breath    when allergies flare up  . Urine incontinence   . Wears glasses     PAST SURGICAL HISTORY:  Past Surgical History:   Procedure Laterality Date  . DEEP BRAIN STIMULATOR PLACEMENT    . INGUINAL HERNIA REPAIR Bilateral 02/07/2014   Procedure:  BILATERAL INGUINAL HERNIA REPAIR;  Surgeon: Axel Filler, MD;  Location: [facility] OR;  Service: General;  Laterality: Bilateral;  . INSERTION OF MESH Bilateral 02/07/2014   Procedure: INSERTION OF MESH;  Surgeon: Axel Filler, MD;  Location: [facility] OR;  Service: General;  Laterality: Bilateral;  . NASAL SINUS SURGERY    . PULSE GENERATOR IMPLANT Left 09/11/2016   Procedure: Left chest-Change implantable pulse generator battery;  Surgeon: Maeola Harman, MD;  Location[facility] OR;  Service: Neurosurgery;  Laterality: Left;  Left chest-Change implantable pulse generator battery  . SUBTHALAMIC STIMULATOR BATTERY REPLACEMENT  12/09/2011   Procedure: SUBTHALAMIC STIMULATOR BATTERY REPLACEMENT;  Surgeon: Maeola Harman, MD;  Location: [facility];  Service: Neurosurgery;  Laterality: N/A;   deep brain stimulator, implantable pulse generator change  . SUBTHALAMIC STIMULATOR BATTERY REPLACEMENT Left 04/21/2014   Procedure: Deep brain stimulator battery change;  Surgeon: Maeola Harman, MD;  Location: [facility];  Service: Neurosurgery;  Laterality: Left;  Deep brain stimulator battery change    SOCIAL HISTORY:  Social History   Tobacco Use  . Smoking status: Never Smoker  . Smokeless tobacco: Current User    Types: Snuff  Substance Use Topics  . Alcohol use: No    Alcohol/week: 0.0 oz    FAMILY  HISTORY:  Family History  Problem Relation Age of Onset  . Myoclonus Mother   . Breast cancer Mother   . Prostate cancer Father   . Heart disease Paternal Grandfather   . Myoclonus Maternal Uncle     DRUG ALLERGIES:  Allergies  Allergen Reactions  . Penicillins Shortness Of Breath    Has patient had a PCN reaction causing immediate rash, facial/tongue/throat swelling, SOB or lightheadedness with hypotension: Yes Has patient had a PCN reaction causing severe rash involving mucus  membranes or skin necrosis: No Has patient had a PCN reaction that required hospitalization No Has patient had a PCN reaction occurring within the last 10 years: No If all of the above answers are "NO", then may proceed with Cephalosporin use.     REVIEW OF SYSTEMS:   CONSTITUTIONAL: No fever, but patient complains of fatigue and generalized weakness.  EYES: No vision changes.  EARS, NOSE, AND THROAT: No tinnitus or ear pain.  RESPIRATORY: Positive for productive cough, shortness of breath, wheezing; no hemoptysis.  CARDIOVASCULAR: No chest pain, orthopnea, edema.  GASTROINTESTINAL: No nausea, vomiting, diarrhea or abdominal pain.  GENITOURINARY: No dysuria, hematuria.  ENDOCRINE: No polyuria, nocturia,  HEMATOLOGY: No bleeding SKIN: No rash or lesion. MUSCULOSKELETAL: No joint pain or arthritis.   NEUROLOGIC: No focal weakness.  PSYCHIATRY: No anxiety or depression.   MEDICATIONS AT HOME:  Prior to Admission medications   Medication Sig Start Date End Date Taking? Authorizing Provider  albuterol (PROVENTIL HFA;VENTOLIN HFA) 108 (90 Base) MCG/ACT inhaler Inhale 2 puffs into the lungs every 6 (six) hours as needed for wheezing or shortness of breath. Patient not taking: Reported on 10/11/2017 03/21/17   Phineas Semen, MD  amLODipine (NORVASC) 10 MG tablet Take 1 tablet (10 mg total) daily by mouth. Patient not taking: Reported on 10/11/2017 05/19/17   Reubin Milan, MD  baclofen (LIORESAL) 10 MG tablet TAKE 1 TABLET(10 MG) BY MOUTH THREE TIMES DAILY Patient not taking: Reported on 10/11/2017 07/23/17   Reubin Milan, MD  fexofenadine (ALLEGRA) 180 MG tablet Take 180 mg by mouth daily.    [provider]  fluticasone furoate-vilanterol (BREO ELLIPTA) 100-25 MCG/INH AEPB Inhale 1 puff daily into the lungs. Patient not taking: Reported on 10/11/2017 05/19/17   Reubin Milan, MD  gabapentin (NEURONTIN) 600 MG tablet Take 1 tablet (600 mg total) QID by mouth. Patient  not taking: Reported on 10/11/2017 05/19/17   Reubin Milan, MD  lisinopril (PRINIVIL,ZESTRIL) 40 MG tablet Take 1 tablet (40 mg total) daily by mouth. Patient not taking: Reported on 10/11/2017 05/19/17   Reubin Milan, MD  meloxicam (MOBIC) 15 MG tablet TAKE 1 TABLET(15 MG) BY MOUTH DAILY Patient not taking: Reported on 10/11/2017 07/23/17   Reubin Milan, MD  montelukast (SINGULAIR) 10 MG tablet Take 1 tablet (10 mg total) at bedtime by mouth. Patient not taking: Reported on 10/11/2017 05/19/17   Reubin Milan, MD  Omeprazole 20 MG TBEC Take 1 tablet (20 mg total) daily by mouth. Patient not taking: Reported on 10/11/2017 05/19/17   Reubin Milan, MD  rOPINIRole (REQUIP) 2 MG tablet Take 1 tablet (2 mg total) 2 (two) times daily by mouth. Patient not taking: Reported on 10/11/2017 05/19/17   Reubin Milan, MD  tiotropium (SPIRIVA HANDIHALER) 18 MCG inhalation capsule Place 1 capsule (18 mcg total) daily into inhaler and inhale. Patient not taking: Reported on 10/11/2017 05/19/17   Reubin Milan, MD  PHYSICAL EXAMINATION:   VITAL SIGNS: Blood pressure 117/79, pulse (!) 107, temperature 98 F (36.7 C), temperature source Oral, resp. rate (!) 21, height 6' (1.829 m), weight 80.7 kg (178 lb), SpO2 94 %.  GENERAL:  62 y.o.-year-old patient lying in the bed, currently with no acute distress, status post duo nebs and steroids.  EYES: Pupils equal, round, reactive to light and accommodation. No scleral icterus.  HEENT: Head atraumatic, normocephalic. Oropharynx and nasopharynx clear.  NECK:  Supple, no jugular venous distention. No thyroid enlargement, no tenderness.  LUNGS: Reduced breath sounds and scattered wheezing noted bilaterally. No use of accessory muscles of respiration.  CARDIOVASCULAR: S1, S2 normal. No S3/S4.  ABDOMEN: Soft, nontender, nondistended. Bowel sounds present. No organomegaly or mass.  EXTREMITIES: No pedal edema, cyanosis, or clubbing.   NEUROLOGIC: No focal weakness. PSYCHIATRIC: The patient is alert and oriented x 3.  SKIN: No obvious rash, lesion, or ulcer.   LABORATORY PANEL:   CBC Recent Labs  Lab 10/11/17 1823  WBC 20.9*  HGB 15.5  HCT 46.0  PLT 248  MCV 88.2  MCH 29.7  MCHC 33.6  RDW 14.4   ------------------------------------------------------------------------------------------------------------------  Chemistries  Recent Labs  Lab 10/11/17 1823  NA 134*  K 3.7  CL 102  CO2 26  GLUCOSE 106*  BUN 12  CREATININE 1.00  CALCIUM 9.8   ------------------------------------------------------------------------------------------------------------------ estimated creatinine clearance is 84.1 mL/min (by C-G formula based on SCr of 1 mg/dL). ------------------------------------------------------------------------------------------------------------------ No results for input(s): TSH, T4TOTAL, T3FREE, THYROIDAB in the last 72 hours.  Invalid input(s): FREET3   Coagulation profile No results for input(s): INR, PROTIME in the last 168 hours. ------------------------------------------------------------------------------------------------------------------- No results for input(s): DDIMER in the last 72 hours. -------------------------------------------------------------------------------------------------------------------  Cardiac Enzymes No results for input(s): CKMB, TROPONINI, MYOGLOBIN in the last 168 hours.  Invalid input(s): CK ------------------------------------------------------------------------------------------------------------------ Invalid input(s): POCBNP  ---------------------------------------------------------------------------------------------------------------  Urinalysis    Component Value Date/Time   COLORURINE Yellow 07/20/2011 0241   APPEARANCEUR Clear 07/20/2011 0241   LABSPEC 1.011 07/20/2011 0241   PHURINE 6.0 07/20/2011 0241   GLUCOSEU Negative 07/20/2011 0241    HGBUR 1+ 07/20/2011 0241   BILIRUBINUR neg 12/28/2014 0936   BILIRUBINUR Negative 07/20/2011 0241   KETONESUR Negative 07/20/2011 0241   PROTEINUR neg 12/28/2014 0936   PROTEINUR Negative 07/20/2011 0241   UROBILINOGEN 0.2 12/28/2014 0936   NITRITE neg 12/28/2014 0936   NITRITE Negative 07/20/2011 0241   LEUKOCYTESUR Negative 12/28/2014 0936   LEUKOCYTESUR Negative 07/20/2011 0241     RADIOLOGY: Dg Chest 2 View  Result Date: 10/11/2017 CLINICAL DATA:  Shortness of breath EXAM: CHEST - 2 VIEW COMPARISON:  03/21/2017 FINDINGS: Ascending stimulator device. Mild hyperinflation with bronchitic changes. No acute airspace disease or pleural effusion. Normal cardiomediastinal silhouette. No pneumothorax. IMPRESSION: Hyperinflation with chronic bronchitic changes. No acute airspace disease Electronically Signed   By: Jasmine Pang M.D.   On: 10/11/2017 18:56    EKG: Orders placed or performed during the hospital encounter of 10/11/17  . ED EKG  . ED EKG  . EKG 12-Lead  . EKG 12-Lead    IMPRESSION AND PLAN:  1. Acute hypoxic respiratory failure, likely secondary to COPD/asthma exacerbation and acute bronchitis.  We will start steroids, nebulizer treatments, oxygen therapy and antibiotics, Levaquin IV. 2.  Acute COPD exacerbation, secondary to acute bronchitis.  See treatment as above under #1. 3.  Acute bronchitis, see treatment as above under #1.  4.  Hypertension, stable, continue home medications.  Per patient, he needs financial  assistance with his medications.   All the records are reviewed and case discussed with ED provider. Management plans discussed with the patient, family and they are in agreement.  CODE STATUS: FULL    TOTAL TIME TAKING CARE OF THIS PATIENT: 40 minutes.    Cammy CopaAngela Tommie Dejoseph M.D on 10/11/2017 at 10:18 PM  Between 7am to 6pm - Pager - [phone]  After 6pm go to [email or web address] - password [email or web address] [facility]  [facility]  Office   [phone]  CC: Primary care physician; Reubin MilanBerglund, Laura H, MD

## 2017-10-11 NOTE — ED Triage Notes (Signed)
Pt comes into the ED via POV c/o shortness of breath, coughing, and sneezing.  Patient has COPD.  Patient denies wearing any O2 at home.  Patient states that the shortness of breath became more than his normal this morning.  Patient denies any chest pain, dizziness, N/V.

## 2017-10-11 NOTE — ED Notes (Signed)
Override for solumedrol d/t wasting the first dose.

## 2017-10-11 NOTE — ED Provider Notes (Signed)
[facility] Emergency Department Provider Note   ____________________________________________    I have reviewed the triage vital signs and the nursing notes.   HISTORY  Chief Complaint Shortness of Breath     HPI David Rhodes is a 62 y.o. male with a history of COPD as well as a deep brain stimulator presents with complaints of shortness of breath.  Worsening tightness in the chest and difficulty breathing over the last 2-3 days.  He does have a history of COPD.  He denies smoking history.  Denies fevers or chills.  Positive cough, productive.  No nausea or vomiting or diaphoresis.  No recent travel.  No calf pain or swelling. has not taken anything for this   Past Medical History:  Diagnosis Date  . Allergy    Hay fever  . Anxiety   . Asthma   . Chronic pain due to trauma   . COPD (chronic obstructive pulmonary disease) (HCC)   . Depression   . GERD (gastroesophageal reflux disease)   . Headache(784.0)   . Hypertension   . Inguinal hernia    bilateral  . Neuromuscular disorder (HCC)    hx of tremors  . Pneumonia    many years ago  . Shortness of breath    when allergies flare up  . Urine incontinence   . Wears glasses     Patient Active Problem List   Diagnosis Date Noted  . Cervical radiculopathy 07/23/2017  . Bilateral temporomandibular joint pain 07/23/2017  . Neck pain 05/19/2017  . Pulmonary nodules 12/31/2015  . COPD (chronic obstructive pulmonary disease) (HCC) 12/27/2015  . Chronic cough 12/27/2015  . Allergic rhinitis 11/11/2014  . Bursitis of shoulder 11/11/2014  . Nerve root pain 11/11/2014  . Dyskinesia 11/11/2014  . Acid reflux 11/11/2014  . Calcium blood increased 11/11/2014  . Affective disorder (HCC) 11/11/2014  . Hypertension 12/25/2011  . Chronic back pain 12/25/2011    Past Surgical History:  Procedure Laterality Date  . DEEP BRAIN STIMULATOR PLACEMENT    . INGUINAL HERNIA REPAIR Bilateral  02/07/2014   Procedure:  BILATERAL INGUINAL HERNIA REPAIR;  Surgeon: Axel FillerArmando Ramirez, MD;  Location: [facility] OR;  Service: General;  Laterality: Bilateral;  . INSERTION OF MESH Bilateral 02/07/2014   Procedure: INSERTION OF MESH;  Surgeon: Axel FillerArmando Ramirez, MD;  Location: [facility] OR;  Service: General;  Laterality: Bilateral;  . NASAL SINUS SURGERY    . PULSE GENERATOR IMPLANT Left 09/11/2016   Procedure: Left chest-Change implantable pulse generator battery;  Surgeon: Maeola HarmanJoseph Stern, MD;  [facility];  Service: Neurosurgery;  Laterality: Left;  Left chest-Change implantable pulse generator battery  . SUBTHALAMIC STIMULATOR BATTERY REPLACEMENT  12/09/2011   Procedure: SUBTHALAMIC STIMULATOR BATTERY REPLACEMENT;  Surgeon: Maeola HarmanJoseph Stern, MD;  Location: [facility];  Service: Neurosurgery;  Laterality: N/A;   deep brain stimulator, implantable pulse generator change  . SUBTHALAMIC STIMULATOR BATTERY REPLACEMENT Left 04/21/2014   Procedure: Deep brain stimulator battery change;  Surgeon: Maeola HarmanJoseph Stern, MD;  Location: [facility];  Service: Neurosurgery;  Laterality: Left;  Deep brain stimulator battery change    Prior to Admission medications   Medication Sig Start Date End Date Taking? Authorizing Provider  albuterol (PROVENTIL HFA;VENTOLIN HFA) 108 (90 Base) MCG/ACT inhaler Inhale 2 puffs into the lungs every 6 (six) hours as needed for wheezing or shortness of breath. 03/21/17   Phineas SemenGoodman, Graydon, MD  amLODipine (NORVASC) 10 MG tablet Take 1 tablet (10 mg total) daily by mouth.  05/19/17   Reubin Milan, MD  baclofen (LIORESAL) 10 MG tablet TAKE 1 TABLET(10 MG) BY MOUTH THREE TIMES DAILY 07/23/17   Reubin Milan, MD  fexofenadine (ALLEGRA) 180 MG tablet Take 180 mg by mouth daily.    [provider]  fluticasone furoate-vilanterol (BREO ELLIPTA) 100-25 MCG/INH AEPB Inhale 1 puff daily into the lungs. 05/19/17   Reubin Milan, MD  gabapentin (NEURONTIN) 600 MG tablet Take 1 tablet (600 mg  total) QID by mouth. 05/19/17   Reubin Milan, MD  lisinopril (PRINIVIL,ZESTRIL) 40 MG tablet Take 1 tablet (40 mg total) daily by mouth. 05/19/17   Reubin Milan, MD  meloxicam (MOBIC) 15 MG tablet TAKE 1 TABLET(15 MG) BY MOUTH DAILY 07/23/17   Reubin Milan, MD  montelukast (SINGULAIR) 10 MG tablet Take 1 tablet (10 mg total) at bedtime by mouth. 05/19/17   Reubin Milan, MD  Omeprazole 20 MG TBEC Take 1 tablet (20 mg total) daily by mouth. 05/19/17   Reubin Milan, MD  rOPINIRole (REQUIP) 2 MG tablet Take 1 tablet (2 mg total) 2 (two) times daily by mouth. 05/19/17   Reubin Milan, MD  tiotropium (SPIRIVA HANDIHALER) 18 MCG inhalation capsule Place 1 capsule (18 mcg total) daily into inhaler and inhale. 05/19/17   Reubin Milan, MD     Allergies Penicillins  Family History  Problem Relation Age of Onset  . Myoclonus Mother   . Breast cancer Mother   . Prostate cancer Father   . Heart disease Paternal Grandfather   . Myoclonus Maternal Uncle     Social History Social History   Tobacco Use  . Smoking status: Never Smoker  . Smokeless tobacco: Current User    Types: Snuff  Substance Use Topics  . Alcohol use: No    Alcohol/week: 0.0 oz  . Drug use: No    Review of Systems  Constitutional: No fever/chills Eyes: No visual changes.  ENT: No sore throat. Cardiovascular: Chest tightness Respiratory: As above. Gastrointestinal: No abdominal pain.  No nausea, no vomiting.   Genitourinary: Negative for dysuria. Musculoskeletal: Negative for calf pain Skin: Negative for rash. Neurological: Negative for headaches   ____________________________________________   PHYSICAL EXAM:  VITAL SIGNS: ED Triage Vitals  Enc Vitals Group     BP 10/11/17 1822 (!) 140/93     Pulse Rate 10/11/17 1822 (!) 117     Resp 10/11/17 1822 20     Temp 10/11/17 1822 98 F (36.7 C)     Temp Source 10/11/17 1822 Oral     SpO2 10/11/17 1822 97 %     Weight 10/11/17  1822 80.7 kg (178 lb)     Height 10/11/17 1822 1.829 m (6')     Head Circumference --      Peak Flow --      Pain Score 10/11/17 1829 0     Pain Loc --      Pain Edu? --      Excl. in GC? --     Constitutional: Alert and oriented.  Pleasant and interactive Eyes: Conjunctivae are normal.   Nose: No congestion/rhinnorhea. Mouth/Throat: Mucous membranes are moist.   Neck:  Painless ROM Cardiovascular: Tachycardia, regular rhythm. Grossly normal heart sounds.  Good peripheral circulation. Respiratory: Increased respiratory effort, poor airflow, diffuse wheezing Gastrointestinal: Soft and nontender. No distention.   Genitourinary: deferred Musculoskeletal: No lower extremity tenderness nor edema.  Warm and well perfused Neurologic:  Normal speech and language. No  gross focal neurologic deficits are appreciated.  Skin:  Skin is warm, dry and intact. No rash noted.   ____________________________________________   LABS (all labs ordered are listed, but only abnormal results are displayed)  Labs Reviewed  BASIC METABOLIC PANEL - Abnormal; Notable for the following components:      Result Value   Sodium 134 (*)    Glucose, Bld 106 (*)    All other components within normal limits  CBC - Abnormal; Notable for the following components:   WBC 20.9 (*)    All other components within normal limits   ____________________________________________  EKG  ED ECG REPORT I, Jene Every, the attending physician, personally viewed and interpreted this ECG.  Date: 10/11/2017  Rhythm: Sinus tachycardia QRS Axis: normal Intervals: normal ST/T Wave abnormalities: normal Narrative Interpretation: no evidence of acute ischemia  ____________________________________________  RADIOLOGY  Chest x-ray negative for pneumonia ____________________________________________   PROCEDURES  Procedure(s) performed: No  Procedures   Critical Care performed:  No ____________________________________________   INITIAL IMPRESSION / ASSESSMENT AND PLAN / ED COURSE  Pertinent labs & imaging results that were available during my care of the patient were reviewed by me and considered in my medical decision making (see chart for details).  Patient presents with shortness of breath, chest tightness.  Tachycardia on exam.  Significant wheezing poor airflow consistent with COPD exacerbation.  Will treat with IV magnesium, Solu-Medrol, duo nebs and monitor carefully  ----------------------------------------- 8:13 PM on 10/11/2017 -----------------------------------------  Patient with significant improvement on exam, he reports he feels significantly better as well, will do 1 more DuoNeb and reevaluate  ----------------------------------------- 8:43 PM on 10/11/2017 -----------------------------------------  Patient remains hypoxic after treatment, discussed with hospitalist for admission    ____________________________________________   FINAL CLINICAL IMPRESSION(S) / ED DIAGNOSES  Final diagnoses:  COPD exacerbation (HCC)        Note:  This document was prepared using Dragon voice recognition software and may include unintentional dictation errors.    Jene Every, MD 10/11/17 2043

## 2017-10-12 DIAGNOSIS — J441 Chronic obstructive pulmonary disease with (acute) exacerbation: Secondary | ICD-10-CM | POA: Diagnosis not present

## 2017-10-12 LAB — BASIC METABOLIC PANEL
Anion gap: 6 (ref 5–15)
BUN: 16 mg/dL (ref 6–20)
CALCIUM: 9.8 mg/dL (ref 8.9–10.3)
CO2: 26 mmol/L (ref 22–32)
CREATININE: 1.01 mg/dL (ref 0.61–1.24)
Chloride: 102 mmol/L (ref 101–111)
GFR calc non Af Amer: >60 mL/min (ref >60)
Glucose, Bld: 178 mg/dL — ABNORMAL HIGH (ref 65–99)
Potassium: 4 mmol/L (ref 3.5–5.1)
Sodium: 134 mmol/L — ABNORMAL LOW (ref 135–145)

## 2017-10-12 LAB — CBC
HCT: 43.3 % (ref 40.0–52.0)
Hemoglobin: 14.5 g/dL (ref 13.0–18.0)
MCH: 29.9 pg (ref 26.0–34.0)
MCHC: 33.6 g/dL (ref 32.0–36.0)
MCV: 89 fL (ref 80.0–100.0)
PLATELETS: 195 10*3/uL (ref 150–440)
RBC: 4.87 MIL/uL (ref 4.40–5.90)
RDW: 14.2 % (ref 11.5–14.5)
WBC: 16 10*3/uL — ABNORMAL HIGH (ref 3.8–10.6)

## 2017-10-12 LAB — GLUCOSE, CAPILLARY: GLUCOSE-CAPILLARY: 155 mg/dL — AB (ref 65–99)

## 2017-10-12 MED ORDER — LEVOFLOXACIN IN D5W 500 MG/100ML IV SOLN
500.0000 mg | Freq: Once | INTRAVENOUS | Status: AC
  Administered 2017-10-12: 500 mg via INTRAVENOUS
  Filled 2017-10-12: qty 100

## 2017-10-12 MED ORDER — DOXYCYCLINE HYCLATE 100 MG PO TABS: 100.0000 mg | ORAL_TABLET | Freq: Two times a day (BID) | ORAL | 0 refills | Status: DC

## 2017-10-12 MED ORDER — SODIUM CHLORIDE 0.9 % IV SOLN: 1.0000 g | INTRAVENOUS | Status: DC

## 2017-10-12 MED ORDER — LORATADINE 10 MG PO TABS: 10.0000 mg | ORAL_TABLET | Freq: Every day | ORAL | 0 refills | Status: AC

## 2017-10-12 MED ORDER — PREDNISONE 50 MG PO TABS: 50.0000 mg | ORAL_TABLET | Freq: Every day | ORAL | Status: DC

## 2017-10-12 MED ORDER — LEVOFLOXACIN IN D5W 250 MG/50ML IV SOLN
250.0000 mg | INTRAVENOUS | Status: DC
  Filled 2017-10-12: qty 50

## 2017-10-12 MED ORDER — PREDNISONE 10 MG PO TABS: ORAL_TABLET | ORAL | 0 refills | Status: DC

## 2017-10-12 MED ORDER — IPRATROPIUM-ALBUTEROL 20-100 MCG/ACT IN AERS: 1.0000 | INHALATION_SPRAY | Freq: Four times a day (QID) | RESPIRATORY_TRACT | Status: DC | PRN

## 2017-10-12 MED ORDER — SODIUM CHLORIDE 0.9 % IV SOLN
500.0000 mg | INTRAVENOUS | Status: DC
  Filled 2017-10-12: qty 500

## 2017-10-12 MED ORDER — DOXYCYCLINE HYCLATE 100 MG PO TABS: 100.0000 mg | ORAL_TABLET | Freq: Two times a day (BID) | ORAL | Status: DC

## 2017-10-12 NOTE — Progress Notes (Signed)
Pt had elevated T waves. Dr Marjie SkiffSridharan made aware. No new orders at this time.

## 2017-10-12 NOTE — Discharge Summary (Signed)
[facility] [place] [facility]   PATIENT NAME: David Rhodes    MR#:  [id number]  DATE OF BIRTH:  10-27-1955  DATE OF ADMISSION:  10/11/2017 ADMITTING PHYSICIAN: Cammy Copa, MD  DATE OF DISCHARGE: 10/12/2017  PRIMARY CARE PHYSICIAN: Reubin Milan, MD    ADMISSION DIAGNOSIS:  COPD exacerbation (HCC) [J44.1]  DISCHARGE DIAGNOSIS:  Acute Asthma exacerbation Acute mild bronchitis Seasonal allergies  SECONDARY DIAGNOSIS:   Past Medical History:  Diagnosis Date  . Allergy    Hay fever  . Anxiety   . Asthma   . Chronic pain due to trauma   . COPD (chronic obstructive pulmonary disease) (HCC)   . Depression   . GERD (gastroesophageal reflux disease)   . Headache(784.0)   . Hypertension   . Inguinal hernia    bilateral  . Neuromuscular disorder (HCC)    hx of tremors  . Pneumonia    many years ago  . Shortness of breath    when allergies flare up  . Urine incontinence   . Wears glasses     HOSPITAL COURSE:   Dayvion Sans  is a 62 y.o. male with a known history of hayfever, asthma, COPD, hypertension, neuromuscular disorder.  Patient denies smoking. Patient presented to emergency room for acute onset of shortness of breath, chest tightness, wheezing and productive cough going on for the past 2-3 days, gradually getting worse.  1. Acute hypoxic respiratory failure, likely secondary to COPD/asthma exacerbation and acute bronchitis.  -continue steroids tpaer, nebulizer treatments -doxycycline (more affordable) -weaned to room air  2.  Acute COPD exacerbation, secondary to acute bronchitis.  See treatment as above under #1.  3.  Hypertension, stable -pt does not take any meds his bp is quiet stable. He will monitor it at home  Mom was present in the room. D/c home    CONSULTS OBTAINED:    DRUG ALLERGIES:   Allergies  Allergen Reactions  . Penicillins Shortness Of Breath    Has patient had a PCN reaction causing  immediate rash, facial/tongue/throat swelling, SOB or lightheadedness with hypotension: Yes Has patient had a PCN reaction causing severe rash involving mucus membranes or skin necrosis: No Has patient had a PCN reaction that required hospitalization No Has patient had a PCN reaction occurring within the last 10 years: No If all of the above answers are "NO", then may proceed with Cephalosporin use.     DISCHARGE MEDICATIONS:   Allergies as of 10/12/2017      Reactions   Penicillins Shortness Of Breath   Has patient had a PCN reaction causing immediate rash, facial/tongue/throat swelling, SOB or lightheadedness with hypotension: Yes Has patient had a PCN reaction causing severe rash involving mucus membranes or skin necrosis: No Has patient had a PCN reaction that required hospitalization No Has patient had a PCN reaction occurring within the last 10 years: No If all of the above answers are "NO", then may proceed with Cephalosporin use.      Medication List    STOP taking these medications   amLODipine 10 MG tablet Commonly known as:  NORVASC   baclofen 10 MG tablet Commonly known as:  LIORESAL   fexofenadine 180 MG tablet Commonly known as:  ALLEGRA Replaced by:  loratadine 10 MG tablet   fluticasone furoate-vilanterol 100-25 MCG/INH Aepb Commonly known as:  BREO ELLIPTA   gabapentin 600 MG tablet Commonly known as:  NEURONTIN   lisinopril 40 MG tablet Commonly known as:  PRINIVIL,ZESTRIL  meloxicam 15 MG tablet Commonly known as:  MOBIC   montelukast 10 MG tablet Commonly known as:  SINGULAIR   Omeprazole 20 MG Tbec   rOPINIRole 2 MG tablet Commonly known as:  REQUIP   tiotropium 18 MCG inhalation capsule Commonly known as:  SPIRIVA HANDIHALER     TAKE these medications   albuterol 108 (90 Base) MCG/ACT inhaler Commonly known as:  PROVENTIL HFA;VENTOLIN HFA Inhale 2 puffs into the lungs every 6 (six) hours as needed for wheezing or shortness of breath.    doxycycline 100 MG tablet Commonly known as:  VIBRA-TABS Take 1 tablet (100 mg total) by mouth every 12 (twelve) hours.   loratadine 10 MG tablet Commonly known as:  CLARITIN Take 1 tablet (10 mg total) by mouth daily. Start taking on:  10/13/2017 Replaces:  fexofenadine 180 MG tablet   predniSONE 10 MG tablet Commonly known as:  DELTASONE Take 50 mg daily taper by 10 mg daily then stop Start taking on:  10/13/2017       If you experience worsening of your admission symptoms, develop shortness of breath, life threatening emergency, suicidal or homicidal thoughts you must seek medical attention immediately by calling 911 or calling your MD immediately  if symptoms less severe.  You Must read complete instructions/literature along with all the possible adverse reactions/side effects for all the Medicines you take and that have been prescribed to you. Take any new Medicines after you have completely understood and accept all the possible adverse reactions/side effects.   Please note  You were cared for by a hospitalist during your hospital stay. If you have any questions about your discharge medications or the care you received while you were in the hospital after you are discharged, you can call the unit and asked to speak with the hospitalist on call if the hospitalist that took care of you is not available. Once you are discharged, your primary care physician will handle any further medical issues. Please note that NO REFILLS for any discharge medications will be authorized once you are discharged, as it is imperative that you return to your primary care physician (or establish a relationship with a primary care physician if you do not have one) for your aftercare needs so that they can reassess your need for medications and monitor your lab values. Today   SUBJECTIVE   Eager to go home Mom in the room Feels backt o baseline sats 96% on RA  VITAL SIGNS:  Blood pressure (!) 125/92,  pulse 73, temperature (!) 97.5 F (36.4 C), temperature source Oral, resp. rate 18, height 6' (1.829 m), weight 31.6 kg (69 lb 10.7 oz), SpO2 96 %.  I/O:    Intake/Output Summary (Last 24 hours) at 10/12/2017 1102 Last data filed at 10/11/2017 2113 Gross per 24 hour  Intake 50 ml  Output -  Net 50 ml    PHYSICAL EXAMINATION:  GENERAL:  62 y.o.-year-old patient lying in the bed with no acute distress.  EYES: Pupils equal, round, reactive to light and accommodation. No scleral icterus. Extraocular muscles intact.  HEENT: Head atraumatic, normocephalic. Oropharynx and nasopharynx clear.  NECK:  Supple, no jugular venous distention. No thyroid enlargement, no tenderness.  LUNGS: Normal breath sounds bilaterally, no wheezing, rales,rhonchi or crepitation. No use of accessory muscles of respiration.  CARDIOVASCULAR: S1, S2 normal. No murmurs, rubs, or gallops.  ABDOMEN: Soft, non-tender, non-distended. Bowel sounds present. No organomegaly or mass.  EXTREMITIES: No pedal edema, cyanosis, or clubbing.  NEUROLOGIC:  Cranial nerves II through XII are intact. Muscle strength 5/5 in all extremities. Sensation intact. Gait not checked.  PSYCHIATRIC: The patient is alert and oriented x 3.  SKIN: No obvious rash, lesion, or ulcer.   DATA REVIEW:   CBC  Recent Labs  Lab 10/12/17 0416  WBC 16.0*  HGB 14.5  HCT 43.3  PLT 195    Chemistries  Recent Labs  Lab 10/12/17 0416  NA 134*  K 4.0  CL 102  CO2 26  GLUCOSE 178*  BUN 16  CREATININE 1.01  CALCIUM 9.8    Microbiology Results   No results found for this or any previous visit (from the past 240 hour(s)).  RADIOLOGY:  Dg Chest 2 View  Result Date: 10/11/2017 CLINICAL DATA:  Shortness of breath EXAM: CHEST - 2 VIEW COMPARISON:  03/21/2017 FINDINGS: Ascending stimulator device. Mild hyperinflation with bronchitic changes. No acute airspace disease or pleural effusion. Normal cardiomediastinal silhouette. No pneumothorax.  IMPRESSION: Hyperinflation with chronic bronchitic changes. No acute airspace disease Electronically Signed   By: Jasmine Pang M.D.   On: 10/11/2017 18:56     Management plans discussed with the patient, family and they are in agreement.  CODE STATUS:     Code Status Orders  (From admission, onward)        Start     Ordered   10/11/17 2235  Full code  Continuous     10/11/17 2234    Code Status History    This patient has a current code status but no historical code status.      TOTAL TIME TAKING CARE OF THIS PATIENT: 40 minutes.    Enedina Finner M.D on 10/12/2017 at 11:02 AM  Between 7am to 6pm - Pager - [phone] After 6pm go to [email or web address] - Social research officer, government  [facility]  Office  [phone]  CC: Primary care physician; Reubin Milan, MD

## 2017-10-12 NOTE — Progress Notes (Signed)
DISCHARGE NOTE:  Pt given discharge instructions. Pt verbalized understanding. Pt wheeled to car by volunteer.  

## 2017-10-13 ENCOUNTER — Telehealth: Payer: Self-pay

## 2017-10-13 NOTE — Telephone Encounter (Signed)
TOC #1. Called pt to f/u after d/c from [facility] on 10/12/17. Also wanted to confirm his hosp f/u appt w/ Dr. Judithann GravesBerglund on 10/16/17 @ 3:00pm. Discharge planning includes the following:  - Continue steroid taper and nebulizer txt's - Begin Doxycycline and Claritin - Discontinue Amlodipine, Baclofen, Allegra, Breo Ellipta, Gabapentin, Lisinopril, Meloxicam, Singulair, Omeprazole, Requip and Spiriva - F/u with PCP in 1 week As part of the TOC f/u call, I am also wanting to discuss/review the above information with the pt to ensure all of the above have been taken care of and that he has no questions or concerns re: his discharge instructions/care. LVM requesting returned call.

## 2017-10-14 ENCOUNTER — Telehealth: Payer: Self-pay

## 2017-10-14 NOTE — Telephone Encounter (Signed)
TOC #2. Called pt to f[facility]. Also wanted to confirm his hosp f/u appt w/ Dr. Judithann GravesBerglund on 10/16/17 @ 3:00pm. Discharge planning includes the following:  - Continue steroid taper and nebulizer txt's - Begin Doxycycline and Claritin - Discontinue Amlodipine, Baclofen, Allegra, Breo Ellipta, Gabapentin, Lisinopril, Meloxicam, Singulair, Omeprazole, Requip and Spiriva - F/u with PCP in 1 week As part of the TOC f/u call, I am also wanting to discuss/review the above information with the pt to ensure all of the above have been taken care of and that he has no questions or concerns re: his discharge instructions/care. LVM requesting returned call.

## 2017-10-15 ENCOUNTER — Telehealth: Payer: Self-pay

## 2017-10-15 DIAGNOSIS — Z599 Problem related to housing and economic circumstances, unspecified: Secondary | ICD-10-CM

## 2017-10-15 DIAGNOSIS — Z598 Other problems related to housing and economic circumstances: Secondary | ICD-10-CM

## 2017-10-15 NOTE — Telephone Encounter (Signed)
Transition Care Management Follow-Up Telephone Call   Date discharged and where: 10/12/17 from [facility]  How have you been since you were released from the hospital?  Symptoms are getting progressively worse. Pt further states he has not been taking any medications since his discharge due to financial reasons (steroid taper, nebulizer txt's, Doxycycline and Claritin). Advised that it would be expected for his symptoms to worsen if not taking medications. Further advised that I will send a referral to [facility] for financial assistance with medications. Further advised that he will receive a call from her today with further information. Advised he return to the ED if his symptoms become life threatening. Verbalized acceptance and understanding.  Any patient concerns? Financial strains re: medications. Referral placed to [facility] for medication assistance  Items Reviewed: Verified = V   Meds: V, but not taking  Allergies: V  Dietary Orders: Heart Healthy. Pt verbalized acceptance and understanding re: dietary limitations and restrictions  Functional Questionnaire:  Independent = I Dependent = D  ADLs: I  Dressing- I   Eating- I  Maintaining continence- I  Transferring- I  Transportation- I  Meal Prep- I  Managing Meds- I, although is not taking any medications at this time  Additional Items Reviewed:  Hospital f/u appt confirmed? Yes. Scheduled to see Dr. Judithann GravesBerglund on 10/16/17 @ 2:15am. Denies need for transportation arrangements.  If their condition worsens, is the pt aware to call PCP or go to the Emergency Dept.? Yes. Verbalized acceptance and understanding  Was the patient provided with contact information for the PCP's office or ED? Yes  Was to pt encouraged to call back with questions or concerns? Yes

## 2017-10-16 ENCOUNTER — Telehealth: Payer: Self-pay

## 2017-10-16 ENCOUNTER — Telehealth: Payer: Self-pay | Admitting: Internal Medicine

## 2017-10-16 ENCOUNTER — Inpatient Hospital Stay: Payer: Medicare Other | Admitting: Internal Medicine

## 2017-10-16 NOTE — Telephone Encounter (Signed)
Called Pt to discuss [employer] financial assistance for medications. c/b # [phone] Manuela SchwartzKathryn Brown

## 2017-10-16 NOTE — Telephone Encounter (Signed)
Called patient's spouse David Rhodes since unable to reach patient at his number. Informed her we are trying to reschedule him for Monday instead of this afternoon because of the weather. We are trying to make sure the staff and patient's are safe and do not want patient's traveling in this weather unless they have to. Informed her we are closing at lunch time today.   She then stated he (the patient) is currently sleeping but not breathing well. Seems to her he "is breathing shallow" and she is scared to wake him. I informed her she needs to try and wake him. If he does not wake up for any reason, or wakes up and seems out of it, lethargic, has chest pain, or too SOB she needs to call 911. She verbalized understanding of this. Gave her new appt time and date for Monday 04/22 at 330PM and she stated she wrote this down.  Told her to call us back if she needs us. She verbalized understanding.

## 2017-10-18 DIAGNOSIS — J189 Pneumonia, unspecified organism: Secondary | ICD-10-CM | POA: Insufficient documentation

## 2017-10-18 DIAGNOSIS — G250 Essential tremor: Secondary | ICD-10-CM | POA: Insufficient documentation

## 2017-10-19 ENCOUNTER — Inpatient Hospital Stay: Payer: Medicare Other | Admitting: Internal Medicine

## 2017-10-19 ENCOUNTER — Telehealth: Payer: Self-pay | Admitting: Internal Medicine

## 2017-10-19 MED ORDER — ALBUTEROL SULFATE (2.5 MG/3ML) 0.083% IN NEBU
2.50 | INHALATION_SOLUTION | RESPIRATORY_TRACT | Status: DC
Start: 2017-10-19 — End: 2017-10-19

## 2017-10-19 MED ORDER — ACETAMINOPHEN 325 MG PO TABS
650.00 | ORAL_TABLET | ORAL | Status: DC
Start: ? — End: 2017-10-19

## 2017-10-19 MED ORDER — GENERIC EXTERNAL MEDICATION
2.00 | Status: DC
Start: ? — End: 2017-10-19

## 2017-10-19 MED ORDER — IPRATROPIUM BROMIDE 0.02 % IN SOLN
500.00 | RESPIRATORY_TRACT | Status: DC
Start: 2017-10-19 — End: 2017-10-19

## 2017-10-19 MED ORDER — GENERIC EXTERNAL MEDICATION
60.00 | Status: DC
Start: 2017-10-20 — End: 2017-10-19

## 2017-10-19 MED ORDER — DOXYCYCLINE HYCLATE 100 MG PO TABS
100.00 | ORAL_TABLET | ORAL | Status: DC
Start: 2017-10-19 — End: 2017-10-19

## 2017-10-19 MED ORDER — ENOXAPARIN SODIUM 40 MG/0.4ML ~~LOC~~ SOLN
40.00 | SUBCUTANEOUS | Status: DC
Start: 2017-10-19 — End: 2017-10-19

## 2017-10-19 MED ORDER — INFLUENZA VAC SPLIT QUAD 0.5 ML IM SUSY
.50 | PREFILLED_SYRINGE | INTRAMUSCULAR | Status: DC
Start: ? — End: 2017-10-19

## 2017-10-19 MED ORDER — ONDANSETRON 4 MG PO TBDP
4.00 | ORAL_TABLET | ORAL | Status: DC
Start: ? — End: 2017-10-19

## 2017-10-19 MED ORDER — SODIUM CHLORIDE 0.9 % IV SOLN
100.00 | INTRAVENOUS | Status: DC
Start: ? — End: 2017-10-19

## 2017-10-19 MED ORDER — POLYETHYLENE GLYCOL 3350 17 G PO PACK
17.00 | PACK | ORAL | Status: DC
Start: ? — End: 2017-10-19

## 2017-10-19 NOTE — Progress Notes (Deleted)
Date:  10/19/2017   Name:  David Rhodes   DOB:  1955/10/24   MRN:  [id number]   Chief Complaint: No chief complaint on file. Admitted to [facility] 10/11/17 to 10/12/17 for COPD.  Received a TOC call on 10/15/17. D/C summary: 1. Acute hypoxic respiratory failure,likely secondary to COPD/asthma exacerbation and acute bronchitis. -continue steroids tpaer, nebulizer treatments -doxycycline (more affordable) -weaned to room air CXR: Hyperinflation with chronic bronchitic changes. No acute airspace Disease  2.Acute COPD exacerbation, secondary to acute bronchitis.See treatment as above under #1.  3.Hypertension,stable -pt does not take any meds his bp is quiet stable. He will monitor it at home  At the [facility] call he admitted that he had not purchased any of the medications prescribed due to cost.  A referral was made but it does not look like he has called them back.   Review of Systems  Patient Active Problem List   Diagnosis Date Noted  . Acute respiratory failure (HCC) 10/11/2017  . Cervical radiculopathy 07/23/2017  . Bilateral temporomandibular joint pain 07/23/2017  . Neck pain 05/19/2017  . Pulmonary nodules 12/31/2015  . COPD (chronic obstructive pulmonary disease) (HCC) 12/27/2015  . Chronic cough 12/27/2015  . Allergic rhinitis 11/11/2014  . Bursitis of shoulder 11/11/2014  . Nerve root pain 11/11/2014  . Dyskinesia 11/11/2014  . Acid reflux 11/11/2014  . Calcium blood increased 11/11/2014  . Affective disorder (HCC) 11/11/2014  . Hypertension 12/25/2011  . Chronic back pain 12/25/2011    Prior to Admission medications   Medication Sig Start Date End Date Taking? Authorizing Provider  albuterol (PROVENTIL HFA;VENTOLIN HFA) 108 (90 Base) MCG/ACT inhaler Inhale 2 puffs into the lungs every 6 (six) hours as needed for wheezing or shortness of breath. Patient not taking: Reported on 10/11/2017 03/21/17   Phineas Semen, MD  doxycycline (VIBRA-TABS) 100 MG  tablet Take 1 tablet (100 mg total) by mouth every 12 (twelve) hours. Patient not taking: Reported on 10/15/2017 10/12/17   Enedina Finner, MD  loratadine (CLARITIN) 10 MG tablet Take 1 tablet (10 mg total) by mouth daily. Patient not taking: Reported on 10/15/2017 10/13/17   Enedina Finner, MD  predniSONE (DELTASONE) 10 MG tablet Take 50 mg daily taper by 10 mg daily then stop Patient not taking: Reported on 10/15/2017 10/13/17   Enedina Finner, MD    Allergies  Allergen Reactions  . Penicillins Shortness Of Breath    Has patient had a PCN reaction causing immediate rash, facial/tongue/throat swelling, SOB or lightheadedness with hypotension: Yes Has patient had a PCN reaction causing severe rash involving mucus membranes or skin necrosis: No Has patient had a PCN reaction that required hospitalization No Has patient had a PCN reaction occurring within the last 10 years: No If all of the above answers are "NO", then may proceed with Cephalosporin use.     Past Surgical History:  Procedure Laterality Date  . DEEP BRAIN STIMULATOR PLACEMENT    . INGUINAL HERNIA REPAIR Bilateral 02/07/2014   Procedure:  BILATERAL INGUINAL HERNIA REPAIR;  Surgeon: Axel Filler, MD;  Location: [facility] OR;  Service: General;  Laterality: Bilateral;  . INSERTION OF MESH Bilateral 02/07/2014   Procedure: INSERTION OF MESH;  Surgeon: Axel Filler, MD;  Location: [facility] OR;  Service: General;  Laterality: Bilateral;  . NASAL SINUS SURGERY    . PULSE GENERATOR IMPLANT Left 09/11/2016   Procedure: Left chest-Change implantable pulse generator battery;  Surgeon: Maeola Harman, MD;  Location[facility] OR;  Service: Neurosurgery;  Laterality: Left;  Left chest-Change implantable pulse generator battery  . SUBTHALAMIC STIMULATOR BATTERY REPLACEMENT  12/09/2011   Procedure: SUBTHALAMIC STIMULATOR BATTERY REPLACEMENT;  Surgeon: Maeola HarmanJoseph Stern, MD;  Location: [facility] NEURO ORS;  Service: Neurosurgery;  Laterality: N/A;   deep brain stimulator, implantable  pulse generator change  . SUBTHALAMIC STIMULATOR BATTERY REPLACEMENT Left 04/21/2014   Procedure: Deep brain stimulator battery change;  Surgeon: Maeola HarmanJoseph Stern, MD;  Location: [facility] NEUR[facility] ORS;  Service: Neurosurgery;  Laterality: Left;  Deep brain stimulator battery change    Social History   Tobacco Use  . Smoking status: Never Smoker  . Smokeless tobacco: Current User    Types: Snuff  Substance Use Topics  . Alcohol use: No    Alcohol/week: 0.0 oz  . Drug use: No     Medication list has been reviewed and updated.  No flowsheet data found.  Physical Exam  There were no vitals taken for this visit.  Assessment and Plan:

## 2017-10-19 NOTE — Telephone Encounter (Signed)
Patient is being discharged today since there will not be a hospital follow slot I scheduled the patient with an OV for May 6.

## 2017-10-20 ENCOUNTER — Other Ambulatory Visit: Payer: Self-pay | Admitting: Internal Medicine

## 2017-10-20 NOTE — Telephone Encounter (Signed)
Transition Care Management Follow-Up Telephone Call   [facility]  How have you been since you were released from the hospital?  Symptoms still have not improved. Pt states he still has not been taking any medications since his discharge due to financial reasons (steroid taper, nebulizer txt's, Doxycycline and Claritin). Pt further states he was provided one dose of Prednisone and Doxy upon discharge and advised to ensure he picked up his Rx's from his pharmacy. Pt states he was "not in his right mind" and did not inform [facility] of his financial strains. Pt is aware that his symptoms will likely not improve without these medications. Advised he will receive a call today from Thomasene RippleAmy Baker, Care Guide, with further instructions about financial support for his medications. Verbalized acceptance and understanding. Advised he return to the ED if his symptoms become life threatening. Verbalized acceptance and understanding.  Any patient concerns? Financial strains re: medications. Referral still remains in place for medication assistance. Amy Baker, Care Guide, to complete referral today.  Items Reviewed: Verified = V   Meds: V, but still not taking. Has had one dose of Prednisone and Doxy [facility].  Allergies: V  Dietary Orders: Heart Healthy. Pt verbalized acceptance and understanding re: dietary limitations and restrictions  Functional Questionnaire:  Independent = I Dependent = D  ADLs: I  Dressing- I   Eating- I  Maintaining continence- I  Transferring- I  Transportation- I  Meal Prep- I  Managing Meds- I, although is not taking any medications at this time  Additional Items Reviewed:  Hospital f/u appt confirmed? Yes. Scheduled to see Dr. Judithann GravesBerglund on 11/02/17 @ 3:30pm. Denies need for transportation arrangements.  If their condition worsens, is the pt aware to call PCP or go to the Emergency Dept.? Yes. Verbalized acceptance  and understanding  Was the patient provided with contact information for the PCP's office or ED? Yes  Was to pt encouraged to call back with questions or concerns? Yes

## 2017-10-20 NOTE — Care Management (Signed)
Post discharge note entry 10/20/17- This RNCM was contacted by Care Guide regarding patient not affording his medications.  [facility]. [facility] in place.

## 2017-11-02 ENCOUNTER — Encounter: Payer: Self-pay | Admitting: Internal Medicine

## 2017-11-02 ENCOUNTER — Ambulatory Visit (INDEPENDENT_AMBULATORY_CARE_PROVIDER_SITE_OTHER): Payer: Medicare Other | Admitting: Internal Medicine

## 2017-11-02 VITALS — BP 122/80 | HR 98 | Resp 16 | Ht 70.0 in | Wt 155.2 lb

## 2017-11-02 DIAGNOSIS — J189 Pneumonia, unspecified organism: Secondary | ICD-10-CM | POA: Diagnosis not present

## 2017-11-02 DIAGNOSIS — I10 Essential (primary) hypertension: Secondary | ICD-10-CM

## 2017-11-02 DIAGNOSIS — R1032 Left lower quadrant pain: Secondary | ICD-10-CM

## 2017-11-02 DIAGNOSIS — M5412 Radiculopathy, cervical region: Secondary | ICD-10-CM

## 2017-11-02 DIAGNOSIS — G250 Essential tremor: Secondary | ICD-10-CM

## 2017-11-02 NOTE — Progress Notes (Signed)
Date:  11/02/2017   Name:  David Rhodes   DOB:  Nov 28, 1955   MRN:  [id number]   Chief Complaint: Hospitalization Follow-up (COPD/ASTHMA feeling better ) and Abdominal Pain (Had double operation on hernia years ago and now having numbness and mild pain x 2-3 months on L side. ) Admitted to [facility] 10/18/17 to 10/19/17 for COPD exacerbation and CAP due to medication non compliance.  He received TOC call on 10/20/17.  He still had not been able to afford his medication, despite that being the reason that he was re-admitted.  Financial assistance referral was made at that time.  He is being enrolled in [facility] Care Management.  Hospital course/summary: Daymian Rhodes is a 62 y.o. male with PMHx of asthma who presented to [facility] with increased work of breathing and productive cough found to have leukocytosis and hypoxemia due to pneumonia and asthma exacerbation.  #Community Acquired Pneumonia: Mr. Deskin CXR, elevated WBC at 18.6 with left shift, and reports of productive cough are concerning for community acquired pneumonia. Started on azithromycin and ceftriaxone in ED, transitioned to 5 day course of oral doxycycline (4/22-26). Sputum stain/culture pending at discharge.  #hypoxemia (resolved), likely asthma exacerbation: History of asthma with poor air movement on admission and lack of controller medication concerning for asthma exacerbation. He was given methylprednisolone  IV in the ED. Transitioned to Prednisone 60 mg QD for 5 days (4/22-4/26). Treated with scheduled duonebs in the hospital. Discharging with improved WOB. Started on Advair Diskus for controller medication. To follow up with PCP.   #Hyponatremia (resolved): Na 129 on admission. Likely due to dehydration, given exam findings. We treated him with IV fluid repletion. Na on disharge 137.  He feels that his breathing is back to baseline.  He is still using the inhaler but finished the prednisone and the  antibiotics.   Abdominal Pain  This is a new problem. The problem has been unchanged. The pain is located in the RLQ and LLQ. The pain is mild. The quality of the pain is cramping. Associated symptoms include arthralgias. Pertinent negatives include no diarrhea, fever, headaches or nausea.  Hypertension  This is a chronic problem. The problem is unchanged. The problem is controlled. Associated symptoms include neck pain. Pertinent negatives include no chest pain, headaches, palpitations or shortness of breath. Past treatments include calcium channel blockers and ACE inhibitors ([facility] told him to stop meds but has continued them).  Dyskinesia - he has a deep brain stimulator and sees Neurosurgery in [place].  [facility] tried to stop his requip and gabapentin but he continued them as well.   Review of Systems  Constitutional: Negative for chills, fatigue and fever.  Respiratory: Negative for cough, chest tightness, shortness of breath and wheezing.   Cardiovascular: Negative for chest pain and palpitations.  Gastrointestinal: Positive for abdominal pain. Negative for diarrhea and nausea.  Musculoskeletal: Positive for arthralgias and neck pain.  Neurological: Positive for tremors. Negative for dizziness and headaches.  Psychiatric/Behavioral: Negative for sleep disturbance.    Patient Active Problem List   Diagnosis Date Noted  . Essential tremor 10/18/2017  . Pneumonia 10/18/2017  . Acute respiratory failure (HCC) 10/11/2017  . Cervical radiculopathy 07/23/2017  . Bilateral temporomandibular joint pain 07/23/2017  . Neck pain 05/19/2017  . Pulmonary nodules 12/31/2015  . COPD (chronic obstructive pulmonary disease) (HCC) 12/27/2015  . Chronic cough 12/27/2015  . Asthma 09/24/2015  . Allergic rhinitis 11/11/2014  . Bursitis of shoulder 11/11/2014  . Nerve root  pain 11/11/2014  . Dyskinesia 11/11/2014  . Acid reflux 11/11/2014  . Calcium blood increased 11/11/2014  . Affective disorder  (HCC) 11/11/2014  . Hypertension 12/25/2011  . Chronic back pain 12/25/2011    Prior to Admission medications   Medication Sig Start Date End Date Taking? Authorizing Provider  albuterol (PROVENTIL HFA;VENTOLIN HFA) 108 (90 Base) MCG/ACT inhaler INHALE 1 TO 2 PUFFS BY MOUTH EVERY 6 HOURS AS NEEDED FOR WHEEZING OR SHORTNESS OF BREATH 10/20/17  Yes Reubin Milan, MD  loratadine (CLARITIN) 10 MG tablet Take 1 tablet (10 mg total) by mouth daily. 10/13/17  Yes Enedina Finner, MD    Allergies  Allergen Reactions  . Penicillins Shortness Of Breath    Has patient had a PCN reaction causing immediate rash, facial/tongue/throat swelling, SOB or lightheadedness with hypotension: Yes Has patient had a PCN reaction causing severe rash involving mucus membranes or skin necrosis: No Has patient had a PCN reaction that required hospitalization No Has patient had a PCN reaction occurring within the last 10 years: No If all of the above answers are "NO", then may proceed with Cephalosporin use.     Past Surgical History:  Procedure Laterality Date  . DEEP BRAIN STIMULATOR PLACEMENT    . INGUINAL HERNIA REPAIR Bilateral 02/07/2014   Procedure:  BILATERAL INGUINAL HERNIA REPAIR;  Surgeon: Axel Filler, MD;  Location: [facility] OR;  Service: General;  Laterality: Bilateral;  . INSERTION OF MESH Bilateral 02/07/2014   Procedure: INSERTION OF MESH;  Surgeon: Axel Filler, MD;  Location: [facility] OR;  Service: General;  Laterality: Bilateral;  . NASAL SINUS SURGERY    . PULSE GENERATOR IMPLANT Left 09/11/2016   Procedure: Left chest-Change implantable pulse generator battery;  Surgeon: Maeola Harman, MD;  Location[facility] OR;  Service: Neurosurgery;  Laterality: Left;  Left chest-Change implantable pulse generator battery  . SUBTHALAMIC STIMULATOR BATTERY REPLACEMENT  12/09/2011   Procedure: SUBTHALAMIC STIMULATOR BATTERY REPLACEMENT;  Surgeon: Maeola Harman, MD;  Location: [facility] NEUR[facility]S;  Service: Neurosurgery;  Laterality:  N/A;   deep brain stimulator, implantable pulse generator change  . SUBTHALAMIC STIMULATOR BATTERY REPLACEMENT Left 04/21/2014   Procedure: Deep brain stimulator battery change;  Surgeon: Maeola Harman, MD;  Location: [facility] NEUR[facility]S;  Service: Neurosurgery;  Laterality: Left;  Deep brain stimulator battery change    Social History   Tobacco Use  . Smoking status: Never Smoker  . Smokeless tobacco: Current User    Types: Snuff  Substance Use Topics  . Alcohol use: No    Alcohol/week: 0.0 oz  . Drug use: No     Medication list has been reviewed and updated.  PHQ 2/9 Scores 11/02/2017  PHQ - 2 Score 0    Physical Exam  Constitutional: He is oriented to person, place, and time. He appears well-developed. No distress.  HENT:  Head: Normocephalic and atraumatic.  Eyes: Pupils are equal, round, and reactive to light. EOM are normal.  Cardiovascular: Normal rate and regular rhythm.  Pulmonary/Chest: Effort normal. No respiratory distress.  Abdominal: Soft. Bowel sounds are normal. No hernia. Hernia confirmed negative in the ventral area, confirmed negative in the right inguinal area and confirmed negative in the left inguinal area.  Genitourinary: Right testis shows no mass, no swelling and no tenderness. Left testis shows no mass, no swelling and no tenderness.  Musculoskeletal: Normal range of motion.  Neurological: He is alert and oriented to person, place, and time.  Skin: Skin is warm and dry. No rash noted.  Psychiatric:  He has a normal mood and affect. His behavior is normal. Thought content normal.    BP 122/80   Pulse 98   Resp 16   Ht  (1.778 m)   Wt 155 lb 3.2 oz (70.4 kg)   SpO2 100%   BMI 22.27 kg/m   Assessment and Plan: 1. Community acquired pneumonia, unspecified laterality Appears resolved Continue inhaler if needed  2. Essential hypertension controlled  3. Cervical radiculopathy Followed by Neurosurgery  4. Essential tremor Continue gabapentin  and requip  5. Groin pain, left Patient is reassured - no evidence of hernia; continue to monitor for more persistent sx  No orders of the defined types were placed in this encounter.   Partially dictated using Animal nutritionist. Any errors are unintentional.  Bari Edward, MD [facility]  11/02/2017

## 2017-11-04 ENCOUNTER — Other Ambulatory Visit: Payer: Self-pay | Admitting: *Deleted

## 2017-11-04 ENCOUNTER — Telehealth: Payer: Self-pay | Admitting: *Deleted

## 2017-11-04 NOTE — Patient Outreach (Signed)
[facility]) Care Management  11/04/2017  David Rhodes Nov 03, 1955 [id number]  Referral via MD office; patient having difficulty affording medications; needs medication education & management.  Telephone call to patient; left HIPPA compliant voice mail requesting call back.   Plan: Send outreach letter> Will follow up.   Colleen Can, RN BSN CCM Care Management Coordinator [facility] Care Management  [phone]

## 2017-11-04 NOTE — Telephone Encounter (Signed)
Left message for patient to call back. Last seen in 2017 by VM.

## 2017-11-06 NOTE — Telephone Encounter (Signed)
Another message left for patient to return call.

## 2017-11-10 ENCOUNTER — Other Ambulatory Visit: Payer: Self-pay | Admitting: *Deleted

## 2017-11-10 NOTE — Patient Outreach (Signed)
[facility]) Care Management  11/10/2017  David Rhodes 1955-09-19 [id number]   Referral via MD office; patient having difficulty affording medications; needs medication education & management.  Telephone call attempt x 2; left HIPPA compliant voice mail requesting return call.  Plan: Will follow up 2-4 business days. Outreach letter sent 5/8.  Colleen Can, RN BSN CCM Care Management Coordinator [facility] Care Management  [phone]

## 2017-11-12 ENCOUNTER — Telehealth: Payer: Self-pay | Admitting: Neurology

## 2017-11-12 NOTE — Telephone Encounter (Signed)
Will Do!

## 2017-11-12 NOTE — Telephone Encounter (Signed)
Patient called and needs to have his Brain Stimulator adjusted. Dr. Arbutus Leas is scheduled out until September? Also, would he need a 60 Min Slot? Please Advise. Thanks

## 2017-11-12 NOTE — Telephone Encounter (Signed)
Patient no showed last two appts. He can be scheduled next available. 60 minute DBS appt. Thanks.

## 2017-11-16 ENCOUNTER — Other Ambulatory Visit: Payer: Self-pay | Admitting: *Deleted
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
## 2017-11-16 NOTE — Patient Outreach (Signed)
[facility]) Care Management  11/16/2017  David Rhodes 1956-03-13 [id number]   Referral via MD office; patient having difficulty affording medications; needs medication education & management.  Telephone call x 3; left HIPPA compliant voice mail requesting call back. Outreach letter sent 5/8.  Plan: Follow up 2 business days.  Colleen Can, RN BSN CCM Care Management Coordinator [facility] Care Management  [phone]

## 2017-11-17 ENCOUNTER — Encounter: Payer: Self-pay | Admitting: Neurology

## 2017-11-17 ENCOUNTER — Ambulatory Visit (INDEPENDENT_AMBULATORY_CARE_PROVIDER_SITE_OTHER): Payer: Medicare Other | Admitting: Neurology

## 2017-11-17 VITALS — BP 132/96 | HR 98 | Ht 72.0 in | Wt 153.0 lb

## 2017-11-17 DIAGNOSIS — G2581 Restless legs syndrome: Secondary | ICD-10-CM | POA: Diagnosis not present

## 2017-11-17 DIAGNOSIS — G250 Essential tremor: Secondary | ICD-10-CM | POA: Diagnosis not present

## 2017-11-17 NOTE — Procedures (Signed)
DBS Programming was performed.    Total time spent programming was 15 minutes.  Device was confirmed to be on.  Soft start was confirmed to be on.  Impedences were checked and were within normal limits (all less than 2000 with therapy imp being 881).  Battery was checked and was determined to be functioning normally  (2.85).  Final settings were as follows:  Group A:  Left brain electrode:     2-1+           ; Amplitude  3.7   V   ; Pulse width 90 microseconds;   Frequency   135   Hz.  Right brain electrode:     7-C+          ; Amplitude   3.4  V ;  Pulse width 110  microseconds;  Frequency   135    Hz.  Group B  Left brain electrode:     2-C+           ; Amplitude  4.7   V   ; Pulse width 60 microseconds;   Frequency   160   Hz.  Right brain electrode:     7-C+          ; Amplitude   3.7  V ;  Pulse width 90  microseconds;  Frequency   160    Hz.  Group B was active when pt left the office

## 2017-11-18 ENCOUNTER — Ambulatory Visit: Payer: Self-pay | Admitting: *Deleted

## 2017-11-19 ENCOUNTER — Telehealth: Payer: Self-pay | Admitting: Neurology

## 2017-11-19 NOTE — Telephone Encounter (Signed)
Referral faxed to [facility] healthcare to transfer care for DBS management per patient request. They will call him directly to schedule.

## 2017-11-27 ENCOUNTER — Other Ambulatory Visit: Payer: Self-pay | Admitting: Internal Medicine

## 2017-12-04 ENCOUNTER — Other Ambulatory Visit: Payer: Self-pay | Admitting: *Deleted

## 2017-12-04 NOTE — Patient Outreach (Signed)
[facility] Care Management  12/04/2017  Bradly BienenstockRonald E Gillham 1956-03-26 [id number]  Telephone call to primary care office to advise of no contact with patient after call attempts  & outreach letter. Transferred to office nurseline to leave message after being advised that referrals person was out of office.   Plan: Case closure.  Colleen CanLinda Naima Veldhuizen, RN BSN CCM Care Management Coordinator [facility] Care Management  [phone]

## 2017-12-14 ENCOUNTER — Telehealth: Payer: Self-pay | Admitting: Internal Medicine

## 2017-12-14 NOTE — Telephone Encounter (Signed)
Noted  

## 2017-12-14 NOTE — Telephone Encounter (Signed)
Pts sgo called that pt was having difficulty breathing and wanted to be seen today, I advise the schedule for today was full and since he is having shortness of breath he may need to go to UC or ER. Pt could be heard in the background saying he was not going to urgent care. I set them up to come in tomorrow at 1:30, which was the first available but advise that if it got worse to not wait and go to the ER.

## 2017-12-15 ENCOUNTER — Encounter: Payer: Self-pay | Admitting: Internal Medicine

## 2017-12-15 ENCOUNTER — Ambulatory Visit (INDEPENDENT_AMBULATORY_CARE_PROVIDER_SITE_OTHER): Payer: Medicare Other | Admitting: Internal Medicine

## 2017-12-15 VITALS — BP 126/84 | HR 96 | Temp 97.5°F | Resp 16 | Ht 72.0 in | Wt 145.0 lb

## 2017-12-15 DIAGNOSIS — J441 Chronic obstructive pulmonary disease with (acute) exacerbation: Secondary | ICD-10-CM | POA: Diagnosis not present

## 2017-12-15 DIAGNOSIS — I10 Essential (primary) hypertension: Secondary | ICD-10-CM

## 2017-12-15 MED ORDER — PREDNISONE 10 MG PO TABS: ORAL_TABLET | ORAL | 0 refills | Status: DC

## 2017-12-15 MED ORDER — AZITHROMYCIN 250 MG PO TABS: ORAL_TABLET | ORAL | 0 refills | Status: DC

## 2017-12-15 NOTE — Progress Notes (Signed)
Date:  12/15/2017   Name:  David Rhodes   DOB:  02-04-1956   MRN:  [id number]   Chief Complaint: Shortness of Breath (since 3-4 days ) and Cough COPD - Shortness of Breath  This is a recurrent problem. The current episode started in the past 7 days. The problem occurs constantly. The problem has been unchanged. Associated symptoms include sputum production and wheezing. Pertinent negatives include no fever, headaches, leg swelling, orthopnea, sore throat or vomiting. He has tried beta agonist inhalers (he misplaced his nebulizer machine) for the symptoms. His past medical history is significant for COPD.  Cough  This is a new problem. The current episode started in the past 7 days. The problem occurs every few hours. The cough is non-productive. Associated symptoms include shortness of breath and wheezing. Pertinent negatives include no chills, fever, headaches or sore throat. He has tried a beta-agonist inhaler for the symptoms. The treatment provided no relief. His past medical history is significant for COPD.  Hypertension  This is a chronic problem. Associated symptoms include shortness of breath. Pertinent negatives include no headaches or orthopnea. Past treatments include ACE inhibitors and calcium channel blockers. The current treatment provides significant improvement. Compliance problems include medication cost (taking meds only intermittently).       Review of Systems  Constitutional: Negative for chills, fatigue and fever.  HENT: Negative for sore throat and trouble swallowing.   Respiratory: Positive for cough, sputum production, shortness of breath and wheezing. Negative for chest tightness.   Cardiovascular: Negative for orthopnea and leg swelling.  Gastrointestinal: Negative for diarrhea and vomiting.  Neurological: Negative for dizziness and headaches.    Patient Active Problem List   Diagnosis Date Noted  . Essential tremor 10/18/2017  . Cervical radiculopathy  07/23/2017  . Bilateral temporomandibular joint pain 07/23/2017  . Neck pain 05/19/2017  . Pulmonary nodules 12/31/2015  . COPD (chronic obstructive pulmonary disease) (HCC) 12/27/2015  . Chronic cough 12/27/2015  . Asthma 09/24/2015  . Allergic rhinitis 11/11/2014  . Bursitis of shoulder 11/11/2014  . Nerve root pain 11/11/2014  . Dyskinesia 11/11/2014  . Acid reflux 11/11/2014  . Calcium blood increased 11/11/2014  . Affective disorder (HCC) 11/11/2014  . Essential hypertension 12/25/2011  . Chronic back pain 12/25/2011    Prior to Admission medications   Medication Sig Start Date End Date Taking? Authorizing Provider  albuterol (PROVENTIL HFA;VENTOLIN HFA) 108 (90 Base) MCG/ACT inhaler INHALE 1 TO 2 PUFFS BY MOUTH EVERY 6 HOURS AS NEEDED FOR WHEEZING OR SHORTNESS OF BREATH 10/20/17  Yes Reubin MilanBerglund, Cashius Grandstaff H, MD  amLODipine (NORVASC) 10 MG tablet Take 10 mg by mouth daily.    Yes [provider]  lisinopril (PRINIVIL,ZESTRIL) 40 MG tablet Take 40 mg by mouth daily.   Yes [provider]  loratadine (CLARITIN) 10 MG tablet Take 1 tablet (10 mg total) by mouth daily. 10/13/17  Yes Enedina FinnerPatel, Sona, MD  rOPINIRole (REQUIP) 2 MG tablet Take 2 mg by mouth 2 (two) times daily.    Yes [provider]  gabapentin (NEURONTIN) 600 MG tablet Take 600 mg by mouth 4 (four) times daily.     [provider]    Allergies  Allergen Reactions  . Penicillins Shortness Of Breath    Has patient had a PCN reaction causing immediate rash, facial/tongue/throat swelling, SOB or lightheadedness with hypotension: Yes Has patient had a PCN reaction causing severe rash involving mucus membranes or skin necrosis: No Has patient had a PCN  reaction that required hospitalization No Has patient had a PCN reaction occurring within the last 10 years: No If all of the above answers are "NO", then may proceed with Cephalosporin use.     Past Surgical History:  Procedure Laterality Date    . DEEP BRAIN STIMULATOR PLACEMENT    . INGUINAL HERNIA REPAIR Bilateral 02/07/2014   Procedure:  BILATERAL INGUINAL HERNIA REPAIR;  Surgeon: Axel Filler, MD;  Location: [facility] OR;  Service: General;  Laterality: Bilateral;  . INSERTION OF MESH Bilateral 02/07/2014   Procedure: INSERTION OF MESH;  Surgeon: Axel Filler, MD;  Location: [facility] OR;  Service: General;  Laterality: Bilateral;  . NASAL SINUS SURGERY    . PULSE GENERATOR IMPLANT Left 09/11/2016   Procedure: Left chest-Change implantable pulse generator battery;  Surgeon: Maeola Harman, MD;  Location: [facility] OR;  Service: Neurosurgery;  Laterality: Left;  Left chest-Change implantable pulse generator battery  . SUBTHALAMIC STIMULATOR BATTERY REPLACEMENT  12/09/2011   Procedure: SUBTHALAMIC STIMULATOR BATTERY REPLACEMENT;  Surgeon: Maeola Harman, MD;  Location: [facility] NEURO ORS;  Service: Neurosurgery;  Laterality: N/A;   deep brain stimulator, implantable pulse generator change  . SUBTHALAMIC STIMULATOR BATTERY REPLACEMENT Left 04/21/2014   Procedure: Deep brain stimulator battery change;  Surgeon: Maeola Harman, MD;  Location: [facility] NEUR[facility] ORS;  Service: Neurosurgery;  Laterality: Left;  Deep brain stimulator battery change    Social History   Tobacco Use  . Smoking status: Never Smoker  . Smokeless tobacco: Current User    Types: Snuff  Substance Use Topics  . Alcohol use: No    Alcohol/week: 0.0 oz  . Drug use: No     Medication list has been reviewed and updated.  Current Meds  Medication Sig  . albuterol (PROVENTIL HFA;VENTOLIN HFA) 108 (90 Base) MCG/ACT inhaler INHALE 1 TO 2 PUFFS BY MOUTH EVERY 6 HOURS AS NEEDED FOR WHEEZING OR SHORTNESS OF BREATH  . amLODipine (NORVASC) 10 MG tablet Take 10 mg by mouth daily.   Marland Kitchen gabapentin (NEURONTIN) 600 MG tablet Take 600 mg by mouth 4 (four) times daily.   Marland Kitchen lisinopril (PRINIVIL,ZESTRIL) 40 MG tablet Take 40 mg by mouth daily.  Marland Kitchen loratadine (CLARITIN) 10 MG tablet Take 1 tablet (10 mg total) by  mouth daily.  Marland Kitchen rOPINIRole (REQUIP) 2 MG tablet Take 2 mg by mouth 2 (two) times daily.     PHQ 2/9 Scores 11/02/2017  PHQ - 2 Score 0    Physical Exam  Constitutional: He appears well-developed and well-nourished.  Neck: Normal range of motion.  Cardiovascular: Normal rate, regular rhythm and normal heart sounds.  Pulmonary/Chest: He has decreased breath sounds. He has wheezes in the right upper field, the right middle field, the right lower field, the left upper field, the left middle field and the left lower field. He has no rhonchi.    BP 126/84   Pulse 96   Temp (!) 97.5 F (36.4 C) (Oral)   Resp 16   Ht 6' (1.829 m)   Wt 145 lb (65.8 kg)   SpO2 95%   BMI 19.67 kg/m   Assessment and Plan: 1. COPD with acute exacerbation (HCC) Begin steroid taper and Zpak as soon as possible Pt declined albuterol nebulizer tx here in the office - predniSONE (DELTASONE) 10 MG tablet; Take 6 on day 1and 2, 5 on day 3 and 4, 4 on day 5 and 6 , 3 on day 7 and 8, 2 on day 9 and 10 and 1 on day 11  and 12 then stop.  Dispense: 42 tablet; Refill: 0 - azithromycin (ZITHROMAX Z-PAK) 250 MG tablet; UAD  Dispense: 6 each; Refill: 0  2. Essential hypertension Pt urged to take his medication every day Repeat testing of BS was improved over initial reading   Meds ordered this encounter  Medications  . predniSONE (DELTASONE) 10 MG tablet    Sig: Take 6 on day 1and 2, 5 on day 3 and 4, 4 on day 5 and 6 , 3 on day 7 and 8, 2 on day 9 and 10 and 1 on day 11 and 12 then stop.    Dispense:  42 tablet    Refill:  0    Please dispense medication in a bottle, not a pill pack.  Marland Kitchen azithromycin (ZITHROMAX Z-PAK) 250 MG tablet    Sig: UAD    Dispense:  6 each    Refill:  0    Please dispense tablets in a both, pt can not use pill pack dispenser    Partially dictated using Animal nutritionist. Any errors are unintentional.  Bari Edward, MD [facility]  12/15/2017

## 2018-01-01 ENCOUNTER — Telehealth: Payer: Self-pay | Admitting: Neurology

## 2018-01-01 NOTE — Telephone Encounter (Signed)
[facility] due to the fact that they were much closer to his house and we were.  [facility] that after multiple attempts to contact the patient, they were unable to contact him to schedule an appointment.  Jade, please send the patient a letter letting him know this.  He generally waits a very long time to make his appointments and then shows up when his battery is needing changed.  If he wants to follow here, he needs to make an appointment here and if [facility], then he needs to give them a call.

## 2018-01-04 NOTE — Telephone Encounter (Signed)
Letter mailed

## 2018-01-13 ENCOUNTER — Other Ambulatory Visit: Payer: Self-pay | Admitting: Internal Medicine

## 2018-03-18 ENCOUNTER — Ambulatory Visit: Payer: Medicare Other | Admitting: Neurology

## 2018-03-31 ENCOUNTER — Other Ambulatory Visit: Payer: Self-pay | Admitting: Neurosurgery

## 2018-04-05 ENCOUNTER — Encounter (HOSPITAL_COMMUNITY): Payer: Self-pay | Admitting: *Deleted

## 2018-04-05 ENCOUNTER — Other Ambulatory Visit: Payer: Self-pay | Admitting: Neurosurgery

## 2018-04-05 NOTE — Progress Notes (Signed)
I was unable to reach patient by phone.  I left  A message on voice mail.  I instructed the patient to arrive at [facility] Main entrance at 1030.   , nothing to eat or drink after midnight.   I instructed the patient to take the following medications in the am with just enough water to get them down: Amlodipine, Gabapentin, Claritin, Requip.  If needed: Albuterol inhaler- bring it with you.I instructed patient to shower, wear clean clothes, brush teeth, I asked patient to not wear any lotions, powders, cologne, jewelry, piercing, make-up or nail polish.  I asked the patient to call [phone], in the am if there were any questions or problems.

## 2018-04-06 ENCOUNTER — Other Ambulatory Visit: Payer: Self-pay | Admitting: Neurosurgery

## 2018-04-06 ENCOUNTER — Ambulatory Visit (HOSPITAL_COMMUNITY): Payer: Medicare Other | Admitting: Anesthesiology

## 2018-04-06 ENCOUNTER — Inpatient Hospital Stay (HOSPITAL_COMMUNITY)
Admission: AD | Admit: 2018-04-06 | Discharge: 2018-04-08 | DRG: 093 | Disposition: A | Discharge status: Home Health | Payer: Medicare Other | Attending: Neurosurgery | Admitting: Neurosurgery

## 2018-04-06 ENCOUNTER — Encounter (HOSPITAL_COMMUNITY)
Admission: AD | Disposition: A | Discharge status: Home Health | Payer: Self-pay | Source: Home / Self Care | Attending: Neurosurgery

## 2018-04-06 DIAGNOSIS — Z8249 Family history of ischemic heart disease and other diseases of the circulatory system: Secondary | ICD-10-CM

## 2018-04-06 DIAGNOSIS — Z880 Allergy status to penicillin: Secondary | ICD-10-CM

## 2018-04-06 DIAGNOSIS — K5900 Constipation, unspecified: Secondary | ICD-10-CM | POA: Diagnosis not present

## 2018-04-06 DIAGNOSIS — Z720 Tobacco use: Secondary | ICD-10-CM

## 2018-04-06 DIAGNOSIS — B9561 Methicillin susceptible Staphylococcus aureus infection as the cause of diseases classified elsewhere: Secondary | ICD-10-CM | POA: Diagnosis present

## 2018-04-06 DIAGNOSIS — Z79899 Other long term (current) drug therapy: Secondary | ICD-10-CM | POA: Diagnosis not present

## 2018-04-06 DIAGNOSIS — J449 Chronic obstructive pulmonary disease, unspecified: Secondary | ICD-10-CM | POA: Diagnosis present

## 2018-04-06 DIAGNOSIS — A4901 Methicillin susceptible Staphylococcus aureus infection, unspecified site: Secondary | ICD-10-CM

## 2018-04-06 DIAGNOSIS — Z7951 Long term (current) use of inhaled steroids: Secondary | ICD-10-CM

## 2018-04-06 DIAGNOSIS — T85734A Infection and inflammatory reaction due to implanted electronic neurostimulator, generator, initial encounter: Secondary | ICD-10-CM | POA: Diagnosis present

## 2018-04-06 DIAGNOSIS — K219 Gastro-esophageal reflux disease without esophagitis: Secondary | ICD-10-CM | POA: Diagnosis present

## 2018-04-06 DIAGNOSIS — Z950 Presence of cardiac pacemaker: Secondary | ICD-10-CM | POA: Diagnosis not present

## 2018-04-06 DIAGNOSIS — F17220 Nicotine dependence, chewing tobacco, uncomplicated: Secondary | ICD-10-CM | POA: Diagnosis not present

## 2018-04-06 DIAGNOSIS — I10 Essential (primary) hypertension: Secondary | ICD-10-CM | POA: Diagnosis present

## 2018-04-06 DIAGNOSIS — T85731D Infection and inflammatory reaction due to implanted electronic neurostimulator of brain, electrode (lead), subsequent encounter: Secondary | ICD-10-CM | POA: Diagnosis not present

## 2018-04-06 DIAGNOSIS — G8929 Other chronic pain: Secondary | ICD-10-CM | POA: Diagnosis present

## 2018-04-06 DIAGNOSIS — Z803 Family history of malignant neoplasm of breast: Secondary | ICD-10-CM

## 2018-04-06 DIAGNOSIS — R251 Tremor, unspecified: Secondary | ICD-10-CM | POA: Diagnosis not present

## 2018-04-06 DIAGNOSIS — G250 Essential tremor: Secondary | ICD-10-CM | POA: Diagnosis present

## 2018-04-06 DIAGNOSIS — T827XXA Infection and inflammatory reaction due to other cardiac and vascular devices, implants and grafts, initial encounter: Secondary | ICD-10-CM | POA: Diagnosis present

## 2018-04-06 DIAGNOSIS — Z8042 Family history of malignant neoplasm of prostate: Secondary | ICD-10-CM

## 2018-04-06 DIAGNOSIS — Y831 Surgical operation with implant of artificial internal device as the cause of abnormal reaction of the patient, or of later complication, without mention of misadventure at the time of the procedure: Secondary | ICD-10-CM | POA: Diagnosis present

## 2018-04-06 DIAGNOSIS — S21102A Unspecified open wound of left front wall of thorax without penetration into thoracic cavity, initial encounter: Secondary | ICD-10-CM | POA: Diagnosis present

## 2018-04-06 DIAGNOSIS — T85731A Infection and inflammatory reaction due to implanted electronic neurostimulator of brain, electrode (lead), initial encounter: Secondary | ICD-10-CM | POA: Diagnosis not present

## 2018-04-06 HISTORY — DX: Infection and inflammatory reaction due to other cardiac and vascular devices, implants and grafts, initial encounter: T82.7XXA

## 2018-04-06 HISTORY — DX: Essential tremor: G25.0

## 2018-04-06 HISTORY — PX: PULSE GENERATOR IMPLANT: SHX5370

## 2018-04-06 LAB — CBC
HCT: 46.2 % (ref 39.0–52.0)
Hemoglobin: 14.9 g/dL (ref 13.0–17.0)
MCH: 29.2 pg (ref 26.0–34.0)
MCHC: 32.3 g/dL (ref 30.0–36.0)
MCV: 90.6 fL (ref 80.0–100.0)
NRBC: 0 % (ref 0.0–0.2)
Platelets: 239 10*3/uL (ref 150–400)
RBC: 5.1 MIL/uL (ref 4.22–5.81)
RDW: 13 % (ref 11.5–15.5)
WBC: 9 10*3/uL (ref 4.0–10.5)

## 2018-04-06 LAB — BASIC METABOLIC PANEL
ANION GAP: 7 (ref 5–15)
BUN: 9 mg/dL (ref 8–23)
CO2: 25 mmol/L (ref 22–32)
Calcium: 10.3 mg/dL (ref 8.9–10.3)
Chloride: 106 mmol/L (ref 98–111)
Creatinine, Ser: 0.86 mg/dL (ref 0.61–1.24)
GFR calc non Af Amer: >60 mL/min (ref >60)
Glucose, Bld: 85 mg/dL (ref 70–99)
POTASSIUM: 3.6 mmol/L (ref 3.5–5.1)
SODIUM: 138 mmol/L (ref 135–145)

## 2018-04-06 SURGERY — UNILATERAL PULSE GENERATOR IMPLANT
Anesthesia: General | Site: Chest | Laterality: Left

## 2018-04-06 MED ORDER — BACLOFEN 10 MG PO TABS
10.0000 mg | ORAL_TABLET | Freq: Three times a day (TID) | ORAL | Status: DC
  Administered 2018-04-06: 10 mg via ORAL
  Filled 2018-04-06 (×2): qty 1

## 2018-04-06 MED ORDER — MIDAZOLAM HCL 2 MG/2ML IJ SOLN
INTRAMUSCULAR | Status: AC
  Filled 2018-04-06: qty 2

## 2018-04-06 MED ORDER — ONDANSETRON HCL 4 MG/2ML IJ SOLN
INTRAMUSCULAR | Status: DC | PRN
  Administered 2018-04-06: 4 mg via INTRAVENOUS

## 2018-04-06 MED ORDER — ALBUTEROL SULFATE (2.5 MG/3ML) 0.083% IN NEBU: 2.5000 mg | INHALATION_SOLUTION | Freq: Four times a day (QID) | RESPIRATORY_TRACT | Status: DC | PRN

## 2018-04-06 MED ORDER — FENTANYL CITRATE (PF) 100 MCG/2ML IJ SOLN
INTRAMUSCULAR | Status: AC
  Filled 2018-04-06: qty 2

## 2018-04-06 MED ORDER — BISACODYL 10 MG RE SUPP: 10.0000 mg | Freq: Every day | RECTAL | Status: DC | PRN

## 2018-04-06 MED ORDER — 0.9 % SODIUM CHLORIDE (POUR BTL) OPTIME
TOPICAL | Status: DC | PRN
  Administered 2018-04-06: 1000 mL

## 2018-04-06 MED ORDER — BUPIVACAINE HCL (PF) 0.5 % IJ SOLN
INTRAMUSCULAR | Status: AC
  Filled 2018-04-06: qty 30

## 2018-04-06 MED ORDER — OXYCODONE HCL 5 MG PO TABS
ORAL_TABLET | ORAL | Status: AC
  Filled 2018-04-06: qty 1

## 2018-04-06 MED ORDER — CIPROFLOXACIN IN D5W 400 MG/200ML IV SOLN
400.0000 mg | Freq: Two times a day (BID) | INTRAVENOUS | Status: DC
  Administered 2018-04-07: 400 mg via INTRAVENOUS
  Filled 2018-04-06 (×2): qty 200

## 2018-04-06 MED ORDER — KCL IN DEXTROSE-NACL 20-5-0.45 MEQ/L-%-% IV SOLN
INTRAVENOUS | Status: AC
  Filled 2018-04-06: qty 1000

## 2018-04-06 MED ORDER — FLEET ENEMA 7-19 GM/118ML RE ENEM: 1.0000 | ENEMA | Freq: Once | RECTAL | Status: DC | PRN

## 2018-04-06 MED ORDER — DOCUSATE SODIUM 100 MG PO CAPS
100.0000 mg | ORAL_CAPSULE | Freq: Two times a day (BID) | ORAL | Status: DC
  Administered 2018-04-06: 100 mg via ORAL
  Filled 2018-04-06 (×2): qty 1

## 2018-04-06 MED ORDER — MOMETASONE FURO-FORMOTEROL FUM 200-5 MCG/ACT IN AERO
2.0000 | INHALATION_SPRAY | Freq: Two times a day (BID) | RESPIRATORY_TRACT | Status: DC
  Administered 2018-04-07 – 2018-04-08 (×3): 2 via RESPIRATORY_TRACT
  Filled 2018-04-06: qty 8.8

## 2018-04-06 MED ORDER — SODIUM CHLORIDE 0.9% FLUSH
3.0000 mL | Freq: Two times a day (BID) | INTRAVENOUS | Status: DC
  Administered 2018-04-08: 3 mL via INTRAVENOUS

## 2018-04-06 MED ORDER — PHENOL 1.4 % MT LIQD: 1.0000 | OROMUCOSAL | Status: DC | PRN

## 2018-04-06 MED ORDER — VANCOMYCIN HCL IN DEXTROSE 1-5 GM/200ML-% IV SOLN
INTRAVENOUS | Status: AC
  Filled 2018-04-06: qty 200

## 2018-04-06 MED ORDER — FENTANYL CITRATE (PF) 100 MCG/2ML IJ SOLN: 25.0000 ug | INTRAMUSCULAR | Status: DC | PRN

## 2018-04-06 MED ORDER — ACETAMINOPHEN 325 MG PO TABS: 325.0000 mg | ORAL_TABLET | ORAL | Status: DC | PRN

## 2018-04-06 MED ORDER — PHENYLEPHRINE HCL 10 MG/ML IJ SOLN
INTRAMUSCULAR | Status: AC
  Filled 2018-04-06: qty 1

## 2018-04-06 MED ORDER — LISINOPRIL 20 MG PO TABS
40.0000 mg | ORAL_TABLET | Freq: Every day | ORAL | Status: DC
  Filled 2018-04-06: qty 2

## 2018-04-06 MED ORDER — HYDROCODONE-ACETAMINOPHEN 5-325 MG PO TABS: 2.0000 | ORAL_TABLET | ORAL | Status: DC | PRN

## 2018-04-06 MED ORDER — VANCOMYCIN HCL IN DEXTROSE 1-5 GM/200ML-% IV SOLN: 1000.0000 mg | Freq: Once | INTRAVENOUS | Status: DC

## 2018-04-06 MED ORDER — MORPHINE SULFATE (PF) 2 MG/ML IV SOLN: 2.0000 mg | INTRAVENOUS | Status: DC | PRN

## 2018-04-06 MED ORDER — OXYCODONE HCL 5 MG PO TABS: 5.0000 mg | ORAL_TABLET | Freq: Once | ORAL | Status: DC | PRN

## 2018-04-06 MED ORDER — ACETAMINOPHEN 650 MG RE SUPP: 650.0000 mg | RECTAL | Status: DC | PRN

## 2018-04-06 MED ORDER — CIPROFLOXACIN IN D5W 400 MG/200ML IV SOLN
INTRAVENOUS | Status: AC
  Filled 2018-04-06: qty 200

## 2018-04-06 MED ORDER — VANCOMYCIN HCL 1000 MG IV SOLR
INTRAVENOUS | Status: AC
  Filled 2018-04-06: qty 1000

## 2018-04-06 MED ORDER — MELOXICAM 7.5 MG PO TABS
15.0000 mg | ORAL_TABLET | Freq: Every day | ORAL | Status: DC
  Filled 2018-04-06: qty 2

## 2018-04-06 MED ORDER — MONTELUKAST SODIUM 10 MG PO TABS
10.0000 mg | ORAL_TABLET | Freq: Every day | ORAL | Status: DC
  Administered 2018-04-06: 10 mg via ORAL
  Filled 2018-04-06: qty 1

## 2018-04-06 MED ORDER — MENTHOL 3 MG MT LOZG: 1.0000 | LOZENGE | OROMUCOSAL | Status: DC | PRN

## 2018-04-06 MED ORDER — LACTATED RINGERS IV SOLN
INTRAVENOUS | Status: DC
  Administered 2018-04-06: 12:00:00 via INTRAVENOUS

## 2018-04-06 MED ORDER — HYDROCODONE-ACETAMINOPHEN 5-325 MG PO TABS: 1.0000 | ORAL_TABLET | ORAL | Status: DC | PRN

## 2018-04-06 MED ORDER — SODIUM CHLORIDE 0.9 % IV SOLN
INTRAVENOUS | Status: DC | PRN
  Administered 2018-04-06: 10 ug/min via INTRAVENOUS
  Administered 2018-04-06: 20 ug/min via INTRAVENOUS

## 2018-04-06 MED ORDER — LIDOCAINE HCL (CARDIAC) PF 100 MG/5ML IV SOSY
PREFILLED_SYRINGE | INTRAVENOUS | Status: DC | PRN
  Administered 2018-04-06: 60 mg via INTRATRACHEAL

## 2018-04-06 MED ORDER — ACETAMINOPHEN 325 MG PO TABS: 650.0000 mg | ORAL_TABLET | ORAL | Status: DC | PRN

## 2018-04-06 MED ORDER — TIOTROPIUM BROMIDE MONOHYDRATE 18 MCG IN CAPS
18.0000 ug | ORAL_CAPSULE | Freq: Every day | RESPIRATORY_TRACT | Status: DC
  Filled 2018-04-06: qty 5

## 2018-04-06 MED ORDER — FENTANYL CITRATE (PF) 250 MCG/5ML IJ SOLN
INTRAMUSCULAR | Status: DC | PRN
  Administered 2018-04-06: 100 ug via INTRAVENOUS
  Administered 2018-04-06: 50 ug via INTRAVENOUS

## 2018-04-06 MED ORDER — HYDRALAZINE HCL 20 MG/ML IJ SOLN
10.0000 mg | INTRAMUSCULAR | Status: DC | PRN
  Administered 2018-04-06: 10 mg via INTRAVENOUS

## 2018-04-06 MED ORDER — ALBUTEROL SULFATE (2.5 MG/3ML) 0.083% IN NEBU: 3.0000 mL | INHALATION_SOLUTION | Freq: Four times a day (QID) | RESPIRATORY_TRACT | Status: DC | PRN

## 2018-04-06 MED ORDER — AMLODIPINE BESYLATE 10 MG PO TABS
10.0000 mg | ORAL_TABLET | Freq: Every day | ORAL | Status: DC
  Filled 2018-04-06: qty 1

## 2018-04-06 MED ORDER — ONDANSETRON HCL 4 MG/2ML IJ SOLN
INTRAMUSCULAR | Status: AC
  Filled 2018-04-06: qty 2

## 2018-04-06 MED ORDER — LORATADINE 10 MG PO TABS
10.0000 mg | ORAL_TABLET | Freq: Every day | ORAL | Status: DC
  Filled 2018-04-06: qty 1

## 2018-04-06 MED ORDER — LIDOCAINE-EPINEPHRINE 1 %-1:100000 IJ SOLN
INTRAMUSCULAR | Status: AC
  Filled 2018-04-06: qty 1

## 2018-04-06 MED ORDER — ACETAMINOPHEN 160 MG/5ML PO SOLN: 325.0000 mg | ORAL | Status: DC | PRN

## 2018-04-06 MED ORDER — FENTANYL CITRATE (PF) 250 MCG/5ML IJ SOLN
INTRAMUSCULAR | Status: AC
  Filled 2018-04-06: qty 5

## 2018-04-06 MED ORDER — SODIUM CHLORIDE 0.9 % IV SOLN
250.0000 mL | INTRAVENOUS | Status: DC
  Administered 2018-04-07 (×2): 250 mL via INTRAVENOUS

## 2018-04-06 MED ORDER — ONDANSETRON HCL 4 MG PO TABS: 4.0000 mg | ORAL_TABLET | Freq: Four times a day (QID) | ORAL | Status: DC | PRN

## 2018-04-06 MED ORDER — VANCOMYCIN HCL 1000 MG IV SOLR
INTRAVENOUS | Status: DC | PRN
  Administered 2018-04-06: 1000 mg

## 2018-04-06 MED ORDER — HYDRALAZINE HCL 20 MG/ML IJ SOLN
INTRAMUSCULAR | Status: AC
  Filled 2018-04-06: qty 1

## 2018-04-06 MED ORDER — VANCOMYCIN HCL IN DEXTROSE 1-5 GM/200ML-% IV SOLN
1000.0000 mg | Freq: Two times a day (BID) | INTRAVENOUS | Status: DC
  Administered 2018-04-07 (×2): 1000 mg via INTRAVENOUS
  Filled 2018-04-06 (×3): qty 200

## 2018-04-06 MED ORDER — GABAPENTIN 600 MG PO TABS
600.0000 mg | ORAL_TABLET | Freq: Four times a day (QID) | ORAL | Status: DC
  Administered 2018-04-06: 600 mg via ORAL
  Filled 2018-04-06 (×3): qty 1

## 2018-04-06 MED ORDER — ONDANSETRON HCL 4 MG/2ML IJ SOLN: 4.0000 mg | Freq: Four times a day (QID) | INTRAMUSCULAR | Status: DC | PRN

## 2018-04-06 MED ORDER — PANTOPRAZOLE SODIUM 40 MG PO TBEC
40.0000 mg | DELAYED_RELEASE_TABLET | Freq: Every day | ORAL | Status: DC
  Administered 2018-04-06: 40 mg via ORAL
  Filled 2018-04-06: qty 1

## 2018-04-06 MED ORDER — KCL IN DEXTROSE-NACL 20-5-0.45 MEQ/L-%-% IV SOLN
INTRAVENOUS | Status: DC
  Administered 2018-04-06: 19:00:00 via INTRAVENOUS

## 2018-04-06 MED ORDER — SODIUM CHLORIDE 0.9% FLUSH: 3.0000 mL | INTRAVENOUS | Status: DC | PRN

## 2018-04-06 MED ORDER — ONDANSETRON HCL 4 MG/2ML IJ SOLN: 4.0000 mg | Freq: Once | INTRAMUSCULAR | Status: DC | PRN

## 2018-04-06 MED ORDER — LIDOCAINE-EPINEPHRINE 1 %-1:100000 IJ SOLN
INTRAMUSCULAR | Status: DC | PRN
  Administered 2018-04-06: 3.5 mL

## 2018-04-06 MED ORDER — MIDAZOLAM HCL 2 MG/2ML IJ SOLN
INTRAMUSCULAR | Status: DC | PRN
  Administered 2018-04-06: 2 mg via INTRAVENOUS

## 2018-04-06 MED ORDER — BACITRACIN ZINC 500 UNIT/GM EX OINT
TOPICAL_OINTMENT | CUTANEOUS | Status: DC | PRN
  Administered 2018-04-06: 1 via TOPICAL

## 2018-04-06 MED ORDER — ROPINIROLE HCL 1 MG PO TABS
2.0000 mg | ORAL_TABLET | Freq: Two times a day (BID) | ORAL | Status: DC
  Administered 2018-04-06: 2 mg via ORAL
  Filled 2018-04-06 (×2): qty 2

## 2018-04-06 MED ORDER — BACITRACIN ZINC 500 UNIT/GM EX OINT
TOPICAL_OINTMENT | CUTANEOUS | Status: AC
  Filled 2018-04-06: qty 28.35

## 2018-04-06 MED ORDER — UMECLIDINIUM BROMIDE 62.5 MCG/INH IN AEPB
1.0000 | INHALATION_SPRAY | Freq: Every day | RESPIRATORY_TRACT | Status: DC
  Administered 2018-04-07: 1 via RESPIRATORY_TRACT
  Filled 2018-04-06: qty 7

## 2018-04-06 MED ORDER — CHLORHEXIDINE GLUCONATE CLOTH 2 % EX PADS: 6.0000 | MEDICATED_PAD | Freq: Once | CUTANEOUS | Status: DC

## 2018-04-06 MED ORDER — BUPIVACAINE HCL (PF) 0.5 % IJ SOLN
INTRAMUSCULAR | Status: DC | PRN
  Administered 2018-04-06: 3.5 mL

## 2018-04-06 MED ORDER — CIPROFLOXACIN IN D5W 400 MG/200ML IV SOLN
400.0000 mg | Freq: Once | INTRAVENOUS | Status: AC
  Administered 2018-04-06: 400 mg via INTRAVENOUS

## 2018-04-06 MED ORDER — PROPOFOL 10 MG/ML IV BOLUS
INTRAVENOUS | Status: DC | PRN
  Administered 2018-04-06: 150 mg via INTRAVENOUS

## 2018-04-06 MED ORDER — ALUM & MAG HYDROXIDE-SIMETH 200-200-20 MG/5ML PO SUSP: 30.0000 mL | Freq: Four times a day (QID) | ORAL | Status: DC | PRN

## 2018-04-06 MED ORDER — SUGAMMADEX SODIUM 200 MG/2ML IV SOLN
INTRAVENOUS | Status: DC | PRN
  Administered 2018-04-06: 200 mg via INTRAVENOUS

## 2018-04-06 MED ORDER — ROCURONIUM BROMIDE 100 MG/10ML IV SOLN
INTRAVENOUS | Status: DC | PRN
  Administered 2018-04-06: 50 mg via INTRAVENOUS

## 2018-04-06 MED ORDER — OXYCODONE HCL 5 MG/5ML PO SOLN: 5.0000 mg | Freq: Once | ORAL | Status: DC | PRN

## 2018-04-06 MED ORDER — MEPERIDINE HCL 50 MG/ML IJ SOLN: 6.2500 mg | INTRAMUSCULAR | Status: DC | PRN

## 2018-04-06 MED ORDER — THROMBIN 5000 UNITS EX SOLR
CUTANEOUS | Status: AC
  Filled 2018-04-06: qty 5000

## 2018-04-06 MED ORDER — POLYETHYLENE GLYCOL 3350 17 G PO PACK: 17.0000 g | PACK | Freq: Every day | ORAL | Status: DC | PRN

## 2018-04-06 MED ORDER — VANCOMYCIN HCL IN DEXTROSE 1-5 GM/200ML-% IV SOLN
1000.0000 mg | INTRAVENOUS | Status: AC
  Administered 2018-04-06: 1000 mg via INTRAVENOUS

## 2018-04-06 MED ORDER — PROPOFOL 10 MG/ML IV BOLUS
INTRAVENOUS | Status: AC
  Filled 2018-04-06: qty 20

## 2018-04-06 SURGICAL SUPPLY — 48 items
ADH SKN CLS APL DERMABOND .7 (GAUZE/BANDAGES/DRESSINGS) ×1
BANDAGE ADH SHEER 1  50/CT (GAUZE/BANDAGES/DRESSINGS) IMPLANT
BLADE CLIPPER SURG (BLADE) IMPLANT
BOOT SUTURE AID YELLOW STND (SUTURE) IMPLANT
CANISTER SUCT 3000ML PPV (MISCELLANEOUS) ×3 IMPLANT
CLIP RANEY DISP (INSTRUMENTS) IMPLANT
COVER BACK TABLE 60X90IN (DRAPES) ×1 IMPLANT
COVER WAND RF STERILE (DRAPES) ×3 IMPLANT
DECANTER SPIKE VIAL GLASS SM (MISCELLANEOUS) ×3 IMPLANT
DERMABOND ADVANCED (GAUZE/BANDAGES/DRESSINGS) ×2
DERMABOND ADVANCED .7 DNX12 (GAUZE/BANDAGES/DRESSINGS) ×1 IMPLANT
DRAIN JACKSON PRATT 10MM FLAT (MISCELLANEOUS) ×2 IMPLANT
DRAPE C-ARM 42X72 X-RAY (DRAPES) IMPLANT
DRAPE CAMERA CLOSED 9X96 (DRAPES) ×3 IMPLANT
DRAPE ORTHO SPLIT 77X108 STRL (DRAPES) ×6
DRAPE SURG ORHT 6 SPLT 77X108 (DRAPES) ×2 IMPLANT
DRSG OPSITE POSTOP 3X4 (GAUZE/BANDAGES/DRESSINGS) ×2 IMPLANT
DRSG OPSITE POSTOP 4X6 (GAUZE/BANDAGES/DRESSINGS) ×2 IMPLANT
DRSG TELFA 3X8 NADH (GAUZE/BANDAGES/DRESSINGS) IMPLANT
DURAPREP 26ML APPLICATOR (WOUND CARE) ×3 IMPLANT
EVACUATOR SILICONE 100CC (DRAIN) ×2 IMPLANT
GAUZE 4X4 16PLY RFD (DISPOSABLE) IMPLANT
GAUZE SPONGE 4X4 12PLY STRL (GAUZE/BANDAGES/DRESSINGS) IMPLANT
GLOVE BIO SURGEON STRL SZ8 (GLOVE) ×3 IMPLANT
GLOVE BIOGEL PI IND STRL 6.5 (GLOVE) IMPLANT
GLOVE BIOGEL PI IND STRL 7.0 (GLOVE) IMPLANT
GLOVE BIOGEL PI INDICATOR 6.5 (GLOVE) ×4
GLOVE BIOGEL PI INDICATOR 7.0 (GLOVE) ×2
GLOVE ECLIPSE 8.0 STRL XLNG CF (GLOVE) ×3 IMPLANT
GLOVE INDICATOR 8.0 STRL GRN (GLOVE) ×3 IMPLANT
GLOVE INDICATOR 8.5 STRL (GLOVE) ×3 IMPLANT
GLOVE SURG SS PI 6.0 STRL IVOR (GLOVE) ×8 IMPLANT
KIT BASIN OR (CUSTOM PROCEDURE TRAY) ×3 IMPLANT
KIT REMOVER STAPLE SKIN (MISCELLANEOUS) ×1 IMPLANT
MARKER SKIN DUAL TIP RULER LAB (MISCELLANEOUS) ×3 IMPLANT
NDL HYPO 25X1 1.5 SAFETY (NEEDLE) ×1 IMPLANT
NDL SPNL 18GX3.5 QUINCKE PK (NEEDLE) IMPLANT
NEEDLE HYPO 25X1 1.5 SAFETY (NEEDLE) ×3 IMPLANT
NEEDLE SPNL 18GX3.5 QUINCKE PK (NEEDLE) IMPLANT
PACK LAMINECTOMY NEURO (CUSTOM PROCEDURE TRAY) ×3 IMPLANT
PAD ARMBOARD 7.5X6 YLW CONV (MISCELLANEOUS) ×7 IMPLANT
PAD DRESSING TELFA 3X8 NADH (GAUZE/BANDAGES/DRESSINGS) ×1 IMPLANT
STAPLER SKIN PROX WIDE 3.9 (STAPLE) ×3 IMPLANT
SUT ETHILON 3 0 PS 1 (SUTURE) IMPLANT
SUT SILK 2 0 TIES 10X30 (SUTURE) ×4 IMPLANT
SUT VIC AB 2-0 CP2 18 (SUTURE) ×3 IMPLANT
SUT VIC AB 2-0 CT2 18 VCP726D (SUTURE) ×4 IMPLANT
SUT VIC AB 3-0 SH 8-18 (SUTURE) ×3 IMPLANT

## 2018-04-06 NOTE — Transfer of Care (Signed)
Immediate Anesthesia Transfer of Care Note  Patient: David Rhodes  Procedure(s) Performed: Revision of left chest implantable pulse generator, extension, and pocket adapter (Left Chest)  Patient Location: PACU  Anesthesia Type:General  Level of Consciousness: awake, alert , oriented and patient cooperative  Airway & Oxygen Therapy: Patient Spontanous Breathing and Patient connected to nasal cannula oxygen  Post-op Assessment: Report given to RN and Post -op Vital signs reviewed and stable  Post vital signs: Reviewed and stable  Last Vitals:  Vitals Value Taken Time  BP 158/101 04/06/2018  5:49 PM  Temp    Pulse 62 04/06/2018  5:50 PM  Resp 13 04/06/2018  5:50 PM  SpO2 100 % 04/06/2018  5:50 PM  Vitals shown include unvalidated device data.  Last Pain:  Vitals:   04/06/18 1146  TempSrc:   PainSc: 0-No pain         Complications: No apparent anesthesia complications

## 2018-04-06 NOTE — Op Note (Signed)
04/06/2018  5:37 PM  PATIENT:  David Rhodes  62 y.o. male  PRE-OPERATIVE DIAGNOSIS: Chest pocket infection   POST-OPERATIVE DIAGNOSIS:  Same  PROCEDURE:  Procedure(s): Revision of left chest implantable pulse generator, extension, and pocket adapter (Left)  SURGEON:  Surgeon(s) and Role:    Maeola Harman, MD - Primary  PHYSICIAN ASSISTANT:   ASSISTANTS: Poteat, RN   ANESTHESIA:   general  EBL:  Minimal  BLOOD ADMINISTERED:none  DRAINS: (10) Jackson-Pratt drain(s) with closed bulb suction in the pocket   LOCAL MEDICATIONS USED:  MARCAINE    and LIDOCAINE   SPECIMEN:  Source of Specimen:  cultures from cranial incision and chest pocket  DISPOSITION OF SPECIMEN:  Microbiology  COUNTS:  YES  TOURNIQUET:  * No tourniquets in log *  DICTATION: DICTATION: Patient has implanted vim stimulator electrodes and IPG, 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  He has been draining from an open wound on his chest for the last four months.  We have elected to cut the extensions at the head and to removed the infected IPG.  PROCEDURE: Patient was brought to the operating room and given general anesthesia.  Cranial connectors were prepped and draped separately.  This area was infiltrated with lidocaine, incision was made, extensions identified and then cut.  Lead covers were kept intact.  Cultures were obtained.  Wound was closed with 2-0 Vicryl sutures and staples.  Left upper chest was prepped with betadine scrub and paint.  Area of planned incision was infiltrated with lidocaine.  Prior incision was reopened and the old IPG was removed.  Open wounds were extended and adaptor and IPG were removed along with extensions.  Cultures were obtained. Wound was irrigated with bacitracin and  with vancomycin. Then irrigated once more.  Incision was closed with 2-0 Vicryl and staples.  A #10 JP drain was placed through a separate stab incision and wounds were dressed with a sterile occlusive dressing.  Counts were  correct at the end of the case.  PLAN OF CARE: Admit to inpatient   PATIENT DISPOSITION:  PACU - hemodynamically stable.   Delay start of Pharmacological VTE agent (>24hrs) due to surgical blood loss or risk of bleeding: yes

## 2018-04-06 NOTE — H&P (Signed)
Patient ID:   [id number] Patient: David Rhodes  Date of Birth: 07-17-1955 Visit Type: Office Visit   Date: 03/31/2018 01:45 PM Provider: Danae Orleans. Venetia Maxon MD   This 62 year old male presents for Wound check up.  HISTORY OF PRESENT ILLNESS:  1.  Wound check up  Patient visits today after calling on the 30th.  Returned calls found line disconnected. He opens his shirt to reveal 1cm open left chest incision with IPG and extension visible.  Site is without drainage. Some pocket swelling is visible. He denies fevers.  He notes DBS continues to work well for his symptoms.         Medical/Surgical/Interim History Reviewed, no change.  Last detailed document date:04/18/2014.     Family History:  Reviewed, no changes.  Last detailed document date:04/18/2014.   Social History: Reviewed, no changes. Last detailed document date: 04/18/2014.    MEDICATIONS: (added, continued or stopped this visit) Started Medication Directions Instruction Stopped   gabapentin 600 mg tablet take 1 tablet by oral route 3 times every day     omeprazole 20 mg capsule,delayed release take 1 capsule by oral route  every day 30 minutes to 1 hour before a meal     Requip take 1 tablet by oral route  every bedtime     Singulair take 1 tablet by oral route  every day in the evening     Spiriva with HandiHaler 18 mcg and inhalation capsules inhale 1 capsule by inhalation route  every day using 2 inhalations via handihaler       ALLERGIES: Ingredient Reaction Medication Name Comment  PENICILLINS Unknown        PHYSICAL EXAM:   Vitals Date Temp F BP Pulse Ht In Wt Lb BMI BSA Pain Score  03/31/2018  130/89 96 72 185 25.09  0/10      IMPRESSION:   [name] returns in good spirits, as per his usual.  He states he has avoided calling over the last couple months as he feared we would have to remove his deep brain stimulator.  He notes only positional pain at the left chest IPG site.   Unfortunately, however, his deep brain stimulator implanted pulse generator is visible through a 1 cm skin ulceration.  PLAN:  We discussed the very real possibility that his entire device (deep brain stimulator electrodes/leads, extension and IPG) might need to come out.  This would leave him without any tremor control, severely compromising his mobility and ability to care for himself.  In light of this, an attempt will be made to preserve his deep brain stimulator electrodes, by replacing the IPG and extension.  New extension would be tunneled possibly to his abdomen where and new pulse generator would be placed.  He is agreeable to this plan.  He will keep the site covered with sterile dressings in the meanwhile; and we will plan to proceed with surgery on Tuesday at [facility].  Orders: Instruction(s)/Education: Assessment Instruction  R03.0 Hypertension education  Z68.25 Lifestyle education regarding diet   Completed Orders (this encounter) Order Details Reason Side Interpretation Result Initial Treatment Date Region  Hypertension education Continue to monitor blood pressure, if blood pressure remains elevated contact primary doctor        Lifestyle education regarding diet Patient encourage to eat a well balance diet         Assessment/Plan   # Detail Type Description   1. 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  2. Assessment Tremor (R25.1).       3. Assessment Body mass index (BMI) 25.0-25.9, adult (W09.81).   Plan Orders Today's instructions / counseling include(s) Lifestyle education regarding diet. Clinical information/comments: Patient encourage to eat a well balance diet.       4. Assessment Elevated blood-pressure reading, w/o diagnosis of htn (R03.0).         Pain Management Plan Pain Scale: 0/10. Method: Numeric Pain Intensity Scale. Location: L side chest. Onset: 06/08/2014. Duration: varies. Quality: discomforting. Pain management follow-up  plan of care: Patient taken medication as prescribed.  Fall Risk Plan The patient has not fallen in the last year.              Provider:  Danae Orleans. Venetia Maxon MD  03/31/2018 04:34 PM Dictation edited by: Oris Drone. Poteat RN    CC Providers: [facility] [address] [place],  [place]  [address]-   Maeola Harman MD  [address] [place], [place] [address]               Electronically signed by Danae Orleans. Venetia Maxon MD on 04/01/2018 06:58 AM

## 2018-04-06 NOTE — Brief Op Note (Signed)
04/06/2018  5:37 PM  PATIENT:  David Rhodes  62 y.o. male  PRE-OPERATIVE DIAGNOSIS: Chest pocket infection   POST-OPERATIVE DIAGNOSIS:  Same  PROCEDURE:  Procedure(s): Revision of left chest implantable pulse generator, extension, and pocket adapter (Left)  SURGEON:  Surgeon(s) and Role:    Maeola Harman, MD - Primary  PHYSICIAN ASSISTANT:   ASSISTANTS: Poteat, RN   ANESTHESIA:   general  EBL:  Minimal  BLOOD ADMINISTERED:none  DRAINS: (10) Jackson-Pratt drain(s) with closed bulb suction in the pocket   LOCAL MEDICATIONS USED:  MARCAINE    and LIDOCAINE   SPECIMEN:  Source of Specimen:  cultures from cranial incision and chest pocket  DISPOSITION OF SPECIMEN:  Microbiology  COUNTS:  YES  TOURNIQUET:  * No tourniquets in log *  DICTATION: DICTATION: Patient has implanted vim stimulator electrodes and IPG, 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  He has been draining from an open wound on his chest for the last four months.  We have elected to cut the extensions at the head and to removed the infected IPG.  PROCEDURE: Patient was brought to the operating room and given general anesthesia.  Cranial connectors were prepped and draped separately.  This area was infiltrated with lidocaine, incision was made, extensions identified and then cut.  Lead covers were kept intact.  Cultures were obtained.  Wound was closed with 2-0 Vicryl sutures and staples.  Left upper chest was prepped with betadine scrub and paint.  Area of planned incision was infiltrated with lidocaine.  Prior incision was reopened and the old IPG was removed.  Open wounds were extended and adaptor and IPG were removed along with extensions.  Cultures were obtained. Wound was irrigated with bacitracin and  with vancomycin. Then irrigated once more.  Incision was closed with 2-0 Vicryl and staples.  A #10 JP drain was placed through a separate stab incision and wounds were dressed with a sterile occlusive dressing.  Counts were  correct at the end of the case.  PLAN OF CARE: Admit to inpatient   PATIENT DISPOSITION:  PACU - hemodynamically stable.   Delay start of Pharmacological VTE agent (>24hrs) due to surgical blood loss or risk of bleeding: yes

## 2018-04-06 NOTE — Progress Notes (Signed)
Pharmacy Antibiotic Note  David Rhodes is a 62 y.o. male admitted on 04/06/2018 with chest pocket infection.  Pharmacy has been consulted for vanc dosing.  Pt was taken to the OR today a revision of his chest implantible pulse generator. He has a chest pocket infection. A JP drain was placed. Stimulator and IPG were removed. He got a dose of vanc pre-op.  Scr 0.86, WBC 9, goal trough 10-15  Plan: Vanc 1g IV q12 Trough as needed  Height: 6' (182.9 cm) Weight: 152 lb 1 oz (69 kg) IBW/kg (Calculated) : 77.6  Temp (24hrs), Avg:97.5 F (36.4 C), Min:97.3 F (36.3 C), Max:97.8 F (36.6 C)  Recent Labs  Lab 04/06/18 1200  WBC 9.0  CREATININE 0.86    Estimated Creatinine Clearance: 86.9 mL/min (by C-G formula based on SCr of 0.86 mg/dL).    Allergies  Allergen Reactions  . Penicillins Shortness Of Breath    PATIENT HAS HAD A PCN REACTION WITH IMMEDIATE RASH, FACIAL/TONGUE/THROAT SWELLING, SOB, OR LIGHTHEADEDNESS WITH HYPOTENSION:  #  #  YES  #  #  Has patient had a PCN reaction causing severe rash involving mucus membranes or skin necrosis: No Has patient had a PCN reaction that required hospitalization No Has patient had a PCN reaction occurring within the last 10 years: No If all of the above answers are "NO", then may proceed with Cephalosporin use.     Antimicrobials this admission: 10/8 vanc >>  10/8 cipro>>  Dose adjustments this admission:   Microbiology results: BCx: UCx:  Sputum:  MRSA PCR: 10/8 deep wound>>

## 2018-04-06 NOTE — Anesthesia Preprocedure Evaluation (Signed)
Anesthesia Evaluation  Patient identified by MRN, date of birth, ID band Patient awake    Reviewed: Allergy & Precautions, NPO status , Patient's Chart, lab work & pertinent test results  Airway Mallampati: II  TM Distance: >3 FB Neck ROM: Full    Dental no notable dental hx.    Pulmonary asthma , COPD,    Pulmonary exam normal breath sounds clear to auscultation       Cardiovascular hypertension, Pt. on medications Normal cardiovascular exam Rhythm:Regular Rate:Normal     Neuro/Psych negative neurological ROS  negative psych ROS   GI/Hepatic negative GI ROS, Neg liver ROS,   Endo/Other  negative endocrine ROS  Renal/GU negative Renal ROS  negative genitourinary   Musculoskeletal negative musculoskeletal ROS (+)   Abdominal   Peds negative pediatric ROS (+)  Hematology negative hematology ROS (+)   Anesthesia Other Findings   Reproductive/Obstetrics negative OB ROS                             Anesthesia Physical  Anesthesia Plan  ASA: III  Anesthesia Plan: General   Post-op Pain Management:    Induction: Intravenous  PONV Risk Score and Plan:   Airway Management Planned: Oral ETT  Additional Equipment:   Intra-op Plan:   Post-operative Plan: Extubation in OR  Informed Consent: I have reviewed the patients History and Physical, chart, labs and discussed the procedure including the risks, benefits and alternatives for the proposed anesthesia with the patient or authorized representative who has indicated his/her understanding and acceptance.   Dental advisory given  Plan Discussed with: CRNA and Surgeon  Anesthesia Plan Comments:         Anesthesia Quick Evaluation

## 2018-04-07 ENCOUNTER — Other Ambulatory Visit: Payer: Self-pay

## 2018-04-07 ENCOUNTER — Inpatient Hospital Stay: Payer: Self-pay

## 2018-04-07 ENCOUNTER — Encounter (HOSPITAL_COMMUNITY): Payer: Self-pay

## 2018-04-07 DIAGNOSIS — F17220 Nicotine dependence, chewing tobacco, uncomplicated: Secondary | ICD-10-CM

## 2018-04-07 DIAGNOSIS — Z880 Allergy status to penicillin: Secondary | ICD-10-CM

## 2018-04-07 DIAGNOSIS — T85731A Infection and inflammatory reaction due to implanted electronic neurostimulator of brain, electrode (lead), initial encounter: Secondary | ICD-10-CM

## 2018-04-07 DIAGNOSIS — B9561 Methicillin susceptible Staphylococcus aureus infection as the cause of diseases classified elsewhere: Secondary | ICD-10-CM

## 2018-04-07 DIAGNOSIS — K5900 Constipation, unspecified: Secondary | ICD-10-CM

## 2018-04-07 DIAGNOSIS — Z950 Presence of cardiac pacemaker: Secondary | ICD-10-CM

## 2018-04-07 MED ORDER — VANCOMYCIN HCL IN DEXTROSE 1-5 GM/200ML-% IV SOLN
1000.0000 mg | Freq: Two times a day (BID) | INTRAVENOUS | Status: DC
  Administered 2018-04-07 – 2018-04-08 (×2): 1000 mg via INTRAVENOUS
  Filled 2018-04-07 (×2): qty 200

## 2018-04-07 MED ORDER — SODIUM CHLORIDE 0.9% FLUSH: 10.0000 mL | INTRAVENOUS | Status: DC | PRN

## 2018-04-07 MED ORDER — VANCOMYCIN HCL IN DEXTROSE 1-5 GM/200ML-% IV SOLN: 1000.0000 mg | Freq: Two times a day (BID) | INTRAVENOUS | Status: DC

## 2018-04-07 NOTE — Consult Note (Signed)
[facility] for Infectious Disease    Date of Admission:  04/06/2018   Total days of antibiotics: 1 vanco/cipro               Reason for Consult: Deep brain stimulator infection    Referring Provider: Vertell Limber   Assessment: Staph aureus deep brain stimulator infection.  Plan: 1. D/c cipro 2. Continue vancomycin til sensitivities available 3. If MSSA, will challenge with cefazolin (prev PEN allergy) in hospital where he can be monitored.  4. Will place PIC, anticipate at least 2 weeks of IV followed by po 5. Have alerted home care/[facility], and pharmacy.   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 amLODipine  10 mg Oral Daily  . baclofen  10 mg Oral TID  . docusate sodium  100 mg Oral BID  . gabapentin  600 mg Oral QID  . lisinopril  40 mg Oral Daily  . loratadine  10 mg Oral Daily  . meloxicam  15 mg Oral Daily  . mometasone-formoterol  2 puff Inhalation BID  . montelukast  10 mg Oral Q2000  . pantoprazole  40 mg Oral Q2000  . rOPINIRole  2 mg Oral BID  . sodium chloride flush  3 mL Intravenous Q12H  . umeclidinium bromide  1 puff Inhalation Daily    HPI: David Rhodes is a 62 y.o. male with hx of tremor who underwent deep brain stimulator placement (11-2006). He had manipulation of this 11-2011 and 03-2014.  He had generator changed 08-2016.  He developed an open wound over this in February 2019. He was told to keep this wound clean, dry and covered. The wound widened and began to drain "pus". He states he was on no anbx during this period and that he had no fever or chills.  He underwent revision of L chest implantable pulse generator, extension and pocket adapter on 10-8.    Review of Systems: Review of Systems  Constitutional: Negative for chills and fever.  Respiratory: Negative for shortness of breath.   Gastrointestinal: Positive for constipation. Negative for diarrhea.    Genitourinary: Negative for dysuria and urgency.  Neurological: Negative for sensory change and focal weakness.  Please see HPI. All other systems reviewed and negative.   Past Medical History:  Diagnosis Date  . Allergy    Hay fever  . Anxiety   . Asthma   . Chronic pain due to trauma   . COPD (chronic obstructive pulmonary disease) ([place])   . Depression   . Essential tremor   . GERD (gastroesophageal reflux disease)   . Headache(784.0)   . Hypertension   . Inguinal hernia    bilateral  . Neuromuscular disorder (H[facility])    hx of tremors  . Pneumonia    many years ago  . Shortness of breath    when allergies flare up  . Urine incontinence   . Wears glasses     Social History   Tobacco Use  . Smoking status: Never Smoker  . Smokeless tobacco: Current User    Types: Snuff  Substance Use Topics  . Alcohol use: No    Alcohol/week: 0.0 standard drinks  . Drug use: No    Family History  Problem Relation Age of Onset  . Myoclonus Mother   . Breast cancer Mother   . Prostate cancer Father   . Heart disease Paternal Grandfather   .  Myoclonus Maternal Uncle      Medications:  Scheduled: . amLODipine  10 mg Oral Daily  . baclofen  10 mg Oral TID  . docusate sodium  100 mg Oral BID  . gabapentin  600 mg Oral QID  . lisinopril  40 mg Oral Daily  . loratadine  10 mg Oral Daily  . meloxicam  15 mg Oral Daily  . mometasone-formoterol  2 puff Inhalation BID  . montelukast  10 mg Oral Q2000  . pantoprazole  40 mg Oral Q2000  . rOPINIRole  2 mg Oral BID  . sodium chloride flush  3 mL Intravenous Q12H  . umeclidinium bromide  1 puff Inhalation Daily    Abtx:  Anti-infectives (From admission, onward)   Start     Dose/Rate Route Frequency Ordered Stop   04/07/18 0400  ciprofloxacin (CIPRO) IVPB 400 mg     400 mg 200 mL/hr over 60 Minutes Intravenous Every 12 hours 04/06/18 2005     04/07/18 0400  vancomycin (VANCOCIN) IVPB 1000 mg/200 mL premix     1,000 mg 200  mL/hr over 60 Minutes Intravenous Every 12 hours 04/06/18 2037     04/06/18 1706  vancomycin (VANCOCIN) powder  Status:  Discontinued       As needed 04/06/18 1707 04/06/18 1744   04/06/18 1400  ciprofloxacin (CIPRO) IVPB 400 mg     400 mg 200 mL/hr over 60 Minutes Intravenous  Once 04/06/18 1345 04/06/18 1626   04/06/18 1342  ciprofloxacin (CIPRO) 400 MG/200ML IVPB    Note to Pharmacy:  Starleen Arms   : cabinet override      04/06/18 1342 04/06/18 1626   04/06/18 1132  vancomycin (VANCOCIN) 1-5 GM/200ML-% IVPB    Note to Pharmacy:  Starleen Arms   : cabinet override      04/06/18 1132 04/06/18 1538   04/06/18 1130  vancomycin (VANCOCIN) IVPB 1000 mg/200 mL premix     1,000 mg 200 mL/hr over 60 Minutes Intravenous On call to O.R. 04/06/18 1126 04/06/18 1538   04/06/18 1130  vancomycin (VANCOCIN) IVPB 1000 mg/200 mL premix  Status:  Discontinued     1,000 mg 200 mL/hr over 60 Minutes Intravenous  Once 04/06/18 1126 04/06/18 1727        OBJECTIVE: Blood pressure (!) 136/91, pulse 81, temperature 98.5 F (36.9 C), temperature source Oral, resp. rate 16, height 6' (1.829 m), weight 69 kg, SpO2 100 %.  Physical Exam  Constitutional: He is oriented to person, place, and time. He appears well-developed and well-nourished.  HENT:  Head:    Mouth/Throat: No oropharyngeal exudate.  Eyes: Pupils are equal, round, and reactive to light. EOM are normal.  Neck: Normal range of motion. Neck supple.  Cardiovascular: Normal rate, regular rhythm and normal heart sounds.  Pulmonary/Chest: Effort normal and breath sounds normal.    Abdominal: Soft. Bowel sounds are normal. He exhibits no distension. There is no tenderness.  Musculoskeletal: He exhibits no edema.  Neurological: He is alert and oriented to person, place, and time. No sensory deficit. He exhibits normal muscle tone. Coordination normal.  Psychiatric: He has a normal mood and affect.    Lab Results Results for orders  placed or performed during the hospital encounter of 04/06/18 (from the past 48 hour(s))  CBC     Status: None   Collection Time: 04/06/18 12:00 PM  Result Value Ref Range   WBC 9.0 4.0 - 10.5 K/uL   RBC 5.10 4.22 - 5.81 MIL/uL  Hemoglobin 14.9 13.0 - 17.0 g/dL   HCT 46.2 39.0 - 52.0 %   MCV 90.6 80.0 - 100.0 fL   MCH 29.2 26.0 - 34.0 pg   MCHC 32.3 30.0 - 36.0 g/dL   RDW 13.0 11.5 - 15.5 %   Platelets 239 150 - 400 K/uL   nRBC 0.0 0.0 - 0.2 %    Comment: Performed at [facility], [place] [address] [place], [place] [address]  Basic metabolic panel     Status: None   Collection Time: 04/06/18 12:00 PM  Result Value Ref Range   Sodium 138 135 - 145 mmol/L   Potassium 3.6 3.5 - 5.1 mmol/L   Chloride 106 98 - 111 mmol/L   CO2 25 22 - 32 mmol/L   Glucose, Bld 85 70 - 99 mg/dL   BUN 9 8 - 23 mg/dL   Creatinine, Ser 0.86 0.61 - 1.24 mg/dL   Calcium 10.3 8.9 - 10.3 mg/dL   GFR calc non Af Amer >60 >60 mL/min   GFR calc Af Amer >60 >60 mL/min    Comment: (NOTE) The eGFR has been calculated using the CKD EPI equation. This calculation has not been validated in all clinical situations. eGFR's persistently <60 mL/min signify possible Chronic Kidney Disease.    Anion gap 7 5 - 15    Comment: Performed at [facility] [address], [place], [place] [address]  Culture, fungus without smear     Status: None (Preliminary result)   Collection Time: 04/06/18  1:21 PM  Result Value Ref Range   Specimen Description WOUND CHEST LEFT    Special Requests NONE    Culture      NO GROWTH < 24 HOURS Performed at [facility], [place] [address] [place], [place] [address]    Report Status PENDING   Aerobic/Anaerobic Culture (surgical/deep wound)     Status: None (Preliminary result)   Collection Time: 04/06/18  1:21 PM  Result Value Ref Range   Specimen Description WOUND CHEST LEFT    Special Requests NONE    Gram Stain      RARE WBC PRESENT,BOTH PMN AND MONONUCLEAR FEW GRAM  POSITIVE COCCI    Culture      FEW STAPHYLOCOCCUS AUREUS SUSCEPTIBILITIES TO FOLLOW Performed at [facility], [place] [address], [place], [place] [address]    Report Status PENDING   Aerobic/Anaerobic Culture (surgical/deep wound)     Status: None (Preliminary result)   Collection Time: 04/06/18  5:10 PM  Result Value Ref Range   Specimen Description TISSUE SOFT CRAINIAL LEADS    Special Requests NONE    Gram Stain NO WBC SEEN NO ORGANISMS SEEN     Culture      NO GROWTH < 24 HOURS Performed at [facility], [place] [address] [place], [place] [address]    Report Status PENDING   Aerobic/Anaerobic Culture (surgical/deep wound)     Status: None (Preliminary result)   Collection Time: 04/06/18  5:17 PM  Result Value Ref Range   Specimen Description TISSUE SOFT POCKET INFECTION    Special Requests NONE    Gram Stain      MODERATE WBC PRESENT, PREDOMINANTLY PMN NO ORGANISMS SEEN    Culture      FEW STAPHYLOCOCCUS AUREUS SUSCEPTIBILITIES TO FOLLOW Performed at [facility], [place] [address] [place],  [address]    Report Status PENDING       Component Value  Date/Time   SDES TISSUE SOFT POCKET INFECTION 04/06/2018 1717   SPECREQUEST NONE 04/06/2018 1717   CULT  04/06/2018 1717    FEW STAPHYLOCOCCUS AUREUS SUSCEPTIBILITIES TO FOLLOW Performed at [facility], [place] [address] [place], [place] [address]    REPTSTATUS PENDING 04/06/2018 1717   No results found. Recent Results (from the past 240 hour(s))  Culture, fungus without smear     Status: None (Preliminary result)   Collection Time: 04/06/18  1:21 PM  Result Value Ref Range Status   Specimen Description WOUND CHEST LEFT  Final   Special Requests NONE  Final   Culture   Final    NO GROWTH < 24 HOURS Performed at [facility], [address], [place], [place] [address]    Report Status PENDING  Incomplete  Aerobic/Anaerobic Culture (surgical/deep wound)     Status: None (Preliminary  result)   Collection Time: 04/06/18  1:21 PM  Result Value Ref Range Status   Specimen Description WOUND CHEST LEFT  Final   Special Requests NONE  Final   Gram Stain   Final    RARE WBC PRESENT,BOTH PMN AND MONONUCLEAR FEW GRAM POSITIVE COCCI    Culture   Final    FEW STAPHYLOCOCCUS AUREUS SUSCEPTIBILITIES TO FOLLOW Performed at [facility], [place] [address] [place], [place] [address]    Report Status PENDING  Incomplete  Aerobic/Anaerobic Culture (surgical/deep wound)     Status: None (Preliminary result)   Collection Time: 04/06/18  5:10 PM  Result Value Ref Range Status   Specimen Description TISSUE SOFT CRAINIAL LEADS  Final   Special Requests NONE  Final   Gram Stain NO WBC SEEN NO ORGANISMS SEEN   Final   Culture   Final    NO GROWTH < 24 HOURS Performed at [facility], [place] [address] [place], [place] [address]    Report Status PENDING  Incomplete  Aerobic/Anaerobic Culture (surgical/deep wound)     Status: None (Preliminary result)   Collection Time: 04/06/18  5:17 PM  Result Value Ref Range Status   Specimen Description TISSUE SOFT POCKET INFECTION  Final   Special Requests NONE  Final   Gram Stain   Final    MODERATE WBC PRESENT, PREDOMINANTLY PMN NO ORGANISMS SEEN    Culture   Final    FEW STAPHYLOCOCCUS AUREUS SUSCEPTIBILITIES TO FOLLOW Performed at [facility], [place] [address], [place], [place] [address]    Report Status PENDING  Incomplete    Microbiology: Recent Results (from the past 240 hour(s))  Culture, fungus without smear     Status: None (Preliminary result)   Collection Time: 04/06/18  1:21 PM  Result Value Ref Range Status   Specimen Description WOUND CHEST LEFT  Final   Special Requests NONE  Final   Culture   Final    NO GROWTH < 24 HOURS Performed at [facility], [place] [address], [place], [place] [address]    Report Status PENDING  Incomplete  Aerobic/Anaerobic Culture (surgical/deep wound)     Status: None  (Preliminary result)   Collection Time: 04/06/18  1:21 PM  Result Value Ref Range Status   Specimen Description WOUND CHEST LEFT  Final   Special Requests NONE  Final   Gram Stain   Final    RARE WBC PRESENT,BOTH PMN AND MONONUCLEAR FEW GRAM POSITIVE COCCI    Culture   Final    FEW STAPHYLOCOCCUS AUREUS SUSCEPTIBILITIES  TO FOLLOW Performed at [facility], [place] [address] [place], [place] [address]    Report Status PENDING  Incomplete  Aerobic/Anaerobic Culture (surgical/deep wound)     Status: None (Preliminary result)   Collection Time: 04/06/18  5:10 PM  Result Value Ref Range Status   Specimen Description TISSUE SOFT CRAINIAL LEADS  Final   Special Requests NONE  Final   Gram Stain NO WBC SEEN NO ORGANISMS SEEN   Final   Culture   Final    NO GROWTH < 24 HOURS Performed at [facility], [place] [address] [place], [place] [address]    Report Status PENDING  Incomplete  Aerobic/Anaerobic Culture (surgical/deep wound)     Status: None (Preliminary result)   Collection Time: 04/06/18  5:17 PM  Result Value Ref Range Status   Specimen Description TISSUE SOFT POCKET INFECTION  Final   Special Requests NONE  Final   Gram Stain   Final    MODERATE WBC PRESENT, PREDOMINANTLY PMN NO ORGANISMS SEEN    Culture   Final    FEW STAPHYLOCOCCUS AUREUS SUSCEPTIBILITIES TO FOLLOW Performed at [facility], [place] [address] [place], [place] [address]    Report Status PENDING  Incomplete    Radiographs and labs were personally reviewed by me.   Bobby Rumpf, MD [facility] [phone] 04/07/2018, 2:53 PM

## 2018-04-07 NOTE — Evaluation (Signed)
Occupational Therapy Evaluation Patient Details Name: David Rhodes MRN: [id number] DOB: [birth year] Today's Date: 04/07/2018    History of Present Illness 62 yo male s/p Revision of left chest implantable pulse generator, extension, and pocket adapter (Left)   Clinical Impression   Patient evaluated by Occupational Therapy with no further acute OT needs identified. All education has been completed and the patient has no further questions. Pt was able to independently place coffee in microwave but declined to lift out of microwave for fear of spilling showing good reasoning. Pt lives alone and does not have a set meal plan. Pt plans to drive home from the hospital. Pt was able to maintain and sustain attempt to driving simulated task for hand placement. However-- DEFER to Pt to make sure that patient can alternate control R LE for gas pedal . Pt advised that JP drain is in place and that MD must write order prior to d/c. Pt states "if he is going to operate ill stay." Pt very very clear that he wants to d/c if operation is not occurring today and pt reports incr anxiety staying acutely. See below for any follow-up Occupational Therapy or equipment needs. OT to sign off. Thank you for referral.      Follow Up Recommendations  No OT follow up    Equipment Recommendations  None recommended by OT    Recommendations for Other Services       Precautions / Restrictions Precautions Precautions: None      Mobility Bed Mobility                  Transfers Overall transfer level: Independent                    Balance                                           ADL either performed or assessed with clinical judgement   ADL Overall ADL's : Independent                                       General ADL Comments: pt able to single leg stand at this time. pt squating over and wiping up the floor . pt shaking liquid out of the cup trying to  drink coffee. pt reports "i use lids" pt reports that speak is mroe diffcult and that hand tremors are worse     Vision         Perception     Praxis      Pertinent Vitals/Pain Pain Assessment: 0-10 Pain Score: 8  Pain Location: L side Pain Descriptors / Indicators: Constant;Operative site guarding Pain Intervention(s): Monitored during session;Premedicated before session;Repositioned     Hand Dominance Right   Extremity/Trunk Assessment Upper Extremity Assessment Upper Extremity Assessment: LUE deficits/detail LUE Deficits / Details: pt with severe pain with shoulder flexion. pt using R UE to avoid L UE use    Lower Extremity Assessment Lower Extremity Assessment: Overall WFL for tasks assessed   Cervical / Trunk Assessment Cervical / Trunk Assessment: Normal   Communication Communication Communication: Expressive difficulties   Cognition Arousal/Alertness: Awake/alert Behavior During Therapy: WFL for tasks assessed/performed Overall Cognitive Status: Within Functional Limits for tasks assessed  General Comments  educated on bathing and avoiding washing on the incision. pt educated on decr risk for infection with clean wash cloth    Exercises     Shoulder Instructions      Home Living Family/patient expects to be discharged to:: Private residence Living Arrangements: Alone   Type of Home: Mobile home Home Access: Stairs to enter     Home Layout: One level     Bathroom Shower/Tub: Chief Strategy Officer: Standard     Home Equipment: None   Additional Comments: pt reports no help and living alone. pt calling therapist back to room stating he can d/c to his parents home if that helps him leave sooner.       Prior Functioning/Environment Level of Independence: Independent        Comments: drives and even drove himself to the hospital        OT Problem List:        OT  Treatment/Interventions:      OT Goals(Current goals can be found in the care plan section) Acute Rehab OT Goals Patient Stated Goal: to leave today  OT Frequency:     Barriers to D/C:            Co-evaluation              AM-PAC PT "6 Clicks" Daily Activity     Outcome Measure Help from another person eating meals?: None Help from another person taking care of personal grooming?: None Help from another person toileting, which includes using toliet, bedpan, or urinal?: None Help from another person bathing (including washing, rinsing, drying)?: None Help from another person to put on and taking off regular upper body clothing?: None Help from another person to put on and taking off regular lower body clothing?: None 6 Click Score: 24   End of Session Nurse Communication: Mobility status;Precautions  Activity Tolerance: Patient tolerated treatment well Patient left: in bed;with call bell/phone within reach  OT Visit Diagnosis: Unsteadiness on feet (R26.81)                Time: 1610-9604 OT Time Calculation (min): 31 min Charges:  OT General Charges $OT Visit: 1 Visit OT Evaluation $OT Eval Moderate Complexity: 1 Mod OT Treatments $Self Care/Home Management : 8-22 mins   Mateo Flow, OTR/L  Acute Rehabilitation Services Pager: [phone] Office: [phone] .   Boone Master B 04/07/2018, 1:39 PM

## 2018-04-07 NOTE — Progress Notes (Addendum)
Subjective: Patient reports "I'm doing ok, just shaking."  Objective: Vital signs in last 24 hours: Temp:  [97.3 F (36.3 C)-98.4 F (36.9 C)] 97.9 F (36.6 C) (10/09 0743) Pulse Rate:  [75-99] 99 (10/09 0743) Resp:  [15-22] 16 (10/09 0743) BP: (153-201)/(69-113) 172/81 (10/09 0743) SpO2:  [96 %-100 %] 98 % (10/09 0743) Weight:  [69 kg] 69 kg (10/08 1130)  Intake/Output from previous day: 10/08 0701 - 10/09 0700 In: 2460 [P.O.:640; I.V.:1020; IV Piggyback:800] Out: 1635 [Urine:1575; Drains:20; Blood:40] Intake/Output this shift: Total I/O In: -  Out: 620 [Urine:600; Drains:20]  Alert, conversant, cooperative. He is disappointed that his deep brain stimulator pulse generator could not be replaced, but verbalizes understanding of the concerns for infection spread.  Tremor is significant, but he is managing with some difficulty. Scalp site with minimal old blood, no erythema or swelling. Left chest drsg intact, without sweeling or drainage at stapled incisions. JP patent, 20ml emptied this morning.   Lab Results: Recent Labs    04/06/18 1200  WBC 9.0  HGB 14.9  HCT 46.2  PLT 239   BMET Recent Labs    04/06/18 1200  NA 138  K 3.6  CL 106  CO2 25  GLUCOSE 85  BUN 9  CREATININE 0.86  CALCIUM 10.3    Studies/Results: No results found.  Assessment/Plan: stable  LOS: 1 day  Discussed with pt plans to remain inpatient for treatment of pocket infection. He reluctantly agrees with the plan. He is ok to mobilize, but will need assistance initially to assess safety now that his tremor is no longer controlled by DBS. Will leave drain in for now.    Georgiann Cocker 04/07/2018, 7:49 AM  Will consult ID regarding course of antibiotics.

## 2018-04-07 NOTE — Progress Notes (Signed)
Pt refused all morning medication, and stated he was not going to take any medications while here in the hospital. MD and MD nurse made aware. Pt stated he wants to take his home medications per pharmacy dispensing; A pharmacist spoke with the patient in person and explained why they were not able dispense his home medications. Pt voiced understanding and still continue to refuse medications. VSS. No complaints voiced.

## 2018-04-07 NOTE — Progress Notes (Signed)
Patient continues to refuse medication by mouth or any pain meds for fear of not being able to go home tomorrow, Thursday.

## 2018-04-07 NOTE — Progress Notes (Signed)
Throughout the night, pt refused all pain meds. Pt believes it will make his hospital stay longer than necessary. RN also offered tylenol but pt still refused. Pt's drain had an output of 20 mL overnight.

## 2018-04-07 NOTE — Progress Notes (Signed)
Peripherally Inserted Central Catheter/Midline Placement  The IV Nurse has discussed with the patient and/or persons authorized to consent for the patient, the purpose of this procedure and the potential benefits and risks involved with this procedure.  The benefits include less needle sticks, lab draws from the catheter, and the patient may be discharged home with the catheter. Risks include, but not limited to, infection, bleeding, blood clot (thrombus formation), and puncture of an artery; nerve damage and irregular heartbeat and possibility to perform a PICC exchange if needed/ordered by physician.  Alternatives to this procedure were also discussed.  Bard Power PICC patient education guide, fact sheet on infection prevention and patient information card has been provided to patient /or left at bedside.    PICC/Midline Placement Documentation  PICC Single Lumen 04/07/18 PICC Right Basilic 39 cm 0 cm (Active)  Indication for Insertion or Continuance of Line Home intravenous therapies (PICC only) 04/07/2018  7:50 PM  Exposed Catheter (cm) 0 cm 04/07/2018  7:50 PM  Site Assessment Clean;Dry;Intact 04/07/2018  7:50 PM  Line Status Blood return noted;Flushed;Saline locked 04/07/2018  7:50 PM  Dressing Type Transparent;Securing device 04/07/2018  7:50 PM  Dressing Status Clean;Dry;Intact;Antimicrobial disc in place 04/07/2018  7:50 PM  Line Adjustment (NICU/IV Team Only) No 04/07/2018  7:50 PM  Dressing Intervention New dressing 04/07/2018  7:50 PM  Dressing Change Due 04/14/18 04/07/2018  7:50 PM       Netta Corrigan L 04/07/2018, 8:07 PM

## 2018-04-07 NOTE — Social Work (Signed)
CSW acknowledging consult for SNF placement. Will follow for therapy recommendations.   Kendrick Remigio H Travez Stancil, LCSWA [place] Clinical Social Work [phone]   

## 2018-04-07 NOTE — Evaluation (Signed)
Physical Therapy Evaluation/Discharge Patient Details Name: David Rhodes MRN: [id number] DOB: 1955-08-31 Today's Date: 04/07/2018   History of Present Illness  62 yo male s/p Revision of left chest implantable pulse generator, extension, and pocket adapter David Rhodes yo male s/p Revision of left chest implantable pulse generator, extension, and pocket adapter (Left) of his deep brain stimulator due to infection on 04/06/18.  Pt with significant PMH of HTN, essential tremor, COPD, chronic pain due to trauma, HA, neuromuscular d/o, urine incontinence, deep brain stimulator placement (original 2008 with multiple battery revisions).    Clinical Impression  Despite some increase in his UE tremors, pt's gait and mobility appear close to his baseline.  He was able to ambulate a good distance into the hallway, do stairs reciprocally, and generally demonstrate safe, independent mobility.  My only concern is that he drove here and he plans to drive home.  PT to sign off as he has no acute PT or f/u PT needs at this time.     Follow Up Recommendations No PT follow up    Equipment Recommendations  None recommended by PT    Recommendations for Other Services   NA    Precautions / Restrictions Precautions Precautions: None Required Braces or Orthoses: Other Brace/Splint Other Brace/Splint: JP drain from head`      Mobility  Bed Mobility Overal bed mobility: Independent                Transfers Overall transfer level: Independent                  Ambulation/Gait Ambulation/Gait assistance: Supervision Gait Distance (Feet): 300 Feet Assistive device: None Gait Pattern/deviations: WFL(Within Functional Limits)     General Gait Details: pt is very fast to move, some mild staggerings, but nothing he had to have external assist to recover from.   Stairs Stairs: Yes Stairs assistance: Supervision Stair Management: One rail Right;Two rails;Forwards Number of Stairs: 10 General  stair comments: Pt is skipping steps using rail, practically running up the stairs.       Balance Overall balance assessment: Needs assistance Sitting-balance support: Feet supported;No upper extremity supported Sitting balance-Leahy Scale: Normal     Standing balance support: No upper extremity supported Standing balance-Leahy Scale: Good                   Standardized Balance Assessment Standardized Balance Assessment : Dynamic Gait Index   Dynamic Gait Index Level Surface: Mild Impairment Change in Gait Speed: Normal Gait with Horizontal Head Turns: Normal Gait with Vertical Head Turns: Normal Gait and Pivot Turn: Normal Step Over Obstacle: Mild Impairment Step Around Obstacles: Normal Steps: Mild Impairment Total Score: 21       Pertinent Vitals/Pain Pain Assessment: Faces Pain Score: 8  Faces Pain Scale: Hurts little more Pain Location: L side head Pain Descriptors / Indicators: Operative site guarding;Grimacing;Guarding Pain Intervention(s): Limited activity within patient's tolerance;Monitored during session;Repositioned    Home Living Family/patient expects to be discharged to:: Private residence Living Arrangements: Alone   Type of Home: Mobile home Home Access: Stairs to enter Entrance Stairs-Rails: Right Entrance Stairs-Number of Steps: 5 Home Layout: One level Home Equipment: None Additional Comments: pt reports no help and living alone. pt calling therapist back to room stating he can d/c to his parents home if that helps him leave sooner.     Prior Function Level of Independence: Independent         Comments: drives and even drove himself to  the hospital     Hand Dominance   Dominant Hand: Right    Extremity/Trunk Assessment   Upper Extremity Assessment Upper Extremity Assessment: Defer to OT evaluation LUE Deficits / Details: pt with severe pain with shoulder flexion. pt using R UE to avoid L UE use     Lower Extremity  Assessment Lower Extremity Assessment: Overall WFL for tasks assessed    Cervical / Trunk Assessment Cervical / Trunk Assessment: Normal  Communication   Communication: Expressive difficulties(baseline deficits, I believe, but coherant)  Cognition Arousal/Alertness: Awake/alert Behavior During Therapy: Impulsive Overall Cognitive Status: Within Functional Limits for tasks assessed                                 General Comments: Pt is a bit impulsive,  decreased safety awareness, but I believe his current presentation is likely baseline.       General Comments General comments (skin integrity, edema, etc.): Simulated pressing the gas and the break in his car (of note, he drove here and plans to drive himself home), he gasses with his right leg and breaks with his left leg.         Assessment/Plan    PT Assessment Patent does not need any further PT services         PT Goals (Current goals can be found in the Care Plan section)  Acute Rehab PT Goals Patient Stated Goal: to leave today PT Goal Formulation: All assessment and education complete, DC therapy               AM-PAC PT "6 Clicks" Daily Activity  Outcome Measure Difficulty turning over in bed (including adjusting bedclothes, sheets and blankets)?: None Difficulty moving from lying on back to sitting on the side of the bed? : None Difficulty sitting down on and standing up from a chair with arms (e.g., wheelchair, bedside commode, etc,.)?: None Help needed moving to and from a bed to chair (including a wheelchair)?: None Help needed walking in hospital room?: None Help needed climbing 3-5 steps with a railing? : None 6 Click Score: 24    End of Session Equipment Utilized During Treatment: Gait belt Activity Tolerance: Patient tolerated treatment well Patient left: in bed;with call bell/phone within reach   PT Visit Diagnosis: Other symptoms and signs involving the nervous system (Z61.096)     Time: 0454-0981 PT Time Calculation (min) (ACUTE ONLY): 11 min   Charges:       [name] B. George Haggart, PT, DPT  Acute Rehabilitation #[phone] pager #[phone] 2019 office   PT Evaluation $PT Eval Moderate Complexity: 1 Mod         04/07/2018, 4:12 PM

## 2018-04-08 DIAGNOSIS — T85731D Infection and inflammatory reaction due to implanted electronic neurostimulator of brain, electrode (lead), subsequent encounter: Secondary | ICD-10-CM

## 2018-04-08 DIAGNOSIS — A4901 Methicillin susceptible Staphylococcus aureus infection, unspecified site: Secondary | ICD-10-CM

## 2018-04-08 DIAGNOSIS — R251 Tremor, unspecified: Secondary | ICD-10-CM

## 2018-04-08 MED ORDER — CEFAZOLIN SODIUM-DEXTROSE 2-4 GM/100ML-% IV SOLN: 2.0000 g | Freq: Three times a day (TID) | INTRAVENOUS | Status: DC

## 2018-04-08 MED ORDER — RIFAMPIN 300 MG PO CAPS: 300.0000 mg | ORAL_CAPSULE | Freq: Two times a day (BID) | ORAL | 0 refills | Status: DC

## 2018-04-08 MED ORDER — RIFAMPIN 300 MG PO CAPS: 300.0000 mg | ORAL_CAPSULE | Freq: Two times a day (BID) | ORAL | Status: DC

## 2018-04-08 MED ORDER — CEFAZOLIN SODIUM-DEXTROSE 2-4 GM/100ML-% IV SOLN
2.0000 g | Freq: Three times a day (TID) | INTRAVENOUS | Status: DC
  Administered 2018-04-08: 2 g via INTRAVENOUS
  Filled 2018-04-08 (×2): qty 100

## 2018-04-08 MED ORDER — RIFAMPIN 300 MG PO CAPS
300.0000 mg | ORAL_CAPSULE | Freq: Two times a day (BID) | ORAL | Status: DC
  Administered 2018-04-08: 300 mg via ORAL
  Filled 2018-04-08 (×2): qty 1

## 2018-04-08 MED ORDER — VANCOMYCIN HCL IN DEXTROSE 1-5 GM/200ML-% IV SOLN: 1000.0000 mg | Freq: Two times a day (BID) | INTRAVENOUS | 0 refills | Status: DC

## 2018-04-08 MED ORDER — CEFAZOLIN SODIUM-DEXTROSE 2-4 GM/100ML-% IV SOLN: 2.0000 g | Freq: Three times a day (TID) | INTRAVENOUS | 0 refills | Status: DC

## 2018-04-08 MED ORDER — CEFAZOLIN IV (FOR PTA / DISCHARGE USE ONLY): 2.0000 g | Freq: Three times a day (TID) | INTRAVENOUS | 0 refills | Status: AC

## 2018-04-08 MED ORDER — FAMOTIDINE 20 MG PO TABS: 20.0000 mg | ORAL_TABLET | Freq: Two times a day (BID) | ORAL | Status: DC

## 2018-04-08 NOTE — Progress Notes (Signed)
Pt with d/c orders. Iv removed. PICC in place for [facility] antibiotics. Discharge paperwork reviewed with pt. All questions answered. Pt with discharge paperwork and prescriptions in hand at time of d/c. Pt escorted to car.

## 2018-04-08 NOTE — Discharge Summary (Signed)
Physician Discharge Summary  Patient ID: David Rhodes MRN: [id number] DOB/AGE: [birth year] 62 y.o.  Admit date: 04/06/2018 Discharge date: 04/08/2018  Admission Diagnoses: Chest pocket infection    Discharge Diagnoses: Chest pocket infection  S/p Revision of left chest implantable pulse generator, extension, and pocket adapter (Left)     Active Problems:   Infection of pacemaker pocket [facility])   Discharged Condition: good  Hospital Course: David Rhodes was admitted for surgery for revision of left chest generator and Deep Brain Stimulator extensions. He recovered nicely and transferred to 4NP for nursing care and IVAB. He is progressing nicely.  Consults: ID  Significant Diagnostic Studies: Wound cultures, awaiting sensitivities  Treatments: surgery: Revision of left chest implantable pulse generator, extension, and pocket adapter (Left)     Discharge Exam: Blood pressure (!) 131/95, pulse 78, temperature 97.7 F (36.5 C), temperature source Oral, resp. rate 16, height 6' (1.829 m), weight 69 kg, SpO2 99 %. Alert, conversant, without complaint. Hopeful of d/c to home today. Verbalizes understanding of need for IVAB via PIC and assures me he will have assistance to manage self admin safely.   He is mobilizing well. BUE tremor is present. Left scalp incision without erythema or drainage; well-approximated with staples. (Will leave open to air) Left chest JP with minimal drainage last 24hrs. Incisions without erythema or swelling. Well-approximated with staples. Pulling JP reveals no bleeding or other drainage. (DSD for the next 3 days)     Disposition:  Discharge to home with IV Vancomycin 1000mg   q12hrs IV x at least 2 weeks, pending sensitivities [facility] for teaching and Mgt). Appreciate Dr. Moshe Cipro assistance.  ID Plan:  Assessment: Staph aureus deep brain stimulator infection.  Plan: 1. D/c cipro 2. Continue vancomycin til sensitivities available 3. If MSSA, will  challenge with cefazolin (prev PEN allergy) in hospital where he can be monitored.  4. Will place PIC, anticipate at least 2 weeks of IV followed by po 5. Have alerted home care/[facility], and pharmacy.    From NS perspective, Staples can be removed in 10-14 days by home health RN or office visit. Pt may shower, with special attn to incision and PIC sites. Ok to remove chest drsg in 3 days. Pt will call with report of erythema or drainage. Office f/u in 3 weeks.      Allergies as of 04/08/2018      Reactions   Penicillins Shortness Of Breath   PATIENT HAS HAD A PCN REACTION WITH IMMEDIATE RASH, FACIAL/TONGUE/THROAT SWELLING, SOB, OR LIGHTHEADEDNESS WITH HYPOTENSION:  #  #  YES  #  #  Has patient had a PCN reaction causing severe rash involving mucus membranes or skin necrosis: No Has patient had a PCN reaction that required hospitalization No Has patient had a PCN reaction occurring within the last 10 years: No If all of the above answers are "NO", then may proceed with Cephalosporin use.      Medication List    TAKE these medications   albuterol (2.5 MG/3ML) 0.083% nebulizer solution Commonly known as:  PROVENTIL Inhale 2.5 mg into the lungs every 6 (six) hours as needed for wheezing.   albuterol 108 (90 Base) MCG/ACT inhaler Commonly known as:  PROVENTIL HFA;VENTOLIN HFA INHALE 1 TO 2 PUFFS BY MOUTH EVERY 6 HOURS AS NEEDED FOR WHEEZING OR SHORTNESS OF BREATH   amLODipine 10 MG tablet Commonly known as:  NORVASC TAKE 1 TABLET(10 MG) BY MOUTH DAILY   baclofen 10 MG tablet Commonly known as:  LIORESAL Take 10 mg by mouth 3 (three) times daily.   Fluticasone-Salmeterol 250-50 MCG/DOSE Aepb Commonly known as:  ADVAIR Inhale 1 puff into the lungs 2 (two) times daily.   gabapentin 600 MG tablet Commonly known as:  NEURONTIN Take 600 mg by mouth 4 (four) times daily.   lisinopril 40 MG tablet Commonly known as:  PRINIVIL,ZESTRIL Take 40 mg by mouth daily.   loratadine 10 MG  tablet Commonly known as:  CLARITIN Take 1 tablet (10 mg total) by mouth daily.   meloxicam 15 MG tablet Commonly known as:  MOBIC Take 15 mg by mouth daily.   montelukast 10 MG tablet Commonly known as:  SINGULAIR Take 10 mg by mouth daily.   omeprazole 20 MG capsule Commonly known as:  PRILOSEC Take 20 mg by mouth daily.   rOPINIRole 2 MG tablet Commonly known as:  REQUIP Take 2 mg by mouth 2 (two) times daily.   SPIRIVA HANDIHALER 18 MCG inhalation capsule Generic drug:  tiotropium Place 18 mcg into inhaler and inhale daily.   vancomycin 1-5 GM/200ML-% Soln Commonly known as:  VANCOCIN Inject 200 mLs (1,000 mg total) into the vein every 12 (twelve) hours.        Signed: Georgiann Cocker 04/08/2018, 8:10 AM   Discharge home on IV antibiotics

## 2018-04-08 NOTE — Progress Notes (Signed)
PHARMACY CONSULT NOTE FOR:  OUTPATIENT  PARENTERAL ANTIBIOTIC THERAPY (OPAT)  Indication: Deep brain stimulator pocket infection Regimen: Cefazolin 2g IV q8h  End date: 04/20/18  IV antibiotic discharge orders are pended. To discharging provider:  please sign these orders via discharge navigator,  Select New Orders & click on the button choice - Manage This Unsigned Work.    Thank you for allowing pharmacy to be a part of this patient's care.  Roderic Scarce Zigmund Daniel, PharmD PGY2 Infectious Diseases Pharmacy Resident Phone: [phone] 04/08/2018, 11:30 AM

## 2018-04-08 NOTE — Progress Notes (Addendum)
Subjective: Patient reports "Can I go home??"  Objective: Vital signs in last 24 hours: Temp:  [97.7 F (36.5 C)-98.5 F (36.9 C)] 97.7 F (36.5 C) (10/10 0300) Pulse Rate:  [68-81] 78 (10/10 0300) BP: (122-145)/(72-99) 131/95 (10/10 0300) SpO2:  [93 %-100 %] 99 % (10/10 0300)  Intake/Output from previous day: 10/09 0701 - 10/10 0700 In: 1720 [P.O.:1320; IV Piggyback:400] Out: 1225 [Urine:1200; Drains:25] Intake/Output this shift: No intake/output data recorded.  Alert, conversant, without complaint. Hopeful of d/c to home today. Verbalizes understanding of need for IVAB via PIC and assures me he will have assistance to manage self admin safely.   He is mobilizing well. BUE tremor is present. Left scalp incision without erythema or drainage; well-approximated with staples. (Will leave open to air) Left chest JP with minimal drainage last 24hrs. Incisions without erythema or swelling. Well-approximated with staples. Pulling JP reveals no bleeding or other drainage. (DSD for the next 3 days)  Lab Results: Recent Labs    04/06/18 1200  WBC 9.0  HGB 14.9  HCT 46.2  PLT 239   BMET Recent Labs    04/06/18 1200  NA 138  K 3.6  CL 106  CO2 25  GLUCOSE 85  BUN 9  CREATININE 0.86  CALCIUM 10.3    Studies/Results: [place] Ekg Site Rite  Result Date: 04/07/2018 If Site Rite image not attached, placement could not be confirmed due to current cardiac rhythm.   Assessment/Plan: Improving  LOS: 2 days  Per DrStern, d/c to home with IV Vancomycin [facility] for teaching and Mgt). Appreciate Dr. Moshe Cipro assistance.  ID Plan:  Assessment: Staph aureus deep brain stimulator infection.   Plan: 1. D/c cipro 2. Continue vancomycin til sensitivities available 3. If MSSA, will challenge with cefazolin (prev PEN allergy) in hospital where he can be monitored.  4. Will place PIC, anticipate at least 2 weeks of IV followed by po 5. Have alerted home care/[facility], and pharmacy.    From our perspective, Cherlynn Polo can be removed in 10-14 days by home health RN or office visit. Pt may shower, with special attn to incision and PIC sites. Ok to remove chest drsg in 3 days. Pt will call with report of erythema or drainage. Office f/u in 3 weeks.      Georgiann Cocker 04/08/2018, 8:01 AM   Patient is doing well.  Discharge on home antibiotics.

## 2018-04-08 NOTE — Anesthesia Postprocedure Evaluation (Signed)
Anesthesia Post Note  Patient: ADRIELL POLANSKY  Procedure(s) Performed: Revision of left chest implantable pulse generator, extension, and pocket adapter (Left Chest)     Patient location during evaluation: PACU Anesthesia Type: General Level of consciousness: awake and alert Pain management: pain level controlled Vital Signs Assessment: post-procedure vital signs reviewed and stable Respiratory status: spontaneous breathing, nonlabored ventilation, respiratory function stable and patient connected to nasal cannula oxygen Cardiovascular status: blood pressure returned to baseline and stable Postop Assessment: no apparent nausea or vomiting Anesthetic complications: no    Last Vitals:  Vitals:   04/07/18 2300 04/08/18 0300  BP: 122/72 (!) 131/95  Pulse: 68 78  Resp:    Temp: 36.8 C 36.5 C  SpO2: 93% 99%    Last Pain:  Vitals:   04/08/18 0300  TempSrc: Oral  PainSc:                  Aaliyha Mumford S

## 2018-04-08 NOTE — Progress Notes (Signed)
[facility] Infusion Coordinator met with pt to educate "hands on" regarding IV ABX administration in preparation for DC to home. Pt very determined to learn.  Hands quite tremulous but pt did well to maintain aseptic technique.  Pt slow to learn and will need to ensure CG will assist at least initially in the home for safe DC. Discussed this with pt and he agreed to contact Nyoka Cowden, friend, to be at the home on Thursday.  [facility] is prepared for DC on Thursday for midday dose at home with [facility] RN support and continued teaching once CG is confirmed to agree to support care at home.    If patient discharges after hours, please call [phone].   Larry Sierras 04/08/2018, 7:04 AM

## 2018-04-08 NOTE — Care Management Note (Signed)
Case Management Note  Patient Details  Name: David Rhodes MRN: [id number] Date of Birth: 21-May-1956  Subjective/Objective:  62 yo male s/p Revision of left chest implantable pulse generator, extension, and pocket adapter (Left).  PTA, pt independent, lives at home alone.                    Action/Plan: Pt requires IV antibiotics at home at dc.  Antibiotic changed to Ancef today; pt to receive first dose here.  Pt refuses to stay for evening dose of Ancef, and understands that [facility] will be out to see him first thing in AM for IV infusion and teaching with his friend, Santina Evans, if possible.  He states he will call her to see if she can come at this time.  Stressed importance of having caregiver be present when nurse comes out, and pt verbalizes understanding.      Expected Discharge Date:  04/08/18               Expected Discharge Plan:  Home w Home Health Services  In-House Referral:     Discharge planning Services  CM Consult  Post Acute Care Choice:  Home Health Choice offered to:  Patient  DME Arranged:  IV pump/equipment DME Agency:  [facility]  HH Arranged:  RN [facility] Agency:  [facility]  Status of Service:  Completed, signed off  If discussed at Long Length of Stay Meetings, dates discussed:    Additional Comments:  Quintella Baton, RN, BSN  Trauma/Neuro ICU Case Manager [phone]

## 2018-04-08 NOTE — Progress Notes (Signed)
[facility]  Date of Admission:  04/06/2018   Total days of antibiotics 3         Day 3 vancomycin            Patient ID: David Rhodes is a 62 y.o. male   Active Problems:   Infection of pacemaker pocket (HCC)   Staph aureus infection   . amLODipine  10 mg Oral Daily  . baclofen  10 mg Oral TID  . docusate sodium  100 mg Oral BID  . gabapentin  600 mg Oral QID  . lisinopril  40 mg Oral Daily  . loratadine  10 mg Oral Daily  . meloxicam  15 mg Oral Daily  . mometasone-formoterol  2 puff Inhalation BID  . montelukast  10 mg Oral Q2000  . pantoprazole  40 mg Oral Q2000  . rOPINIRole  2 mg Oral BID  . sodium chloride flush  3 mL Intravenous Q12H  . umeclidinium bromide  1 puff Inhalation Daily    SUBJECTIVE: Ready to go home. No complaints otherwise aside from needing help to get to the bathroom.   Allergies  Allergen Reactions  . Penicillins Shortness Of Breath    PATIENT HAS HAD A PCN REACTION WITH IMMEDIATE RASH, FACIAL/TONGUE/THROAT SWELLING, SOB, OR LIGHTHEADEDNESS WITH HYPOTENSION:  #  #  YES  #  #  Has patient had a PCN reaction causing severe rash involving mucus membranes or skin necrosis: No Has patient had a PCN reaction that required hospitalization No Has patient had a PCN reaction occurring within the last 10 years: No If all of the above answers are "NO", then may proceed with Cephalosporin use.     OBJECTIVE: Vitals:   04/07/18 2300 04/08/18 0300 04/08/18 0842 04/08/18 0845  BP: 122/72 (!) 131/95    Pulse: 68 78    Resp:      Temp: 98.2 F (36.8 C) 97.7 F (36.5 C) 97.7 F (36.5 C)   TempSrc: Oral Oral Oral   SpO2: 93% 99%  100%  Weight:      Height:       Body mass index is 20.62 kg/m.  Physical Exam  Constitutional: He is oriented to person, place, and time. He appears well-developed and well-nourished.  HENT:  Mouth/Throat: Oropharynx is clear and moist. No oral lesions. Normal dentition. No  dental caries. No oropharyngeal exudate.  Eyes: No scleral icterus.  Cardiovascular: Normal rate, regular rhythm and normal heart sounds.  Pulmonary/Chest: Effort normal and breath sounds normal.  Abdominal: Soft. He exhibits no distension. There is no tenderness.  Lymphadenopathy:    He has no cervical adenopathy.  Neurological: He is alert and oriented to person, place, and time.  Baseline tremor   Skin: Skin is warm and dry. No rash noted.  Vitals reviewed.   Lab Results Lab Results  Component Value Date   WBC 9.0 04/06/2018   HGB 14.9 04/06/2018   HCT 46.2 04/06/2018   MCV 90.6 04/06/2018   PLT 239 04/06/2018    Lab Results  Component Value Date   CREATININE 0.86 04/06/2018   BUN 9 04/06/2018   NA 138 04/06/2018   K 3.6 04/06/2018   CL 106 04/06/2018   CO2 25 04/06/2018    Lab Results  Component Value Date   ALT 16 (L) 06/23/2016   AST 26 06/23/2016   ALKPHOS 78 06/23/2016   BILITOT 0.1 (L) 06/23/2016  Microbiology: Recent Results (from the past 240 hour(s))  Culture, fungus without smear     Status: None (Preliminary result)   Collection Time: 04/06/18  1:21 PM  Result Value Ref Range Status   Specimen Description WOUND CHEST LEFT  Final   Special Requests NONE  Final   Culture   Final    NO GROWTH < 24 HOURS Performed at [facility], [address] [place], [place] [address]    Report Status PENDING  Incomplete  Aerobic/Anaerobic Culture (surgical/deep wound)     Status: None (Preliminary result)   Collection Time: 04/06/18  1:21 PM  Result Value Ref Range Status   Specimen Description WOUND CHEST LEFT  Final   Special Requests NONE  Final   Gram Stain   Final    RARE WBC PRESENT,BOTH PMN AND MONONUCLEAR FEW GRAM POSITIVE COCCI    Culture   Final    FEW STAPHYLOCOCCUS AUREUS SUSCEPTIBILITIES TO FOLLOW    Report Status PENDING  Incomplete   Organism ID, Bacteria STAPHYLOCOCCUS AUREUS  Final      Susceptibility   Staphylococcus aureus  - MIC*    CIPROFLOXACIN <=0.5 SENSITIVE Sensitive     ERYTHROMYCIN <=0.25 SENSITIVE Sensitive     GENTAMICIN <=0.5 SENSITIVE Sensitive     OXACILLIN <=0.25 SENSITIVE Sensitive     TETRACYCLINE <=1 SENSITIVE Sensitive     VANCOMYCIN 1 SENSITIVE Sensitive     TRIMETH/SULFA <=10 SENSITIVE Sensitive     CLINDAMYCIN <=0.25 SENSITIVE Sensitive     RIFAMPIN <=0.5 SENSITIVE Sensitive     Inducible Clindamycin Value in next row Sensitive      NEGATIVEPerformed at [facility], [address] [place], [place] [address]    * FEW STAPHYLOCOCCUS AUREUS  Aerobic/Anaerobic Culture (surgical/deep wound)     Status: None (Preliminary result)   Collection Time: 04/06/18  5:10 PM  Result Value Ref Range Status   Specimen Description TISSUE SOFT CRAINIAL LEADS  Final   Special Requests NONE  Final   Gram Stain NO WBC SEEN NO ORGANISMS SEEN   Final   Culture   Final    NO GROWTH < 24 HOURS Performed at [facility], [address] [place], [place] [address]    Report Status PENDING  Incomplete  Aerobic/Anaerobic Culture (surgical/deep wound)     Status: None (Preliminary result)   Collection Time: 04/06/18  5:17 PM  Result Value Ref Range Status   Specimen Description TISSUE SOFT POCKET INFECTION  Final   Special Requests NONE  Final   Gram Stain   Final    MODERATE WBC PRESENT, PREDOMINANTLY PMN NO ORGANISMS SEEN    Culture   Final    FEW STAPHYLOCOCCUS AUREUS CULTURE REINCUBATED FOR BETTER GROWTH Performed at [facility], [address], [place], [place] [address]    Report Status PENDING  Incomplete   Organism ID, Bacteria STAPHYLOCOCCUS AUREUS  Final      Susceptibility   Staphylococcus aureus - MIC*    CIPROFLOXACIN 1 SENSITIVE Sensitive     ERYTHROMYCIN 0.5 SENSITIVE Sensitive     GENTAMICIN <=0.5 SENSITIVE Sensitive     OXACILLIN <=0.25 SENSITIVE Sensitive     TETRACYCLINE <=1 SENSITIVE Sensitive     VANCOMYCIN 1 SENSITIVE Sensitive     TRIMETH/SULFA <=10  SENSITIVE Sensitive     CLINDAMYCIN <=0.25 SENSITIVE Sensitive     RIFAMPIN <=0.5 SENSITIVE Sensitive     Inducible Clindamycin NEGATIVE Sensitive     *  FEW STAPHYLOCOCCUS AUREUS    ASSESSMENT: CHEVON LAUFER is a 62 y.o. male with MSSA infection involving brain stimulator pocket. There is some residual material which will require addition of rifampin 300 mg BID with food with retained fragments. Discussed/counseled on orange effect to urine/tears/saliva. Will give a dose of cefazolin now while inpatient to monitor with h/o PCN allergic reaction then ready to discharge home. There is concern with regards to ability of him to be compliant with administration of TID medication - may need to consider ceftriaxone therapy for him. Will discuss with Dr. Ninetta Lights.   DDI with rifampin reviewed with ID pharmacy team - will change his PPI to H2 blocker. Otherwise no significant interaction.    PLAN: 1. 14 days of IV cefazolin + oral rifampin  2. Will need to give first dose of cefazolin in house with allergy history for close monitoring 3. OPAT consult placed and orders below  4. Follow up with Dr. Ninetta Lights or myself at completion of therapy to transition to oral therapy   OPAT ORDERS:  Diagnosis: Deep brain stimulator pocket infection   Culture Result: MSSA  Allergies  Allergen Reactions  . Penicillins Shortness Of Breath    PATIENT HAS HAD A PCN REACTION WITH IMMEDIATE RASH, FACIAL/TONGUE/THROAT SWELLING, SOB, OR LIGHTHEADEDNESS WITH HYPOTENSION:  #  #  YES  #  #  Has patient had a PCN reaction causing severe rash involving mucus membranes or skin necrosis: No Has patient had a PCN reaction that required hospitalization No Has patient had a PCN reaction occurring within the last 10 years: No If all of the above answers are "NO", then may proceed with Cephalosporin use.     Discharge antibiotics: Cefazolin 2gm IV TID   + Rifampin 300 mg BID   Duration: 14d   End  Date: 04/20/18  [facility] Care and Maintenance Per Protocol __ Please pull PIC at completion of IV antibiotics _x_ Please leave PIC in place until doctor has seen patient or been notified  Labs weekly while on IV antibiotics: _x_ CBC with differential _x_ CMP   Fax weekly labs to [phone]  Clinic Follow Up Appt: 10/18 @ 11:00 with Dr. Ninetta Lights @ [facility]   Rexene Alberts, MSN, NP-C [facility] Cell: [phone] Pager: [phone]  04/08/2018  11:20 AM

## 2018-04-08 NOTE — Discharge Summary (Signed)
Physician Discharge Summary  Patient ID: David Rhodes MRN: [id number] DOB/AGE: [birth year] 62 y.o.  Admit date: 04/06/2018 Discharge date: 04/08/2018  Admission Diagnoses: Chest pocket infection    Discharge Diagnoses: Chest pocket infection  S/p Revision of left chest implantable pulse generator, extension, and pocket adapter (Left)    Active Problems:   Infection of pacemaker pocket [facility])   Discharged Condition: good  Hospital Course: David Rhodes was admitted for surgery for revision of left chest generator and Deep Brain Stimulator extensions. He recovered nicely and transferred to [facility] for nursing care and IVAB. He is progressing nicely.  Consults: ID  Significant Diagnostic Studies: Wound cultures, awaiting sensitivities  Treatments: surgery: Revision of left chest implantable pulse generator, extension, and pocket adapter (Left)    Discharge Exam: Blood pressure (!) 131/95, pulse 78, temperature 97.7 F (36.5 C), temperature source Oral, resp. rate 16, height 6' (1.829 m), weight 69 kg, SpO2 99 %. Alert, conversant, without complaint. Hopeful of d/c to home today. Verbalizes understanding of need for IVAB via PIC and assures me he will have assistance to manage self admin safely.  He is mobilizing well. BUE tremor is present. Left scalp incision without erythema or drainage; well-approximated with staples. (Will leave open to air) Left chest JP with minimal drainage last 24hrs. Incisions without erythema or swelling. Well-approximated with staples. Pulling JP reveals no bleeding or other drainage. (DSD for the next 3 days)    Disposition:  Discharge to home with IV Ancef 2 gms  8 Q 8 Hours 2 weeks,and Rifampin 300mg  Q 12 Hours for two weeks [facility] for teaching and Mgt). Appreciate Dr. Moshe Cipro assistance.  ID Plan: Assessment: Staph aureus deep brain stimulator infection. Plan: 1. D/c cipro 2. Continue vancomycin til sensitivities  available 3. If MSSA, will challenge with cefazolin (prev PEN allergy) in hospital where he can be monitored.  4. Will place PIC, anticipate at least 2 weeks of IV followed by po 5. Have alerted home care/[facility], and pharmacy.  From NS perspective, Staples can be removed in 10-14 days by home health RN or office visit. Pt may shower, with special attn to incision and PIC sites. Ok to remove chest drsg in 3 days. Pt will call with report of erythema or drainage. Office f/u in 3 weeks.    Discharge Instructions    Home infusion instructions [facility] May follow [facility] Pharmacy Dosing Protocol; May administer Cathflo as needed to maintain patency of vascular access device.; Flushing of vascular access device: per [facility] Protocol: 0.9% NaCl pre/post medica...   Complete by:  As directed    Instructions:  May follow [facility] Pharmacy Dosing Protocol   Instructions:  May administer Cathflo as needed to maintain patency of vascular access device.   Instructions:  Flushing of vascular access device: per [facility] Protocol: 0.9% NaCl pre/post medication administration and prn patency; Heparin 100 u/ml, 5ml for implanted ports and Heparin 10u/ml, 5ml for all other central venous catheters.   Instructions:  May follow [facility] Anaphylaxis Protocol for First Dose Administration in the home: 0.9% NaCl at 25-50 ml/hr to maintain IV access for protocol meds. Epinephrine 0.3 ml IV/IM PRN and Benadryl 25-50 IV/IM PRN s/s of anaphylaxis.   Instructions:  [facility] Infusion Coordinator (RN) to assist per patient IV care needs in the home PRN.     Allergies as of 04/08/2018      Reactions   Penicillins Shortness Of Breath   PATIENT HAS HAD A PCN REACTION WITH  IMMEDIATE RASH, FACIAL/TONGUE/THROAT SWELLING, SOB, OR LIGHTHEADEDNESS WITH HYPOTENSION:  #  #  YES  #  #  Has patient had a PCN reaction causing severe rash involving mucus membranes or skin necrosis: No Has patient had a PCN reaction that required  hospitalization No Has patient had a PCN reaction occurring within the last 10 years: No If all of the above answers are "NO", then may proceed with Cephalosporin use.      Medication List    TAKE these medications   albuterol (2.5 MG/3ML) 0.083% nebulizer solution Commonly known as:  PROVENTIL Inhale 2.5 mg into the lungs every 6 (six) hours as needed for wheezing.   albuterol 108 (90 Base) MCG/ACT inhaler Commonly known as:  PROVENTIL HFA;VENTOLIN HFA INHALE 1 TO 2 PUFFS BY MOUTH EVERY 6 HOURS AS NEEDED FOR WHEEZING OR SHORTNESS OF BREATH   amLODipine 10 MG tablet Commonly known as:  NORVASC TAKE 1 TABLET(10 MG) BY MOUTH DAILY   baclofen 10 MG tablet Commonly known as:  LIORESAL Take 10 mg by mouth 3 (three) times daily.   ceFAZolin  IVPB Commonly known as:  ANCEF Inject 2 g into the vein every 8 (eight) hours for 12 days. Indication:  Deep brain stimulator pocket infection Last Day of Therapy:  04/20/18 Labs - Once weekly:  CBC/D and CMP   ceFAZolin 2-4 GM/100ML-% IVPB Commonly known as:  ANCEF Inject 100 mLs (2 g total) into the vein every 8 (eight) hours for 14 days.   Fluticasone-Salmeterol 250-50 MCG/DOSE Aepb Commonly known as:  ADVAIR Inhale 1 puff into the lungs 2 (two) times daily.   gabapentin 600 MG tablet Commonly known as:  NEURONTIN Take 600 mg by mouth 4 (four) times daily.   lisinopril 40 MG tablet Commonly known as:  PRINIVIL,ZESTRIL Take 40 mg by mouth daily.   loratadine 10 MG tablet Commonly known as:  CLARITIN Take 1 tablet (10 mg total) by mouth daily.   meloxicam 15 MG tablet Commonly known as:  MOBIC Take 15 mg by mouth daily.   montelukast 10 MG tablet Commonly known as:  SINGULAIR Take 10 mg by mouth daily.   omeprazole 20 MG capsule Commonly known as:  PRILOSEC Take 20 mg by mouth daily.   rifampin 300 MG capsule Commonly known as:  RIFADIN Take 1 capsule (300 mg total) by mouth every 12 (twelve) hours for 14 days.    rOPINIRole 2 MG tablet Commonly known as:  REQUIP Take 2 mg by mouth 2 (two) times daily.   SPIRIVA HANDIHALER 18 MCG inhalation capsule Generic drug:  tiotropium Place 18 mcg into inhaler and inhale daily.            Home Infusion Instuctions  (From admission, onward)         Start     Ordered   04/08/18 0000  Home infusion instructions [facility] May follow [facility] Pharmacy Dosing Protocol; May administer Cathflo as needed to maintain patency of vascular access device.; Flushing of vascular access device: per [facility] Protocol: 0.9% NaCl pre/post medica...    Question Answer Comment  Instructions May follow [facility] Pharmacy Dosing Protocol   Instructions May administer Cathflo as needed to maintain patency of vascular access device.   Instructions Flushing of vascular access device: per [facility] Protocol: 0.9% NaCl pre/post medication administration and prn patency; Heparin 100 u/ml, 5ml for implanted ports and Heparin 10u/ml, 5ml for all other central venous catheters.   Instructions May follow [facility] Anaphylaxis Protocol for First  Dose Administration in the home: 0.9% NaCl at 25-50 ml/hr to maintain IV access for protocol meds. Epinephrine 0.3 ml IV/IM PRN and Benadryl 25-50 IV/IM PRN s/s of anaphylaxis.   Instructions [facility] Infusion Coordinator (RN) to assist per patient IV care needs in the home PRN.      04/08/18 1327         Follow-up Information    [facility]-Home Follow up.   Specialty:  Home Health Services Why:  Home health nurse to follow up with you at home for IV antibiotic infusions Contact information: [address] [place] [address] [phone]        Ginnie Smart, MD Follow up on 04/16/2018.   Specialty:  Infectious Diseases Why:  11:00 am appointment.  Contact information: [address] [place] [address] [phone]           Signed: Dorian Heckle, MD 04/08/2018, 2:02 PM

## 2018-04-11 LAB — AEROBIC/ANAEROBIC CULTURE (SURGICAL/DEEP WOUND)

## 2018-04-11 LAB — AEROBIC/ANAEROBIC CULTURE W GRAM STAIN (SURGICAL/DEEP WOUND): Gram Stain: NONE SEEN

## 2018-04-13 ENCOUNTER — Other Ambulatory Visit: Payer: Self-pay | Admitting: Internal Medicine

## 2018-04-16 ENCOUNTER — Ambulatory Visit (INDEPENDENT_AMBULATORY_CARE_PROVIDER_SITE_OTHER): Payer: Medicare Other | Admitting: Infectious Diseases

## 2018-04-16 VITALS — BP 145/110 | HR 108 | Temp 98.2°F | Ht 72.0 in | Wt 160.8 lb

## 2018-04-16 DIAGNOSIS — Z5181 Encounter for therapeutic drug level monitoring: Secondary | ICD-10-CM | POA: Diagnosis not present

## 2018-04-16 DIAGNOSIS — T827XXD Infection and inflammatory reaction due to other cardiac and vascular devices, implants and grafts, subsequent encounter: Secondary | ICD-10-CM

## 2018-04-16 DIAGNOSIS — A4901 Methicillin susceptible Staphylococcus aureus infection, unspecified site: Secondary | ICD-10-CM

## 2018-04-16 DIAGNOSIS — Z452 Encounter for adjustment and management of vascular access device: Secondary | ICD-10-CM

## 2018-04-16 MED ORDER — RIFAMPIN 300 MG PO CAPS: 300.0000 mg | ORAL_CAPSULE | Freq: Two times a day (BID) | ORAL | 0 refills | Status: AC

## 2018-04-16 MED ORDER — CEPHALEXIN 500 MG PO CAPS: 500.0000 mg | ORAL_CAPSULE | Freq: Three times a day (TID) | ORAL | 0 refills | Status: DC

## 2018-04-16 NOTE — Patient Instructions (Addendum)
Will keep your PICC line for the rest of your treatment and see what your labs look like today. I would also like for you to call Dr. Fredrich Birks office if you have concern about swelling of the pocket.   I think this looks OK today and to be healing well and without signs of infection   Once you are done with your IV medication: 1. Continue your Rifampin 1 pill twice a day.  2. Start Keflex 1 pill THREE times a day.   Continue this for another month and follow up with Dr. Ninetta Lights.

## 2018-04-16 NOTE — Progress Notes (Signed)
Patient: David Rhodes  DOB: October 05, 1955 MRN: [id number] PCP: Glean Hess, MD  Referring Provider: hospital follow up  Patient Active Problem List   Diagnosis Date Noted  . PICC (peripherally inserted central catheter) in place 04/16/2018  . Staph aureus infection 04/08/2018  . 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 Essential tremor 10/18/2017  . Cervical radiculopathy 07/23/2017  . Bilateral temporomandibular joint pain 07/23/2017  . Neck pain 05/19/2017  . Medication monitoring encounter 09/11/2016  . Pulmonary nodules 12/31/2015  . COPD (chronic obstructive pulmonary disease) ([place]) 12/27/2015  . Chronic cough 12/27/2015  . Asthma 09/24/2015  . Allergic rhinitis 11/11/2014  . Nerve root pain 11/11/2014  . Dyskinesia 11/11/2014  . Acid reflux 11/11/2014  . Calcium blood increased 11/11/2014  . Affective disorder ([place]) 11/11/2014  . Essential hypertension 12/25/2011  . Chronic back pain 12/25/2011     Subjective:  David Rhodes is a 62 y.o. 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    He was discharged from the hospital on 04/08/18 after he sought care with Dr. Vertell Limber after he experienced a few months of positional pain and swelling at the left chest IPG site. At the time he presented to the clinic his generator was visible through a 1 cm skin ulceration. He was brought to [facility] on 10/08 where he underwent removal of IPG as well as adaptor and extensions. Lead covers were kept intact and for this reason we added rifampin to regimen. Intraop cultures of sites revealed methicillin sensitive staph aureus at the L chest wound (pan sensitive) and MSSE at the cranial leads (R-cipro, erythro, tetra, bactrim only).   He has some occasional chills "has not really paid attention to the pattern of them." No sweating or fevers. He has done well with IV  antibiotics and does not report missing any doses. Has friends/people close to him help administer. He reports no trouble with PICC line but does have all sutures in place still. Scalp incision is itching but chest incision is not. He is worried about chest incision site as there is still swelling; no pain, open areas, drainage or erythema.   Review of Systems  All other systems reviewed and are negative.   Past Medical History:  Diagnosis Date  . Allergy    Hay fever  . Anxiety   . Asthma   . Chronic pain due to trauma   . COPD (chronic obstructive pulmonary disease) ([place])   . Depression   . Essential tremor   . GERD (gastroesophageal reflux disease)   . Headache(784.0)   . Hypertension   . Inguinal hernia    bilateral  . Neuromuscular disorder ([facility])    hx of tremors  . Pneumonia    many years ago  . Shortness of breath    when allergies flare up  . Urine incontinence   . Wears glasses     Outpatient Medications Prior to Visit  Medication Sig Dispense Refill  . albuterol (PROVENTIL HFA;VENTOLIN HFA) 108 (90 Base) MCG/ACT inhaler INHALE 1 TO 2 PUFFS BY MOUTH EVERY 6 HOURS AS NEEDED FOR WHEEZING OR SHORTNESS OF BREATH 8.5 g 0  . albuterol (PROVENTIL) (2.5 MG/3ML) 0.083% nebulizer solution Inhale 2.5 mg into the lungs every 6 (six) hours as needed for wheezing.    Marland Kitchen amLODipine (NORVASC) 10 MG tablet TAKE 1 TABLET(10 MG) BY MOUTH DAILY  30 tablet 5  . baclofen (LIORESAL) 10 MG tablet Take 10 mg by mouth 3 (three) times daily.  0  . ceFAZolin (ANCEF) IVPB Inject 2 g into the vein every 8 (eight) hours for 12 days. Indication:  Deep brain stimulator pocket infection Last Day of Therapy:  04/20/18 Labs - Once weekly:  CBC/D and CMP 36 Units 0  . Fluticasone-Salmeterol (ADVAIR) 250-50 MCG/DOSE AEPB Inhale 1 puff into the lungs 2 (two) times daily.    Marland Kitchen gabapentin (NEURONTIN) 600 MG tablet Take 600 mg by mouth 4 (four) times daily.     Marland Kitchen lisinopril (PRINIVIL,ZESTRIL) 40 MG tablet  TAKE 1 TABLET(40 MG) BY MOUTH DAILY 30 tablet 5  . loratadine (CLARITIN) 10 MG tablet Take 1 tablet (10 mg total) by mouth daily. 30 tablet 0  . meloxicam (MOBIC) 15 MG tablet Take 15 mg by mouth daily.  1  . montelukast (SINGULAIR) 10 MG tablet TAKE 1 TABLET(10 MG) BY MOUTH AT BEDTIME 30 tablet 5  . omeprazole (PRILOSEC) 20 MG capsule Take 20 mg by mouth daily.  5  . rOPINIRole (REQUIP) 2 MG tablet Take 2 mg by mouth 2 (two) times daily.     Marland Kitchen SPIRIVA HANDIHALER 18 MCG inhalation capsule Place 18 mcg into inhaler and inhale daily.  5  . traMADol (ULTRAM) 50 MG tablet Take 1 tablet by mouth daily as needed.    Marland Kitchen ceFAZolin (ANCEF) 2-4 GM/100ML-% IVPB Inject 100 mLs (2 g total) into the vein every 8 (eight) hours for 14 days. 4200 mL 0  . lisinopril (PRINIVIL,ZESTRIL) 40 MG tablet Take 40 mg by mouth daily.    . montelukast (SINGULAIR) 10 MG tablet Take 10 mg by mouth daily.  2  . rifampin (RIFADIN) 300 MG capsule Take 1 capsule (300 mg total) by mouth every 12 (twelve) hours for 14 days. 28 capsule 0   No facility-administered medications prior to visit.      Allergies  Allergen Reactions  . Penicillins Shortness Of Breath    PATIENT HAS HAD A PCN REACTION WITH IMMEDIATE RASH, FACIAL/TONGUE/THROAT SWELLING, SOB, OR LIGHTHEADEDNESS WITH HYPOTENSION:  #  #  YES  #  #  Has patient had a PCN reaction causing severe rash involving mucus membranes or skin necrosis: No Has patient had a PCN reaction that required hospitalization No Has patient had a PCN reaction occurring within the last 10 years: No If all of the above answers are "NO", then may proceed with Cephalosporin use. **tolerated cefazolin and cephalexin 03/2017    Social History   Tobacco Use  . Smoking status: Never Smoker  . Smokeless tobacco: Current User    Types: Snuff  Substance Use Topics  . Alcohol use: No    Alcohol/week: 0.0 standard drinks  . Drug use: No    Family History  Problem Relation Age of Onset  .  Myoclonus Mother   . Breast cancer Mother   . Prostate cancer Father   . Heart disease Paternal Grandfather   . Myoclonus Maternal Uncle     Objective:   Vitals:   04/16/18 1114  BP: (!) 145/110  Pulse: (!) 108  Temp: 98.2 F (36.8 C)  Weight: 160 lb 12.8 oz (72.9 kg)  Height: 6' (1.829 m)   Body mass index is 21.81 kg/m.  Physical Exam  Constitutional: He is oriented to person, place, and time. He appears well-developed and well-nourished.  HENT:  Mouth/Throat: Oropharynx is clear and moist.  Eyes: Pupils are equal, round,  and reactive to light.  Cardiovascular: Normal rate, regular rhythm and normal heart sounds.  No murmur heard. Pulmonary/Chest: Effort normal and breath sounds normal. No respiratory distress.  Lymphadenopathy:    He has no cervical adenopathy.  Neurological: He is alert and oriented to person, place, and time.  Baseline tremor   Skin: Skin is warm and dry.  Psychiatric: He has a normal mood and affect. Thought content normal.  RUE PICC line - tape residue in place. Covered. Site with biopatch and appears to be normal.   Lab Results: Lab Results  Component Value Date   WBC 9.0 04/06/2018   HGB 14.9 04/06/2018   HCT 46.2 04/06/2018   MCV 90.6 04/06/2018   PLT 239 04/06/2018    Lab Results  Component Value Date   CREATININE 0.86 04/06/2018   BUN 9 04/06/2018   NA 138 04/06/2018   K 3.6 04/06/2018   CL 106 04/06/2018   CO2 25 04/06/2018    Lab Results  Component Value Date   ALT 16 (L) 06/23/2016   AST 26 06/23/2016   ALKPHOS 78 06/23/2016   BILITOT 0.1 (L) 06/23/2016     Assessment & Plan:   Problem List Items Addressed This Visit      Unprioritized   Infection of chest IPG pocket ([place])    He is tolerating the antibiotics well without any concern for s/e outside from some mild nausea. Advised to take with food and all if unable to keep pills down. Continue cefazolin IV through planned 2 week period 10/22 with Rifampin and then  start cephalexin 500 mg TID + Rifampin for 4 weeks. No labs to review on him today and I have not seen the chest incision prior to this encounter so uncertain as to what to expect with regards to swelling. It feels more like fibrinous/capsular tissue vs infection as there is no fluctuance, tenderness, erythema or drainage. Staples are starting to fall out and he tells me that his home health team has an order to remove them.  I asked him to please call Dr. Melven Sartorius office for appointment if he is worried about expectations with swelling of the chest site. He does have some swelling on scalp incision as well but well approximated.  Follow up in 4 weeks and have him follow up with Dr. Johnnye Sima to determine further DOT      Relevant Medications   cephALEXin (KEFLEX) 500 MG capsule (Start on 04/21/2018)   Medication monitoring encounter    No OPAT labs have been received for review. Will check CBC, CMET (on RIF), CRP/ESR today.       PICC (peripherally inserted central catheter) in place    In place. Dressing is reinforced but occlusive and in general well maintained today. D/C after last dose abx 10/22.       Staph aureus infection - Primary   Relevant Medications   cephALEXin (KEFLEX) 500 MG capsule (Start on 04/21/2018)   Other Relevant Orders   Comp Met (CMET)   CBC   C-reactive protein   Sedimentation rate      Meds ordered this encounter  Medications  . cephALEXin (KEFLEX) 500 MG capsule    Sig: Take 1 capsule (500 mg total) by mouth 3 (three) times daily.    Dispense:  90 capsule    Refill:  0    Order Specific Question:   Supervising Provider    Answer:   HATCHER, JEFFREY C [[id number]]  . rifampin (RIFADIN) 300 MG capsule  Sig: Take 1 capsule (300 mg total) by mouth every 12 (twelve) hours for 14 days.    Dispense:  28 capsule    Refill:  0    Order Specific Question:   Supervising Provider    Answer:   HATCHER, JEFFREY C [[id number]]   Return in about 4 weeks (around 05/14/2018)  for follow up.    Janene Madeira, MSN, NP-C [facility] [place] Pager: [phone] Office: [phone]  04/16/18  2:24 PM

## 2018-04-16 NOTE — Assessment & Plan Note (Signed)
In place. Dressing is reinforced but occlusive and in general well maintained today. D/C after last dose abx 10/22.

## 2018-04-16 NOTE — Assessment & Plan Note (Addendum)
He is tolerating the antibiotics well without any concern for s/e outside from some mild nausea. Advised to take with food and all if unable to keep pills down. Continue cefazolin IV through planned 2 week period 10/22 with Rifampin and then start cephalexin 500 mg TID + Rifampin for 4 weeks. No labs to review on him today and I have not seen the chest incision prior to this encounter so uncertain as to what to expect with regards to swelling. It feels more like fibrinous/capsular tissue vs infection as there is no fluctuance, tenderness, erythema or drainage. Staples are starting to fall out and he tells me that his home health team has an order to remove them.  I asked him to please call Dr. Fredrich Birks office for appointment if he is worried about expectations with swelling of the chest site. He does have some swelling on scalp incision as well but well approximated.  Follow up in 4 weeks and have him follow up with Dr. Ninetta Lights to determine further DOT

## 2018-04-16 NOTE — Progress Notes (Signed)
Elevated B/P and Pulse noted. Verified manually.

## 2018-04-16 NOTE — Assessment & Plan Note (Signed)
No OPAT labs have been received for review. Will check CBC, CMET (on RIF), CRP/ESR today.

## 2018-04-17 LAB — CBC
HEMATOCRIT: 44.4 % (ref 38.5–50.0)
Hemoglobin: 15.2 g/dL (ref 13.2–17.1)
MCH: 29.1 pg (ref 27.0–33.0)
MCHC: 34.2 g/dL (ref 32.0–36.0)
MCV: 85.1 fL (ref 80.0–100.0)
MPV: 9.5 fL (ref 7.5–12.5)
Platelets: 212 10*3/uL (ref 140–400)
RBC: 5.22 10*6/uL (ref 4.20–5.80)
RDW: 12.7 % (ref 11.0–15.0)
WBC: 10.9 10*3/uL — AB (ref 3.8–10.8)

## 2018-04-17 LAB — COMPREHENSIVE METABOLIC PANEL
AG RATIO: 1.7 (calc) (ref 1.0–2.5)
ALBUMIN MSPROF: 4.2 g/dL (ref 3.6–5.1)
ALT: 6 U/L — ABNORMAL LOW (ref 9–46)
AST: 15 U/L (ref 10–35)
Alkaline phosphatase (APISO): 82 U/L (ref 40–115)
BUN: 12 mg/dL (ref 7–25)
CALCIUM: 10.2 mg/dL (ref 8.6–10.3)
CHLORIDE: 105 mmol/L (ref 98–110)
CO2: 26 mmol/L (ref 20–32)
CREATININE: 0.76 mg/dL (ref 0.70–1.25)
GLOBULIN: 2.5 g/dL (ref 1.9–3.7)
GLUCOSE: 86 mg/dL (ref 65–99)
POTASSIUM: 4 mmol/L (ref 3.5–5.3)
SODIUM: 140 mmol/L (ref 135–146)
Total Bilirubin: 0.5 mg/dL (ref 0.2–1.2)
Total Protein: 6.7 g/dL (ref 6.1–8.1)

## 2018-04-17 LAB — C-REACTIVE PROTEIN: CRP: 2.6 mg/L (ref <8.0)

## 2018-04-17 LAB — SEDIMENTATION RATE: Sed Rate: 2 mm/h (ref 0–20)

## 2018-04-19 ENCOUNTER — Encounter: Payer: Self-pay | Admitting: Infectious Diseases

## 2018-04-27 ENCOUNTER — Telehealth: Payer: Self-pay | Admitting: Behavioral Health

## 2018-04-27 LAB — CULTURE, FUNGUS WITHOUT SMEAR

## 2018-04-27 NOTE — Telephone Encounter (Signed)
Patient called today asking questions about his PICC line.  Patient wants to know when his PICC line can  Be removed. Per note it looks like PICC was supposed to be removed after last dose of IV antibiotics 10/22.  Patient still has his PICC line in and still has one more dose of IV antibiotics to administer.  He states he missed a couple of doses that is why he still has antibiotics left.  I called [facility] and they state they discharged patient on 04/22/2018. Angeline Slim RN

## 2018-04-27 NOTE — Telephone Encounter (Signed)
Yes this was supposed to be removed. Please give [facility] the OK to pull the line. I sent in a prescription for Keflex at the last visit with him and refilled the rifampin. I would like for him to continue both of these until his follow up with Dr. Ninetta Lights.   Thank you.

## 2018-04-27 NOTE — Telephone Encounter (Signed)
Thank you kindly!

## 2018-04-27 NOTE — Telephone Encounter (Signed)
Called [facility] spoke with Pincus Sanes RN gave verbal order per [facility] that patient's PICC line can be removed.  Mary verified order with read-back.   Called Mr. David Rhodes, informed him that [facility] had been contacted and will be contacting him to remove his PICC line.  Patient was also told to start oral antibiotics that were prescribed.  Explained that he is to continue Rifampin 1 pill twice a day and Keflex 1 pill three times a day.  Patient verbalized understanding. Angeline Slim RN

## 2018-05-18 ENCOUNTER — Ambulatory Visit (INDEPENDENT_AMBULATORY_CARE_PROVIDER_SITE_OTHER): Payer: Medicare Other | Admitting: Infectious Diseases

## 2018-05-18 ENCOUNTER — Encounter: Payer: Self-pay | Admitting: Infectious Diseases

## 2018-05-18 ENCOUNTER — Telehealth: Payer: Self-pay | Admitting: Neurology

## 2018-05-18 DIAGNOSIS — T827XXD Infection and inflammatory reaction due to other cardiac and vascular devices, implants and grafts, subsequent encounter: Secondary | ICD-10-CM

## 2018-05-18 DIAGNOSIS — Z23 Encounter for immunization: Secondary | ICD-10-CM

## 2018-05-18 DIAGNOSIS — I10 Essential (primary) hypertension: Secondary | ICD-10-CM

## 2018-05-18 MED ORDER — CEPHALEXIN 500 MG PO CAPS: 500.0000 mg | ORAL_CAPSULE | Freq: Three times a day (TID) | ORAL | 6 refills | Status: AC

## 2018-05-18 NOTE — Assessment & Plan Note (Signed)
He is asx He states this is usually variable He will f/u with PCP.

## 2018-05-18 NOTE — Telephone Encounter (Signed)
Patient is calling because Dr.Stern asked him to call and talk about medication for his tremors. He has had his brain stimulator taken out due to infection so now he would be eligible to have medication. Please call him back at [phone]. Thanks!

## 2018-05-18 NOTE — Telephone Encounter (Signed)
Although I was unaware of most of this, last visit patient had [facility] because of distance to travel (compliance always been an issue).  I believe he n/s or they couldn't get ahold of him for appt.  Does he want to go back there?  If not, then okay to make a f/u here

## 2018-05-18 NOTE — Telephone Encounter (Signed)
Spoke with patient and offered appt. [facility]. He will call them to make an appt.

## 2018-05-18 NOTE — Assessment & Plan Note (Addendum)
We discsussed his anbx (keflex) and will continue them for 1 year.  He appears to be doing well. He denies difficulty with taking the anbx.  He did not have BCx.  Main concern is that he still has his wires and this could serve as nidus.   Will defer to Dr Venetia MaxonStern on reimplantation. Will assume he has cleared at this point.

## 2018-05-18 NOTE — Progress Notes (Signed)
   Subjective:    Patient ID: David BienenstockRonald E Rhodes, male    DOB: 08/04/1955, 62 y.o.   MRN: [id number]  HPI 62 yo M with hx of infected deep brain stimulator, pulse generator (Left chest).  He was adm 10-8 and had removal of generator, leads were retained. Intraop cultures of sites revealed methicillin sensitive staph aureus at the L chest wound MSSA (pan sensitive) and MSSE at the cranial leads (R-cipro, erythro, tetra, bactrim only).  He was d/c home on 10-10 on ancef-rifampin for 2 weeks (end 10-22) then 4 weeks of keflex-rifampin.   He is now on keflex alone.  Today he feels "miserable because of my tremors..Iooking forward to getting my box put back in".  He spoke with Dr Fredrich BirksStern's office about a rx for his tremors, he thought they asked him to ask ID.   Wounds are healed except for "scar place" underneath his skin (chest). He has no pain. No f/c. He has gotten a ticket for improper use of his seat belt recently.     Review of Systems  Constitutional: Negative for appetite change, chills, fever and unexpected weight change.  Respiratory: Negative for shortness of breath.   Cardiovascular: Negative for chest pain.  Gastrointestinal: Negative for constipation and diarrhea.  Genitourinary: Negative for difficulty urinating.  Neurological: Positive for tremors. Negative for headaches.  Please see HPI. All other systems reviewed and negative.      Objective:   Physical Exam  Constitutional: He is oriented to person, place, and time. He appears well-developed and well-nourished.  HENT:  Mouth/Throat: No oropharyngeal exudate.  Eyes: Pupils are equal, round, and reactive to light. EOM are normal.  Neck: Normal range of motion. Neck supple.  Cardiovascular: Normal rate, regular rhythm and normal heart sounds.  Pulmonary/Chest: Effort normal and breath sounds normal. He exhibits no tenderness and no edema.  2 scars on L pectoralis area. Well healed. No fluctuance. Non-tender.   Abdominal:  Soft. Bowel sounds are normal. He exhibits no distension. There is no tenderness.  Musculoskeletal: He exhibits no edema.  Lymphadenopathy:    He has no cervical adenopathy.  Neurological: He is alert and oriented to person, place, and time. He displays tremor.  Psychiatric: He has a normal mood and affect.      Assessment & Plan:

## 2018-05-18 NOTE — Telephone Encounter (Signed)
Would you see the patient for this?

## 2018-05-26 ENCOUNTER — Telehealth: Payer: Self-pay | Admitting: *Deleted

## 2018-05-26 NOTE — Telephone Encounter (Signed)
Patient called to request that his office note from 05/18/18 be sent to Dr Venetia MaxonStern office for review as he is trying to get his brain stimulator replaced as soon as possible. Called the office and got the fax number [phone] to fax the note over.

## 2018-06-03 LAB — HIV ANTIBODY (ROUTINE TESTING W REFLEX): HIV SCREEN 4TH GENERATION: NONREACTIVE

## 2018-06-04 ENCOUNTER — Other Ambulatory Visit: Payer: Self-pay | Admitting: Internal Medicine

## 2018-06-08 ENCOUNTER — Other Ambulatory Visit: Payer: Self-pay | Admitting: Neurosurgery

## 2018-07-01 ENCOUNTER — Other Ambulatory Visit: Payer: Self-pay | Admitting: Internal Medicine

## 2018-07-01 ENCOUNTER — Telehealth: Payer: Self-pay

## 2018-07-01 NOTE — Telephone Encounter (Signed)
Patient called and front desk alerted clinical staff. I spoke to him and his breathing was short and shallow while trying to speak. I advised ER as he was not wheezing or tight but did say he feels like it is a struggle when he is walking even across room. Family member is with him and they will take him to ER. He also asked if any of his meds need refills and I advised he contact his pharmacy.

## 2018-07-13 ENCOUNTER — Other Ambulatory Visit: Payer: Self-pay | Admitting: Internal Medicine

## 2018-08-06 NOTE — Pre-Procedure Instructions (Signed)
David Rhodes  08/06/2018      [facility] - [place], [place] - [address] [facility] [place] [address] Phone: [phone] Fax: [phone]  [id number] [place] - [address] AT [facility] [address] [facility] [place] [address] Phone: [phone] Fax: [phone]    Your procedure is scheduled on Tuesday, February 11th.  Report to [facility] Admitting at 11:25 A.M.  Call this number if you have problems the morning of surgery:  [phone]   Remember:  Do not eat or drink after midnight.    Take these medicines the morning of surgery with A SIP OF WATER  amLODipine (NORVASC) baclofen (LIORESAL)  gabapentin (NEURONTIN) loratadine (CLARITIN)  omeprazole (PRILOSEC) rOPINIRole (REQUIP)  traMADol (ULTRAM)-if needed.   Take your inhalers; bring albuterol inhaler with you to the hospital on the day of surgery.  As of today, STOP taking any Aspirin (unless otherwise instructed by your surgeon), Aleve, Naproxen, Ibuprofen, Motrin, Advil, Goody's, BC's, all herbal medications, fish oil, and all vitamins.      Do not wear jewelry.  Do not wear lotions, powders, or colognes, or deodorant.  Men may shave face and neck.  Do not bring valuables to the hospital.  [facility] is not responsible for any belongings or valuables.  Contacts, dentures or bridgework may not be worn into surgery.  Leave your suitcase in the car.  After surgery it may be brought to your room.  For patients admitted to the hospital, discharge time will be determined by your treatment team.  Patients discharged the day of surgery will not be allowed to drive home.   Special instructions:   [place]- Preparing For Surgery  Before surgery, you can play an important role. Because skin is not sterile, your skin needs to be as free of germs as possible. You can reduce the number of germs on your skin by washing with CHG  (chlorahexidine gluconate) Soap before surgery.  CHG is an antiseptic cleaner which kills germs and bonds with the skin to continue killing germs even after washing.    Oral Hygiene is also important to reduce your risk of infection.  Remember - BRUSH YOUR TEETH THE MORNING OF SURGERY WITH YOUR REGULAR TOOTHPASTE  Please do not use if you have an allergy to CHG or antibacterial soaps. If your skin becomes reddened/irritated stop using the CHG.  Do not shave (including legs and underarms) for at least 48 hours prior to first CHG shower. It is OK to shave your face.  Please follow these instructions carefully.   1. Shower the NIGHT BEFORE SURGERY and the MORNING OF SURGERY with CHG.   2. If you chose to wash your hair, wash your hair first as usual with your normal shampoo.  3. After you shampoo, rinse your hair and body thoroughly to remove the shampoo.  4. Use CHG as you would any other liquid soap. You can apply CHG directly to the skin and wash gently with a scrungie or a clean washcloth.   5. Apply the CHG Soap to your body ONLY FROM THE NECK DOWN.  Do not use on open wounds or open sores. Avoid contact with your eyes, ears, mouth and genitals (private parts). Wash Face and genitals (private parts)  with your normal soap.  6. Wash thoroughly, paying special attention to the area where your surgery will be performed.  7. Thoroughly rinse your body with warm water from the neck down.  8. DO NOT shower/wash with your normal soap after using and rinsing off the CHG Soap.  9. Pat yourself dry with a CLEAN TOWEL.  10. Wear CLEAN PAJAMAS to bed the night before surgery, wear comfortable clothes the morning of surgery  11. Place CLEAN SHEETS on your bed the night of your first shower and DO NOT SLEEP WITH PETS.    Day of Surgery:  Do not apply any deodorants/lotions.  Please wear clean clothes to the hospital/surgery center.   Remember to brush your teeth WITH YOUR REGULAR  TOOTHPASTE.   Please read over the following fact sheets that you were given.

## 2018-08-09 ENCOUNTER — Encounter (HOSPITAL_COMMUNITY): Payer: Self-pay

## 2018-08-09 ENCOUNTER — Other Ambulatory Visit: Payer: Self-pay

## 2018-08-09 ENCOUNTER — Encounter (HOSPITAL_COMMUNITY)
Admission: RE | Admit: 2018-08-09 | Discharge: 2018-08-09 | Disposition: A | Discharge status: Home / Self Care | Payer: Medicare Other | Source: Ambulatory Visit | Attending: Neurosurgery | Admitting: Neurosurgery

## 2018-08-09 DIAGNOSIS — J449 Chronic obstructive pulmonary disease, unspecified: Secondary | ICD-10-CM | POA: Diagnosis not present

## 2018-08-09 DIAGNOSIS — Z01812 Encounter for preprocedural laboratory examination: Secondary | ICD-10-CM | POA: Insufficient documentation

## 2018-08-09 DIAGNOSIS — Z79899 Other long term (current) drug therapy: Secondary | ICD-10-CM | POA: Diagnosis not present

## 2018-08-09 DIAGNOSIS — I10 Essential (primary) hypertension: Secondary | ICD-10-CM | POA: Diagnosis not present

## 2018-08-09 DIAGNOSIS — Z462 Encounter for fitting and adjustment of other devices related to nervous system and special senses: Secondary | ICD-10-CM | POA: Diagnosis not present

## 2018-08-09 DIAGNOSIS — R251 Tremor, unspecified: Secondary | ICD-10-CM | POA: Diagnosis present

## 2018-08-09 DIAGNOSIS — K219 Gastro-esophageal reflux disease without esophagitis: Secondary | ICD-10-CM | POA: Diagnosis not present

## 2018-08-09 LAB — BASIC METABOLIC PANEL
Anion gap: 11 (ref 5–15)
BUN: 15 mg/dL (ref 8–23)
CO2: 24 mmol/L (ref 22–32)
Calcium: 10.4 mg/dL — ABNORMAL HIGH (ref 8.9–10.3)
Chloride: 104 mmol/L (ref 98–111)
Creatinine, Ser: 1.03 mg/dL (ref 0.61–1.24)
GFR calc Af Amer: >60 mL/min (ref >60)
GFR calc non Af Amer: >60 mL/min (ref >60)
Glucose, Bld: 97 mg/dL (ref 70–99)
Potassium: 4.3 mmol/L (ref 3.5–5.1)
Sodium: 139 mmol/L (ref 135–145)

## 2018-08-09 LAB — CBC
HCT: 50.8 % (ref 39.0–52.0)
Hemoglobin: 16.5 g/dL (ref 13.0–17.0)
MCH: 28.6 pg (ref 26.0–34.0)
MCHC: 32.5 g/dL (ref 30.0–36.0)
MCV: 88 fL (ref 80.0–100.0)
PLATELETS: 271 10*3/uL (ref 150–400)
RBC: 5.77 MIL/uL (ref 4.22–5.81)
RDW: 12.8 % (ref 11.5–15.5)
WBC: 11.5 10*3/uL — AB (ref 4.0–10.5)
nRBC: 0 % (ref 0.0–0.2)

## 2018-08-09 MED ORDER — VANCOMYCIN HCL IN DEXTROSE 1-5 GM/200ML-% IV SOLN
1000.0000 mg | INTRAVENOUS | Status: AC
  Administered 2018-08-10: 1000 mg via INTRAVENOUS
  Filled 2018-08-09: qty 200

## 2018-08-09 NOTE — Anesthesia Preprocedure Evaluation (Addendum)
Anesthesia Evaluation  Patient identified by MRN, date of birth, ID band  Airway Mallampati: II  TM Distance: >3 FB     Dental   Pulmonary shortness of breath, asthma , pneumonia, COPD,    breath sounds clear to auscultation       Cardiovascular hypertension,  Rhythm:Regular Rate:Normal     Neuro/Psych    GI/Hepatic Neg liver ROS, GERD  ,  Endo/Other  negative endocrine ROS  Renal/GU negative Renal ROS     Musculoskeletal   Abdominal   Peds  Hematology   Anesthesia Other Findings   Reproductive/Obstetrics                            Anesthesia Physical Anesthesia Plan  ASA: III  Anesthesia Plan: General   Post-op Pain Management:    Induction: Intravenous  PONV Risk Score and Plan: 2 and Treatment may vary due to age or medical condition, Ondansetron, Dexamethasone and Midazolam  Airway Management Planned: Oral ETT  Additional Equipment:   Intra-op Plan:   Post-operative Plan: Possible Post-op intubation/ventilation  Informed Consent: I have reviewed the patients History and Physical, chart, labs and discussed the procedure including the risks, benefits and alternatives for the proposed anesthesia with the patient or authorized representative who has indicated his/her understanding and acceptance.     Dental advisory given  Plan Discussed with: CRNA and Surgeon  Anesthesia Plan Comments: (PAT note written 08/09/2018 by Shonna Chock, PA-C. )       Anesthesia Quick Evaluation

## 2018-08-09 NOTE — Pre-Procedure Instructions (Signed)
David Rhodes  08/09/2018      [facility] - [place], [place] - [address] [place] [address] Phone: [phone] Fax: [phone]  [id number] [place] - [address] AT [facility] [address] [facility] [place] [address] Phone: [phone] Fax: [phone]    Your procedure is scheduled on Tuesday, February 11th.  Report to [facility] Admitting at 11:25 A.M.  Call this number if you have problems the morning of surgery:  [phone]   Remember:  Do not eat or drink after midnight.    Take these medicines the morning of surgery with A SIP OF WATER  amLODipine (NORVASC) baclofen (LIORESAL)  gabapentin (NEURONTIN) loratadine (CLARITIN)  omeprazole (PRILOSEC) rOPINIRole (REQUIP)  traMADol (ULTRAM)-if needed.   Take your inhalers; bring albuterol inhaler with you to the hospital on the day of surgery.  As of today, STOP taking any Aspirin (unless otherwise instructed by your surgeon), Aleve, Naproxen, Ibuprofen, Motrin, Advil, Goody's, BC's, all herbal medications, fish oil, and all vitamins.      Do not wear jewelry.  Do not wear lotions, powders, or colognes, or deodorant.  Men may shave face and neck.  Do not bring valuables to the hospital.  [facility] is not responsible for any belongings or valuables.  Contacts, dentures or bridgework may not be worn into surgery.  Leave your suitcase in the car.  After surgery it may be brought to your room.  For patients admitted to the hospital, discharge time will be determined by your treatment team.  Patients discharged the day of surgery will not be allowed to drive home.   Special instructions:   [place]- Preparing For Surgery  Before surgery, you can play an important role. Because skin is not sterile, your skin needs to be as free of germs as possible. You can reduce the number of germs on your skin by washing with CHG  (chlorahexidine gluconate) Soap before surgery.  CHG is an antiseptic cleaner which kills germs and bonds with the skin to continue killing germs even after washing.    Oral Hygiene is also important to reduce your risk of infection.  Remember - BRUSH YOUR TEETH THE MORNING OF SURGERY WITH YOUR REGULAR TOOTHPASTE  Please do not use if you have an allergy to CHG or antibacterial soaps. If your skin becomes reddened/irritated stop using the CHG.  Do not shave (including legs and underarms) for at least 48 hours prior to first CHG shower. It is OK to shave your face.  Please follow these instructions carefully.   1. Shower the NIGHT BEFORE SURGERY and the MORNING OF SURGERY with CHG.   2. If you chose to wash your hair, wash your hair first as usual with your normal shampoo.  3. After you shampoo, rinse your hair and body thoroughly to remove the shampoo.  4. Use CHG as you would any other liquid soap. You can apply CHG directly to the skin and wash gently with a scrungie or a clean washcloth.   5. Apply the CHG Soap to your body ONLY FROM THE NECK DOWN.  Do not use on open wounds or open sores. Avoid contact with your eyes, ears, mouth and genitals (private parts). Wash Face and genitals (private parts)  with your normal soap.  6. Wash thoroughly, paying special attention to the area where your surgery will be performed.  7. Thoroughly rinse your body with warm water from the neck down.  8. DO NOT shower/wash with your normal soap after using and rinsing off the CHG Soap.  9. Pat yourself dry with a CLEAN TOWEL.  10. Wear CLEAN PAJAMAS to bed the night before surgery, wear comfortable clothes the morning of surgery  11. Place CLEAN SHEETS on your bed the night of your first shower and DO NOT SLEEP WITH PETS.    Day of Surgery:  Do not apply any deodorants/lotions.  Please wear clean clothes to the hospital/surgery center.   Remember to brush your teeth WITH YOUR REGULAR  TOOTHPASTE.   Please read over the following fact sheets that you were given.

## 2018-08-09 NOTE — Progress Notes (Addendum)
PCP - Bari Edward Cardiologist - saw one years ago but cant remember who  Chest x-ray - 10/11/17 EKG - 10/11/17 Stress Test - not sure ECHO - 10/30/14 Cardiac Cath - denies   Anesthesia review: yes, angela note 08/2016, allison saw patient patient refused ekg for elevated heart rate  Patient denies shortness of breath, fever, cough and chest pain at PAT appointment   Patient verbalized understanding of instructions that were given to them at the PAT appointment. Patient was also instructed that they will need to review over the PAT instructions again at home before surgery.

## 2018-08-09 NOTE — Progress Notes (Signed)
Pharmacy tech contacted to see patient.  Upon arrival patient could not remember medications so Pharmacy will need to see patient again the Day of surgery,   Patient was instructed to bring medications or a medication list the day of surgery

## 2018-08-09 NOTE — Progress Notes (Signed)
Anesthesia PAT Evaluation:  Case:  [id number] Date/Time:  08/10/18 1310   Procedure:  Replacement of deep brain stimulator pulse generator/left abdomen with long lead extensions (N/A ) - Replacement of deep brain stimulator pulse generator/left abdomen with long lead extensions   Anesthesia type:  General   Pre-op diagnosis:  Tremor   [facility]:  [facility] ROOM 21 / [facility] OR   Surgeon:  Maeola Harman, MD      DISCUSSION: Patient is a 63 year old male scheduled for the above procedure.   History includes never smoker, HTN, COPD, tremors, GERD. Deep brain stimulator (pulse generator change 09/11/16, s/p removal for infection 04/06/18, MSSA, with antibiotics per ID).   Patient seen at PAT due to tachycardia (up to 120 bpm). He reports he walked over from Dr. Fredrich Birks office before his PAT visit and felt that was contributing. He refused an EKG, but on auscultation, rhythm was regular. Pulse recheck prior to leaving PAT was ~ 107-112 bpm. He denied chest pain, palpitations, edema. He did report chronic DOE which has not changed in years. He is not on home O2. Advised that if he remains tachycardic then he may still require an EKG prior to surgery. Discussed new abnormal findings on EKG, persistent tachycardia or hypertension could results in case delay or cancellation. He feels in his usual state of health. Anesthesiologist to re-evaluate on the day of surgery.     VS: BP (!) 130/92   Pulse (!) 110 Comment: notified Jessica H. RN  Temp 36.9 C   Resp 20   Ht 6' (1.829 m)   SpO2 95%   BMI 22.51 kg/m  Heart RRR, no murmur noted. Lungs with intermittent basilar mild wheeze that cleared with deep breathing.  PROVIDERS: Reubin Milan, MD is listed as PCP   LABS: Labs reviewed: Acceptable for surgery. (all labs ordered are listed, but only abnormal results are displayed)  Labs Reviewed  BASIC METABOLIC PANEL - Abnormal; Notable for the following components:      Result Value   Calcium 10.4 (*)    All  other components within normal limits  CBC - Abnormal; Notable for the following components:   WBC 11.5 (*)    All other components within normal limits    IMAGES: CXR 10/11/17: IMPRESSION: Hyperinflation with chronic bronchitic changes. No acute airspace disease.   EKG: 10/11/17 ST at 117 bpm. Biatrial enlargement.   CV: Echo 10/30/14:  1. Normal LV systolic function. 2. Normal RV systolic function. 3. Mild valvular regurgitation. Trivial MR. 4. No valvular stenosis.   Past Medical History:  Diagnosis Date  . Allergy    Hay fever  . Anxiety   . Asthma   . Chronic pain due to trauma   . COPD (chronic obstructive pulmonary disease) (HCC)   . Depression   . Essential tremor   . GERD (gastroesophageal reflux disease)   . Headache(784.0)    neck pain  . Hypertension   . Inguinal hernia    bilateral  . Neuromuscular disorder (HCC)    hx of tremors  . Pneumonia    many years ago  . Shortness of breath    when allergies flare up  . Urine incontinence   . Wears glasses     Past Surgical History:  Procedure Laterality Date  . DEEP BRAIN STIMULATOR PLACEMENT    . INGUINAL HERNIA REPAIR Bilateral 02/07/2014   Procedure:  BILATERAL INGUINAL HERNIA REPAIR;  Surgeon: Axel Filler, MD;  Location: [facility] OR;  Service: General;  Laterality: Bilateral;  . INSERTION OF MESH Bilateral 02/07/2014   Procedure: INSERTION OF MESH;  Surgeon: Axel Filler, MD;  Location: [facility] OR;  Service: General;  Laterality: Bilateral;  . NASAL SINUS SURGERY    . PULSE GENERATOR IMPLANT Left 09/11/2016   Procedure: Left chest-Change implantable pulse generator battery;  Surgeon: Maeola Harman, MD;  Location: [facility] OR;  Service: Neurosurgery;  Laterality: Left;  Left chest-Change implantable pulse generator battery  . PULSE GENERATOR IMPLANT Left 04/06/2018   Procedure: Revision of left chest implantable pulse generator, extension, and pocket adapter;  Surgeon: Maeola Harman, MD;  Location: [facility] OR;  Service:  Neurosurgery;  Laterality: Left;  . SUBTHALAMIC STIMULATOR BATTERY REPLACEMENT  12/09/2011   Procedure: SUBTHALAMIC STIMULATOR BATTERY REPLACEMENT;  Surgeon: Maeola Harman, MD;  Location: [facility] NEURO ORS;  Service: Neurosurgery;  Laterality: N/A;   deep brain stimulator, implantable pulse generator change  . SUBTHALAMIC STIMULATOR BATTERY REPLACEMENT Left 04/21/2014   Procedure: Deep brain stimulator battery change;  Surgeon: Maeola Harman, MD;  [facility] NEUR[facility];  Service: Neurosurgery;  Laterality: Left;  Deep brain stimulator battery change    MEDICATIONS: . albuterol (PROVENTIL HFA;VENTOLIN HFA) 108 (90 Base) MCG/ACT inhaler  . albuterol (PROVENTIL) (2.5 MG/3ML) 0.083% nebulizer solution  . amLODipine (NORVASC) 10 MG tablet  . baclofen (LIORESAL) 10 MG tablet  . Fluticasone-Salmeterol (ADVAIR) 250-50 MCG/DOSE AEPB  . gabapentin (NEURONTIN) 600 MG tablet  . lisinopril (PRINIVIL,ZESTRIL) 40 MG tablet  . loratadine (CLARITIN) 10 MG tablet  . meloxicam (MOBIC) 15 MG tablet  . montelukast (SINGULAIR) 10 MG tablet  . omeprazole (PRILOSEC) 20 MG capsule  . rOPINIRole (REQUIP) 2 MG tablet  . SPIRIVA HANDIHALER 18 MCG inhalation capsule  . traMADol (ULTRAM) 50 MG tablet   No current facility-administered medications for this encounter.    Melene Muller ON 08/10/2018] vancomycin (VANCOCIN) IVPB 1000 mg/200 mL premix    Shonna Chock, PA-C Surgical Short Stay/Anesthesiology [facility] Phone [phone] [facility] Phone [phone] 08/09/2018 4:56 PM

## 2018-08-10 ENCOUNTER — Encounter (HOSPITAL_COMMUNITY): Payer: Self-pay | Admitting: Certified Registered"

## 2018-08-10 ENCOUNTER — Encounter (HOSPITAL_COMMUNITY)
Admission: RE | Disposition: A | Discharge status: Home / Self Care | Payer: Self-pay | Source: Home / Self Care | Attending: Neurosurgery

## 2018-08-10 ENCOUNTER — Ambulatory Visit (HOSPITAL_COMMUNITY): Payer: Medicare Other | Admitting: Certified Registered"

## 2018-08-10 ENCOUNTER — Other Ambulatory Visit: Payer: Self-pay

## 2018-08-10 ENCOUNTER — Ambulatory Visit (HOSPITAL_COMMUNITY): Payer: Medicare Other | Admitting: Vascular Surgery

## 2018-08-10 ENCOUNTER — Observation Stay (HOSPITAL_COMMUNITY)
Admission: RE | Admit: 2018-08-10 | Discharge: 2018-08-11 | Disposition: A | Discharge status: Home / Self Care | Payer: Medicare Other | Attending: Neurosurgery | Admitting: Neurosurgery

## 2018-08-10 DIAGNOSIS — K219 Gastro-esophageal reflux disease without esophagitis: Secondary | ICD-10-CM | POA: Insufficient documentation

## 2018-08-10 DIAGNOSIS — Z462 Encounter for fitting and adjustment of other devices related to nervous system and special senses: Secondary | ICD-10-CM | POA: Diagnosis not present

## 2018-08-10 DIAGNOSIS — Z79899 Other long term (current) drug therapy: Secondary | ICD-10-CM | POA: Insufficient documentation

## 2018-08-10 DIAGNOSIS — I10 Essential (primary) hypertension: Secondary | ICD-10-CM | POA: Diagnosis not present

## 2018-08-10 DIAGNOSIS — R251 Tremor, unspecified: Secondary | ICD-10-CM | POA: Diagnosis present

## 2018-08-10 DIAGNOSIS — J449 Chronic obstructive pulmonary disease, unspecified: Secondary | ICD-10-CM | POA: Insufficient documentation

## 2018-08-10 HISTORY — PX: SUBTHALAMIC STIMULATOR BATTERY REPLACEMENT: SHX5405

## 2018-08-10 SURGERY — SUBTHALAMIC STIMULATOR BATTERY REPLACEMENT
Anesthesia: General | Site: Abdomen

## 2018-08-10 MED ORDER — TIOTROPIUM BROMIDE MONOHYDRATE 18 MCG IN CAPS
18.0000 ug | ORAL_CAPSULE | Freq: Every day | RESPIRATORY_TRACT | Status: DC
  Administered 2018-08-11: 18 ug via RESPIRATORY_TRACT
  Filled 2018-08-10: qty 5

## 2018-08-10 MED ORDER — LIDOCAINE 2% (20 MG/ML) 5 ML SYRINGE
INTRAMUSCULAR | Status: DC | PRN
  Administered 2018-08-10: 60 mg via INTRAVENOUS

## 2018-08-10 MED ORDER — FENTANYL CITRATE (PF) 250 MCG/5ML IJ SOLN
INTRAMUSCULAR | Status: DC | PRN
  Administered 2018-08-10: 100 ug via INTRAVENOUS

## 2018-08-10 MED ORDER — HYDROCODONE-ACETAMINOPHEN 5-325 MG PO TABS
2.0000 | ORAL_TABLET | ORAL | Status: DC | PRN
  Administered 2018-08-10: 2 via ORAL
  Filled 2018-08-10 (×2): qty 2

## 2018-08-10 MED ORDER — MENTHOL 3 MG MT LOZG
1.0000 | LOZENGE | OROMUCOSAL | Status: DC | PRN
  Filled 2018-08-10: qty 9

## 2018-08-10 MED ORDER — SODIUM CHLORIDE 0.9% FLUSH: 3.0000 mL | INTRAVENOUS | Status: DC | PRN

## 2018-08-10 MED ORDER — CHLORHEXIDINE GLUCONATE CLOTH 2 % EX PADS: 6.0000 | MEDICATED_PAD | Freq: Once | CUTANEOUS | Status: DC

## 2018-08-10 MED ORDER — MORPHINE SULFATE (PF) 2 MG/ML IV SOLN: 2.0000 mg | INTRAVENOUS | Status: DC | PRN

## 2018-08-10 MED ORDER — MIDAZOLAM HCL 2 MG/2ML IJ SOLN
INTRAMUSCULAR | Status: AC
  Filled 2018-08-10: qty 2

## 2018-08-10 MED ORDER — ROPINIROLE HCL 1 MG PO TABS: 2.0000 mg | ORAL_TABLET | Freq: Two times a day (BID) | ORAL | Status: DC

## 2018-08-10 MED ORDER — MIDAZOLAM HCL 5 MG/5ML IJ SOLN
INTRAMUSCULAR | Status: DC | PRN
  Administered 2018-08-10: 2 mg via INTRAVENOUS

## 2018-08-10 MED ORDER — FENTANYL CITRATE (PF) 100 MCG/2ML IJ SOLN
INTRAMUSCULAR | Status: AC
  Filled 2018-08-10: qty 2

## 2018-08-10 MED ORDER — FENTANYL CITRATE (PF) 100 MCG/2ML IJ SOLN
25.0000 ug | INTRAMUSCULAR | Status: DC | PRN
  Administered 2018-08-10: 25 ug via INTRAVENOUS

## 2018-08-10 MED ORDER — ACETAMINOPHEN 650 MG RE SUPP: 650.0000 mg | RECTAL | Status: DC | PRN

## 2018-08-10 MED ORDER — LISINOPRIL 20 MG PO TABS: 40.0000 mg | ORAL_TABLET | Freq: Every day | ORAL | Status: DC

## 2018-08-10 MED ORDER — 0.9 % SODIUM CHLORIDE (POUR BTL) OPTIME
TOPICAL | Status: DC | PRN
  Administered 2018-08-10: 1000 mL

## 2018-08-10 MED ORDER — VANCOMYCIN HCL 1000 MG IV SOLR
INTRAVENOUS | Status: AC
  Filled 2018-08-10: qty 1000

## 2018-08-10 MED ORDER — LIDOCAINE-EPINEPHRINE 1 %-1:100000 IJ SOLN
INTRAMUSCULAR | Status: DC | PRN
  Administered 2018-08-10: 8.5 mL

## 2018-08-10 MED ORDER — SUGAMMADEX SODIUM 200 MG/2ML IV SOLN
INTRAVENOUS | Status: DC | PRN
  Administered 2018-08-10: 150 mg via INTRAVENOUS

## 2018-08-10 MED ORDER — BISACODYL 10 MG RE SUPP: 10.0000 mg | Freq: Every day | RECTAL | Status: DC | PRN

## 2018-08-10 MED ORDER — THROMBIN 5000 UNITS EX SOLR
CUTANEOUS | Status: AC
  Filled 2018-08-10: qty 10000

## 2018-08-10 MED ORDER — ROCURONIUM BROMIDE 50 MG/5ML IV SOSY
PREFILLED_SYRINGE | INTRAVENOUS | Status: DC | PRN
  Administered 2018-08-10: 50 mg via INTRAVENOUS

## 2018-08-10 MED ORDER — VANCOMYCIN HCL 1000 MG IV SOLR
INTRAVENOUS | Status: DC | PRN
  Administered 2018-08-10: 1000 mg via TOPICAL

## 2018-08-10 MED ORDER — PROPOFOL 10 MG/ML IV BOLUS
INTRAVENOUS | Status: DC | PRN
  Administered 2018-08-10: 100 mg via INTRAVENOUS

## 2018-08-10 MED ORDER — KCL IN DEXTROSE-NACL 20-5-0.45 MEQ/L-%-% IV SOLN: INTRAVENOUS | Status: DC

## 2018-08-10 MED ORDER — DEXAMETHASONE SODIUM PHOSPHATE 10 MG/ML IJ SOLN
INTRAMUSCULAR | Status: AC
  Filled 2018-08-10: qty 3

## 2018-08-10 MED ORDER — MONTELUKAST SODIUM 10 MG PO TABS
10.0000 mg | ORAL_TABLET | Freq: Every day | ORAL | Status: DC
  Filled 2018-08-10: qty 1

## 2018-08-10 MED ORDER — BACITRACIN ZINC 500 UNIT/GM EX OINT
TOPICAL_OINTMENT | CUTANEOUS | Status: AC
  Filled 2018-08-10: qty 28.35

## 2018-08-10 MED ORDER — BUPIVACAINE HCL (PF) 0.5 % IJ SOLN
INTRAMUSCULAR | Status: DC | PRN
  Administered 2018-08-10: 8.5 mL

## 2018-08-10 MED ORDER — MOMETASONE FURO-FORMOTEROL FUM 200-5 MCG/ACT IN AERO
2.0000 | INHALATION_SPRAY | Freq: Two times a day (BID) | RESPIRATORY_TRACT | Status: DC
  Administered 2018-08-11: 2 via RESPIRATORY_TRACT
  Filled 2018-08-10: qty 8.8

## 2018-08-10 MED ORDER — ONDANSETRON HCL 4 MG/2ML IJ SOLN
INTRAMUSCULAR | Status: DC | PRN
  Administered 2018-08-10: 4 mg via INTRAVENOUS

## 2018-08-10 MED ORDER — AMLODIPINE BESYLATE 5 MG PO TABS: 10.0000 mg | ORAL_TABLET | Freq: Every day | ORAL | Status: DC

## 2018-08-10 MED ORDER — ACETAMINOPHEN 325 MG PO TABS: 650.0000 mg | ORAL_TABLET | ORAL | Status: DC | PRN

## 2018-08-10 MED ORDER — DEXAMETHASONE SODIUM PHOSPHATE 10 MG/ML IJ SOLN
INTRAMUSCULAR | Status: DC | PRN
  Administered 2018-08-10: 10 mg via INTRAVENOUS

## 2018-08-10 MED ORDER — FENTANYL CITRATE (PF) 250 MCG/5ML IJ SOLN
INTRAMUSCULAR | Status: AC
  Filled 2018-08-10: qty 5

## 2018-08-10 MED ORDER — SODIUM CHLORIDE 0.9% FLUSH: 3.0000 mL | Freq: Two times a day (BID) | INTRAVENOUS | Status: DC

## 2018-08-10 MED ORDER — POLYETHYLENE GLYCOL 3350 17 G PO PACK: 17.0000 g | PACK | Freq: Every day | ORAL | Status: DC | PRN

## 2018-08-10 MED ORDER — PHENYLEPHRINE 40 MCG/ML (10ML) SYRINGE FOR IV PUSH (FOR BLOOD PRESSURE SUPPORT)
PREFILLED_SYRINGE | INTRAVENOUS | Status: AC
  Filled 2018-08-10: qty 20

## 2018-08-10 MED ORDER — BACITRACIN ZINC 500 UNIT/GM EX OINT
TOPICAL_OINTMENT | CUTANEOUS | Status: DC | PRN
  Administered 2018-08-10: 1 via TOPICAL

## 2018-08-10 MED ORDER — LORATADINE 10 MG PO TABS: 10.0000 mg | ORAL_TABLET | Freq: Every day | ORAL | Status: DC

## 2018-08-10 MED ORDER — ALBUTEROL SULFATE HFA 108 (90 BASE) MCG/ACT IN AERS: 1.0000 | INHALATION_SPRAY | Freq: Four times a day (QID) | RESPIRATORY_TRACT | Status: DC | PRN

## 2018-08-10 MED ORDER — SODIUM CHLORIDE 0.9 % IV SOLN: 250.0000 mL | INTRAVENOUS | Status: DC

## 2018-08-10 MED ORDER — ALBUTEROL SULFATE (2.5 MG/3ML) 0.083% IN NEBU: 2.5000 mg | INHALATION_SOLUTION | Freq: Four times a day (QID) | RESPIRATORY_TRACT | Status: DC | PRN

## 2018-08-10 MED ORDER — BACLOFEN 10 MG PO TABS
10.0000 mg | ORAL_TABLET | Freq: Three times a day (TID) | ORAL | Status: DC
  Filled 2018-08-10 (×2): qty 1

## 2018-08-10 MED ORDER — SODIUM CHLORIDE 0.9 % IV SOLN
INTRAVENOUS | Status: DC | PRN
  Administered 2018-08-10: 50 ug/min via INTRAVENOUS

## 2018-08-10 MED ORDER — ONDANSETRON HCL 4 MG PO TABS: 4.0000 mg | ORAL_TABLET | Freq: Four times a day (QID) | ORAL | Status: DC | PRN

## 2018-08-10 MED ORDER — ALUM & MAG HYDROXIDE-SIMETH 200-200-20 MG/5ML PO SUSP: 30.0000 mL | Freq: Four times a day (QID) | ORAL | Status: DC | PRN

## 2018-08-10 MED ORDER — BUPIVACAINE HCL (PF) 0.5 % IJ SOLN
INTRAMUSCULAR | Status: AC
  Filled 2018-08-10: qty 30

## 2018-08-10 MED ORDER — LIDOCAINE-EPINEPHRINE 1 %-1:100000 IJ SOLN
INTRAMUSCULAR | Status: AC
  Filled 2018-08-10: qty 1

## 2018-08-10 MED ORDER — VANCOMYCIN HCL IN DEXTROSE 1-5 GM/200ML-% IV SOLN
1000.0000 mg | Freq: Two times a day (BID) | INTRAVENOUS | Status: DC
  Administered 2018-08-10: 1000 mg via INTRAVENOUS
  Filled 2018-08-10: qty 200

## 2018-08-10 MED ORDER — PHENOL 1.4 % MT LIQD
1.0000 | OROMUCOSAL | Status: DC | PRN
  Administered 2018-08-10: 1 via OROMUCOSAL
  Filled 2018-08-10: qty 177

## 2018-08-10 MED ORDER — ONDANSETRON HCL 4 MG/2ML IJ SOLN: 4.0000 mg | Freq: Four times a day (QID) | INTRAMUSCULAR | Status: DC | PRN

## 2018-08-10 MED ORDER — LIDOCAINE 2% (20 MG/ML) 5 ML SYRINGE
INTRAMUSCULAR | Status: AC
  Filled 2018-08-10: qty 15

## 2018-08-10 MED ORDER — PANTOPRAZOLE SODIUM 40 MG IV SOLR: 40.0000 mg | Freq: Every day | INTRAVENOUS | Status: DC

## 2018-08-10 MED ORDER — PANTOPRAZOLE SODIUM 40 MG PO TBEC: 40.0000 mg | DELAYED_RELEASE_TABLET | Freq: Every day | ORAL | Status: DC

## 2018-08-10 MED ORDER — PROPOFOL 10 MG/ML IV BOLUS
INTRAVENOUS | Status: AC
  Filled 2018-08-10: qty 20

## 2018-08-10 MED ORDER — GABAPENTIN 600 MG PO TABS
600.0000 mg | ORAL_TABLET | Freq: Four times a day (QID) | ORAL | Status: DC
  Filled 2018-08-10: qty 1

## 2018-08-10 MED ORDER — ROCURONIUM BROMIDE 50 MG/5ML IV SOSY
PREFILLED_SYRINGE | INTRAVENOUS | Status: AC
  Filled 2018-08-10: qty 20

## 2018-08-10 MED ORDER — ZOLPIDEM TARTRATE 5 MG PO TABS: 5.0000 mg | ORAL_TABLET | Freq: Every evening | ORAL | Status: DC | PRN

## 2018-08-10 MED ORDER — LACTATED RINGERS IV SOLN
INTRAVENOUS | Status: DC
  Administered 2018-08-10 (×3): via INTRAVENOUS

## 2018-08-10 MED ORDER — ONDANSETRON HCL 4 MG/2ML IJ SOLN
INTRAMUSCULAR | Status: AC
  Filled 2018-08-10: qty 4

## 2018-08-10 MED ORDER — PHENYLEPHRINE 40 MCG/ML (10ML) SYRINGE FOR IV PUSH (FOR BLOOD PRESSURE SUPPORT)
PREFILLED_SYRINGE | INTRAVENOUS | Status: DC | PRN
  Administered 2018-08-10: 80 ug via INTRAVENOUS

## 2018-08-10 MED ORDER — HYDROCODONE-ACETAMINOPHEN 5-325 MG PO TABS: 1.0000 | ORAL_TABLET | ORAL | Status: DC | PRN

## 2018-08-10 MED ORDER — DOCUSATE SODIUM 100 MG PO CAPS: 100.0000 mg | ORAL_CAPSULE | Freq: Two times a day (BID) | ORAL | Status: DC

## 2018-08-10 SURGICAL SUPPLY — 52 items
ADH SKN CLS APL DERMABOND .7 (GAUZE/BANDAGES/DRESSINGS) ×1
CANISTER SUCT 3000ML PPV (MISCELLANEOUS) ×3 IMPLANT
CARTRIDGE OIL MAESTRO DRILL (MISCELLANEOUS) ×1 IMPLANT
COIL EXT DBS STRETCH 95 (MISCELLANEOUS) ×4 IMPLANT
COVER WAND RF STERILE (DRAPES) ×3 IMPLANT
DECANTER SPIKE VIAL GLASS SM (MISCELLANEOUS) ×5 IMPLANT
DERMABOND ADVANCED (GAUZE/BANDAGES/DRESSINGS) ×2
DERMABOND ADVANCED .7 DNX12 (GAUZE/BANDAGES/DRESSINGS) ×1 IMPLANT
DIFFUSER DRILL AIR PNEUMATIC (MISCELLANEOUS) ×1 IMPLANT
DRAPE INCISE IOBAN 66X45 STRL (DRAPES) ×2 IMPLANT
DRAPE LAPAROTOMY 100X72 PEDS (DRAPES) ×1 IMPLANT
DRAPE ORTHO SPLIT 77X108 STRL (DRAPES) ×6
DRAPE POUCH INSTRU U-SHP 10X18 (DRAPES) ×3 IMPLANT
DRAPE SURG ORHT 6 SPLT 77X108 (DRAPES) IMPLANT
DRSG OPSITE POSTOP 3X4 (GAUZE/BANDAGES/DRESSINGS) ×3 IMPLANT
DRSG TEGADERM 4X4.75 (GAUZE/BANDAGES/DRESSINGS) ×6 IMPLANT
DRSG TELFA 3X8 NADH (GAUZE/BANDAGES/DRESSINGS) ×6 IMPLANT
DURAPREP 26ML APPLICATOR (WOUND CARE) ×3 IMPLANT
GAUZE 4X4 16PLY RFD (DISPOSABLE) IMPLANT
GLOVE BIO SURGEON STRL SZ8 (GLOVE) ×7 IMPLANT
GLOVE BIOGEL PI IND STRL 8 (GLOVE) ×1 IMPLANT
GLOVE BIOGEL PI IND STRL 8.5 (GLOVE) ×1 IMPLANT
GLOVE BIOGEL PI INDICATOR 8 (GLOVE) ×10
GLOVE BIOGEL PI INDICATOR 8.5 (GLOVE) ×2
GLOVE ECLIPSE 7.5 STRL STRAW (GLOVE) ×4 IMPLANT
GLOVE ECLIPSE 8.0 STRL XLNG CF (GLOVE) ×3 IMPLANT
GLOVE EXAM NITRILE XL STR (GLOVE) IMPLANT
GOWN STRL REUS W/ TWL LRG LVL3 (GOWN DISPOSABLE) IMPLANT
GOWN STRL REUS W/ TWL XL LVL3 (GOWN DISPOSABLE) ×1 IMPLANT
GOWN STRL REUS W/TWL 2XL LVL3 (GOWN DISPOSABLE) ×7 IMPLANT
GOWN STRL REUS W/TWL LRG LVL3 (GOWN DISPOSABLE)
GOWN STRL REUS W/TWL XL LVL3 (GOWN DISPOSABLE) ×6
KIT BASIN OR (CUSTOM PROCEDURE TRAY) ×3 IMPLANT
KIT TURNOVER KIT B (KITS) ×3 IMPLANT
NDL HYPO 25X1 1.5 SAFETY (NEEDLE) ×1 IMPLANT
NEEDLE HYPO 25X1 1.5 SAFETY (NEEDLE) ×3 IMPLANT
NEUROSTIM OCTOPOLAR ~~LOC~~ 49X65 (Neuro Prosthesis/Implant) ×2 IMPLANT
NEUROSTIM PROGRAMMER 2.2X3.7 (NEUROSURGERY SUPPLIES) ×2 IMPLANT
NS IRRIG 1000ML POUR BTL (IV SOLUTION) ×3 IMPLANT
OIL CARTRIDGE MAESTRO DRILL (MISCELLANEOUS) ×3
PACK LAMINECTOMY NEURO (CUSTOM PROCEDURE TRAY) ×3 IMPLANT
PAD ARMBOARD 7.5X6 YLW CONV (MISCELLANEOUS) ×9 IMPLANT
PAD DRESSING TELFA 3X8 NADH (GAUZE/BANDAGES/DRESSINGS) IMPLANT
STAPLER SKIN PROX WIDE 3.9 (STAPLE) ×2 IMPLANT
SUT SILK 2 0 PERMA HAND 18 BK (SUTURE) ×4 IMPLANT
SUT SILK 2 0 TIES 10X30 (SUTURE) ×3 IMPLANT
SUT VIC AB 2-0 CP2 18 (SUTURE) ×5 IMPLANT
SUT VIC AB 3-0 SH 8-18 (SUTURE) ×5 IMPLANT
TOOL TUNNELING (INSTRUMENTS) ×2 IMPLANT
TOWEL GREEN STERILE (TOWEL DISPOSABLE) ×3 IMPLANT
TOWEL GREEN STERILE FF (TOWEL DISPOSABLE) ×1 IMPLANT
WATER STERILE IRR 1000ML POUR (IV SOLUTION) ×3 IMPLANT

## 2018-08-10 NOTE — H&P (Signed)
Patient ID:   [id number] Patient: David Rhodes  Date of Birth: Mar 11, 1956 Visit Type: Office Visit   Date: 05/26/2018 12:30 PM Provider: Danae Orleans. Venetia Maxon MD   This 63 year old male presents for Follow Up of DBS.  HISTORY OF PRESENT ILLNESS: 1.  Follow Up of DBS  04/06/2018 left DBS IPG and lead extension removal  Patient returns, hopeful of IPG reimplant soon.  He remains on p.o. Antibiotics by Dr. Clydene Pugh with Infectious Disease.  His next appointment is sometime in January.  Left chest site has healed nicely.      Medical/Surgical/Interim History Reviewed, no change.  Last detailed document date:04/18/2014.     Family History: Reviewed, no changes.  Last detailed document date:04/18/2014.   Social History: Reviewed, no changes. Last detailed document date: 04/18/2014.    MEDICATIONS: (added, continued or stopped this visit) Started Medication Directions Instruction Stopped  gabapentin 600 mg tablet take 1 tablet by oral route 3 times every day    omeprazole 20 mg capsule,delayed release take 1 capsule by oral route  every day 30 minutes to 1 hour before a meal    Requip take 1 tablet by oral route  every bedtime    Singulair take 1 tablet by oral route  every day in the evening    Spiriva with HandiHaler 18 mcg and inhalation capsules inhale 1 capsule by inhalation route  every day using 2 inhalations via handihaler      ALLERGIES: Ingredient Reaction Medication Name Comment PENICILLINS Unknown       PHYSICAL EXAM:  Vitals Date Temp F BP Pulse Ht In Wt Lb BMI BSA Pain Score 05/26/2018  154/103 77 72 164 22.24  0/10     IMPRESSION:  Patient will return after being seen by Dr. Ninetta Lights in January, if cleared, for re-implantation. Patient will continue with his antibiotics as prescribed.  PLAN: 1. Follow-up in January after appointment with Dr.  Ninetta Lights  Orders: Instruction(s)/Education: Assessment Instruction I10 Hypertension education  Completed Orders (this encounter) Order Details Reason Side Interpretation Result Initial Treatment Date Region Hypertension education Continue to monitor blood pressure , if blood pressure continues elevated contact primary care        Assessment/Plan  # Detail Type Description  1. Assessment Essential (primary) hypertension (I10).       Pain Management Plan Pain Scale: 0/10. Method: Numeric Pain Intensity Scale. Location: DBS/ Tremors. Onset: 06/08/2014. Duration: varies. Quality: discomforting. Pain management follow-up plan of care: Patient taken medication as prescribed.  Fall Risk Plan The patient has not fallen in the last year.              Provider:  Danae Orleans. Venetia Maxon MD  05/26/2018 10:27 AM Dictation edited by: Briscoe Burns    CC Providers: Toy Cookey [address] [place],  [place]  [address]-   Maeola Harman MD  [address] [place], [place] [address]               Electronically signed by Danae Orleans. Venetia Maxon MD on 05/29/2018 02:20 PM    Patient ID:   [id number] Patient: David Rhodes  Date of Birth: [birth year] Visit Type: Office Visit   Date: 04/26/2018 11:45 AM Provider: Danae Orleans. Venetia Maxon MD   This 63 year old male presents for Follow Up of DBS.  HISTORY OF PRESENT ILLNESS: 1.  Follow Up of DBS  04/06/2018 revision of left chest DBS implanted pulse generator  Patient returns for staple removal.  Incisions have healed nicely left  chest.  No erythema swelling or drainage.  IV antibiotics continue through right arm PIC managed by infectious disease.      Medical/Surgical/Interim History Reviewed, no change.  Last detailed document date:04/18/2014.     Family History: Reviewed, no changes.  Last detailed document date:04/18/2014.   Social History: Reviewed, no changes. Last detailed document date:  04/18/2014.    MEDICATIONS: (added, continued or stopped this visit) Started Medication Directions Instruction Stopped  gabapentin 600 mg tablet take 1 tablet by oral route 3 times every day    omeprazole 20 mg capsule,delayed release take 1 capsule by oral route  every day 30 minutes to 1 hour before a meal    Requip take 1 tablet by oral route  every bedtime    Singulair take 1 tablet by oral route  every day in the evening    Spiriva with HandiHaler 18 mcg and inhalation capsules inhale 1 capsule by inhalation route  every day using 2 inhalations via handihaler      ALLERGIES: Ingredient Reaction Medication Name Comment PENICILLINS Unknown       PHYSICAL EXAM:  Vitals Date Temp F BP Pulse Ht In Wt Lb BMI BSA Pain Score 04/26/2018  164/110 89 72 165 22.38  0/10     IMPRESSION:  Recommended patient finish current course of antibiotics then return.  PLAN: 1. Follow-up in 1 month               Provider:  Danae Orleans. Venetia Maxon MD  04/26/2018 01:06 PM Dictation edited by: Briscoe Burns    CC Providers: Toy Cookey [address] [place],  [place]  [address]-   Maeola Harman MD  [address] [place], [place] [address]               Electronically signed by Danae Orleans. Venetia Maxon MD on 05/01/2018 01:14 PM    Patient ID:   [id number] Patient: David Rhodes  Date of Birth: Jun 30, 1956 Visit Type: Office Visit   Date: 03/31/2018 01:45 PM Provider: Danae Orleans. Venetia Maxon MD   This 63 year old male presents for Wound check up.  HISTORY OF PRESENT ILLNESS: 1.  Wound check up  Patient visits today after calling on the 30th.  Returned calls found line disconnected. He opens his shirt to reveal 1cm open left chest incision with IPG and extension visible.  Site is without drainage. Some pocket swelling is visible. He denies fevers.  He notes DBS continues to work well for his  symptoms.      Medical/Surgical/Interim History Reviewed, no change.  Last detailed document date:04/18/2014.     Family History: Reviewed, no changes.  Last detailed document date:04/18/2014.   Social History: Reviewed, no changes. Last detailed document date: 04/18/2014.    MEDICATIONS: (added, continued or stopped this visit) Started Medication Directions Instruction Stopped  gabapentin 600 mg tablet take 1 tablet by oral route 3 times every day    omeprazole 20 mg capsule,delayed release take 1 capsule by oral route  every day 30 minutes to 1 hour before a meal    Requip take 1 tablet by oral route  every bedtime    Singulair take 1 tablet by oral route  every day in the evening    Spiriva with HandiHaler 18 mcg and inhalation capsules inhale 1 capsule by inhalation route  every day using 2 inhalations via handihaler      ALLERGIES: Ingredient Reaction Medication Name Comment PENICILLINS Unknown       PHYSICAL EXAM:  Vitals Date Temp F BP Pulse Ht In Wt Lb BMI BSA Pain Score 03/31/2018  130/89 96 72 185 25.09  0/10     IMPRESSION:  David RidgeRonald Sarra returns in good spirits, as per his usual.  He states he has avoided calling over the last couple months as he feared we would have to remove his deep brain stimulator.  He notes only positional pain at the left chest IPG site.  Unfortunately, however, his deep brain stimulator implanted pulse generator is visible through a 1 cm skin ulceration.  PLAN: We discussed the very real possibility that his entire device (deep brain stimulator electrodes/leads, extension and IPG) might need to come out.  This would leave him without any tremor control, severely compromising his mobility and ability to care for himself.  In light of this, an attempt will be made to preserve his deep brain stimulator electrodes, by replacing the IPG and extension.  New extension would be tunneled  possibly to his abdomen where and new pulse generator would be placed.  He is agreeable to this plan.  He will keep the site covered with sterile dressings in the meanwhile; and we will plan [facility].  Orders: Instruction(s)/Education: Assessment Instruction R03.0 Hypertension education Z68.25 Lifestyle education regarding diet  Completed Orders (this encounter) Order Details Reason Side Interpretation Result Initial Treatment Date Region Hypertension education Continue to monitor blood pressure, if blood pressure remains elevated contact primary doctor       Lifestyle education regarding diet Patient encourage to eat a well balance diet        Assessment/Plan  # Detail Type Description  1. 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     2. Assessment Tremor (R25.1).     3. Assessment Body mass index (BMI) 25.0-25.9, adult (Q25.95(Z68.25).  Plan Orders Today's instructions / counseling include(s) Lifestyle education regarding diet. Clinical information/comments: Patient encourage to eat a well balance diet.     4. Assessment Elevated blood-pressure reading, w/o diagnosis of htn (R03.0).       Pain Management Plan Pain Scale: 0/10. Method: Numeric Pain Intensity Scale. Location: L side chest. Onset: 06/08/2014. Duration: varies. Quality: discomforting. Pain management follow-up plan of care: Patient taken medication as prescribed.  Fall Risk Plan The patient has not fallen in the last year.              Provider:  Danae OrleansJoseph D. Venetia MaxonStern MD  03/31/2018 04:34 PM Dictation edited by: Oris DroneBrian D. Poteat RN    CC Providers: Toy Cookeyrnest  [place] [address] [place],  [place]  [phone]-   Maeola HarmanJoseph Martavis Gurney MD  [address] [place], [place] [phone]               Electronically signed by Danae OrleansJoseph D. Venetia MaxonStern MD on 04/01/2018 06:58 AM

## 2018-08-10 NOTE — Brief Op Note (Signed)
08/10/2018  3:27 PM  PATIENT:  David Rhodes  63 y.o. male  PRE-OPERATIVE DIAGNOSIS:  Tremor  POST-OPERATIVE DIAGNOSIS:  Tremor  PROCEDURE:  Procedure(s): Replacement of deep brain stimulator pulse generator/left abdomen with long lead extensions (N/A)   SURGEON:  Surgeon(s) and Role:    * Rieley Khalsa, MD - Primary  PHYSICIAN ASSISTANT:   ASSISTANTS: Poteat, RN   ANESTHESIA:   general  EBL:  10 mL   BLOOD ADMINISTERED:none  DRAINS: none   LOCAL MEDICATIONS USED:  MARCAINE    and LIDOCAINE   SPECIMEN:  No Specimen  DISPOSITION OF SPECIMEN:  N/A  COUNTS:  YES  TOURNIQUET:  * No tourniquets in log *  DICTATION: Patient has implanted bilateral VIM Thalamic stimulator electrodes who 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  Due to prior infection, we have elected to place IPG in abdomen, rather than chest.  PROCEDURE: Patient was brought to the operating room and GETA anesthesia was induced.  Left upper chest, scalp, neck, abdomen were prepped with betadine scrub and Duraprep.  Area of planned incision was infiltrated with lidocaine.  Scalp incision was made and the lead extensions were exposed. An incision was made in the left upper chest and an additional incision was made in the left upper quadrant with subcutaneous pocket created.   pocket was created.  Extension tunnel was made from scalp to pocket.  PC IPG was placed and attached to lead extensions, which in turn were connected to cranial leads and torqued appropriately.  Covering boots were placed, clear on left and white on right.    The IPG  was placed in the pocket and sutured into position.  Wounds were irrigated with vancomycin.  Incisions were closed with 2-0 Vicryl and 3-0 vicryl sutures at the pocket and 2-0 vicryl at the scalp with staples. and dressed with a sterile occlusive dressing. Impedances were correct at the end of  the case.  Counts were correct at the end of the case.  PLAN OF CARE: Admit for overnight observation  PATIENT DISPOSITION:  PACU - hemodynamically stable.   Delay start of Pharmacological VTE agent (>24hrs) due to surgical blood loss or risk of bleeding: yes  

## 2018-08-10 NOTE — Progress Notes (Signed)
Pharmacy Antibiotic Note  David Rhodes is a 63 y.o. male admitted on 08/10/2018 for planned replacement of deep brain stimulator.  Pharmacy has been consulted for Vancomycin dosing post-op.  Per discussion with RN, the patient does NOT have a drain in place. Pre-op dose of 1g given at 1200. SCr 1.03, CrCl~70-80 ml/min.   Plan: - Vancomycin 1g IV every 12 hours x 2 doses (as requested per consult) - Pharmacy will sign off as no further doses expected at this time.   Height: 6' (182.9 cm) Weight: 150 lb (68 kg) IBW/kg (Calculated) : 77.6  Temp (24hrs), Avg:98 F (36.7 C), Min:97.4 F (36.3 C), Max:98.9 F (37.2 C)  Recent Labs  Lab 08/09/18 1605  WBC 11.5*  CREATININE 1.03    Estimated Creatinine Clearance: 71.5 mL/min (by C-G formula based on SCr of 1.03 mg/dL).    Allergies  Allergen Reactions  . Penicillins Shortness Of Breath    PATIENT HAS HAD A PCN REACTION WITH IMMEDIATE RASH, FACIAL/TONGUE/THROAT SWELLING, SOB, OR LIGHTHEADEDNESS WITH HYPOTENSION:  #  #  YES  #  #  Has patient had a PCN reaction causing severe rash involving mucus membranes or skin necrosis: No Has patient had a PCN reaction that required hospitalization No Has patient had a PCN reaction occurring within the last 10 years: No If all of the above answers are "NO", then may proceed with Cephalosporin use. **tolerated cefazolin and cephalexin 03/2017    David Rhodes Pager: [phone] 5:40 PM

## 2018-08-10 NOTE — Op Note (Signed)
08/10/2018  3:27 PM  PATIENT:  David Rhodes  63 y.o. male  PRE-OPERATIVE DIAGNOSIS:  Tremor  POST-OPERATIVE DIAGNOSIS:  Tremor  PROCEDURE:  Procedure(s): Replacement of deep brain stimulator pulse generator/left abdomen with long lead extensions (N/A)   SURGEON:  Surgeon(s) and Role:    Maeola Harman, MD - Primary  PHYSICIAN ASSISTANT:   ASSISTANTS: Poteat, RN   ANESTHESIA:   general  EBL:  10 mL   BLOOD ADMINISTERED:none  DRAINS: none   LOCAL MEDICATIONS USED:  MARCAINE    and LIDOCAINE   SPECIMEN:  No Specimen  DISPOSITION OF SPECIMEN:  N/A  COUNTS:  YES  TOURNIQUET:  * No tourniquets in log *  DICTATION: Patient has implanted bilateral VIM Thalamic stimulator electrodes who 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  Due to prior infection, we have elected to place IPG in abdomen, rather than chest.  PROCEDURE: Patient was brought to the operating room and GETA anesthesia was induced.  Left upper chest, scalp, neck, abdomen were prepped with betadine scrub and Duraprep.  Area of planned incision was infiltrated with lidocaine.  Scalp incision was made and the lead extensions were exposed. An incision was made in the left upper chest and an additional incision was made in the left upper quadrant with subcutaneous pocket created.   pocket was created.  Extension tunnel was made from scalp to pocket.  PC IPG was placed and attached to lead extensions, which in turn were connected to cranial leads and torqued appropriately.  Covering boots were placed, clear on left and white on right.    The IPG  was placed in the pocket and sutured into position.  Wounds were irrigated with vancomycin.  Incisions were closed with 2-0 Vicryl and 3-0 vicryl sutures at the pocket and 2-0 vicryl at the scalp with staples. and dressed with a sterile occlusive dressing. Impedances were correct at the end of  the case.  Counts were correct at the end of the case.  PLAN OF CARE: Admit for overnight observation  PATIENT DISPOSITION:  PACU - hemodynamically stable.   Delay start of Pharmacological VTE agent (>24hrs) due to surgical blood loss or risk of bleeding: yes

## 2018-08-10 NOTE — Anesthesia Procedure Notes (Signed)
Procedure Name: Intubation Date/Time: 08/10/2018 1:58 PM Performed by: Elliot Dally, CRNA Pre-anesthesia Checklist: Patient identified, Emergency Drugs available, Suction available and Patient being monitored Patient Re-evaluated:Patient Re-evaluated prior to induction Oxygen Delivery Method: Circle System Utilized Preoxygenation: Pre-oxygenation with 100% oxygen Induction Type: IV induction Ventilation: Mask ventilation without difficulty Laryngoscope Size: Nay and 3 Grade View: Grade I Tube type: Oral Tube size: 7.5 mm Number of attempts: 1 Airway Equipment and Method: Stylet and Oral airway Placement Confirmation: ETT inserted through vocal cords under direct vision,  positive ETCO2 and breath sounds checked- equal and bilateral Secured at: 23 cm Tube secured with: Tape Dental Injury: Teeth and Oropharynx as per pre-operative assessment

## 2018-08-10 NOTE — Progress Notes (Signed)
Awake, alert, conversant.  MAEW.  Doing well.  

## 2018-08-10 NOTE — Anesthesia Postprocedure Evaluation (Signed)
Anesthesia Post Note  Patient: CEAN PHINNEY  Procedure(s) Performed: Replacement of deep brain stimulator pulse generator/left abdomen with long lead extensions (N/A Abdomen)     Patient location during evaluation: PACU Anesthesia Type: General Level of consciousness: awake Pain management: pain level controlled Respiratory status: spontaneous breathing Cardiovascular status: stable Postop Assessment: no apparent nausea or vomiting Anesthetic complications: no    Last Vitals:  Vitals:   08/10/18 1134  BP: (!) 149/105  Pulse: 96  Resp: 20  Temp: 37.2 C  SpO2: 95%    Last Pain:  Vitals:   08/10/18 1202  TempSrc:   PainSc: 4                  Chestina Komatsu

## 2018-08-10 NOTE — Interval H&P Note (Signed)
History and Physical Interval Note:  08/10/2018 1:23 PM  David Rhodes  has presented today for surgery, with the diagnosis of Tremor  The various methods of treatment have been discussed with the patient and family. After consideration of risks, benefits and other options for treatment, the patient has consented to  Procedure(s) with comments: Replacement of deep brain stimulator pulse generator/left abdomen with long lead extensions (N/A) - Replacement of deep brain stimulator pulse generator/left abdomen with long lead extensions as a surgical intervention .  The patient's history has been reviewed, patient examined, no change in status, stable for surgery.  I have reviewed the patient's chart and labs.  Questions were answered to the patient's satisfaction.     Dorian Heckle

## 2018-08-10 NOTE — Transfer of Care (Signed)
Immediate Anesthesia Transfer of Care Note  Patient: David BienenstockRonald E Rhodes  Procedure(s) Performed: Replacement of deep brain stimulator pulse generator/left abdomen with long lead extensions (N/A Abdomen)  Patient Location: PACU  Anesthesia Type:General  Level of Consciousness: drowsy  Airway & Oxygen Therapy: Patient Spontanous Breathing and Patient connected to nasal cannula oxygen  Post-op Assessment: Report given to RN and Post -op Vital signs reviewed and stable  Post vital signs: Reviewed and stable  Last Vitals:  Vitals Value Taken Time  BP 110/68 08/10/2018  3:28 PM  Temp    Pulse 71 08/10/2018  3:29 PM  Resp 12 08/10/2018  3:29 PM  SpO2 99 % 08/10/2018  3:29 PM  Vitals shown include unvalidated device data.  Last Pain:  Vitals:   08/10/18 1202  TempSrc:   PainSc: 4       Patients Stated Pain Goal: 3 (08/10/18 1202)  Complications: No apparent anesthesia complications

## 2018-08-11 DIAGNOSIS — Z462 Encounter for fitting and adjustment of other devices related to nervous system and special senses: Secondary | ICD-10-CM | POA: Diagnosis not present

## 2018-08-11 MED ORDER — HYDROCODONE-ACETAMINOPHEN 5-325 MG PO TABS: 1.0000 | ORAL_TABLET | ORAL | 0 refills | Status: DC | PRN

## 2018-08-11 NOTE — Progress Notes (Signed)
Subjective: Patient reports "I'm great!"  Objective: Vital signs in last 24 hours: Temp:  [97.4 F (36.3 C)-98.9 F (37.2 C)] 97.6 F (36.4 C) (02/12 0736) Pulse Rate:  [61-96] 86 (02/12 0736) Resp:  [12-24] 18 (02/12 0736) BP: (92-149)/(60-105) 131/98 (02/12 0736) SpO2:  [95 %-100 %] 99 % (02/12 0736) Weight:  [68 kg] 68 kg (02/11 1134)  Intake/Output from previous day: 02/11 0701 - 02/12 0700 In: 1750 [I.V.:1550; IV Piggyback:200] Out: 10 [Blood:10] Intake/Output this shift: No intake/output data recorded.  Awakens to voice. Reports some neck and chest soreness as expected. Incisions without erythema swelling, or drainage. Staples left scalp and left chest, dermabond LUQ abdomen. MAEW. Tremor baseline.  Lab Results: Recent Labs    08/09/18 1605  WBC 11.5*  HGB 16.5  HCT 50.8  PLT 271   BMET Recent Labs    08/09/18 1605  NA 139  K 4.3  CL 104  CO2 24  GLUCOSE 97  BUN 15  CREATININE 1.03  CALCIUM 10.4*    Studies/Results: No results found.  Assessment/Plan: Doing well  LOS: 0 days  Ok per Dr. Venetia Maxon to d/c home. (Will omit second Vanc dose) Norco 5/325 will be eRx'ed to his pharmacy for prn home use. Pt will call office to schedule 2 week staple removal and DBS reprogramming. He verbalizes understanding of d/c instructions, including ok to shower, ok to remove drsgs in a couple days, and ok for towel to pad seatbelt in car (encouraged to use seatbelt).    David Rhodes 08/11/2018, 7:56 AM

## 2018-08-11 NOTE — Discharge Summary (Signed)
Physician Discharge Summary  Patient ID: David Rhodes MRN: [id number] DOB/AGE: March 10, 1956 63 y.o.  Admit date: 08/10/2018 Discharge date: 08/11/2018  Admission Diagnoses: Tremor    Discharge Diagnoses: Tremor s/p Replacement of deep brain stimulator pulse generator/left abdomen with long lead extensions (N/A)      Active Problems:   Tremor   Discharged Condition: good  Hospital Course: David Rhodes was admitted for replacement of DBS pulse generator and extensions. Following uncomplicated surgery, he transferred to [facility] for nursing care. He is mobilizing well.  Consults: None  Significant Diagnostic Studies: none  Treatments: surgery: Replacement of deep brain stimulator pulse generator/left abdomen with long lead extensions (N/A)     Discharge Exam: Blood pressure (!) 131/98, pulse 86, temperature 97.6 F (36.4 C), temperature source Oral, resp. rate 18, height 6' (1.829 m), weight 68 kg, SpO2 99 %. Awakens to voice. Reports some neck and chest soreness as expected. Incisions without erythema swelling, or drainage. Staples left scalp and left chest, dermabond LUQ abdomen. MAEW. Tremor baseline.    Disposition: Discharge disposition: 01-Home or Self Care  Norco 5/325 will be eRx'ed to his pharmacy for prn home use. Pt will call office to schedule 2 week staple removal and DBS reprogramming. He verbalizes understanding of d/c instructions, including ok to shower, ok to remove drsgs in a couple days, and ok for towel to pad seatbelt in car (encouraged to use seatbelt).     Discharge Instructions    Diet - low sodium heart healthy   Complete by:  As directed    Diet - low sodium heart healthy   Complete by:  As directed    Increase activity slowly   Complete by:  As directed    Increase activity slowly   Complete by:  As directed      Allergies as of 08/11/2018      Reactions   Penicillins Shortness Of Breath   PATIENT HAS HAD A PCN  REACTION WITH IMMEDIATE RASH, FACIAL/TONGUE/THROAT SWELLING, SOB, OR LIGHTHEADEDNESS WITH HYPOTENSION:  #  #  YES  #  #  Has patient had a PCN reaction causing severe rash involving mucus membranes or skin necrosis: No Has patient had a PCN reaction that required hospitalization No Has patient had a PCN reaction occurring within the last 10 years: No If all of the above answers are "NO", then may proceed with Cephalosporin use. **tolerated cefazolin and cephalexin 03/2017      Medication List    TAKE these medications   albuterol (2.5 MG/3ML) 0.083% nebulizer solution Commonly known as:  PROVENTIL Inhale 2.5 mg into the lungs every 6 (six) hours as needed for wheezing. What changed:  Another medication with the same name was changed. Make sure you understand how and when to take each.   albuterol 108 (90 Base) MCG/ACT inhaler Commonly known as:  PROVENTIL HFA;VENTOLIN HFA INHALE 1 TO 2 PUFFS BY MOUTH EVERY 6 HOURS AS NEEDED FOR WHEEZING OR SHORTNESS OF BREATH What changed:  See the new instructions.   amLODipine 10 MG tablet Commonly known as:  NORVASC TAKE 1 TABLET(10 MG) BY MOUTH DAILY What changed:  See the new instructions.   baclofen 10 MG tablet Commonly known as:  LIORESAL Take 10 mg by mouth 3 (three) times daily.   Fluticasone-Salmeterol 250-50 MCG/DOSE Aepb Commonly known as:  ADVAIR Inhale 1 puff into the lungs 2 (two) times daily.   gabapentin 600 MG tablet Commonly known as:  NEURONTIN Take 600 mg by mouth 4 (  four) times daily.   HYDROcodone-acetaminophen 5-325 MG tablet Commonly known as:  NORCO/VICODIN Take 1-2 tablets by mouth every 4 (four) hours as needed for severe pain ((score 7 to 10)).   lisinopril 40 MG tablet Commonly known as:  PRINIVIL,ZESTRIL TAKE 1 TABLET(40 MG) BY MOUTH DAILY What changed:  See the new instructions.   loratadine 10 MG tablet Commonly known as:  CLARITIN Take 1 tablet (10 mg total) by mouth daily.   montelukast 10 MG  tablet Commonly known as:  SINGULAIR TAKE 1 TABLET(10 MG) BY MOUTH AT BEDTIME What changed:  See the new instructions.   omeprazole 20 MG capsule Commonly known as:  PRILOSEC TAKE 1 CAPSULE BY MOUTH DAILY What changed:    when to take this  reasons to take this   rOPINIRole 2 MG tablet Commonly known as:  REQUIP TAKE 1 TABLET BY MOUTH TWICE DAILY   SPIRIVA HANDIHALER 18 MCG inhalation capsule Generic drug:  tiotropium Place 18 mcg into inhaler and inhale daily.        Signed: Dorian Heckle, MD 08/11/2018, 8:52 AM

## 2018-08-11 NOTE — Progress Notes (Signed)
Pt doing well. Pt given D/C instructions with verbal understanding. Pt's head dressing was changed per MD order. Pt's IV was removed prior to D/C. Pt D/C'd home via wheelchair @ [phone] per MD order. Pt is stable @ D/C and has no other needs at this time. Rema Fendt, RN

## 2018-08-11 NOTE — Progress Notes (Signed)
Occupational Therapy Evaluation Patient Details Name: David Rhodes MRN: [id number] DOB: [birth year] Today's Date: 08/11/2018    History of Present Illness 63 yo male s/p deep brain stimulator replacement 08/09/2018. pt with PMH of HTN, essential tremor, COPD, and chronic pain   Clinical Impression   PTA, pt independent with ADL and mobility. Pt able to complete basic ADL tasks with difficulty due to tremors. Educated on compensatory techniques and modifications to increase independence. Once stimulator turned on, if pt continues to have difficulty with tremors, recommend outpt OT consult to further address compensatory techniques and AE to increase safety and independence with IADL tasks. Pt verbalized understanding of recommendations. .     Follow Up Recommendations  Other (comment)(possible Neuro outpt OT after stimulator turned on if needed)    Equipment Recommendations  None recommended by OT    Recommendations for Other Services       Precautions / Restrictions Precautions Precautions: None Precaution Booklet Issued: No Precaution Comments: pt educated on using small pillow or towel around seatbelt for comfort over abdominal incision Restrictions Weight Bearing Restrictions: No      Mobility Bed Mobility               General bed mobility comments: sitting EOB  Transfers Overall transfer level: Independent Equipment used: None             General transfer comment: pt performed sit to stand transfer independently with no AD    Balance Overall balance assessment: Independent;No apparent balance deficits (not formally assessed)(pt performed backwards walking and walking with turns on his own with no LOB )                                         ADL either performed or assessed with clinical judgement   ADL Overall ADL's : Needs assistance/impaired   Eating/Feeding Details (indicate cue type and reason): Increased spillage due to  tremors; Educated on use of lidded cups; states he uses a "Bubba cup"; offered to try weighted utensils - pt not interested; increased spillage with meal prep; edecated on a few compensatry techniques                                 Functional mobility during ADLs: Modified independent General ADL Comments: Educated pt on compensatory techniques for ADL as LB ADL are difficult due to abdominal pain when bending. Fasteners are difficult due to tremors. Educated on use of reacher to retrieve items form floor when his arm was kept by his side. Pt using an long arm lever which made using the reacher difficult. Again expained to support his arm with his trunk to use the reacher - pt declined. Also educated pt on recommendaqtiaon for "easier" clothing options, i.e fewer fasteners, use of elastic shoe strings; slip on shoes; elastic waste pants, Pt stated he was not interested in "changing his ways"     Vision         Perception     Praxis      Pertinent Vitals/Pain Pain Assessment: Faces Faces Pain Scale: Hurts even more Pain Location: incision /abdomen Pain Descriptors / Indicators: Aching;Discomfort;Grimacing;Guarding Pain Intervention(s): Limited activity within patient's tolerance     Hand Dominance Right   Extremity/Trunk Assessment Upper Extremity Assessment Upper Extremity Assessment: RUE deficits/detail RUE Deficits / Details: R  clavicle injury previously impacts shoulder girdle rhythm; Pt uses compensatory techniques   Lower Extremity Assessment Lower Extremity Assessment: Defer to PT evaluation   Cervical / Trunk Assessment Cervical / Trunk Assessment: Normal   Communication Communication Communication: No difficulties   Cognition Arousal/Alertness: Awake/alert Behavior During Therapy: WFL for tasks assessed/performed Overall Cognitive Status: No family/caregiver present to determine baseline cognitive functioning Area of Impairment:  Attention;Safety/judgement;Problem solving                   Current Attention Level: Selective     Safety/Judgement: Decreased awareness of safety;Decreased awareness of deficits   Problem Solving: Slow processing;Requires verbal cues     General Comments  pt not very receptive to precautions and being conservative, very adament that he is good to go home    Exercises     Shoulder Instructions      Home Living Family/patient expects to be discharged to:: Private residence Living Arrangements: Alone Available Help at Discharge: Friend(s);Available PRN/intermittently Type of Home: Mobile home Home Access: Stairs to enter Entrance Stairs-Number of Steps: 5 Entrance Stairs-Rails: Right Home Layout: One level     Bathroom Shower/Tub: Chief Strategy Officer: Standard Bathroom Accessibility: Yes   Home Equipment: None          Prior Functioning/Environment Level of Independence: Independent        Comments: drives         OT Problem List: Decreased coordination;Decreased safety awareness;Decreased knowledge of use of DME or AE;Impaired UE functional use;Pain      OT Treatment/Interventions:      OT Goals(Current goals can be found in the care plan section) Acute Rehab OT Goals Patient Stated Goal: return home OT Goal Formulation: All assessment and education complete, DC therapy  OT Frequency:     Barriers to D/C:            Co-evaluation              AM-PAC OT "6 Clicks" Daily Activity     Outcome Measure Help from another person eating meals?: None Help from another person taking care of personal grooming?: None Help from another person toileting, which includes using toliet, bedpan, or urinal?: None Help from another person bathing (including washing, rinsing, drying)?: None Help from another person to put on and taking off regular upper body clothing?: None Help from another person to put on and taking off regular lower  body clothing?: None 6 Click Score: 24   End of Session Nurse Communication: Mobility status  Activity Tolerance: Patient tolerated treatment well Patient left: in bed;with call bell/phone within reach  OT Visit Diagnosis: Pain Pain - part of body: (abdomen)                Time: 0825-0900 OT Time Calculation (min): 35 min Charges:  OT General Charges $OT Visit: 1 Visit OT Evaluation $OT Eval Low Complexity: 1 Low OT Treatments $Self Care/Home Management : 8-22 mins  Luisa Dago, OT/L   Acute OT Clinical Specialist Acute Rehabilitation Services Pager [phone] Office [phone]   [facility] 08/11/2018, 10:37 AM

## 2018-08-11 NOTE — Evaluation (Signed)
Physical Therapy Evaluation/Discharge Patient Details Name: David Rhodes MRN: [id number] DOB: [birth year] Today's Date: 08/11/2018   History of Present Illness  63 yo male s/p deep brain stimulator replacement 08/09/2018. pt with PMH of HTN, essential tremor, COPD, and chronic pain  Clinical Impression  Pt is a 63yo male who is extremely eager to go home. Pt presents with mild pain around incision but states he can manage it. Pt presents with decreased safety awareness, judgement and insight to deficits. Pt was resistant to education from therapy. Pt performed all functional mobility independently with no AD or therapist assistance. Pt is safe for discharge home. Plan was discussed with pt and he agreed.     Follow Up Recommendations No PT follow up    Equipment Recommendations  None recommended by PT    Recommendations for Other Services       Precautions / Restrictions Precautions Precautions: Back Precaution Booklet Issued: No Precaution Comments: pt educated on using small pillow or towel around seatbelt for comfort over abdominal incision Restrictions Weight Bearing Restrictions: No      Mobility  Bed Mobility               General bed mobility comments: pt already up and getting dressed in room upon arrival   Transfers Overall transfer level: Independent Equipment used: None             General transfer comment: pt performed sit to stand transfer independently with no AD  Ambulation/Gait Ambulation/Gait assistance: Independent  Gait Distance (Feet): 400 Feet Assistive device: None Gait Pattern/deviations: Step-through pattern;WFL(Within Functional Limits) Gait velocity: normal    General Gait Details: pt ambulates with normal gait pattern and no LOB, pt walked backwards and turned in circles while ambulating on his own to prove that he was ready to go home  Stairs Stairs: Yes Stairs assistance: Independent Stair Management: One rail Right Number  of Stairs: 12 General stair comments: pt ascended stairs two at a time despite therapist instruction to ascend one at a time, pt descended full flight one at a time with a reciprocal gait pattern  Wheelchair Mobility    Modified Rankin (Stroke Patients Only)       Balance Overall balance assessment: Independent(pt performed backwards walking and walking with turns on his own with no LOB )                                           Pertinent Vitals/Pain Pain Assessment: Faces Faces Pain Scale: Hurts a little bit Pain Location: incision  Pain Descriptors / Indicators: Aching, Sharp pain at incision during coughing  Pain Intervention(s): Monitored during session    Home Living Family/patient expects to be discharged to:: Private residence Living Arrangements: Alone Available Help at Discharge: Friend(s);Available PRN/intermittently Type of Home: Mobile home Home Access: Stairs to enter Entrance Stairs-Rails: Right Entrance Stairs-Number of Steps: 5 Home Layout: One level Home Equipment: None      Prior Function Level of Independence: Independent               Hand Dominance   Dominant Hand: Right    Extremity/Trunk Assessment   Upper Extremity Assessment Upper Extremity Assessment: Overall WFL for tasks assessed    Lower Extremity Assessment Lower Extremity Assessment: Overall WFL for tasks assessed    Cervical / Trunk Assessment Cervical / Trunk Assessment: Normal  Communication  Communication: No difficulties  Cognition Arousal/Alertness: Awake/alert Behavior During Therapy: WFL for tasks assessed/performed Overall Cognitive Status: Impaired/Different from baseline Area of Impairment: Attention;Safety/judgement;Problem solving                   Current Attention Level: Selective;Alternating     Safety/Judgement: Decreased awareness of safety;Decreased awareness of deficits   Problem Solving: Slow processing;Requires  verbal cues        General Comments General comments (skin integrity, edema, etc.): pt not receptive to precautions, safety concerns or education from therapists, very adament that he is good to go home    Exercises     Assessment/Plan    PT Assessment Patent does not need any further PT services  PT Problem List         PT Treatment Interventions      PT Goals (Current goals can be found in the Care Plan section)  Acute Rehab PT Goals Patient Stated Goal: return home PT Goal Formulation: With patient Time For Goal Achievement: 08/18/18 Potential to Achieve Goals: Good    Frequency     Barriers to discharge        Co-evaluation               AM-PAC PT "6 Clicks" Mobility  Outcome Measure Help needed turning from your back to your side while in a flat bed without using bedrails?: None Help needed moving from lying on your back to sitting on the side of a flat bed without using bedrails?: None Help needed moving to and from a bed to a chair (including a wheelchair)?: None Help needed standing up from a chair using your arms (e.g., wheelchair or bedside chair)?: None Help needed to walk in hospital room?: None Help needed climbing 3-5 steps with a railing? : None 6 Click Score: 24    End of Session Equipment Utilized During Treatment: Gait belt Activity Tolerance: Patient tolerated treatment well Patient left: in chair;with call bell/phone within reach Nurse Communication: Mobility status PT Visit Diagnosis: Other abnormalities of gait and mobility (R26.89)    Time: 6861-6837 PT Time Calculation (min) (ACUTE ONLY): 14 min   Charges:              Karoline Caldwell, SPT [phone]   Sumeet Geter 08/11/2018, 10:05 AM

## 2018-08-13 ENCOUNTER — Encounter (HOSPITAL_COMMUNITY): Payer: Self-pay | Admitting: Neurosurgery

## 2018-08-31 ENCOUNTER — Other Ambulatory Visit: Payer: Self-pay | Admitting: Internal Medicine

## 2018-09-15 ENCOUNTER — Other Ambulatory Visit: Payer: Self-pay | Admitting: Internal Medicine

## 2018-10-02 ENCOUNTER — Other Ambulatory Visit: Payer: Self-pay | Admitting: Internal Medicine

## 2018-10-05 ENCOUNTER — Encounter: Payer: Self-pay | Admitting: Internal Medicine

## 2018-10-05 ENCOUNTER — Other Ambulatory Visit
Admission: RE | Admit: 2018-10-05 | Discharge: 2018-10-05 | Disposition: A | Discharge status: Home / Self Care | Payer: Medicare Other | Source: Home / Self Care | Attending: Internal Medicine | Admitting: Internal Medicine

## 2018-10-05 ENCOUNTER — Ambulatory Visit (INDEPENDENT_AMBULATORY_CARE_PROVIDER_SITE_OTHER): Payer: Medicare Other | Admitting: Internal Medicine

## 2018-10-05 ENCOUNTER — Other Ambulatory Visit: Payer: Self-pay

## 2018-10-05 ENCOUNTER — Ambulatory Visit
Admission: RE | Admit: 2018-10-05 | Discharge: 2018-10-05 | Disposition: A | Discharge status: Home / Self Care | Payer: Medicare Other | Source: Ambulatory Visit | Attending: Internal Medicine | Admitting: Internal Medicine

## 2018-10-05 ENCOUNTER — Ambulatory Visit
Admission: RE | Admit: 2018-10-05 | Discharge: 2018-10-05 | Disposition: A | Discharge status: Home / Self Care | Payer: Medicare Other | Attending: Internal Medicine | Admitting: Internal Medicine

## 2018-10-05 VITALS — BP 112/74 | HR 92 | Resp 16 | Ht 72.0 in | Wt 155.0 lb

## 2018-10-05 DIAGNOSIS — M25512 Pain in left shoulder: Secondary | ICD-10-CM

## 2018-10-05 DIAGNOSIS — R1031 Right lower quadrant pain: Secondary | ICD-10-CM

## 2018-10-05 DIAGNOSIS — J439 Emphysema, unspecified: Secondary | ICD-10-CM

## 2018-10-05 DIAGNOSIS — Z1159 Encounter for screening for other viral diseases: Secondary | ICD-10-CM

## 2018-10-05 DIAGNOSIS — G8929 Other chronic pain: Secondary | ICD-10-CM

## 2018-10-05 DIAGNOSIS — I10 Essential (primary) hypertension: Secondary | ICD-10-CM | POA: Diagnosis not present

## 2018-10-05 LAB — CBC WITH DIFFERENTIAL/PLATELET
Abs Immature Granulocytes: 0.04 10*3/uL (ref 0.00–0.07)
Basophils Absolute: 0.2 10*3/uL — ABNORMAL HIGH (ref 0.0–0.1)
Basophils Relative: 2 %
Eosinophils Absolute: 1.7 10*3/uL — ABNORMAL HIGH (ref 0.0–0.5)
Eosinophils Relative: 17 %
HCT: 48.6 % (ref 39.0–52.0)
Hemoglobin: 16.1 g/dL (ref 13.0–17.0)
Immature Granulocytes: 0 %
Lymphocytes Relative: 23 %
Lymphs Abs: 2.3 10*3/uL (ref 0.7–4.0)
MCH: 29.6 pg (ref 26.0–34.0)
MCHC: 33.1 g/dL (ref 30.0–36.0)
MCV: 89.3 fL (ref 80.0–100.0)
Monocytes Absolute: 0.8 10*3/uL (ref 0.1–1.0)
Monocytes Relative: 8 %
Neutro Abs: 5.2 10*3/uL (ref 1.7–7.7)
Neutrophils Relative %: 50 %
Platelets: 211 10*3/uL (ref 150–400)
RBC: 5.44 MIL/uL (ref 4.22–5.81)
RDW: 13.7 % (ref 11.5–15.5)
WBC: 10.2 10*3/uL (ref 4.0–10.5)
nRBC: 0 % (ref 0.0–0.2)

## 2018-10-05 LAB — COMPREHENSIVE METABOLIC PANEL
ALT: 15 U/L (ref 0–44)
AST: 18 U/L (ref 15–41)
Albumin: 4 g/dL (ref 3.5–5.0)
Alkaline Phosphatase: 83 U/L (ref 38–126)
Anion gap: 5 (ref 5–15)
BUN: 19 mg/dL (ref 8–23)
CO2: 27 mmol/L (ref 22–32)
Calcium: 9.9 mg/dL (ref 8.9–10.3)
Chloride: 108 mmol/L (ref 98–111)
Creatinine, Ser: 0.98 mg/dL (ref 0.61–1.24)
GFR calc Af Amer: >60 mL/min (ref >60)
GFR calc non Af Amer: >60 mL/min (ref >60)
Glucose, Bld: 78 mg/dL (ref 70–99)
Potassium: 4.1 mmol/L (ref 3.5–5.1)
Sodium: 140 mmol/L (ref 135–145)
Total Bilirubin: 0.5 mg/dL (ref 0.3–1.2)
Total Protein: 7 g/dL (ref 6.5–8.1)

## 2018-10-05 MED ORDER — LISINOPRIL 40 MG PO TABS: ORAL_TABLET | ORAL | 1 refills | Status: DC

## 2018-10-05 NOTE — Progress Notes (Signed)
Date:  10/05/2018   Name:  David Rhodes   DOB:  [birth year]   MRN:  [id number]   Chief Complaint: Arm Pain (L and R arm pain chronic issue since accident in 1999); Hernia (Feels he has reinjured hernia ); and Hypertension  Hypertension  This is a chronic problem. The current episode started more than 1 year ago. Associated symptoms include shortness of breath. Pertinent negatives include no chest pain, headaches or palpitations. Past treatments include ACE inhibitors.   Shoulder pain - he had cervical radiculopathy sx on the right for years.  He has relied more on his left arm and now that is painful.  He has decreased ROM and pain in the joint but no tingling or weakness.  COPD - he uses albuterol, he does not get benefit from using the nebulizer, only if he get a treatment at [facility] using oxygen.  We discussed that this is not possible at home.  I recommend using a spacer instead.  Groin pain - he has had bilateral inguinal hernia repairs, in 2015.  His dog jumps up on his abdomen and now he has intermittent pain and thinks maybe the hernia is back.  He denies bulge, testicular pain or swelling, change in bowel habits.  Lab Results  Component Value Date   WBC 11.5 (H) 08/09/2018   HGB 16.5 08/09/2018   HCT 50.8 08/09/2018   MCV 88.0 08/09/2018   PLT 271 08/09/2018   Lab Results  Component Value Date   CREATININE 1.03 08/09/2018   BUN 15 08/09/2018   NA 139 08/09/2018   K 4.3 08/09/2018   CL 104 08/09/2018   CO2 24 08/09/2018    Review of Systems  Constitutional: Negative for chills, fatigue and fever.  Respiratory: Positive for cough and shortness of breath. Negative for wheezing.   Cardiovascular: Negative for chest pain, palpitations and leg swelling.  Gastrointestinal: Positive for abdominal pain (right lower abdomen). Negative for anal bleeding and rectal pain.  Musculoskeletal: Positive for arthralgias, back pain, myalgias and neck stiffness.  Neurological: Negative for  headaches.    Patient Active Problem List   Diagnosis Date Noted  . Tremor 08/10/2018  . PICC (peripherally inserted central catheter) in place 04/16/2018  . Staph aureus infection 04/08/2018  . 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 Essential tremor 10/18/2017  . Cervical radiculopathy 07/23/2017  . Bilateral temporomandibular joint pain 07/23/2017  . Neck pain 05/19/2017  . Medication monitoring encounter 09/11/2016  . Pulmonary nodules 12/31/2015  . COPD (chronic obstructive pulmonary disease) (HCC) 12/27/2015  . Chronic cough 12/27/2015  . Asthma 09/24/2015  . Allergic rhinitis 11/11/2014  . Nerve root pain 11/11/2014  . Dyskinesia 11/11/2014  . Acid reflux 11/11/2014  . Calcium blood increased 11/11/2014  . Affective disorder (HCC) 11/11/2014  . Essential hypertension 12/25/2011  . Chronic back pain 12/25/2011    Allergies  Allergen Reactions  . Penicillins Shortness Of Breath    PATIENT HAS HAD A PCN REACTION WITH IMMEDIATE RASH, FACIAL/TONGUE/THROAT SWELLING, SOB, OR LIGHTHEADEDNESS WITH HYPOTENSION:  #  #  YES  #  #  Has patient had a PCN reaction causing severe rash involving mucus membranes or skin necrosis: No Has patient had a PCN reaction that required hospitalization No Has patient had a PCN reaction occurring within the last 10 years: No If all of the above answers are "NO", then may proceed with Cephalosporin use. **tolerated cefazolin and cephalexin 03/2017    Past Surgical History:  Procedure Laterality Date  . DEEP BRAIN STIMULATOR PLACEMENT    . INGUINAL HERNIA REPAIR Bilateral 02/07/2014   Procedure:  BILATERAL INGUINAL HERNIA REPAIR;  Surgeon: Axel Filler, MD;  Location: [facility] OR;  Service: General;  Laterality: Bilateral;  . INSERTION OF MESH Bilateral 02/07/2014   Procedure: INSERTION OF MESH;  Surgeon: Axel Filler, MD;  Location: [facility] OR;  Service: General;  Laterality: Bilateral;  . NASAL SINUS SURGERY    . PULSE GENERATOR IMPLANT  Left 09/11/2016   Procedure: Left chest-Change implantable pulse generator battery;  Surgeon: Maeola Harman, MD;  Location: [facility] OR;  Service: Neurosurgery;  Laterality: Left;  Left chest-Change implantable pulse generator battery  . PULSE GENERATOR IMPLANT Left 04/06/2018   Procedure: Revision of left chest implantable pulse generator, extension, and pocket adapter;  Surgeon: Maeola Harman, MD;  Location: [facility] OR;  Service: Neurosurgery;  Laterality: Left;  . SUBTHALAMIC STIMULATOR BATTERY REPLACEMENT  12/09/2011   Procedure: SUBTHALAMIC STIMULATOR BATTERY REPLACEMENT;  Surgeon: Maeola Harman, MD;  Location: [facility] NEURO ORS;  Service: Neurosurgery;  Laterality: N/A;   deep brain stimulator, implantable pulse generator change  . SUBTHALAMIC STIMULATOR BATTERY REPLACEMENT Left 04/21/2014   Procedure: Deep brain stimulator battery change;  Surgeon: Maeola Harman, MD;  Location: [facility] NEUR[facility] OR[facility];  Service: Neurosurgery;  Laterality: Left;  Deep brain stimulator battery change  . SUBTHALAMIC STIMULATOR BATTERY REPLACEMENT N/A 08/10/2018   Procedure: Replacement of deep brain stimulator pulse generator/left abdomen with long lead extensions;  Surgeon: Maeola Harman, MD;  Location: [facility] OR;  Service: Neurosurgery;  Laterality: N/A;    Social History   Tobacco Use  . Smoking status: Never Smoker  . Smokeless tobacco: Current User    Types: Snuff  Substance Use Topics  . Alcohol use: No    Alcohol/week: 0.0 standard drinks  . Drug use: No     Medication list has been reviewed and updated.  Current Meds  Medication Sig  . albuterol (PROVENTIL HFA;VENTOLIN HFA) 108 (90 Base) MCG/ACT inhaler INHALE 1 TO 2 PUFFS BY MOUTH EVERY 6 HOURS AS NEEDED FOR WHEEZING OR SHORTNESS OF BREATH (Patient taking differently: Inhale 1-2 puffs into the lungs every 6 (six) hours as needed for wheezing or shortness of breath. )  . albuterol (PROVENTIL) (2.5 MG/3ML) 0.083% nebulizer solution Inhale 2.5 mg into the lungs every 6 (six) hours  as needed for wheezing.  Marland Kitchen amLODipine (NORVASC) 10 MG tablet TAKE 1 TABLET(10 MG) BY MOUTH DAILY (Patient taking differently: Take 10 mg by mouth daily. )  . baclofen (LIORESAL) 10 MG tablet Take 10 mg by mouth 3 (three) times daily.  . Fluticasone-Salmeterol (ADVAIR) 250-50 MCG/DOSE AEPB Inhale 1 puff into the lungs 2 (two) times daily.  Marland Kitchen gabapentin (NEURONTIN) 600 MG tablet Take 600 mg by mouth 4 (four) times daily.   Marland Kitchen HYDROcodone-acetaminophen (NORCO/VICODIN) 5-325 MG tablet Take 1-2 tablets by mouth every 4 (four) hours as needed for severe pain ((score 7 to 10)).  Marland Kitchen lisinopril (PRINIVIL,ZESTRIL) 40 MG tablet TAKE 1 TABLET(40 MG) BY MOUTH DAILY (Patient taking differently: Take 40 mg by mouth daily. )  . loratadine (CLARITIN) 10 MG tablet Take 1 tablet (10 mg total) by mouth daily.  . montelukast (SINGULAIR) 10 MG tablet TAKE 1 TABLET(10 MG) BY MOUTH AT BEDTIME (Patient taking differently: Take 10 mg by mouth at bedtime. )  . omeprazole (PRILOSEC) 20 MG capsule TAKE 1 CAPSULE BY MOUTH DAILY (Patient taking differently: Take 20 mg by mouth daily as needed (acid reflux). )  .  rOPINIRole (REQUIP) 2 MG tablet TAKE 1 TABLET BY MOUTH TWICE DAILY (Patient taking differently: Take 2 mg by mouth 2 (two) times daily. )  . SPIRIVA HANDIHALER 18 MCG inhalation capsule INHALE CONTENTS OF 1 CAPSULE ONCE DAILY USING HANDIHALER    PHQ 2/9 Scores 10/05/2018 11/02/2017  PHQ - 2 Score 0 0    BP Readings from Last 3 Encounters:  10/05/18 112/74  08/11/18 (!) 131/98  08/09/18 (!) 130/92    Physical Exam Vitals signs and nursing note reviewed.  Constitutional:      General: He is not in acute distress.    Appearance: He is well-developed.  HENT:     Head: Normocephalic and atraumatic.  Cardiovascular:     Rate and Rhythm: Normal rate and regular rhythm.     Pulses: Normal pulses.  Pulmonary:     Effort: Pulmonary effort is normal. No respiratory distress.     Breath sounds: Normal breath sounds. No  wheezing or rales.  Abdominal:     General: Abdomen is scaphoid. Bowel sounds are normal.     Palpations: Abdomen is soft.     Tenderness: There is no abdominal tenderness. There is no guarding or rebound.     Hernia: No hernia is present. There is no hernia in the right inguinal area or left inguinal area.    Musculoskeletal:     Right shoulder: He exhibits decreased range of motion. He exhibits no tenderness and no bony tenderness.     Left shoulder: He exhibits decreased range of motion, tenderness and bony tenderness. He exhibits no swelling, no effusion and no deformity.  Lymphadenopathy:     Cervical: No cervical adenopathy.  Skin:    General: Skin is warm and dry.     Findings: No rash.  Neurological:     Mental Status: He is alert and oriented to person, place, and time.  Psychiatric:        Behavior: Behavior normal.        Thought Content: Thought content normal.     Wt Readings from Last 3 Encounters:  10/05/18 155 lb (70.3 kg)  08/10/18 150 lb (68 kg)  05/18/18 166 lb (75.3 kg)    BP 112/74   Pulse 92   Resp 16   Ht 6' (1.829 m)   Wt 155 lb (70.3 kg)   SpO2 98%   BMI 21.02 kg/m   Assessment and Plan: 1. Essential hypertension Controlled, continue lisinopril alone; d/c norvasc - lisinopril (PRINIVIL,ZESTRIL) 40 MG tablet; TAKE 1 TABLET(40 MG) BY MOUTH DAILY  Dispense: 90 tablet; Refill: 1 - CBC with Differential/Platelet - Comprehensive metabolic panel  2. Groin pain, right No evidence of hernia; suspect muscular  3. Chronic left shoulder pain Need xray to evaluate for OA, bone spurring, etc - DG Shoulder Left; Future  4. Pulmonary emphysema, unspecified emphysema type (HCC) Continue to use albuterol MDI - use spacer instead of nebulizer  5. Need for hepatitis C screening test - Hepatitis C antibody   Partially dictated using Animal nutritionistDragon software. Any errors are unintentional.  Bari EdwardLaura Itati Brocksmith, MD [facility]   10/05/2018

## 2018-10-05 NOTE — Patient Instructions (Addendum)
This information is directly available on the CDC website: https://www.cdc.gov/coronavirus/2019-ncov/if-you-are-sick/steps-when-sick.html    Source:CDC Reference to specific commercial products, manufacturers, companies, or trademarks does not constitute its endorsement or recommendation by the U.S. Government, Department of Health and Human Services, or Centers for Disease Control and Prevention.  

## 2018-10-06 ENCOUNTER — Telehealth: Payer: Self-pay

## 2018-10-06 ENCOUNTER — Other Ambulatory Visit: Payer: Self-pay | Admitting: Internal Medicine

## 2018-10-06 DIAGNOSIS — M25512 Pain in left shoulder: Principal | ICD-10-CM

## 2018-10-06 DIAGNOSIS — G8929 Other chronic pain: Secondary | ICD-10-CM

## 2018-10-06 LAB — HEPATITIS C ANTIBODY: HCV Ab: <0.1 s/co ratio (ref 0.0–0.9)

## 2018-10-06 NOTE — Telephone Encounter (Signed)
Wants referral to Ortho for the shoulder pain. See last films. [facility]

## 2018-10-10 ENCOUNTER — Other Ambulatory Visit: Payer: Self-pay | Admitting: Internal Medicine

## 2018-10-29 ENCOUNTER — Other Ambulatory Visit: Payer: Self-pay

## 2018-10-29 ENCOUNTER — Encounter: Payer: Self-pay | Admitting: Emergency Medicine

## 2018-10-29 ENCOUNTER — Emergency Department
Admission: EM | Admit: 2018-10-29 | Discharge: 2018-10-29 | Disposition: A | Discharge status: Home / Self Care | Payer: Medicare Other | Attending: Emergency Medicine | Admitting: Emergency Medicine

## 2018-10-29 ENCOUNTER — Emergency Department: Payer: Medicare Other

## 2018-10-29 DIAGNOSIS — I10 Essential (primary) hypertension: Secondary | ICD-10-CM | POA: Insufficient documentation

## 2018-10-29 DIAGNOSIS — J441 Chronic obstructive pulmonary disease with (acute) exacerbation: Secondary | ICD-10-CM | POA: Diagnosis not present

## 2018-10-29 DIAGNOSIS — Z79899 Other long term (current) drug therapy: Secondary | ICD-10-CM | POA: Insufficient documentation

## 2018-10-29 DIAGNOSIS — J45909 Unspecified asthma, uncomplicated: Secondary | ICD-10-CM | POA: Diagnosis not present

## 2018-10-29 DIAGNOSIS — Z20828 Contact with and (suspected) exposure to other viral communicable diseases: Secondary | ICD-10-CM | POA: Insufficient documentation

## 2018-10-29 DIAGNOSIS — F17220 Nicotine dependence, chewing tobacco, uncomplicated: Secondary | ICD-10-CM | POA: Insufficient documentation

## 2018-10-29 DIAGNOSIS — R05 Cough: Secondary | ICD-10-CM | POA: Diagnosis present

## 2018-10-29 LAB — COMPREHENSIVE METABOLIC PANEL
ALT: 15 U/L (ref 0–44)
AST: 18 U/L (ref 15–41)
Albumin: 4 g/dL (ref 3.5–5.0)
Alkaline Phosphatase: 85 U/L (ref 38–126)
Anion gap: 8 (ref 5–15)
BUN: 14 mg/dL (ref 8–23)
CO2: 28 mmol/L (ref 22–32)
Calcium: 10.4 mg/dL — ABNORMAL HIGH (ref 8.9–10.3)
Chloride: 103 mmol/L (ref 98–111)
Creatinine, Ser: 1.14 mg/dL (ref 0.61–1.24)
GFR calc Af Amer: >60 mL/min (ref >60)
GFR calc non Af Amer: >60 mL/min (ref >60)
Glucose, Bld: 111 mg/dL — ABNORMAL HIGH (ref 70–99)
Potassium: 4 mmol/L (ref 3.5–5.1)
Sodium: 139 mmol/L (ref 135–145)
Total Bilirubin: 0.5 mg/dL (ref 0.3–1.2)
Total Protein: 7 g/dL (ref 6.5–8.1)

## 2018-10-29 LAB — CBC
HCT: 45 % (ref 39.0–52.0)
Hemoglobin: 14.8 g/dL (ref 13.0–17.0)
MCH: 29.4 pg (ref 26.0–34.0)
MCHC: 32.9 g/dL (ref 30.0–36.0)
MCV: 89.5 fL (ref 80.0–100.0)
Platelets: 238 10*3/uL (ref 150–400)
RBC: 5.03 MIL/uL (ref 4.22–5.81)
RDW: 13.3 % (ref 11.5–15.5)
WBC: 13.9 10*3/uL — ABNORMAL HIGH (ref 4.0–10.5)
nRBC: 0 % (ref 0.0–0.2)

## 2018-10-29 LAB — SARS CORONAVIRUS 2 BY RT PCR (HOSPITAL ORDER, PERFORMED IN ~~LOC~~ HOSPITAL LAB): SARS Coronavirus 2: NEGATIVE

## 2018-10-29 LAB — TROPONIN I: Troponin I: <0.03 ng/mL (ref <0.03)

## 2018-10-29 MED ORDER — AZITHROMYCIN 500 MG PO TABS: 500.0000 mg | ORAL_TABLET | Freq: Every day | ORAL | 0 refills | Status: AC

## 2018-10-29 MED ORDER — ALBUTEROL SULFATE (2.5 MG/3ML) 0.083% IN NEBU
2.5000 mg | INHALATION_SOLUTION | Freq: Once | RESPIRATORY_TRACT | Status: AC
  Administered 2018-10-29: 04:00:00 2.5 mg via RESPIRATORY_TRACT

## 2018-10-29 MED ORDER — ALBUTEROL SULFATE (2.5 MG/3ML) 0.083% IN NEBU
INHALATION_SOLUTION | RESPIRATORY_TRACT | Status: AC
  Filled 2018-10-29: qty 6

## 2018-10-29 MED ORDER — PREDNISONE 20 MG PO TABS: 60.0000 mg | ORAL_TABLET | Freq: Every day | ORAL | 0 refills | Status: AC

## 2018-10-29 MED ORDER — IPRATROPIUM-ALBUTEROL 0.5-2.5 (3) MG/3ML IN SOLN
3.0000 mL | Freq: Once | RESPIRATORY_TRACT | Status: AC
  Administered 2018-10-29: 3 mL via RESPIRATORY_TRACT
  Filled 2018-10-29: qty 6

## 2018-10-29 MED ORDER — IPRATROPIUM-ALBUTEROL 0.5-2.5 (3) MG/3ML IN SOLN
3.0000 mL | Freq: Once | RESPIRATORY_TRACT | Status: AC
  Administered 2018-10-29: 3 mL via RESPIRATORY_TRACT

## 2018-10-29 MED ORDER — METHYLPREDNISOLONE SODIUM SUCC 125 MG IJ SOLR
125.0000 mg | Freq: Once | INTRAMUSCULAR | Status: AC
  Administered 2018-10-29: 125 mg via INTRAVENOUS
  Filled 2018-10-29: qty 2

## 2018-10-29 MED ORDER — ALBUTEROL SULFATE (2.5 MG/3ML) 0.083% IN NEBU
2.5000 mg | INHALATION_SOLUTION | Freq: Once | RESPIRATORY_TRACT | Status: AC
  Administered 2018-10-29: 2.5 mg via RESPIRATORY_TRACT

## 2018-10-29 NOTE — ED Triage Notes (Signed)
Pt to triage via w/c--congested cough, prolonged expiration and SHOB noted with exertion; st hx asthma and COPD; reports prod cough beige sputum with SHOB x 2-81mos; using inhalers without relief; denies fever, denies pain at present

## 2018-10-29 NOTE — ED Notes (Signed)
Patient states he has been SOB with cough for a few day. Patient states he has been using his inhaler with no effect. Patient states having a brain stimulator by [employer].

## 2018-10-29 NOTE — ED Provider Notes (Addendum)
[facility] Emergency Department Provider Note    First MD Initiated Contact with Patient 10/29/18 0240     (approximate)  I have reviewed the triage vital signs and the nursing notes.   HISTORY  Chief Complaint Cough    HPI David Rhodes is a 63 y.o. male with below list of previous medical conditions occluding COPD presents to the emergency department with progressive dyspnea over the past 2 to 3 months which patient states is worse tonight.  Patient does admit to productive cough with beige sputum.  Patient denies any fever no known sick contact.  Patient denies any chest pain.  Patient does admit to congestion.        Past Medical History:  Diagnosis Date   Allergy    Hay fever   Anxiety    Asthma    Chronic pain due to trauma    COPD (chronic obstructive pulmonary disease) (HCC)    Depression    Essential tremor    GERD (gastroesophageal reflux disease)    Headache(784.0)    neck pain   Hypertension    Inguinal hernia    bilateral   Neuromuscular disorder (HCC)    hx of tremors   Pneumonia    many years ago   Shortness of breath    when allergies flare up   Urine incontinence    Wears glasses     Patient Active Problem List   Diagnosis Date Noted   Chronic left shoulder pain 10/05/2018   Tremor 08/10/2018   PICC (peripherally inserted central catheter) in place 04/16/2018   Staph aureus infection 04/08/2018   Infection of chest IPG pocket (HCC) 04/06/2018   Essential tremor 10/18/2017   Cervical radiculopathy 07/23/2017   Bilateral temporomandibular joint pain 07/23/2017   Neck pain 05/19/2017   Medication monitoring encounter 09/11/2016   Pulmonary nodules 12/31/2015   COPD (chronic obstructive pulmonary disease) (HCC) 12/27/2015   Chronic cough 12/27/2015   Asthma 09/24/2015   Allergic rhinitis 11/11/2014   Nerve root pain 11/11/2014   Dyskinesia 11/11/2014   Acid reflux 11/11/2014    Calcium blood increased 11/11/2014   Affective disorder (HCC) 11/11/2014   Essential hypertension 12/25/2011   Chronic back pain 12/25/2011    Past Surgical History:  Procedure Laterality Date   DEEP BRAIN STIMULATOR PLACEMENT     INGUINAL HERNIA REPAIR Bilateral 02/07/2014   Procedure:  BILATERAL INGUINAL HERNIA REPAIR;  Surgeon: Axel FillerArmando Ramirez, MD;  Location: [facility] OR;  Service: General;  Laterality: Bilateral;   INSERTION OF MESH Bilateral 02/07/2014   Procedure: INSERTION OF MESH;  Surgeon: Axel FillerArmando Ramirez, MD;  Location: [facility] OR;  Service: General;  Laterality: Bilateral;   NASAL SINUS SURGERY     PULSE GENERATOR IMPLANT Left 09/11/2016   Procedure: Left chest-Change implantable pulse generator battery;  Surgeon: Maeola HarmanJoseph Stern, MD;  [facility];  Service: Neurosurgery;  Laterality: Left;  Left chest-Change implantable pulse generator battery   PULSE GENERATOR IMPLANT Left 04/06/2018   Procedure: Revision of left chest implantable pulse generator, extension, and pocket adapter;  Surgeon: Maeola HarmanStern, Joseph, MD;  [facility];  Service: Neurosurgery;  Laterality: Left;   SUBTHALAMIC STIMULATOR BATTERY REPLACEMENT  12/09/2011   Procedure: SUBTHALAMIC STIMULATOR BATTERY REPLACEMENT;  Surgeon: Maeola HarmanJoseph Stern, MD;  Location: [facility] NEURO ORS;  Service: Neurosurgery;  Laterality: N/A;   deep brain stimulator, implantable pulse generator change   SUBTHALAMIC STIMULATOR BATTERY REPLACEMENT Left 04/21/2014   Procedure: Deep brain stimulator battery change;  Surgeon: Jomarie LongsJoseph  Venetia Maxon, MD;  Location: [facility] NEURO ORS;  Service: Neurosurgery;  Laterality: Left;  Deep brain stimulator battery change   SUBTHALAMIC STIMULATOR BATTERY REPLACEMENT N/A 08/10/2018   Procedure: Replacement of deep brain stimulator pulse generator/left abdomen with long lead extensions;  Surgeon: Maeola Harman, MD;  Location: [facility] OR;  Service: Neurosurgery;  Laterality: N/A;    Prior to Admission medications   Medication Sig Start  Date End Date Taking? Authorizing Provider  albuterol (PROVENTIL HFA;VENTOLIN HFA) 108 (90 Base) MCG/ACT inhaler INHALE 1 TO 2 PUFFS BY MOUTH EVERY 6 HOURS AS NEEDED FOR WHEEZING OR SHORTNESS OF BREATH Patient taking differently: Inhale 1-2 puffs into the lungs every 6 (six) hours as needed for wheezing or shortness of breath.  07/02/18   Reubin Milan, MD  albuterol (PROVENTIL) (2.5 MG/3ML) 0.083% nebulizer solution Inhale 2.5 mg into the lungs every 6 (six) hours as needed for wheezing. 10/19/17 10/19/18  [provider]  baclofen (LIORESAL) 10 MG tablet Take 10 mg by mouth 3 (three) times daily. 03/04/18   [provider]  Fluticasone-Salmeterol (ADVAIR) 250-50 MCG/DOSE AEPB Inhale 1 puff into the lungs 2 (two) times daily. 10/19/17 10/19/18  [provider]  gabapentin (NEURONTIN) 600 MG tablet Take 600 mg by mouth 4 (four) times daily.     [provider]  HYDROcodone-acetaminophen (NORCO/VICODIN) 5-325 MG tablet Take 1-2 tablets by mouth every 4 (four) hours as needed for severe pain ((score 7 to 10)). 08/11/18   Maeola Harman, MD  lisinopril (PRINIVIL,ZESTRIL) 40 MG tablet TAKE 1 TABLET(40 MG) BY MOUTH DAILY 10/05/18   Reubin Milan, MD  loratadine (CLARITIN) 10 MG tablet Take 1 tablet (10 mg total) by mouth daily. 10/13/17   Enedina Finner, MD  montelukast (SINGULAIR) 10 MG tablet TAKE 1 TABLET(10 MG) BY MOUTH AT BEDTIME Patient taking differently: Take 10 mg by mouth at bedtime.  07/13/18   Reubin Milan, MD  omeprazole (PRILOSEC) 20 MG capsule TAKE 1 CAPSULE BY MOUTH DAILY Patient taking differently: Take 20 mg by mouth daily as needed (acid reflux).  06/04/18   Reubin Milan, MD  rOPINIRole (REQUIP) 2 MG tablet TAKE 1 TABLET BY MOUTH TWICE DAILY 10/10/18   Reubin Milan, MD  SPIRIVA HANDIHALER 18 MCG inhalation capsule INHALE CONTENTS OF 1 CAPSULE ONCE DAILY USING HANDIHALER 10/02/18   Reubin Milan, MD    Allergies Penicillins  Family History    Problem Relation Age of Onset   Myoclonus Mother    Breast cancer Mother    Prostate cancer Father    Heart disease Paternal Grandfather    Myoclonus Maternal Uncle     Social History Social History   Tobacco Use   Smoking status: Never Smoker   Smokeless tobacco: Current User    Types: Snuff  Substance Use Topics   Alcohol use: No    Alcohol/week: 0.0 standard drinks   Drug use: No    Review of Systems Constitutional: No fever/chills Eyes: No visual changes. ENT: No sore throat. Cardiovascular: Denies chest pain. Respiratory: Positive for productive cough and dyspnea Gastrointestinal: No abdominal pain.  No nausea, no vomiting.  No diarrhea.  No constipation. Genitourinary: Negative for dysuria. Musculoskeletal: Negative for neck pain.  Negative for back pain. Integumentary: Negative for rash. Neurological: Negative for headaches, focal weakness or numbness. }  ____________________________________________   PHYSICAL EXAM:  VITAL SIGNS: ED Triage Vitals  Enc Vitals Group     BP 10/29/18 0221 (!) 165/111     Pulse  Rate 10/29/18 0221 92     Resp 10/29/18 0221 (!) 22     Temp 10/29/18 0221 98.6 F (37 C)     Temp Source 10/29/18 0221 Oral     SpO2 10/29/18 0221 98 %     Weight 10/29/18 0219 70.8 kg (156 lb)     Height 10/29/18 0219 1.829 m (6')     Head Circumference --      Peak Flow --      Pain Score 10/29/18 0219 0     Pain Loc --      Pain Edu? --      Excl. in GC? --      Constitutional: Alert and oriented.  Apparent respiratory difficulty Eyes: Conjunctivae are normal.  Mouth/Throat: Mucous membranes are moist.  Oropharynx non-erythematous. Neck: No stridor.   Cardiovascular: Normal rate, regular rhythm. Good peripheral circulation. Grossly normal heart sounds. Respiratory: Normal respiratory effort.  No retractions.  Bibasilar rhonchi  Gastrointestinal: Soft and nontender. No distention.  Musculoskeletal: No lower extremity tenderness  nor edema. No gross deformities of extremities. Neurologic:  Normal speech and language. No gross focal neurologic deficits are appreciated.  Skin:  Skin is warm, dry and intact. No rash noted. Psychiatric: Mood and affect are normal. Speech and behavior are normal.  ____________________________________________   LABS (all labs ordered are listed, but only abnormal results are displayed)  Labs Reviewed  CBC - Abnormal; Notable for the following components:      Result Value   WBC 13.9 (*)    All other components within normal limits  COMPREHENSIVE METABOLIC PANEL - Abnormal; Notable for the following components:   Glucose, Bld 111 (*)    Calcium 10.4 (*)    All other components within normal limits  SARS CORONAVIRUS 2 (HOSPITAL ORDER, PERFORMED IN [facility])  TROPONIN I   ____________________________________________  EKG  ED ECG REPORT I, [place] [facility] Mitsugi Schrader, the attending physician, personally viewed and interpreted this ECG.   Date: 10/29/2018  EKG Time: 2:47 AM  Rate: 90  Rhythm: Normal sinus rhythm  Axis: Normal  Intervals: Normal  ST&T Change: None  ____________________________________________  RADIOLOGY I, [place] [facility] Zinia Innocent, personally viewed and evaluated these images (plain radiographs) as part of my medical decision making, as well as reviewing the written report by the radiologist.  ED MD interpretation: Chronic lung disease no acute cardiopulmonary abnormality noted.  Official radiology report(s): Dg Chest Portable 1 View  Result Date: 10/29/2018 CLINICAL DATA:  63 year old male with cough and shortness of breath. EXAM: PORTABLE CHEST 1 VIEW COMPARISON:  Chest radiographs 10/11/2017 and earlier. FINDINGS: Portable AP upright view at 0232 hours. Mediastinal contours remain normal. Stable large lung volumes. Visualized tracheal air column is within normal limits. Stable mild to moderate bilateral pulmonary interstitial opacity. No pneumothorax,  pulmonary edema, pleural effusion or acute pulmonary opacity. Internal versus external wires cross through the left chest, visible neck and abdomen. Negative visible bowel gas pattern. No acute osseous abnormality identified. IMPRESSION: Chronic lung disease.  No acute cardiopulmonary abnormality. Electronically Signed   By: Odessa Fleming M.D.   On: 10/29/2018 03:15    .Critical Care Performed by: Darci Current, MD Authorized by: Darci Current, MD   Critical care provider statement:    Critical care time (minutes):  30   Critical care time was exclusive of:  Separately billable procedures and treating other patients   Critical care was necessary to treat or prevent imminent or life-threatening deterioration of  the following conditions:  Respiratory failure   Critical care was time spent personally by me on the following activities:  Development of treatment plan with patient or surrogate, discussions with consultants, evaluation of patient's response to treatment, examination of patient, obtaining history from patient or surrogate, ordering and performing treatments and interventions, ordering and review of laboratory studies, ordering and review of radiographic studies, pulse oximetry, re-evaluation of patient's condition and review of old charts     ____________________________________________   INITIAL IMPRESSION / MDM / ASSESSMENT AND PLAN / ED COURSE  As part of my medical decision making, I reviewed the following data within the electronic MEDICAL RECORD NUMBER   63 year old male presenting with above-stated history and physical exam concerning for possible COPD exacerbation versus pneumonia versus acute on chronic bronchitis chest x-ray revealed no evidence of pneumonia.  Laboratory data I notable for white blood cell count of 13.9.  Patient given 2 DuoNeb's with improved respiratory status however some mild dyspnea remains and as such patient given 2 albuterol treatments with resolution  of dyspnea.  Patient also given IV Solu-Medrol 125 mg.  Patient states that he is feeling better at this time and is requesting discharge from the hospital.  Admission was recommended to the patient however he refused  *BRIEN WILKINSON was evaluated in Emergency Department on 10/29/2018 for the symptoms described in the history of present illness. He was evaluated in the context of the global COVID-19 pandemic, which necessitated consideration that the patient might be at risk for infection with the SARS-CoV-2 virus that causes COVID-19. Institutional protocols and algorithms that pertain to the evaluation of patients at risk for COVID-19 are in a state of rapid change based on information released by regulatory bodies including the CDC and federal and state organizations. These policies and algorithms were followed during the patient's care in the ED.*    ____________________________________________  FINAL CLINICAL IMPRESSION(S) / ED DIAGNOSES  Final diagnoses:  COPD exacerbation (HCC)     MEDICATIONS GIVEN DURING THIS VISIT:  Medications  ipratropium-albuterol (DUONEB) 0.5-2.5 (3) MG/3ML nebulizer solution 3 mL (3 mLs Nebulization Given 10/29/18 0255)  ipratropium-albuterol (DUONEB) 0.5-2.5 (3) MG/3ML nebulizer solution 3 mL (3 mLs Nebulization Given 10/29/18 0255)  methylPREDNISolone sodium succinate (SOLU-MEDROL) 125 mg/2 mL injection 125 mg (125 mg Intravenous Given 10/29/18 0235)  albuterol (PROVENTIL) (2.5 MG/3ML) 0.083% nebulizer solution 2.5 mg (2.5 mg Nebulization Given 10/29/18 0353)  albuterol (PROVENTIL) (2.5 MG/3ML) 0.083% nebulizer solution 2.5 mg (2.5 mg Nebulization Given 10/29/18 0354)     ED Discharge Orders    None       Note:  This document was prepared using Dragon voice recognition software and may include unintentional dictation errors.   Darci Current, MD 10/29/18 1281    Darci Current, MD 10/29/18 [phone]

## 2018-10-29 NOTE — ED Notes (Signed)
Breathing treatment complete. Patient states he is feeling much better and can breath better.

## 2018-10-29 NOTE — ED Notes (Signed)
Patient receiving breathing treatment at this time. No complaints.

## 2018-10-29 NOTE — ED Notes (Signed)
Patient sitting in bed. No complaints at this time. Patient states he feels much better.

## 2018-11-05 ENCOUNTER — Ambulatory Visit: Payer: Medicare Other | Admitting: Infectious Diseases

## 2018-11-09 ENCOUNTER — Other Ambulatory Visit: Payer: Self-pay | Admitting: Internal Medicine

## 2018-11-16 ENCOUNTER — Encounter: Payer: Self-pay | Admitting: Infectious Diseases

## 2018-11-16 ENCOUNTER — Ambulatory Visit (INDEPENDENT_AMBULATORY_CARE_PROVIDER_SITE_OTHER): Payer: Medicare Other | Admitting: Infectious Diseases

## 2018-11-16 ENCOUNTER — Other Ambulatory Visit: Payer: Self-pay

## 2018-11-16 DIAGNOSIS — K4021 Bilateral inguinal hernia, without obstruction or gangrene, recurrent: Secondary | ICD-10-CM

## 2018-11-16 DIAGNOSIS — T827XXD Infection and inflammatory reaction due to other cardiac and vascular devices, implants and grafts, subsequent encounter: Secondary | ICD-10-CM

## 2018-11-16 DIAGNOSIS — K4091 Unilateral inguinal hernia, without obstruction or gangrene, recurrent: Secondary | ICD-10-CM | POA: Insufficient documentation

## 2018-11-16 MED ORDER — CEPHALEXIN 500 MG PO CAPS: 500.0000 mg | ORAL_CAPSULE | Freq: Two times a day (BID) | ORAL | 3 refills | Status: DC

## 2018-11-16 NOTE — Assessment & Plan Note (Addendum)
He appears to be doing well Will resume his keflex to complete as prev scheduled 05-2019.  His new site is clean, nontender, no fluctuance.

## 2018-11-16 NOTE — Progress Notes (Signed)
   Subjective:    Patient ID: David Rhodes, male    DOB: 1956-04-17, 63 y.o.   MRN: [id number]  HPI 63 yo M with hx of infected deep brain stimulator, pulse generator (Left chest).  He was adm 10-8 and had removal of generator, leads were retained. Intraop cultures of sites revealed methicillin sensitive staph aureus at the L chest wound MSSA (pan sensitive) and MSSE at the cranial leads (R-cipro, erythro, tetra, bactrim only).  He was d/c home on 10-10 on ancef-rifampin for 2 weeks (end 10-22) then 4 weeks of keflex-rifampin.   He was seen by ID [id number] and was kept on keflex alone and was planned to remain on keflex for 1 year total (end date 05-2019) . He completed his anbx, he is not certain of the date. He has continued pain in his R groin, believes he has a hernia. He has not had u/s. He did have bilateral hernia repair 2015.  He had re-implantation of his stimulator 08-10-18.  Had COVID testing (-) 10-29-18 during COPD/asthma exacerbation.  He is asx but would like retesting. He has no dyspnea or cough in office today.   Review of Systems  Constitutional: Negative for appetite change, chills, fever and unexpected weight change.  Respiratory: Positive for cough and shortness of breath.   Gastrointestinal: Negative for constipation and diarrhea.  Genitourinary: Negative for difficulty urinating.  sputum production+ Cough and SOB are back to baseline.  Please see HPI. All other systems reviewed and negative.      Objective:   Physical Exam Constitutional:      Appearance: Normal appearance.  HENT:     Mouth/Throat:     Mouth: Mucous membranes are moist.     Pharynx: No oropharyngeal exudate.  Eyes:     Extraocular Movements: Extraocular movements intact.     Pupils: Pupils are equal, round, and reactive to light.  Cardiovascular:     Rate and Rhythm: Normal rate and regular rhythm.  Pulmonary:     Effort: Pulmonary effort is normal.     Breath sounds: Normal breath sounds.   Abdominal:     General: Bowel sounds are normal.     Palpations: Abdomen is soft.     Tenderness: There is no abdominal tenderness.     Hernia: No hernia is present.    Musculoskeletal:     Right lower leg: No edema.     Left lower leg: No edema.  Neurological:     Mental Status: He is alert.       Assessment & Plan:

## 2018-11-16 NOTE — Addendum Note (Signed)
Addended by: Andree Coss on: 11/16/2018 04:09 PM   Modules accepted: Orders

## 2018-11-16 NOTE — Assessment & Plan Note (Signed)
Will check u/s to eval his pain Call and let him know result.  Can f/u with PCP.

## 2018-11-19 ENCOUNTER — Ambulatory Visit: Payer: Medicare Other

## 2018-11-23 ENCOUNTER — Ambulatory Visit: Payer: Medicare Other

## 2018-11-23 ENCOUNTER — Other Ambulatory Visit: Payer: Self-pay

## 2018-11-23 ENCOUNTER — Other Ambulatory Visit: Payer: Self-pay | Admitting: Infectious Diseases

## 2018-11-23 ENCOUNTER — Ambulatory Visit
Admission: RE | Admit: 2018-11-23 | Discharge: 2018-11-23 | Disposition: A | Discharge status: Home / Self Care | Payer: Medicare Other | Source: Ambulatory Visit | Attending: Infectious Diseases | Admitting: Infectious Diseases

## 2018-11-23 DIAGNOSIS — K4021 Bilateral inguinal hernia, without obstruction or gangrene, recurrent: Secondary | ICD-10-CM | POA: Insufficient documentation

## 2018-12-03 ENCOUNTER — Other Ambulatory Visit: Payer: Self-pay | Admitting: Internal Medicine

## 2018-12-03 DIAGNOSIS — M26623 Arthralgia of bilateral temporomandibular joint: Secondary | ICD-10-CM

## 2018-12-03 DIAGNOSIS — I10 Essential (primary) hypertension: Secondary | ICD-10-CM

## 2018-12-05 ENCOUNTER — Other Ambulatory Visit: Payer: Self-pay | Admitting: Internal Medicine

## 2018-12-05 DIAGNOSIS — M26623 Arthralgia of bilateral temporomandibular joint: Secondary | ICD-10-CM

## 2018-12-06 ENCOUNTER — Telehealth: Payer: Self-pay | Admitting: Infectious Diseases

## 2018-12-06 NOTE — Telephone Encounter (Signed)
Called pt about his u/s.  His result was (-) for inguinal hernia.  He continues to complain of pain.  I asked him to f/u with his PCP. He has done this.  I also asked he consider following up with his hernia surgeon. Phone number given.

## 2018-12-24 ENCOUNTER — Telehealth: Payer: Self-pay

## 2018-12-24 NOTE — Telephone Encounter (Signed)
Pt called in stating that he did not think he could wait until his appt on 12/27/2018, due to increased sob.  Pt appeared very sob during our conversation and was unable to speak a complete sentence without stopping for a breath. Wheezing and crackling was also heard during our conversation. Based on these sx, I have recommended that pt go to ED for evaluation.   Denied fever, chills or sweats. Pt voiced his understanding and had no further questions.  Nothing further is needed.

## 2018-12-24 NOTE — Telephone Encounter (Signed)
Received call from pt requesting appt.  Pt last seen in our office in 2017. Pt reports of increased sob.  Pt has been scheduled for appt on 12/27/2018 at 8:30, as this was first available.  Advised pt to got to ED if sx worsen over the weekend.  Nothing further is needed at this time.

## 2018-12-25 ENCOUNTER — Inpatient Hospital Stay
Admission: EM | Admit: 2018-12-25 | Discharge: 2018-12-25 | DRG: 190 | Discharge status: Against Medical Advice | Payer: Medicare Other | Attending: Pulmonary Disease | Admitting: Pulmonary Disease

## 2018-12-25 ENCOUNTER — Emergency Department: Payer: Medicare Other

## 2018-12-25 DIAGNOSIS — Z1159 Encounter for screening for other viral diseases: Secondary | ICD-10-CM | POA: Diagnosis not present

## 2018-12-25 DIAGNOSIS — F329 Major depressive disorder, single episode, unspecified: Secondary | ICD-10-CM | POA: Diagnosis present

## 2018-12-25 DIAGNOSIS — F17290 Nicotine dependence, other tobacco product, uncomplicated: Secondary | ICD-10-CM | POA: Diagnosis present

## 2018-12-25 DIAGNOSIS — I10 Essential (primary) hypertension: Secondary | ICD-10-CM | POA: Diagnosis present

## 2018-12-25 DIAGNOSIS — G8921 Chronic pain due to trauma: Secondary | ICD-10-CM | POA: Diagnosis present

## 2018-12-25 DIAGNOSIS — J9621 Acute and chronic respiratory failure with hypoxia: Secondary | ICD-10-CM | POA: Diagnosis present

## 2018-12-25 DIAGNOSIS — Z880 Allergy status to penicillin: Secondary | ICD-10-CM | POA: Diagnosis not present

## 2018-12-25 DIAGNOSIS — R0603 Acute respiratory distress: Secondary | ICD-10-CM

## 2018-12-25 DIAGNOSIS — Z7951 Long term (current) use of inhaled steroids: Secondary | ICD-10-CM

## 2018-12-25 DIAGNOSIS — R0602 Shortness of breath: Secondary | ICD-10-CM | POA: Diagnosis present

## 2018-12-25 DIAGNOSIS — Z791 Long term (current) use of non-steroidal anti-inflammatories (NSAID): Secondary | ICD-10-CM | POA: Diagnosis not present

## 2018-12-25 DIAGNOSIS — K219 Gastro-esophageal reflux disease without esophagitis: Secondary | ICD-10-CM | POA: Diagnosis present

## 2018-12-25 DIAGNOSIS — Z8249 Family history of ischemic heart disease and other diseases of the circulatory system: Secondary | ICD-10-CM

## 2018-12-25 DIAGNOSIS — Z79899 Other long term (current) drug therapy: Secondary | ICD-10-CM

## 2018-12-25 DIAGNOSIS — J441 Chronic obstructive pulmonary disease with (acute) exacerbation: Principal | ICD-10-CM | POA: Diagnosis present

## 2018-12-25 LAB — COMPREHENSIVE METABOLIC PANEL
ALT: 14 U/L (ref 0–44)
AST: 17 U/L (ref 15–41)
Albumin: 4.3 g/dL (ref 3.5–5.0)
Alkaline Phosphatase: 85 U/L (ref 38–126)
Anion gap: 7 (ref 5–15)
BUN: 16 mg/dL (ref 8–23)
CO2: 28 mmol/L (ref 22–32)
Calcium: 10.8 mg/dL — ABNORMAL HIGH (ref 8.9–10.3)
Chloride: 105 mmol/L (ref 98–111)
Creatinine, Ser: 0.93 mg/dL (ref 0.61–1.24)
GFR calc Af Amer: >60 mL/min (ref >60)
GFR calc non Af Amer: >60 mL/min (ref >60)
Glucose, Bld: 111 mg/dL — ABNORMAL HIGH (ref 70–99)
Potassium: 4 mmol/L (ref 3.5–5.1)
Sodium: 140 mmol/L (ref 135–145)
Total Bilirubin: 0.6 mg/dL (ref 0.3–1.2)
Total Protein: 7.5 g/dL (ref 6.5–8.1)

## 2018-12-25 LAB — BLOOD GAS, VENOUS
Acid-Base Excess: 1.6 mmol/L (ref 0.0–2.0)
Bicarbonate: 28.9 mmol/L — ABNORMAL HIGH (ref 20.0–28.0)
Delivery systems: POSITIVE
FIO2: 0.4
O2 Saturation: 66.8 %
Patient temperature: 37
pCO2, Ven: 56 mmHg (ref 44.0–60.0)
pH, Ven: 7.32 (ref 7.250–7.430)
pO2, Ven: 38 mmHg (ref 32.0–45.0)

## 2018-12-25 LAB — CBC WITH DIFFERENTIAL/PLATELET
Abs Immature Granulocytes: 0.03 10*3/uL (ref 0.00–0.07)
Basophils Absolute: 0.2 10*3/uL — ABNORMAL HIGH (ref 0.0–0.1)
Basophils Relative: 2 %
Eosinophils Absolute: 2 10*3/uL — ABNORMAL HIGH (ref 0.0–0.5)
Eosinophils Relative: 17 %
HCT: 50.4 % (ref 39.0–52.0)
Hemoglobin: 16.7 g/dL (ref 13.0–17.0)
Immature Granulocytes: 0 %
Lymphocytes Relative: 28 %
Lymphs Abs: 3.4 10*3/uL (ref 0.7–4.0)
MCH: 29.6 pg (ref 26.0–34.0)
MCHC: 33.1 g/dL (ref 30.0–36.0)
MCV: 89.2 fL (ref 80.0–100.0)
Monocytes Absolute: 0.8 10*3/uL (ref 0.1–1.0)
Monocytes Relative: 7 %
Neutro Abs: 5.6 10*3/uL (ref 1.7–7.7)
Neutrophils Relative %: 46 %
Platelets: 243 10*3/uL (ref 150–400)
RBC: 5.65 MIL/uL (ref 4.22–5.81)
RDW: 12.8 % (ref 11.5–15.5)
WBC: 11.9 10*3/uL — ABNORMAL HIGH (ref 4.0–10.5)
nRBC: 0 % (ref 0.0–0.2)

## 2018-12-25 LAB — LACTIC ACID, PLASMA: Lactic Acid, Venous: 1.3 mmol/L (ref 0.5–1.9)

## 2018-12-25 LAB — SARS CORONAVIRUS 2 BY RT PCR (HOSPITAL ORDER, PERFORMED IN ~~LOC~~ HOSPITAL LAB)
SARS Coronavirus 2: NEGATIVE
SARS Coronavirus 2: NEGATIVE

## 2018-12-25 LAB — TROPONIN I (HIGH SENSITIVITY): Troponin I (High Sensitivity): 3 ng/L (ref <18)

## 2018-12-25 LAB — GLUCOSE, CAPILLARY: Glucose-Capillary: 130 mg/dL — ABNORMAL HIGH (ref 70–99)

## 2018-12-25 LAB — TSH: TSH: 1.522 u[IU]/mL (ref 0.350–4.500)

## 2018-12-25 LAB — MRSA PCR SCREENING: MRSA by PCR: NEGATIVE

## 2018-12-25 MED ORDER — METHYLPREDNISOLONE SODIUM SUCC 125 MG IJ SOLR: 125.0000 mg | Freq: Once | INTRAMUSCULAR | Status: DC

## 2018-12-25 MED ORDER — DOCUSATE SODIUM 100 MG PO CAPS: 100.0000 mg | ORAL_CAPSULE | Freq: Two times a day (BID) | ORAL | Status: DC

## 2018-12-25 MED ORDER — METHYLPREDNISOLONE SODIUM SUCC 125 MG IJ SOLR
INTRAMUSCULAR | Status: AC
  Filled 2018-12-25: qty 2

## 2018-12-25 MED ORDER — MAGNESIUM SULFATE 2 GM/50ML IV SOLN
2.0000 g | Freq: Once | INTRAVENOUS | Status: AC
  Administered 2018-12-25: 2 g via INTRAVENOUS

## 2018-12-25 MED ORDER — ENOXAPARIN SODIUM 40 MG/0.4ML ~~LOC~~ SOLN
40.0000 mg | SUBCUTANEOUS | Status: DC
  Filled 2018-12-25: qty 0.4

## 2018-12-25 MED ORDER — ACETAMINOPHEN 325 MG PO TABS: 650.0000 mg | ORAL_TABLET | Freq: Four times a day (QID) | ORAL | Status: DC | PRN

## 2018-12-25 MED ORDER — ONDANSETRON HCL 4 MG PO TABS: 4.0000 mg | ORAL_TABLET | Freq: Four times a day (QID) | ORAL | Status: DC | PRN

## 2018-12-25 MED ORDER — CHLORHEXIDINE GLUCONATE 0.12 % MT SOLN: 15.0000 mL | Freq: Two times a day (BID) | OROMUCOSAL | Status: DC

## 2018-12-25 MED ORDER — CHLORHEXIDINE GLUCONATE CLOTH 2 % EX PADS
6.0000 | MEDICATED_PAD | Freq: Every day | CUTANEOUS | Status: DC
  Filled 2018-12-25: qty 6

## 2018-12-25 MED ORDER — IPRATROPIUM-ALBUTEROL 0.5-2.5 (3) MG/3ML IN SOLN
3.0000 mL | Freq: Once | RESPIRATORY_TRACT | Status: AC
  Administered 2018-12-25: 03:00:00 3 mL via RESPIRATORY_TRACT

## 2018-12-25 MED ORDER — IPRATROPIUM-ALBUTEROL 0.5-2.5 (3) MG/3ML IN SOLN
RESPIRATORY_TRACT | Status: AC
  Administered 2018-12-25: 03:00:00 3 mL via RESPIRATORY_TRACT
  Filled 2018-12-25: qty 9

## 2018-12-25 MED ORDER — METHYLPREDNISOLONE SODIUM SUCC 125 MG IJ SOLR
125.0000 mg | Freq: Once | INTRAMUSCULAR | Status: AC
  Administered 2018-12-25: 03:00:00 125 mg via INTRAVENOUS

## 2018-12-25 MED ORDER — ONDANSETRON HCL 4 MG/2ML IJ SOLN: 4.0000 mg | Freq: Four times a day (QID) | INTRAMUSCULAR | Status: DC | PRN

## 2018-12-25 MED ORDER — ORAL CARE MOUTH RINSE: 15.0000 mL | Freq: Two times a day (BID) | OROMUCOSAL | Status: DC

## 2018-12-25 MED ORDER — ALBUTEROL SULFATE (2.5 MG/3ML) 0.083% IN NEBU
2.5000 mg | INHALATION_SOLUTION | RESPIRATORY_TRACT | Status: DC
  Administered 2018-12-25: 2.5 mg via RESPIRATORY_TRACT
  Filled 2018-12-25: qty 3

## 2018-12-25 MED ORDER — METHYLPREDNISOLONE SODIUM SUCC 125 MG IJ SOLR
125.0000 mg | Freq: Once | INTRAMUSCULAR | Status: AC
  Administered 2018-12-25: 125 mg via INTRAVENOUS

## 2018-12-25 MED ORDER — MAGNESIUM SULFATE 2 GM/50ML IV SOLN
INTRAVENOUS | Status: AC
  Filled 2018-12-25: qty 50

## 2018-12-25 MED ORDER — ACETAMINOPHEN 650 MG RE SUPP: 650.0000 mg | Freq: Four times a day (QID) | RECTAL | Status: DC | PRN

## 2018-12-25 NOTE — Progress Notes (Signed)
Pt called out asking when he would be discharged. I explained that Dr. Patsey Berthold felt that he needed to stay in the hospital another day, but that he would be going to a regular floor once they had a bed available. Pt expressed that he needed to leave because he has a funeral to go to at 11 AM this morning and he needed to leave and wanted to sign out AMA. Dr. Patsey Berthold notified and to bedside to speak with patient. Hospitalist paged to let him know as he is floor cre status at this point. During his conversation with Dr. Patsey Berthold pt becoming more agitated, seems to be getting more SOB, however maintaining O2 sats of 97-100%. Dr. Patsey Berthold expressing her concern that he needs to remain in hospital to continue to have his lungs worked on. Pt maintaining that he needs to leave and that he will be okay until he can follow up outpatient.

## 2018-12-25 NOTE — ED Notes (Signed)
Pt with improved resp status with bipap. Pt states he feels improved. Wheezing improved. Pt is able to speak in complete sentences over bipap mask at this time.

## 2018-12-25 NOTE — ED Notes (Signed)
Pt updated on delay for admission bed. Pt verbalizes understanding. Pt appears improved and states feels improved.

## 2018-12-25 NOTE — ED Provider Notes (Signed)
[facility] Emergency Department Provider Note  ____________________________________________  Time seen: Approximately 3:37 AM  I have reviewed the triage vital signs and the nursing notes.   HISTORY  Chief Complaint Respiratory Distress   HPI David Rhodes is a 63 y.o. male with a history of COPD, asthma, hypertension who presents for evaluation of difficulty breathing.  Patient endorses a dry cough and progressively worsening shortness of breath for the last week.  He has been using his inhalers which were helping initially however today his shortness of breath became severe and constant and unresponsive to inhalers.  He denies productive cough, fever or chills.  He is complaining of diffuse chest tightness.  No history of CHF or blood clots, no leg pain or swelling, no hemoptysis.  Past Medical History:  Diagnosis Date  . Allergy    Hay fever  . Anxiety   . Asthma   . Chronic pain due to trauma   . COPD (chronic obstructive pulmonary disease) (HCC)   . Depression   . Essential tremor   . GERD (gastroesophageal reflux disease)   . Headache(784.0)    neck pain  . Hypertension   . Inguinal hernia    bilateral  . Neuromuscular disorder (HCC)    hx of tremors  . Pneumonia    many years ago  . Shortness of breath    when allergies flare up  . Urine incontinence   . Wears glasses     Patient Active Problem List   Diagnosis Date Noted  . Inguinal hernia recurrent bilateral 11/16/2018  . Chronic left shoulder pain 10/05/2018  . Tremor 08/10/2018  . PICC (peripherally inserted central catheter) in place 04/16/2018  . 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 Essential tremor 10/18/2017  . Cervical radiculopathy 07/23/2017  . Bilateral temporomandibular joint pain 07/23/2017  . Neck pain 05/19/2017  . Medication monitoring encounter 09/11/2016  . Pulmonary nodules 12/31/2015  . COPD (chronic obstructive pulmonary disease) (HCC)  12/27/2015  . Chronic cough 12/27/2015  . Asthma 09/24/2015  . Allergic rhinitis 11/11/2014  . Nerve root pain 11/11/2014  . Dyskinesia 11/11/2014  . Acid reflux 11/11/2014  . Calcium blood increased 11/11/2014  . Affective disorder (HCC) 11/11/2014  . Essential hypertension 12/25/2011  . Chronic back pain 12/25/2011    Past Surgical History:  Procedure Laterality Date  . DEEP BRAIN STIMULATOR PLACEMENT    . INGUINAL HERNIA REPAIR Bilateral 02/07/2014   Procedure:  BILATERAL INGUINAL HERNIA REPAIR;  Surgeon: Axel FillerArmando Ramirez, MD;  Location: [facility] OR;  Service: General;  Laterality: Bilateral;  . INSERTION OF MESH Bilateral 02/07/2014   Procedure: INSERTION OF MESH;  Surgeon: Axel FillerArmando Ramirez, MD;  Location: [facility] OR;  Service: General;  Laterality: Bilateral;  . NASAL SINUS SURGERY    . PULSE GENERATOR IMPLANT Left 09/11/2016   Procedure: Left chest-Change implantable pulse generator battery;  Surgeon: Maeola HarmanJoseph Stern, MD;  [facility];  Service: Neurosurgery;  Laterality: Left;  Left chest-Change implantable pulse generator battery  . PULSE GENERATOR IMPLANT Left 04/06/2018   Procedure: Revision of left chest implantable pulse generator, extension, and pocket adapter;  Surgeon: Maeola HarmanStern, Joseph, MD;  [facility];  Service: Neurosurgery;  Laterality: Left;  . SUBTHALAMIC STIMULATOR BATTERY REPLACEMENT  12/09/2011   Procedure: SUBTHALAMIC STIMULATOR BATTERY REPLACEMENT;  Surgeon: Maeola HarmanJoseph Stern, MD;  Location: [facility] NEURO ORS;  Service: Neurosurgery;  Laterality: N/A;   deep brain stimulator, implantable pulse generator change  . SUBTHALAMIC STIMULATOR BATTERY REPLACEMENT Left  04/21/2014   Procedure: Deep brain stimulator battery change;  Surgeon: Maeola HarmanJoseph Stern, MD;  Location: [facility] NEURO ORS;  Service: Neurosurgery;  Laterality: Left;  Deep brain stimulator battery change  . SUBTHALAMIC STIMULATOR BATTERY REPLACEMENT N/A 08/10/2018   Procedure: Replacement of deep brain stimulator pulse generator/left  abdomen with long lead extensions;  Surgeon: Maeola HarmanStern, Joseph, MD;  [facility];  Service: Neurosurgery;  Laterality: N/A;    Prior to Admission medications   Medication Sig Start Date End Date Taking? Authorizing Provider  albuterol (PROVENTIL HFA;VENTOLIN HFA) 108 (90 Base) MCG/ACT inhaler INHALE 1 TO 2 PUFFS BY MOUTH EVERY 6 HOURS AS NEEDED FOR WHEEZING OR SHORTNESS OF BREATH Patient taking differently: Inhale 1-2 puffs into the lungs every 6 (six) hours as needed for wheezing or shortness of breath.  07/02/18   Reubin MilanBerglund, Laura H, MD  albuterol (PROVENTIL) (2.5 MG/3ML) 0.083% nebulizer solution Inhale 2.5 mg into the lungs every 6 (six) hours as needed for wheezing. 10/19/17 10/19/18  [provider]  baclofen (LIORESAL) 10 MG tablet TAKE 1 TABLET(10 MG) BY MOUTH THREE TIMES DAILY 12/05/18   Reubin MilanBerglund, Laura H, MD  BREO ELLIPTA 100-25 MCG/INH AEPB INHALE 1 PUFF INTO THE LUNGS DAILY 12/05/18   Reubin MilanBerglund, Laura H, MD  cephALEXin (KEFLEX) 500 MG capsule Take 1 capsule (500 mg total) by mouth 2 (two) times daily. 11/16/18   Ginnie SmartHatcher, Jeffrey C, MD  Fluticasone-Salmeterol (ADVAIR) 250-50 MCG/DOSE AEPB Inhale 1 puff into the lungs 2 (two) times daily. 10/19/17 11/16/18  [provider]  gabapentin (NEURONTIN) 600 MG tablet TAKE 1 TABLET(600 MG) BY MOUTH FOUR TIMES DAILY 11/09/18   Reubin MilanBerglund, Laura H, MD  HYDROcodone-acetaminophen (NORCO/VICODIN) 5-325 MG tablet Take 1-2 tablets by mouth every 4 (four) hours as needed for severe pain ((score 7 to 10)). Patient not taking: Reported on 11/16/2018 08/11/18   Maeola HarmanStern, Joseph, MD  lisinopril (ZESTRIL) 40 MG tablet Take 1 tablet (40 mg total) by mouth daily. 12/05/18   Reubin MilanBerglund, Laura H, MD  loratadine (CLARITIN) 10 MG tablet Take 1 tablet (10 mg total) by mouth daily. 10/13/17   Enedina FinnerPatel, Sona, MD  meloxicam (MOBIC) 15 MG tablet TAKE 1 TABLET(15 MG) BY MOUTH DAILY 12/06/18   Reubin MilanBerglund, Laura H, MD  montelukast (SINGULAIR) 10 MG tablet TAKE 1 TABLET(10 MG) BY MOUTH AT  BEDTIME 12/05/18   Reubin MilanBerglund, Laura H, MD  omeprazole (PRILOSEC) 20 MG capsule TAKE 1 CAPSULE BY MOUTH DAILY 12/05/18   Reubin MilanBerglund, Laura H, MD  rOPINIRole (REQUIP) 2 MG tablet TAKE 1 TABLET BY MOUTH TWICE DAILY 10/10/18   Reubin MilanBerglund, Laura H, MD  SPIRIVA HANDIHALER 18 MCG inhalation capsule INHALE CONTENTS OF 1 CAPSULE ONCE DAILY USING HANDIHALER 10/02/18   Reubin MilanBerglund, Laura H, MD    Allergies Penicillins  Family History  Problem Relation Age of Onset  . Myoclonus Mother   . Breast cancer Mother   . Prostate cancer Father   . Heart disease Paternal Grandfather   . Myoclonus Maternal Uncle     Social History Social History   Tobacco Use  . Smoking status: Never Smoker  . Smokeless tobacco: Current User    Types: Snuff  Substance Use Topics  . Alcohol use: No    Alcohol/week: 0.0 standard drinks  . Drug use: No    Review of Systems  Constitutional: Negative for fever. Eyes: Negative for visual changes. ENT: Negative for sore throat. Neck: No neck pain  Cardiovascular: Negative for chest pain. Respiratory: + shortness of breath and cough Gastrointestinal: Negative  for abdominal pain, vomiting or diarrhea. Genitourinary: Negative for dysuria. Musculoskeletal: Negative for back pain. Skin: Negative for rash. Neurological: Negative for headaches, weakness or numbness. Psych: No SI or HI  ____________________________________________   PHYSICAL EXAM:  VITAL SIGNS: ED Triage Vitals  Enc Vitals Group     BP 12/25/18 0258 (!) 190/139     Pulse Rate 12/25/18 0258 100     Resp 12/25/18 0258 (!) 33     Temp 12/25/18 0316 98.6 F (37 C)     Temp Source 12/25/18 0316 Axillary     SpO2 12/25/18 0258 94 %     Weight 12/25/18 0259 149 lb 14.6 oz (68 kg)     Height --      Head Circumference --      Peak Flow --      Pain Score 12/25/18 0258 5     Pain Loc --      Pain Edu? --      Excl. in GC? --     Constitutional: Alert and oriented, in moderate respiratory distress  HEENT:       Head: Normocephalic and atraumatic.         Eyes: Conjunctivae are normal. Sclera is non-icteric.       Mouth/Throat: Mucous membranes are moist.       Neck: Supple with no signs of meningismus. Cardiovascular: Tachycardic with regular rhythm. Respiratory: Tachypneic with significant increased work of breathing, using accessory muscles of respiration, tripoding, normal sats, diffuse wheezing bilaterally  gastrointestinal: Soft, non tender, and non distended with positive bowel sounds. No rebound or guarding. Musculoskeletal: Nontender with normal range of motion in all extremities. No edema, cyanosis, or erythema of extremities. Neurologic: Normal speech and language. Face is symmetric. Moving all extremities. No gross focal neurologic deficits are appreciated. Skin: Skin is warm, dry and intact. No rash noted. Psychiatric: Mood and affect are normal. Speech and behavior are normal.  ____________________________________________   LABS (all labs ordered are listed, but only abnormal results are displayed)  Labs Reviewed  BLOOD GAS, VENOUS - Abnormal; Notable for the following components:      Result Value   Bicarbonate 28.9 (*)    All other components within normal limits  CBC WITH DIFFERENTIAL/PLATELET - Abnormal; Notable for the following components:   WBC 11.9 (*)    Eosinophils Absolute 2.0 (*)    Basophils Absolute 0.2 (*)    All other components within normal limits  COMPREHENSIVE METABOLIC PANEL - Abnormal; Notable for the following components:   Glucose, Bld 111 (*)    Calcium 10.8 (*)    All other components within normal limits  SARS CORONAVIRUS 2 (HOSPITAL ORDER, PERFORMED IN [place] [facility])  SARS CORONAVIRUS 2 (HOSPITAL ORDER, PERFORMED IN [place] [facility])  LACTIC ACID, PLASMA  TROPONIN I (HIGH SENSITIVITY)   ____________________________________________  EKG  Unable to obtain  ____________________________________________  RADIOLOGY  I  have personally reviewed the images performed during this visit and I agree with the Radiologist's read.   Interpretation by Radiologist:  Dg Chest Portable 1 View  Result Date: 12/25/2018 CLINICAL DATA:  Shortness of breath. EXAM: PORTABLE CHEST 1 VIEW COMPARISON:  Oct 29, 2018 FINDINGS: The heart size is normal. There are prominent interstitial lung markings bilaterally which is stable from prior studies. There is no pneumothorax. The lungs appear hyperexpanded. There is no focal area of consolidation. No large pleural effusion. IMPRESSION: Hyperexpanded lungs with stable prominent interstitial lung markings bilaterally. Electronically Signed  By: Constance Holster M.D.   On: 12/25/2018 03:38     ____________________________________________   PROCEDURES  Procedure(s) performed: None Procedures Critical Care performed: yes  CRITICAL CARE Performed by: Rudene Re  ?  Total critical care time: 35 min  Critical care time was exclusive of separately billable procedures and treating other patients.  Critical care was necessary to treat or prevent imminent or life-threatening deterioration.  Critical care was time spent personally by me on the following activities: development of treatment plan with patient and/or surrogate as well as nursing, discussions with consultants, evaluation of patient's response to treatment, examination of patient, obtaining history from patient or surrogate, ordering and performing treatments and interventions, ordering and review of laboratory studies, ordering and review of radiographic studies, pulse oximetry and re-evaluation of patient's condition.  ____________________________________________   INITIAL IMPRESSION / ASSESSMENT AND PLAN / ED COURSE  63 y.o. male with a history of COPD, asthma, hypertension who presents for evaluation of difficulty breathing.  Patient arrives in moderate respiratory distress with significant increased work of  breathing, tachypneic, tripoding, speaking 3 word sentences, diffuse expiratory wheezes throughout.  Interestingly patient is not hypoxic.  He was immediately placed on BiPAP and started on duo nebs and Solu-Medrol.  His chest x-ray showed hyperexpanded lungs with no obvious infiltrate.  He has had no fever at home or here but COVID swab has been sent. IV magnesium also given since patient also has asthma. Initially elevated BP but that resolved once on BiPAP.  With no fever, no infiltrate on chest x-ray, normal lactic with hold off on antibiotics at this time.  VBG showing pH of 7.32 with a PCO2 of 52.  CMP with no significant changes from baseline.  Troponin negative.  EKG unable to be obtained due to recent deep brain nerve stimulator placed in February which causes severe interference with EKG machine. Anticipate admission after COVID is back.   _________________________ 4:29 AM on 12/25/2018 -----------------------------------------  COVID negative, will admit to Hospitalist     As part of my medical decision making, I reviewed the following data within the [place] notes reviewed and incorporated, Labs reviewed , EKG interpreted , Old EKG reviewed, Old chart reviewed, Radiograph reviewed , Discussed with admitting physician , Notes from prior ED visits and [place] Controlled Substance Database    Pertinent labs & imaging results that were available during my care of the patient were reviewed by me and considered in my medical decision making (see chart for details).    ____________________________________________   FINAL CLINICAL IMPRESSION(S) / ED DIAGNOSES  Final diagnoses:  Respiratory distress  COPD exacerbation ([place])      NEW MEDICATIONS STARTED DURING THIS VISIT:  ED Discharge Orders    None       Note:  This document was prepared using Dragon voice recognition software and may include unintentional dictation errors.    Alfred Levins, [place],  MD 12/25/18 [phone]

## 2018-12-25 NOTE — ED Notes (Signed)
ED TO INPATIENT HANDOFF REPORT  ED Nurse Name and Phone #: Elena Cothern [phone]   S Name/Age/Gender David Rhodes 63 y.o. male Room/Bed: ED06A/ED06A  Code Status   Code Status: Prior  Home/SNF/Other Home Patient oriented to: self, place, time and situation Is this baseline? Yes   Triage Complete: Triage complete  Chief Complaint Shortness of Breath  Triage Note Patient to lobby, wheezing, 2-3 word sentences. Patient intentionally dropped to knees his while this RN getting wheel chair.    Allergies Allergies  Allergen Reactions  . Penicillins Shortness Of Breath    PATIENT HAS HAD A PCN REACTION WITH IMMEDIATE RASH, FACIAL/TONGUE/THROAT SWELLING, SOB, OR LIGHTHEADEDNESS WITH HYPOTENSION:  #  #  YES  #  #  Has patient had a PCN reaction causing severe rash involving mucus membranes or skin necrosis: No Has patient had a PCN reaction that required hospitalization No Has patient had a PCN reaction occurring within the last 10 years: No If all of the above answers are "NO", then may proceed with Cephalosporin use. **tolerated cefazolin and cephalexin 03/2017    Level of Care/Admitting Diagnosis ED Disposition    ED Disposition Condition Comment   Admit  [facility] [[id number]]  Level of Care: Stepdown [14]  Covid Evaluation: Confirmed COVID Negative  Diagnosis: Acute on chronic respiratory failure with hypoxemia [facility](HCC) [[id number]]) [[id number]]  Admitting Physician: Arnaldo NatalIAMOND, MICHAEL S [[id number]]  Attending Physician: Arnaldo NatalDIAMOND, MICHAEL S [[id number]]  Estimated length of stay: past midnight tomorrow  Certification:: I certify this patient will need inpatient services for at least 2 midnights  PT Class (Do Not Modify): Inpatient [101]  PT Acc Code (Do Not Modify): Private [1]       B Medical/Surgery History Past Medical History:  Diagnosis Date  . Allergy    Hay fever  . Anxiety   . Asthma   . Chronic pain due to trauma   . COPD (chronic obstructive  pulmonary disease) (HCC)   . Depression   . Essential tremor   . GERD (gastroesophageal reflux disease)   . Headache(784.0)    neck pain  . Hypertension   . Inguinal hernia    bilateral  . Neuromuscular disorder (HCC)    hx of tremors  . Pneumonia    many years ago  . Shortness of breath    when allergies flare up  . Urine incontinence   . Wears glasses    Past Surgical History:  Procedure Laterality Date  . DEEP BRAIN STIMULATOR PLACEMENT    . INGUINAL HERNIA REPAIR Bilateral 02/07/2014   Procedure:  BILATERAL INGUINAL HERNIA REPAIR;  Surgeon: Axel FillerArmando Ramirez, MD;  Location: [facility] OR;  Service: General;  Laterality: Bilateral;  . INSERTION OF MESH Bilateral 02/07/2014   Procedure: INSERTION OF MESH;  Surgeon: Axel FillerArmando Ramirez, MD;  Location: [facility] OR;  Service: General;  Laterality: Bilateral;  . NASAL SINUS SURGERY    . PULSE GENERATOR IMPLANT Left 09/11/2016   Procedure: Left chest-Change implantable pulse generator battery;  Surgeon: Maeola HarmanJoseph Stern, MD;  [facility];  Service: Neurosurgery;  Laterality: Left;  Left chest-Change implantable pulse generator battery  . PULSE GENERATOR IMPLANT Left 04/06/2018   Procedure: Revision of left chest implantable pulse generator, extension, and pocket adapter;  Surgeon: Maeola HarmanStern, Joseph, MD;  [facility];  Service: Neurosurgery;  Laterality: Left;  . SUBTHALAMIC STIMULATOR BATTERY REPLACEMENT  12/09/2011   Procedure: SUBTHALAMIC STIMULATOR BATTERY REPLACEMENT;  Surgeon: Maeola HarmanJoseph Stern, MD;  Location: [facility] NEUR[facility] ORS;  Service: Neurosurgery;  Laterality: N/A;   deep brain stimulator, implantable pulse generator change  . SUBTHALAMIC STIMULATOR BATTERY REPLACEMENT Left 04/21/2014   Procedure: Deep brain stimulator battery change;  Surgeon: Maeola HarmanJoseph Stern, MD;  Location: [facility]UR[facility] OR[facility];  Service: Neurosurgery;  Laterality: Left;  Deep brain stimulator battery change  . SUBTHALAMIC STIMULATOR BATTERY REPLACEMENT N/A 08/10/2018   Procedure: Replacement of deep  brain stimulator pulse generator/left abdomen with long lead extensions;  Surgeon: Maeola HarmanStern, Joseph, MD;  [facility];  Service: Neurosurgery;  Laterality: N/A;     A IV Location/Drains/Wounds Patient Lines/Drains/Airways Status   Active Line/Drains/Airways    Name:   Placement date:   Placement time:   Site:   Days:   Peripheral IV 12/25/18 Right Antecubital   12/25/18    0300    Antecubital   less than 1   Peripheral IV 12/25/18 Left Forearm   12/25/18    0306    Forearm   less than 1   PICC Single Lumen 04/07/18 PICC Right Basilic 39 cm 0 cm   04/07/18    1950    Basilic   262   Incision 12/09/11 Shoulder Left   12/09/11    1129     2573   Incision 12/09/11 Chest Left   12/09/11    1132     2573   Incision (Closed) 02/07/14 Groin Bilateral   02/07/14    1337     1782   Incision (Closed) 04/21/14 Chest   04/21/14    1659     1709   Incision (Closed) 09/11/16 Chest   09/11/16    1655     835   Incision (Closed) 08/10/18 Other (Comment) Left   08/10/18    1506     137   Incision (Closed) 08/10/18 Chest Left   08/10/18    1506     137   Incision (Closed) 08/10/18 Abdomen Left   08/10/18    1506     137          Intake/Output Last 24 hours  Intake/Output Summary (Last 24 hours) at 12/25/2018 16100511 Last data filed at 12/25/2018 0445 Gross per 24 hour  Intake 50 ml  Output -  Net 50 ml    Labs/Imaging Results for orders placed or performed during the hospital encounter of 12/25/18 (from the past 48 hour(s))  Lactic acid, plasma     Status: None   Collection Time: 12/25/18  3:02 AM  Result Value Ref Range   Lactic Acid, Venous 1.3 0.5 - 1.9 mmol/L    Comment: Performed at [facility], [address] [place], [place] [address]  CBC with Differential/Platelet     Status: Abnormal   Collection Time: 12/25/18  3:02 AM  Result Value Ref Range   WBC 11.9 (H) 4.0 - 10.5 K/uL   RBC 5.65 4.22 - 5.81 MIL/uL   Hemoglobin 16.7 13.0 - 17.0 g/dL   HCT 40.950.4 81.139.0 - 91.452.0 %   MCV  89.2 80.0 - 100.0 fL   MCH 29.6 26.0 - 34.0 pg   MCHC 33.1 30.0 - 36.0 g/dL   RDW 78.212.8 95.611.5 - 21.315.5 %   Platelets 243 150 - 400 K/uL   nRBC 0.0 0.0 - 0.2 %   Neutrophils Relative % 46 %   Neutro Abs 5.6 1.7 - 7.7 K/uL   Lymphocytes Relative 28 %   Lymphs Abs 3.4 0.7 - 4.0 K/uL   Monocytes Relative 7 %   Monocytes  Absolute 0.8 0.1 - 1.0 K/uL   Eosinophils Relative 17 %   Eosinophils Absolute 2.0 (H) 0.0 - 0.5 K/uL   Basophils Relative 2 %   Basophils Absolute 0.2 (H) 0.0 - 0.1 K/uL   Immature Granulocytes 0 %   Abs Immature Granulocytes 0.03 0.00 - 0.07 K/uL    Comment: Performed at [facility], [address] [place], [place] [address]  Comprehensive metabolic panel     Status: Abnormal   Collection Time: 12/25/18  3:02 AM  Result Value Ref Range   Sodium 140 135 - 145 mmol/L   Potassium 4.0 3.5 - 5.1 mmol/L   Chloride 105 98 - 111 mmol/L   CO2 28 22 - 32 mmol/L   Glucose, Bld 111 (H) 70 - 99 mg/dL   BUN 16 8 - 23 mg/dL   Creatinine, Ser 2.95 0.61 - 1.24 mg/dL   Calcium 62.1 (H) 8.9 - 10.3 mg/dL   Total Protein 7.5 6.5 - 8.1 g/dL   Albumin 4.3 3.5 - 5.0 g/dL   AST 17 15 - 41 U/L   ALT 14 0 - 44 U/L   Alkaline Phosphatase 85 38 - 126 U/L   Total Bilirubin 0.6 0.3 - 1.2 mg/dL   GFR calc non Af Amer >60 >60 mL/min   GFR calc Af Amer >60 >60 mL/min   Anion gap 7 5 - 15    Comment: Performed at [facility], [address] [place], [place] [address]  Troponin I (High Sensitivity)     Status: None   Collection Time: 12/25/18  3:02 AM  Result Value Ref Range   Troponin I (High Sensitivity) 3 <18 ng/L    Comment: (NOTE) Elevated high sensitivity troponin I (hsTnI) values and significant  changes across serial measurements may suggest ACS but many other  chronic and acute conditions are known to elevate hsTnI results.  Refer to the "Links" section for chest pain algorithms and additional  guidance. Performed at [facility], [address], [place], [place] [address]   SARS Coronavirus 2 (CEPHEID- Performed in [facility] hospital lab), Hosp Order     Status: None   Collection Time: 12/25/18  3:02 AM   Specimen: Nasopharyngeal Swab  Result Value Ref Range   SARS Coronavirus 2 NEGATIVE NEGATIVE    Comment: (NOTE) If result is NEGATIVE SARS-CoV-2 target nucleic acids are NOT DETECTED. The SARS-CoV-2 RNA is generally detectable in upper and lower  respiratory specimens during the acute phase of infection. The lowest  concentration of SARS-CoV-2 viral copies this assay can detect is 250  copies / mL. A negative result does not preclude SARS-CoV-2 infection  and should not be used as the sole basis for treatment or other  patient management decisions.  A negative result may occur with  improper specimen collection / handling, submission of specimen other  than nasopharyngeal swab, presence of viral mutation(s) within the  areas targeted by this assay, and inadequate number of viral copies  (<250 copies / mL). A negative result must be combined with clinical  observations, patient history, and epidemiological information. If result is POSITIVE SARS-CoV-2 target nucleic acids are DETECTED. The SARS-CoV-2 RNA is generally detectable in upper and lower  respiratory specimens dur ing the acute phase of infection.  Positive  results are indicative of active infection with SARS-CoV-2.  Clinical  correlation with patient history and other diagnostic information is  necessary to determine patient infection status.  Positive results do  not  rule out bacterial infection or co-infection with other viruses. If result is PRESUMPTIVE POSTIVE SARS-CoV-2 nucleic acids MAY BE PRESENT.   A presumptive positive result was obtained on the submitted specimen  and confirmed on repeat testing.  While 2019 novel coronavirus  (SARS-CoV-2) nucleic acids may be present in the submitted sample  additional confirmatory testing may be necessary for  epidemiological  and / or clinical management purposes  to differentiate between  SARS-CoV-2 and other Sarbecovirus currently known to infect humans.  If clinically indicated additional testing with an alternate test  methodology [phone]) is advised. The SARS-CoV-2 RNA is generally  detectable in upper and lower respiratory sp ecimens during the acute  phase of infection. The expected result is Negative. Fact Sheet for Patients:  BoilerBrush.com.cy Fact Sheet for Healthcare Providers: https://pope.com/ This test is not yet approved or cleared by the [place] FDA and has been authorized for detection and/or diagnosis of SARS-CoV-2 by FDA under an Emergency Use Authorization (EUA).  This EUA will remain in effect (meaning this test can be used) for the duration of the COVID-19 declaration under Section 564(b)(1) of the Act, 21 U.S.C. section 360bbb-3(b)(1), unless the authorization is terminated or revoked sooner. Performed at [facility], [address] [place], [place] [address]   Blood gas, venous     Status: Abnormal   Collection Time: 12/25/18  3:09 AM  Result Value Ref Range   FIO2 0.40    Delivery systems BILEVEL POSITIVE AIRWAY PRESSURE    pH, Ven 7.32 7.250 - 7.430   pCO2, Ven 56 44.0 - 60.0 mmHg   pO2, Ven 38.0 32.0 - 45.0 mmHg   Bicarbonate 28.9 (H) 20.0 - 28.0 mmol/L   Acid-Base Excess 1.6 0.0 - 2.0 mmol/L   O2 Saturation 66.8 %   Patient temperature 37.0    Collection site VENOUS    Sample type VENOUS     Comment: Performed at [facility], [address] [place], [place] [address]  SARS Coronavirus 2 [facility] order, Performed in [facility] hospital lab)     Status: None   Collection Time: 12/25/18  3:40 AM  Result Value Ref Range   SARS Coronavirus 2 NEGATIVE NEGATIVE    Comment: (NOTE) If result is NEGATIVE SARS-CoV-2 target nucleic acids are NOT DETECTED. The SARS-CoV-2 RNA is  generally detectable in upper and lower  respiratory specimens during the acute phase of infection. The lowest  concentration of SARS-CoV-2 viral copies this assay can detect is 250  copies / mL. A negative result does not preclude SARS-CoV-2 infection  and should not be used as the sole basis for treatment or other  patient management decisions.  A negative result may occur with  improper specimen collection / handling, submission of specimen other  than nasopharyngeal swab, presence of viral mutation(s) within the  areas targeted by this assay, and inadequate number of viral copies  (<250 copies / mL). A negative result must be combined with clinical  observations, patient history, and epidemiological information. If result is POSITIVE SARS-CoV-2 target nucleic acids are DETECTED. The SARS-CoV-2 RNA is generally detectable in upper and lower  respiratory specimens dur ing the acute phase of infection.  Positive  results are indicative of active infection with SARS-CoV-2.  Clinical  correlation with patient history and other diagnostic information is  necessary to determine patient infection status.  Positive results do  not rule out bacterial infection or co-infection with other viruses. If result is PRESUMPTIVE POSTIVE SARS-CoV-2 nucleic  acids MAY BE PRESENT.   A presumptive positive result was obtained on the submitted specimen  and confirmed on repeat testing.  While 2019 novel coronavirus  (SARS-CoV-2) nucleic acids may be present in the submitted sample  additional confirmatory testing may be necessary for epidemiological  and / or clinical management purposes  to differentiate between  SARS-CoV-2 and other Sarbecovirus currently known to infect humans.  If clinically indicated additional testing with an alternate test  methodology [phone]) is advised. The SARS-CoV-2 RNA is generally  detectable in upper and lower respiratory sp ecimens during the acute  phase of  infection. The expected result is Negative. Fact Sheet for Patients:  BoilerBrush.com.cyhttps://www.fda.gov/media/136312/download Fact Sheet for Healthcare Providers: https://pope.com/https://www.fda.gov/media/136313/download This test is not yet approved or cleared by the [place] FDA and has been authorized for detection and/or diagnosis of SARS-CoV-2 by FDA under an Emergency Use Authorization (EUA).  This EUA will remain in effect (meaning this test can be used) for the duration of the COVID-19 declaration under Section 564(b)(1) of the Act, 21 U.S.C. section 360bbb-3(b)(1), unless the authorization is terminated or revoked sooner. Performed at [facility], [address] [place], [place] [address]    Dg Chest Portable 1 View  Result Date: 12/25/2018 CLINICAL DATA:  Shortness of breath. EXAM: PORTABLE CHEST 1 VIEW COMPARISON:  Oct 29, 2018 FINDINGS: The heart size is normal. There are prominent interstitial lung markings bilaterally which is stable from prior studies. There is no pneumothorax. The lungs appear hyperexpanded. There is no focal area of consolidation. No large pleural effusion. IMPRESSION: Hyperexpanded lungs with stable prominent interstitial lung markings bilaterally. Electronically Signed   By: Katherine Mantlehristopher  Green M.D.   On: 12/25/2018 03:38    Pending Labs [employer] Labs (From admission, onward)    Start     Ordered   Signed and Held  Creatinine, serum  (enoxaparin (LOVENOX)    CrCl >/= 30 ml/min)  Weekly,   R    Comments: while on enoxaparin therapy    Signed and Held   Signed and Held  TSH  Add-on,   R     Signed and Held   Signed and Held  Hemoglobin A1c  Add-on,   R     Signed and Held          Vitals/Pain Today's Vitals   12/25/18 0400 12/25/18 0430 12/25/18 0445 12/25/18 0500  BP: (!) 146/103 (!) 139/98 (!) 124/96 (!) 123/96  Pulse: 81 84 73 76  Resp: (!) 21 20 18 20   Temp:      TempSrc:      SpO2: 97% 98% 99% 100%  Weight:      PainSc:        Isolation  Precautions Airborne and Contact precautions  Medications Medications  magnesium sulfate 2 GM/50ML IVPB (has no administration in time range)  ipratropium-albuterol (DUONEB) 0.5-2.5 (3) MG/3ML nebulizer solution 3 mL (3 mLs Nebulization Given by Other 12/25/18 0314)  ipratropium-albuterol (DUONEB) 0.5-2.5 (3) MG/3ML nebulizer solution 3 mL (3 mLs Nebulization Given by Other 12/25/18 0314)  ipratropium-albuterol (DUONEB) 0.5-2.5 (3) MG/3ML nebulizer solution 3 mL (3 mLs Nebulization Given by Other 12/25/18 0314)  methylPREDNISolone sodium succinate (SOLU-MEDROL) 125 mg/2 mL injection 125 mg (125 mg Intravenous Given 12/25/18 0305)  methylPREDNISolone sodium succinate (SOLU-MEDROL) 125 mg/2 mL injection 125 mg (125 mg Intravenous Given by Other 12/25/18 0310)  magnesium sulfate IVPB 2 g 50 mL (0 g Intravenous Stopped 12/25/18 0445)    Mobility walks Low fall risk  Focused Assessments Cardiac Assessment Handoff:  Cardiac Rhythm: Normal sinus rhythm Lab Results  Component Value Date   CKTOTAL 51 06/26/2012   CKMB < 0.5 (L) 06/26/2012   TROPONINI <0.03 10/29/2018   No results found for: DDIMER Does the Patient currently have chest pain? No  , Pulmonary Assessment Handoff:  Lung sounds: Bilateral Breath Sounds: Inspiratory wheezes, Expiratory wheezes L Breath Sounds: Expiratory wheezes, Inspiratory wheezes R Breath Sounds: Expiratory wheezes, Inspiratory wheezes O2 Device: Bi-PAP        R Recommendations: See Admitting Provider Note  Report given to:   Additional Notes:

## 2018-12-25 NOTE — ED Triage Notes (Addendum)
Patient to lobby, wheezing, 2-3 word sentences. Patient intentionally dropped to knees his while this RN getting wheel chair.

## 2018-12-25 NOTE — Progress Notes (Addendum)
A formal consult to PCCM was not placed however I was reviewing cases admitted to ICU/stepdown overnight.  I have recommended against the patient leaving AMA until he is further evaluated.  He is insistent that he wants to leave and that he feels "100% better than when I came in".  Patient is currently MedSurg floor status.  Saturations are being maintained at 98 to 100% on room air.  Patient insists on leaving.  Defer to primary team.   C. Derrill Kay, MD [place]

## 2018-12-25 NOTE — H&P (Signed)
David Rhodes is an 63 y.o. male.   Chief Complaint: Shortness of breath HPI: The patient with past medical history of COPD and hypertension presents to the emergency department complaining of shortness of breath.  The patient reports having cough and shortness of breath x1 week that has progressively worsened.  He states that his inhalers are no longer working and that he is exhausted from breathing.  The patient was placed on BiPAP and work of breathing quickly improved.  He denies travel risk factors or known contact with individuals with exposure to novel coronavirus.  Once he was stabilized emergency department staff call hospitalist service for admission.   Past Medical History:  Diagnosis Date  . Allergy    Hay fever  . Anxiety   . Asthma   . Chronic pain due to trauma   . COPD (chronic obstructive pulmonary disease) (HCC)   . Depression   . Essential tremor   . GERD (gastroesophageal reflux disease)   . Headache(784.0)    neck pain  . Hypertension   . Inguinal hernia    bilateral  . Neuromuscular disorder (HCC)    hx of tremors  . Pneumonia    many years ago  . Shortness of breath    when allergies flare up  . Urine incontinence   . Wears glasses     Past Surgical History:  Procedure Laterality Date  . DEEP BRAIN STIMULATOR PLACEMENT    . INGUINAL HERNIA REPAIR Bilateral 02/07/2014   Procedure:  BILATERAL INGUINAL HERNIA REPAIR;  Surgeon: Axel Filler, MD;  Location: [facility] OR;  Service: General;  Laterality: Bilateral;  . INSERTION OF MESH Bilateral 02/07/2014   Procedure: INSERTION OF MESH;  Surgeon: Axel Filler, MD;  Location: [facility] OR;  Service: General;  Laterality: Bilateral;  . NASAL SINUS SURGERY    . PULSE GENERATOR IMPLANT Left 09/11/2016   Procedure: Left chest-Change implantable pulse generator battery;  Surgeon: Maeola Harman, MD;  Location: [facility] OR;  Service: Neurosurgery;  Laterality: Left;  Left chest-Change implantable pulse generator battery  . PULSE  GENERATOR IMPLANT Left 04/06/2018   Procedure: Revision of left chest implantable pulse generator, extension, and pocket adapter;  Surgeon: Maeola Harman, MD;  Location: [facility] OR;  Service: Neurosurgery;  Laterality: Left;  . SUBTHALAMIC STIMULATOR BATTERY REPLACEMENT  12/09/2011   Procedure: SUBTHALAMIC STIMULATOR BATTERY REPLACEMENT;  Surgeon: Maeola Harman, MD;  Location: [facility] NEURO ORS;  Service: Neurosurgery;  Laterality: N/A;   deep brain stimulator, implantable pulse generator change  . SUBTHALAMIC STIMULATOR BATTERY REPLACEMENT Left 04/21/2014   Procedure: Deep brain stimulator battery change;  Surgeon: Maeola Harman, MD;  Location: [facility]UR[facility] ORS;  Service: Neurosurgery;  Laterality: Left;  Deep brain stimulator battery change  . SUBTHALAMIC STIMULATOR BATTERY REPLACEMENT N/A 08/10/2018   Procedure: Replacement of deep brain stimulator pulse generator/left abdomen with long lead extensions;  Surgeon: Maeola Harman, MD;  Location: [facility] OR;  Service: Neurosurgery;  Laterality: N/A;    Family History  Problem Relation Age of Onset  . Myoclonus Mother   . Breast cancer Mother   . Prostate cancer Father   . Heart disease Paternal Grandfather   . Myoclonus Maternal Uncle    Social History:  reports that he has never smoked. His smokeless tobacco use includes snuff. He reports that he does not drink alcohol or use drugs.  Allergies:  Allergies  Allergen Reactions  . Penicillins Shortness Of Breath    PATIENT HAS HAD A PCN REACTION WITH IMMEDIATE RASH, FACIAL/TONGUE/THROAT SWELLING,  SOB, OR LIGHTHEADEDNESS WITH HYPOTENSION:  #  #  YES  #  #  Has patient had a PCN reaction causing severe rash involving mucus membranes or skin necrosis: No Has patient had a PCN reaction that required hospitalization No Has patient had a PCN reaction occurring within the last 10 years: No If all of the above answers are "NO", then may proceed with Cephalosporin use. **tolerated cefazolin and cephalexin 03/2017     Medications Prior to Admission  Medication Sig Dispense Refill  . albuterol (PROVENTIL HFA;VENTOLIN HFA) 108 (90 Base) MCG/ACT inhaler INHALE 1 TO 2 PUFFS BY MOUTH EVERY 6 HOURS AS NEEDED FOR WHEEZING OR SHORTNESS OF BREATH (Patient taking differently: Inhale 1-2 puffs into the lungs every 6 (six) hours as needed for wheezing or shortness of breath. ) 8.5 g 5  . albuterol (PROVENTIL) (2.5 MG/3ML) 0.083% nebulizer solution Inhale 2.5 mg into the lungs every 6 (six) hours as needed for wheezing.    . baclofen (LIORESAL) 10 MG tablet TAKE 1 TABLET(10 MG) BY MOUTH THREE TIMES DAILY 90 tablet 1  . BREO ELLIPTA 100-25 MCG/INH AEPB INHALE 1 PUFF INTO THE LUNGS DAILY 60 each 12  . cephALEXin (KEFLEX) 500 MG capsule Take 1 capsule (500 mg total) by mouth 2 (two) times daily. 120 capsule 3  . Fluticasone-Salmeterol (ADVAIR) 250-50 MCG/DOSE AEPB Inhale 1 puff into the lungs 2 (two) times daily.    Marland Kitchen. gabapentin (NEURONTIN) 600 MG tablet TAKE 1 TABLET(600 MG) BY MOUTH FOUR TIMES DAILY 120 tablet 5  . HYDROcodone-acetaminophen (NORCO/VICODIN) 5-325 MG tablet Take 1-2 tablets by mouth every 4 (four) hours as needed for severe pain ((score 7 to 10)). (Patient not taking: Reported on 11/16/2018) 30 tablet 0  . lisinopril (ZESTRIL) 40 MG tablet Take 1 tablet (40 mg total) by mouth daily. 90 tablet 1  . loratadine (CLARITIN) 10 MG tablet Take 1 tablet (10 mg total) by mouth daily. 30 tablet 0  . meloxicam (MOBIC) 15 MG tablet TAKE 1 TABLET(15 MG) BY MOUTH DAILY 90 tablet 1  . montelukast (SINGULAIR) 10 MG tablet TAKE 1 TABLET(10 MG) BY MOUTH AT BEDTIME 90 tablet 1  . omeprazole (PRILOSEC) 20 MG capsule TAKE 1 CAPSULE BY MOUTH DAILY 90 capsule 1  . rOPINIRole (REQUIP) 2 MG tablet TAKE 1 TABLET BY MOUTH TWICE DAILY 60 tablet 5  . SPIRIVA HANDIHALER 18 MCG inhalation capsule INHALE CONTENTS OF 1 CAPSULE ONCE DAILY USING HANDIHALER 30 capsule 12    Results for orders placed or performed during the hospital  encounter of 12/25/18 (from the past 48 hour(s))  Lactic acid, plasma     Status: None   Collection Time: 12/25/18  3:02 AM  Result Value Ref Range   Lactic Acid, Venous 1.3 0.5 - 1.9 mmol/L    Comment: Performed at [facility], [address] [place], [place] [address]  CBC with Differential/Platelet     Status: Abnormal   Collection Time: 12/25/18  3:02 AM  Result Value Ref Range   WBC 11.9 (H) 4.0 - 10.5 K/uL   RBC 5.65 4.22 - 5.81 MIL/uL   Hemoglobin 16.7 13.0 - 17.0 g/dL   HCT 60.450.4 54.039.0 - 98.152.0 %   MCV 89.2 80.0 - 100.0 fL   MCH 29.6 26.0 - 34.0 pg   MCHC 33.1 30.0 - 36.0 g/dL   RDW 19.112.8 47.811.5 - 29.515.5 %   Platelets 243 150 - 400 K/uL   nRBC 0.0 0.0 - 0.2 %   Neutrophils Relative %  46 %   Neutro Abs 5.6 1.7 - 7.7 K/uL   Lymphocytes Relative 28 %   Lymphs Abs 3.4 0.7 - 4.0 K/uL   Monocytes Relative 7 %   Monocytes Absolute 0.8 0.1 - 1.0 K/uL   Eosinophils Relative 17 %   Eosinophils Absolute 2.0 (H) 0.0 - 0.5 K/uL   Basophils Relative 2 %   Basophils Absolute 0.2 (H) 0.0 - 0.1 K/uL   Immature Granulocytes 0 %   Abs Immature Granulocytes 0.03 0.00 - 0.07 K/uL    Comment: Performed at [facility], [address] [place], [place] [address]  Comprehensive metabolic panel     Status: Abnormal   Collection Time: 12/25/18  3:02 AM  Result Value Ref Range   Sodium 140 135 - 145 mmol/L   Potassium 4.0 3.5 - 5.1 mmol/L   Chloride 105 98 - 111 mmol/L   CO2 28 22 - 32 mmol/L   Glucose, Bld 111 (H) 70 - 99 mg/dL   BUN 16 8 - 23 mg/dL   Creatinine, Ser 0.93 0.61 - 1.24 mg/dL   Calcium 10.8 (H) 8.9 - 10.3 mg/dL   Total Protein 7.5 6.5 - 8.1 g/dL   Albumin 4.3 3.5 - 5.0 g/dL   AST 17 15 - 41 U/L   ALT 14 0 - 44 U/L   Alkaline Phosphatase 85 38 - 126 U/L   Total Bilirubin 0.6 0.3 - 1.2 mg/dL   GFR calc non Af Amer >60 >60 mL/min   GFR calc Af Amer >60 >60 mL/min   Anion gap 7 5 - 15    Comment: Performed at [facility], [place], [place] [address]  Troponin I (High Sensitivity)     Status: None   Collection Time: 12/25/18  3:02 AM  Result Value Ref Range   Troponin I (High Sensitivity) 3 <18 ng/L    Comment: (NOTE) Elevated high sensitivity troponin I (hsTnI) values and significant  changes across serial measurements may suggest ACS but many other  chronic and acute conditions are known to elevate hsTnI results.  Refer to the "Links" section for chest pain algorithms and additional  guidance. Performed at [facility], [address] [place], [place] [address]   SARS Coronavirus 2 (CEPHEID- Performed in [facility] hospital lab), Hosp Order     Status: None   Collection Time: 12/25/18  3:02 AM   Specimen: Nasopharyngeal Swab  Result Value Ref Range   SARS Coronavirus 2 NEGATIVE NEGATIVE    Comment: (NOTE) If result is NEGATIVE SARS-CoV-2 target nucleic acids are NOT DETECTED. The SARS-CoV-2 RNA is generally detectable in upper and lower  respiratory specimens during the acute phase of infection. The lowest  concentration of SARS-CoV-2 viral copies this assay can detect is 250  copies / mL. A negative result does not preclude SARS-CoV-2 infection  and should not be used as the sole basis for treatment or other  patient management decisions.  A negative result may occur with  improper specimen collection / handling, submission of specimen other  than nasopharyngeal swab, presence of viral mutation(s) within the  areas targeted by this assay, and inadequate number of viral copies  (<250 copies / mL). A negative result must be combined with clinical  observations, patient history, and epidemiological information. If result is POSITIVE SARS-CoV-2 target nucleic acids are DETECTED. The SARS-CoV-2 RNA is generally detectable in upper and lower  respiratory specimens dur ing the acute phase of infection.  Positive  results are indicative of active infection with SARS-CoV-2.  Clinical  correlation  with patient history and other diagnostic information is  necessary to determine patient infection status.  Positive results do  not rule out bacterial infection or co-infection with other viruses. If result is PRESUMPTIVE POSTIVE SARS-CoV-2 nucleic acids MAY BE PRESENT.   A presumptive positive result was obtained on the submitted specimen  and confirmed on repeat testing.  While 2019 novel coronavirus  (SARS-CoV-2) nucleic acids may be present in the submitted sample  additional confirmatory testing may be necessary for epidemiological  and / or clinical management purposes  to differentiate between  SARS-CoV-2 and other Sarbecovirus currently known to infect humans.  If clinically indicated additional testing with an alternate test  methodology [phone]([id number]) is advised. The SARS-CoV-2 RNA is generally  detectable in upper and lower respiratory sp ecimens during the acute  phase of infection. The expected result is Negative. Fact Sheet for Patients:  BoilerBrush.com.cyhttps://www.fda.gov/media/136312/download Fact Sheet for Healthcare Providers: https://pope.com/https://www.fda.gov/media/136313/download This test is not yet approved or cleared by the [place] FDA and has been authorized for detection and/or diagnosis of SARS-CoV-2 by FDA under an Emergency Use Authorization (EUA).  This EUA will remain in effect (meaning this test can be used) for the duration of the COVID-19 declaration under Section 564(b)(1) of the Act, 21 U.S.C. section 360bbb-3(b)(1), unless the authorization is terminated or revoked sooner. Performed at [facility], [address] [place], [place] [address]   TSH     Status: None   Collection Time: 12/25/18  3:02 AM  Result Value Ref Range   TSH 1.522 0.350 - 4.500 uIU/mL    Comment: Performed by a 3rd Generation assay with a functional sensitivity of <=0.01 uIU/mL. Performed at [facility], [address] [place], [place] [address]   Blood gas, venous     Status:  Abnormal   Collection Time: 12/25/18  3:09 AM  Result Value Ref Range   FIO2 0.40    Delivery systems BILEVEL POSITIVE AIRWAY PRESSURE    pH, Ven 7.32 7.250 - 7.430   pCO2, Ven 56 44.0 - 60.0 mmHg   pO2, Ven 38.0 32.0 - 45.0 mmHg   Bicarbonate 28.9 (H) 20.0 - 28.0 mmol/L   Acid-Base Excess 1.6 0.0 - 2.0 mmol/L   O2 Saturation 66.8 %   Patient temperature 37.0    Collection site VENOUS    Sample type VENOUS     Comment: Performed at [facility], [address] [place], [place] [address]  SARS Coronavirus 2 [facility](Hospital order, Performed in [facility] hospital lab)     Status: None   Collection Time: 12/25/18  3:40 AM  Result Value Ref Range   SARS Coronavirus 2 NEGATIVE NEGATIVE    Comment: (NOTE) If result is NEGATIVE SARS-CoV-2 target nucleic acids are NOT DETECTED. The SARS-CoV-2 RNA is generally detectable in upper and lower  respiratory specimens during the acute phase of infection. The lowest  concentration of SARS-CoV-2 viral copies this assay can detect is 250  copies / mL. A negative result does not preclude SARS-CoV-2 infection  and should not be used as the sole basis for treatment or other  patient management decisions.  A negative result may occur with  improper specimen collection / handling, submission of specimen other  than nasopharyngeal swab, presence of viral mutation(s) within the  areas targeted by this assay, and inadequate number of viral copies  (<250 copies / mL). A negative result  must be combined with clinical  observations, patient history, and epidemiological information. If result is POSITIVE SARS-CoV-2 target nucleic acids are DETECTED. The SARS-CoV-2 RNA is generally detectable in upper and lower  respiratory specimens dur ing the acute phase of infection.  Positive  results are indicative of active infection with SARS-CoV-2.  Clinical  correlation with patient history and other diagnostic information is  necessary to determine patient  infection status.  Positive results do  not rule out bacterial infection or co-infection with other viruses. If result is PRESUMPTIVE POSTIVE SARS-CoV-2 nucleic acids MAY BE PRESENT.   A presumptive positive result was obtained on the submitted specimen  and confirmed on repeat testing.  While 2019 novel coronavirus  (SARS-CoV-2) nucleic acids may be present in the submitted sample  additional confirmatory testing may be necessary for epidemiological  and / or clinical management purposes  to differentiate between  SARS-CoV-2 and other Sarbecovirus currently known to infect humans.  If clinically indicated additional testing with an alternate test  methodology [phone]) is advised. The SARS-CoV-2 RNA is generally  detectable in upper and lower respiratory sp ecimens during the acute  phase of infection. The expected result is Negative. Fact Sheet for Patients:  BoilerBrush.com.cy Fact Sheet for Healthcare Providers: https://pope.com/ This test is not yet approved or cleared by the [place] FDA and has been authorized for detection and/or diagnosis of SARS-CoV-2 by FDA under an Emergency Use Authorization (EUA).  This EUA will remain in effect (meaning this test can be used) for the duration of the COVID-19 declaration under Section 564(b)(1) of the Act, 21 U.S.C. section 360bbb-3(b)(1), unless the authorization is terminated or revoked sooner. Performed at [facility], [address] [place], [place] [address]   Glucose, capillary     Status: Abnormal   Collection Time: 12/25/18  6:13 AM  Result Value Ref Range   Glucose-Capillary 130 (H) 70 - 99 mg/dL   Dg Chest Portable 1 View  Result Date: 12/25/2018 CLINICAL DATA:  Shortness of breath. EXAM: PORTABLE CHEST 1 VIEW COMPARISON:  Oct 29, 2018 FINDINGS: The heart size is normal. There are prominent interstitial lung markings bilaterally which is stable from prior  studies. There is no pneumothorax. The lungs appear hyperexpanded. There is no focal area of consolidation. No large pleural effusion. IMPRESSION: Hyperexpanded lungs with stable prominent interstitial lung markings bilaterally. Electronically Signed   By: Katherine Mantle M.D.   On: 12/25/2018 03:38    Review of Systems  Constitutional: Negative for chills and fever.  HENT: Negative for sore throat and tinnitus.   Eyes: Negative for blurred vision and redness.  Respiratory: Positive for shortness of breath. Negative for cough.   Cardiovascular: Negative for chest pain, palpitations, orthopnea and PND.  Gastrointestinal: Negative for abdominal pain, diarrhea, nausea and vomiting.  Genitourinary: Negative for dysuria, frequency and urgency.  Musculoskeletal: Negative for joint pain and myalgias.  Skin: Negative for rash.       No lesions  Neurological: Negative for speech change, focal weakness and weakness.  Endo/Heme/Allergies: Does not bruise/bleed easily.       No temperature intolerance  Psychiatric/Behavioral: Negative for depression and suicidal ideas.    Blood pressure (!) 140/93, pulse 73, temperature (!) 97.5 F (36.4 C), temperature source Axillary, resp. rate 20, height 6' (1.829 m), weight 66.6 kg, SpO2 100 %. Physical Exam  Constitutional: He is oriented to person, place, and time. He appears well-developed and well-nourished. No distress.  HENT:  Head: Normocephalic and atraumatic.  Mouth/Throat: Oropharynx is clear and moist.  Eyes: Pupils are equal, round, and reactive to light. Conjunctivae and EOM are normal. No scleral icterus.  Neck: Normal range of motion. Neck supple. No JVD present. No tracheal deviation present. No thyromegaly present.  Cardiovascular: Normal rate, regular rhythm and normal heart sounds. Exam reveals no gallop and no friction rub.  No murmur heard. Respiratory: Breath sounds normal.  BiPAP  GI: Soft. Bowel sounds are normal. He exhibits no  distension. There is no abdominal tenderness.  Genitourinary:    Genitourinary Comments: Deferred   Musculoskeletal: Normal range of motion.        General: No edema.  Lymphadenopathy:    He has no cervical adenopathy.  Neurological: He is alert and oriented to person, place, and time. No cranial nerve deficit.  Skin: Skin is warm and dry. No rash noted. No erythema.  Psychiatric: He has a normal mood and affect. His behavior is normal. Judgment and thought content normal.     Assessment/Plan This is a 63 year old male admitted for respiratory failure. 1.  Respiratory failure: Acute on chronic; with hypoxemia.  The patient breathing comfortably on BiPAP but admittedly exhausted.  Continue supplemental O2.  The patient will likely be able to wean from BiPAP quickly. 2.  COPD: Given Solu-Medrol and multiple breathing treatments.  Continue inhaled corticosteroid with long-acting bronchial agonist.  Schedule albuterol treatments every 4 hours.  Taper prednisone.  Consider azithromycin for anti-inflammatory effect. 3.  Hypertension: Initially uncontrolled but now improved.  Continue lisinopril 4.  Depression: Stable 5.  DVT prophylaxis: Lovenox 6.  GI prophylaxis: Pantoprazole per home regimen The patient is a full code.  I have personally spent 45 minutes in critical care time with this patient.  Arnaldo Nataliamond,  Kainalu Heggs S, MD 12/25/2018, 7:19 AM

## 2018-12-25 NOTE — Progress Notes (Signed)
Pt signed AMA form, IVs removed. Pt dressed himself and staff walked with him down to show him how to get to where his car is so he would not get lost in hospital.

## 2018-12-27 LAB — HEMOGLOBIN A1C
Hgb A1c MFr Bld: 5.3 % (ref 4.8–5.6)
Mean Plasma Glucose: 105 mg/dL

## 2018-12-28 ENCOUNTER — Institutional Professional Consult (permissible substitution): Payer: Medicare Other | Admitting: Internal Medicine

## 2018-12-29 ENCOUNTER — Emergency Department: Payer: Medicare Other

## 2018-12-29 ENCOUNTER — Inpatient Hospital Stay
Admission: EM | Admit: 2018-12-29 | Discharge: 2018-12-31 | DRG: 190 | Discharge status: Against Medical Advice | Payer: Medicare Other | Attending: Internal Medicine | Admitting: Internal Medicine

## 2018-12-29 ENCOUNTER — Other Ambulatory Visit: Payer: Self-pay

## 2018-12-29 ENCOUNTER — Ambulatory Visit: Payer: Medicare Other | Admitting: Internal Medicine

## 2018-12-29 DIAGNOSIS — K219 Gastro-esophageal reflux disease without esophagitis: Secondary | ICD-10-CM | POA: Diagnosis present

## 2018-12-29 DIAGNOSIS — J9600 Acute respiratory failure, unspecified whether with hypoxia or hypercapnia: Secondary | ICD-10-CM

## 2018-12-29 DIAGNOSIS — Z7951 Long term (current) use of inhaled steroids: Secondary | ICD-10-CM

## 2018-12-29 DIAGNOSIS — Z8249 Family history of ischemic heart disease and other diseases of the circulatory system: Secondary | ICD-10-CM

## 2018-12-29 DIAGNOSIS — F410 Panic disorder [episodic paroxysmal anxiety] without agoraphobia: Secondary | ICD-10-CM | POA: Diagnosis present

## 2018-12-29 DIAGNOSIS — Z5329 Procedure and treatment not carried out because of patient's decision for other reasons: Secondary | ICD-10-CM | POA: Diagnosis not present

## 2018-12-29 DIAGNOSIS — J209 Acute bronchitis, unspecified: Secondary | ICD-10-CM | POA: Diagnosis present

## 2018-12-29 DIAGNOSIS — R0603 Acute respiratory distress: Secondary | ICD-10-CM | POA: Diagnosis not present

## 2018-12-29 DIAGNOSIS — Z9119 Patient's noncompliance with other medical treatment and regimen: Secondary | ICD-10-CM

## 2018-12-29 DIAGNOSIS — J9620 Acute and chronic respiratory failure, unspecified whether with hypoxia or hypercapnia: Secondary | ICD-10-CM

## 2018-12-29 DIAGNOSIS — Z79891 Long term (current) use of opiate analgesic: Secondary | ICD-10-CM

## 2018-12-29 DIAGNOSIS — Z79899 Other long term (current) drug therapy: Secondary | ICD-10-CM

## 2018-12-29 DIAGNOSIS — J441 Chronic obstructive pulmonary disease with (acute) exacerbation: Principal | ICD-10-CM | POA: Diagnosis present

## 2018-12-29 DIAGNOSIS — F419 Anxiety disorder, unspecified: Secondary | ICD-10-CM | POA: Diagnosis present

## 2018-12-29 DIAGNOSIS — G2581 Restless legs syndrome: Secondary | ICD-10-CM | POA: Diagnosis present

## 2018-12-29 DIAGNOSIS — F329 Major depressive disorder, single episode, unspecified: Secondary | ICD-10-CM | POA: Diagnosis present

## 2018-12-29 DIAGNOSIS — F17290 Nicotine dependence, other tobacco product, uncomplicated: Secondary | ICD-10-CM | POA: Diagnosis present

## 2018-12-29 DIAGNOSIS — Z803 Family history of malignant neoplasm of breast: Secondary | ICD-10-CM

## 2018-12-29 DIAGNOSIS — Z7952 Long term (current) use of systemic steroids: Secondary | ICD-10-CM

## 2018-12-29 DIAGNOSIS — I10 Essential (primary) hypertension: Secondary | ICD-10-CM | POA: Diagnosis present

## 2018-12-29 DIAGNOSIS — Z8042 Family history of malignant neoplasm of prostate: Secondary | ICD-10-CM

## 2018-12-29 DIAGNOSIS — J9601 Acute respiratory failure with hypoxia: Secondary | ICD-10-CM | POA: Diagnosis present

## 2018-12-29 DIAGNOSIS — Z1159 Encounter for screening for other viral diseases: Secondary | ICD-10-CM

## 2018-12-29 DIAGNOSIS — Z973 Presence of spectacles and contact lenses: Secondary | ICD-10-CM

## 2018-12-29 DIAGNOSIS — I248 Other forms of acute ischemic heart disease: Secondary | ICD-10-CM | POA: Diagnosis present

## 2018-12-29 DIAGNOSIS — J9602 Acute respiratory failure with hypercapnia: Secondary | ICD-10-CM | POA: Diagnosis present

## 2018-12-29 LAB — BLOOD GAS, VENOUS
Acid-Base Excess: 0.2 mmol/L (ref 0.0–2.0)
Acid-base deficit: 0.6 mmol/L (ref 0.0–2.0)
Bicarbonate: 31 mmol/L — ABNORMAL HIGH (ref 20.0–28.0)
Bicarbonate: 29.2 mmol/L — ABNORMAL HIGH (ref 20.0–28.0)
Delivery systems: POSITIVE
Delivery systems: POSITIVE
FIO2: 0.9
FIO2: 0.9
O2 Saturation: 25.6 %
O2 Saturation: 27.3 %
PEEP: 8 cmH2O
PEEP: 8 cmH2O
Patient temperature: 37
Patient temperature: 37
Pressure support: 14 cmH2O
Pressure support: 14 cmH2O
RATE: 12 resp/min
pCO2, Ven: 83 mmHg (ref 44.0–60.0)
pCO2, Ven: 73 mmHg (ref 44.0–60.0)
pH, Ven: 7.18 — CL (ref 7.250–7.430)
pH, Ven: 7.21 — ABNORMAL LOW (ref 7.250–7.430)
pO2, Ven: <31 mmHg — CL (ref 32.0–45.0)
pO2, Ven: <31 mmHg — CL (ref 32.0–45.0)

## 2018-12-29 LAB — TROPONIN I (HIGH SENSITIVITY)
Troponin I (High Sensitivity): 4 ng/L (ref <18)
Troponin I (High Sensitivity): 25 ng/L — ABNORMAL HIGH (ref <18)

## 2018-12-29 LAB — CBC WITH DIFFERENTIAL/PLATELET
Abs Immature Granulocytes: 0.12 10*3/uL — ABNORMAL HIGH (ref 0.00–0.07)
Basophils Absolute: 0.3 10*3/uL — ABNORMAL HIGH (ref 0.0–0.1)
Basophils Relative: 1 %
Eosinophils Absolute: 2.4 10*3/uL — ABNORMAL HIGH (ref 0.0–0.5)
Eosinophils Relative: 10 %
HCT: 53.1 % — ABNORMAL HIGH (ref 39.0–52.0)
Hemoglobin: 17.7 g/dL — ABNORMAL HIGH (ref 13.0–17.0)
Immature Granulocytes: 1 %
Lymphocytes Relative: 21 %
Lymphs Abs: 4.8 10*3/uL — ABNORMAL HIGH (ref 0.7–4.0)
MCH: 29.6 pg (ref 26.0–34.0)
MCHC: 33.3 g/dL (ref 30.0–36.0)
MCV: 88.8 fL (ref 80.0–100.0)
Monocytes Absolute: 1.4 10*3/uL — ABNORMAL HIGH (ref 0.1–1.0)
Monocytes Relative: 6 %
Neutro Abs: 13.8 10*3/uL — ABNORMAL HIGH (ref 1.7–7.7)
Neutrophils Relative %: 61 %
Platelets: 306 10*3/uL (ref 150–400)
RBC: 5.98 MIL/uL — ABNORMAL HIGH (ref 4.22–5.81)
RDW: 12.8 % (ref 11.5–15.5)
Smear Review: NORMAL
WBC: 22.6 10*3/uL — ABNORMAL HIGH (ref 4.0–10.5)
nRBC: 0 % (ref 0.0–0.2)

## 2018-12-29 LAB — COMPREHENSIVE METABOLIC PANEL
ALT: 17 U/L (ref 0–44)
AST: 19 U/L (ref 15–41)
Albumin: 4.5 g/dL (ref 3.5–5.0)
Alkaline Phosphatase: 88 U/L (ref 38–126)
Anion gap: 10 (ref 5–15)
BUN: 9 mg/dL (ref 8–23)
CO2: 24 mmol/L (ref 22–32)
Calcium: 9.9 mg/dL (ref 8.9–10.3)
Chloride: 106 mmol/L (ref 98–111)
Creatinine, Ser: 0.87 mg/dL (ref 0.61–1.24)
GFR calc Af Amer: >60 mL/min (ref >60)
GFR calc non Af Amer: >60 mL/min (ref >60)
Glucose, Bld: 151 mg/dL — ABNORMAL HIGH (ref 70–99)
Potassium: 4.1 mmol/L (ref 3.5–5.1)
Sodium: 140 mmol/L (ref 135–145)
Total Bilirubin: 0.6 mg/dL (ref 0.3–1.2)
Total Protein: 7.6 g/dL (ref 6.5–8.1)

## 2018-12-29 LAB — MAGNESIUM: Magnesium: 2.3 mg/dL (ref 1.7–2.4)

## 2018-12-29 LAB — SARS CORONAVIRUS 2 BY RT PCR (HOSPITAL ORDER, PERFORMED IN ~~LOC~~ HOSPITAL LAB): SARS Coronavirus 2: NEGATIVE

## 2018-12-29 LAB — PROCALCITONIN: Procalcitonin: <0.1 ng/mL

## 2018-12-29 LAB — LACTIC ACID, PLASMA: Lactic Acid, Venous: 1.9 mmol/L (ref 0.5–1.9)

## 2018-12-29 MED ORDER — LORAZEPAM 2 MG/ML IJ SOLN
INTRAMUSCULAR | Status: AC
  Administered 2018-12-29: 1 mg
  Filled 2018-12-29: qty 1

## 2018-12-29 MED ORDER — SODIUM CHLORIDE 0.9 % IV BOLUS
500.0000 mL | Freq: Once | INTRAVENOUS | Status: AC
  Administered 2018-12-29: 500 mL via INTRAVENOUS

## 2018-12-29 MED ORDER — METHYLPREDNISOLONE SODIUM SUCC 125 MG IJ SOLR
125.0000 mg | Freq: Once | INTRAMUSCULAR | Status: AC
  Administered 2018-12-29: 21:00:00 125 mg via INTRAVENOUS
  Filled 2018-12-29: qty 2

## 2018-12-29 MED ORDER — NITROGLYCERIN IN D5W 200-5 MCG/ML-% IV SOLN
INTRAVENOUS | Status: AC
  Administered 2018-12-29: 20:00:00
  Filled 2018-12-29: qty 250

## 2018-12-29 MED ORDER — VANCOMYCIN HCL 10 G IV SOLR
1750.0000 mg | Freq: Once | INTRAVENOUS | Status: AC
  Administered 2018-12-29: 1750 mg via INTRAVENOUS
  Filled 2018-12-29: qty 1750

## 2018-12-29 MED ORDER — MAGNESIUM SULFATE 2 GM/50ML IV SOLN
2.0000 g | Freq: Once | INTRAVENOUS | Status: AC
  Administered 2018-12-29: 2 g via INTRAVENOUS
  Filled 2018-12-29: qty 50

## 2018-12-29 MED ORDER — SODIUM CHLORIDE 0.9 % IV SOLN
2.0000 g | Freq: Once | INTRAVENOUS | Status: AC
  Administered 2018-12-29: 2 g via INTRAVENOUS
  Filled 2018-12-29: qty 2

## 2018-12-29 MED ORDER — VANCOMYCIN HCL IN DEXTROSE 1-5 GM/200ML-% IV SOLN: 1000.0000 mg | Freq: Once | INTRAVENOUS | Status: DC

## 2018-12-29 MED ORDER — IPRATROPIUM-ALBUTEROL 0.5-2.5 (3) MG/3ML IN SOLN
3.0000 mL | Freq: Once | RESPIRATORY_TRACT | Status: AC
  Administered 2018-12-29: 3 mL via RESPIRATORY_TRACT

## 2018-12-29 NOTE — ED Notes (Signed)
Radiology called for stat CXR verbal Quentin Cornwall at bedside.

## 2018-12-29 NOTE — ED Triage Notes (Signed)
Pt in via [facility] from home d/t resp distress. History of COPD. Pt had completed nebs at home without relief. 18g L fa placed by EMS. EMS gave solu medrol 125. Pt on CPAP upon arrival to ER. Pt alert and sweaty. Pt hypertensive with EMS systolic in 358I and diastolic in 518F.

## 2018-12-29 NOTE — ED Notes (Signed)
Butch RN and this RN attempting to collect blood cultures now. Pt now combative with RT and ripped off BiPAP mask. Pt anxious. EDP Quentin Cornwall at bedside. Verbal to give 1 of Ativan.

## 2018-12-29 NOTE — ED Provider Notes (Signed)
[facility] Emergency Department Provider Note    First MD Initiated Contact with Patient 12/29/18 2021     (approximate)  I have reviewed the triage vital signs and the nursing notes.   HISTORY  Chief Complaint Respiratory Distress  Level V Caveat:  resp distress  HPI David Rhodes is a 63 y.o. male close past medical history presents the ER for shortness of breath and in respiratory distress.  EMS was called out and found the patient tripoding giving himself nebulizers.  Was found to be hypoxic and was placed on CPAP.  Patient becoming very anxious in route.  Will provide much additional history due to respiratory distress.    Past Medical History:  Diagnosis Date  . Allergy    Hay fever  . Anxiety   . Asthma   . Chronic pain due to trauma   . COPD (chronic obstructive pulmonary disease) (HCC)   . Depression   . Essential tremor   . GERD (gastroesophageal reflux disease)   . Headache(784.0)    neck pain  . Hypertension   . Inguinal hernia    bilateral  . Neuromuscular disorder (HCC)    hx of tremors  . Pneumonia    many years ago  . Shortness of breath    when allergies flare up  . Urine incontinence   . Wears glasses    Family History  Problem Relation Age of Onset  . Myoclonus Mother   . Breast cancer Mother   . Prostate cancer Father   . Heart disease Paternal Grandfather   . Myoclonus Maternal Uncle    Past Surgical History:  Procedure Laterality Date  . DEEP BRAIN STIMULATOR PLACEMENT    . INGUINAL HERNIA REPAIR Bilateral 02/07/2014   Procedure:  BILATERAL INGUINAL HERNIA REPAIR;  Surgeon: Axel Filler, MD;  Location: [facility] OR;  Service: General;  Laterality: Bilateral;  . INSERTION OF MESH Bilateral 02/07/2014   Procedure: INSERTION OF MESH;  Surgeon: Axel Filler, MD;  Location: [facility] OR;  Service: General;  Laterality: Bilateral;  . NASAL SINUS SURGERY    . PULSE GENERATOR IMPLANT Left 09/11/2016   Procedure: Left  chest-Change implantable pulse generator battery;  Surgeon: Maeola Harman, MD;  Location: [facility] OR;  Service: Neurosurgery;  Laterality: Left;  Left chest-Change implantable pulse generator battery  . PULSE GENERATOR IMPLANT Left 04/06/2018   Procedure: Revision of left chest implantable pulse generator, extension, and pocket adapter;  Surgeon: Maeola Harman, MD;  Location: [facility] OR;  Service: Neurosurgery;  Laterality: Left;  . SUBTHALAMIC STIMULATOR BATTERY REPLACEMENT  12/09/2011   Procedure: SUBTHALAMIC STIMULATOR BATTERY REPLACEMENT;  Surgeon: Maeola Harman, MD;  Location: [facility] NEURO ORS;  Service: Neurosurgery;  Laterality: N/A;   deep brain stimulator, implantable pulse generator change  . SUBTHALAMIC STIMULATOR BATTERY REPLACEMENT Left 04/21/2014   Procedure: Deep brain stimulator battery change;  Surgeon: Maeola Harman, MD;  Location: [facility] NEURO ORS;  Service: Neurosurgery;  Laterality: Left;  Deep brain stimulator battery change  . SUBTHALAMIC STIMULATOR BATTERY REPLACEMENT N/A 08/10/2018   Procedure: Replacement of deep brain stimulator pulse generator/left abdomen with long lead extensions;  Surgeon: Maeola Harman, MD;  Location: [facility] OR;  Service: Neurosurgery;  Laterality: N/A;   Patient Active Problem List   Diagnosis Date Noted  . Acute on chronic respiratory failure with hypoxemia (HCC) 12/25/2018  . Inguinal hernia recurrent bilateral 11/16/2018  . Chronic left shoulder pain 10/05/2018  . Tremor 08/10/2018  . PICC (peripherally inserted central catheter) in place  04/16/2018  . 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 Essential tremor 10/18/2017  . Cervical radiculopathy 07/23/2017  . Bilateral temporomandibular joint pain 07/23/2017  . Neck pain 05/19/2017  . Medication monitoring encounter 09/11/2016  . Pulmonary nodules 12/31/2015  . COPD (chronic obstructive pulmonary disease) (HCC) 12/27/2015  . Chronic cough 12/27/2015  . Asthma 09/24/2015  . Allergic rhinitis 11/11/2014  .  Nerve root pain 11/11/2014  . Dyskinesia 11/11/2014  . Acid reflux 11/11/2014  . Calcium blood increased 11/11/2014  . Affective disorder (HCC) 11/11/2014  . Essential hypertension 12/25/2011  . Chronic back pain 12/25/2011      Prior to Admission medications   Medication Sig Start Date End Date Taking? Authorizing Provider  albuterol (PROVENTIL HFA;VENTOLIN HFA) 108 (90 Base) MCG/ACT inhaler INHALE 1 TO 2 PUFFS BY MOUTH EVERY 6 HOURS AS NEEDED FOR WHEEZING OR SHORTNESS OF BREATH Patient taking differently: Inhale 1-2 puffs into the lungs every 6 (six) hours as needed for wheezing or shortness of breath.  07/02/18   Reubin MilanBerglund, Laura H, MD  baclofen (LIORESAL) 10 MG tablet TAKE 1 TABLET(10 MG) BY MOUTH THREE TIMES DAILY Patient not taking: Reported on 12/25/2018 12/05/18   Reubin MilanBerglund, Laura H, MD  BREO ELLIPTA 100-25 MCG/INH AEPB INHALE 1 PUFF INTO THE LUNGS DAILY Patient not taking: Reported on 12/25/2018 12/05/18   Reubin MilanBerglund, Laura H, MD  cephALEXin (KEFLEX) 500 MG capsule Take 1 capsule (500 mg total) by mouth 2 (two) times daily. Patient not taking: Reported on 12/25/2018 11/16/18   Ginnie SmartHatcher, Jeffrey C, MD  gabapentin (NEURONTIN) 600 MG tablet TAKE 1 TABLET(600 MG) BY MOUTH FOUR TIMES DAILY Patient taking differently: Take 600 mg by mouth 4 (four) times daily.  11/09/18   Reubin MilanBerglund, Laura H, MD  HYDROcodone-acetaminophen (NORCO/VICODIN) 5-325 MG tablet Take 1-2 tablets by mouth every 4 (four) hours as needed for severe pain ((score 7 to 10)). Patient not taking: Reported on 12/25/2018 08/11/18   Maeola HarmanStern, Joseph, MD  lisinopril (ZESTRIL) 40 MG tablet Take 1 tablet (40 mg total) by mouth daily. 12/05/18   Reubin MilanBerglund, Laura H, MD  loratadine (CLARITIN) 10 MG tablet Take 1 tablet (10 mg total) by mouth daily. Patient taking differently: Take 10 mg by mouth daily as needed for allergies.  10/13/17   Enedina FinnerPatel, Sona, MD  meloxicam (MOBIC) 15 MG tablet TAKE 1 TABLET(15 MG) BY MOUTH DAILY Patient not taking: Reported on  12/25/2018 12/06/18   Reubin MilanBerglund, Laura H, MD  montelukast (SINGULAIR) 10 MG tablet TAKE 1 TABLET(10 MG) BY MOUTH AT BEDTIME Patient taking differently: Take 10 mg by mouth at bedtime.  12/05/18   Reubin MilanBerglund, Laura H, MD  omeprazole (PRILOSEC) 20 MG capsule TAKE 1 CAPSULE BY MOUTH DAILY Patient taking differently: Take 20 mg by mouth daily.  12/05/18   Reubin MilanBerglund, Laura H, MD  rOPINIRole (REQUIP) 2 MG tablet TAKE 1 TABLET BY MOUTH TWICE DAILY Patient taking differently: Take 2 mg by mouth 2 (two) times daily.  10/10/18   Reubin MilanBerglund, Laura H, MD  SPIRIVA HANDIHALER 18 MCG inhalation capsule INHALE CONTENTS OF 1 CAPSULE ONCE DAILY USING HANDIHALER Patient taking differently: Place 18 mcg into inhaler and inhale daily.  10/02/18   Reubin MilanBerglund, Laura H, MD    Allergies Penicillins    Social History Social History   Tobacco Use  . Smoking status: Never Smoker  . Smokeless tobacco: Current User    Types: Snuff  Substance Use Topics  . Alcohol use: No    Alcohol/week: 0.0 standard drinks  .  Drug use: No    Review of Systems Patient denies headaches, rhinorrhea, blurry vision, numbness, shortness of breath, chest pain, edema, cough, abdominal pain, nausea, vomiting, diarrhea, dysuria, fevers, rashes or hallucinations unless otherwise stated above in HPI. ____________________________________________   PHYSICAL EXAM:  VITAL SIGNS: Vitals:   12/29/18 2307 12/29/18 2315  BP:    Pulse: (!) 102 94  Resp: (!) 27 (!) 24  Temp:    SpO2: 100% 99%    Constitutional: Alert, anxious appearing, tripoding in respiratory distress, diaphoretic Eyes: Conjunctivae are normal.  Head: Atraumatic. Nose: No congestion/rhinnorhea. Mouth/Throat: Mucous membranes are moist.   Neck: No stridor. Painless ROM.  Cardiovascular: tachycardic, regular rhythm. Grossly normal heart sounds.  Good peripheral circulation. Respiratory: tripoding, severe respiratory distress, No retractions. Lungs with use of accessory muscles.  Gastrointestinal: Soft and nontender. No distention. No abdominal bruits. No CVA tenderness. Musculoskeletal: No lower extremity tenderness nor edema.  No joint effusions. Neurologic:unable to assess severe resp distress Skin:  Skin is warm, dry and intact. No rash noted. Psychiatric: Mood and affect are normal. Speech and behavior are normal.  ____________________________________________   LABS (all labs ordered are listed, but only abnormal results are displayed)  Results for orders placed or performed during the hospital encounter of 12/29/18 (from the past 24 hour(s))  Troponin I (High Sensitivity)     Status: Abnormal   Collection Time: 12/29/18  8:13 PM  Result Value Ref Range   Troponin I (High Sensitivity) 25 (H) <18 ng/L  CBC with Differential/Platelet     Status: Abnormal   Collection Time: 12/29/18  8:14 PM  Result Value Ref Range   WBC 22.6 (H) 4.0 - 10.5 K/uL   RBC 5.98 (H) 4.22 - 5.81 MIL/uL   Hemoglobin 17.7 (H) 13.0 - 17.0 g/dL   HCT 16.153.1 (H) 09.639.0 - 04.552.0 %   MCV 88.8 80.0 - 100.0 fL   MCH 29.6 26.0 - 34.0 pg   MCHC 33.3 30.0 - 36.0 g/dL   RDW 40.912.8 81.111.5 - 91.415.5 %   Platelets 306 150 - 400 K/uL   nRBC 0.0 0.0 - 0.2 %   Neutrophils Relative % 61 %   Neutro Abs 13.8 (H) 1.7 - 7.7 K/uL   Lymphocytes Relative 21 %   Lymphs Abs 4.8 (H) 0.7 - 4.0 K/uL   Monocytes Relative 6 %   Monocytes Absolute 1.4 (H) 0.1 - 1.0 K/uL   Eosinophils Relative 10 %   Eosinophils Absolute 2.4 (H) 0.0 - 0.5 K/uL   Basophils Relative 1 %   Basophils Absolute 0.3 (H) 0.0 - 0.1 K/uL   RBC Morphology MORPHOLOGY UNREMARKABLE    Smear Review Normal platelet morphology    Immature Granulocytes 1 %   Abs Immature Granulocytes 0.12 (H) 0.00 - 0.07 K/uL  Comprehensive metabolic panel     Status: Abnormal   Collection Time: 12/29/18  8:14 PM  Result Value Ref Range   Sodium 140 135 - 145 mmol/L   Potassium 4.1 3.5 - 5.1 mmol/L   Chloride 106 98 - 111 mmol/L   CO2 24 22 - 32 mmol/L    Glucose, Bld 151 (H) 70 - 99 mg/dL   BUN 9 8 - 23 mg/dL   Creatinine, Ser 7.820.87 0.61 - 1.24 mg/dL   Calcium 9.9 8.9 - 95.610.3 mg/dL   Total Protein 7.6 6.5 - 8.1 g/dL   Albumin 4.5 3.5 - 5.0 g/dL   AST 19 15 - 41 U/L   ALT 17 0 - 44  U/L   Alkaline Phosphatase 88 38 - 126 U/L   Total Bilirubin 0.6 0.3 - 1.2 mg/dL   GFR calc non Af Amer >60 >60 mL/min   GFR calc Af Amer >60 >60 mL/min   Anion gap 10 5 - 15  Troponin I (High Sensitivity)     Status: None   Collection Time: 12/29/18  8:14 PM  Result Value Ref Range   Troponin I (High Sensitivity) 4 <18 ng/L  Magnesium     Status: None   Collection Time: 12/29/18  8:14 PM  Result Value Ref Range   Magnesium 2.3 1.7 - 2.4 mg/dL  Procalcitonin     Status: None   Collection Time: 12/29/18  8:14 PM  Result Value Ref Range   Procalcitonin <0.10 ng/mL  Lactic acid, plasma     Status: None   Collection Time: 12/29/18  8:15 PM  Result Value Ref Range   Lactic Acid, Venous 1.9 0.5 - 1.9 mmol/L  SARS Coronavirus 2 (CEPHEID- [facility] Hosp Order     Status: None   Collection Time: 12/29/18  8:39 PM   Specimen: Nasopharyngeal Swab  Result Value Ref Range   SARS Coronavirus 2 NEGATIVE NEGATIVE  Blood gas, venous     Status: Abnormal   Collection Time: 12/29/18  8:54 PM  Result Value Ref Range   FIO2 0.90    Delivery systems BILEVEL POSITIVE AIRWAY PRESSURE    LHR 12 resp/min   Peep/cpap 8.0 cm H20   Pressure support 14 cm H20   pH, Ven 7.18 (LL) 7.250 - 7.430   pCO2, Ven 83 (HH) 44.0 - 60.0 mmHg   pO2, Ven <31.0 (LL) 32.0 - 45.0 mmHg   Bicarbonate 31.0 (H) 20.0 - 28.0 mmol/L   Acid-Base Excess 0.2 0.0 - 2.0 mmol/L   O2 Saturation 25.6 %   Patient temperature 37.0    Collection site VEIN    Sample type VENOUS   Blood gas, venous     Status: Abnormal   Collection Time: 12/29/18  9:27 PM  Result Value Ref Range   FIO2 0.90    Delivery systems BILEVEL POSITIVE AIRWAY PRESSURE    Peep/cpap 8.0 cm H20    Pressure support 14 cm H20   pH, Ven 7.21 (L) 7.250 - 7.430   pCO2, Ven 73 (HH) 44.0 - 60.0 mmHg   pO2, Ven <31.0 (LL) 32.0 - 45.0 mmHg   Bicarbonate 29.2 (H) 20.0 - 28.0 mmol/L   Acid-base deficit 0.6 0.0 - 2.0 mmol/L   O2 Saturation 27.3 %   Patient temperature 37.0    Collection site VEIN    Sample type VENOUS    ____________________________________________  EKG My review and personal interpretation at Time: 20:16   Indication: resp distress  Rate: 130  Rhythm: sinus Axis: normal Other: nonspecific st abn, no stemi, likely rate dependent abnormality ____________________________________________  RADIOLOGY  I personally reviewed all radiographic images ordered to evaluate for the above acute complaints and reviewed radiology reports and findings.  These findings were personally discussed with the patient.  Please see medical record for radiology report.  ____________________________________________   PROCEDURES  Procedure(s) performed:  .Critical Care Performed by: Willy Eddyobinson, Aadan Chenier, MD Authorized by: Willy Eddyobinson, Marzelle Rutten, MD   Critical care provider statement:    Critical care time (minutes):  45   Critical care time was exclusive of:  Separately billable procedures and treating other patients   Critical care was necessary to treat or prevent imminent or  life-threatening deterioration of the following conditions:  Respiratory failure   Critical care was time spent personally by me on the following activities:  Development of treatment plan with patient or surrogate, discussions with consultants, evaluation of patient's response to treatment, examination of patient, obtaining history from patient or surrogate, ordering and performing treatments and interventions, ordering and review of laboratory studies, ordering and review of radiographic studies, pulse oximetry, re-evaluation of patient's condition and review of old charts      Critical Care performed: yes  ____________________________________________   INITIAL IMPRESSION / [place] / ED COURSE  Pertinent labs & imaging results that were available during my care of the patient were reviewed by me and considered in my medical decision making (see chart for details).   DDX: Asthma, copd, CHF, pna, ptx, malignancy, Pe, anemia   David Rhodes is a 63 y.o. who presents to the ED with respiratory distress as described above.  Patient afebrile with core temperature but improved on respiratory distress requiring BiPAP.  Patient tripoding.  Mentating appropriately therefore will try to manage on BiPAP.  Bedside ultrasound somewhat limited due to his respiratory distress.  Patient profoundly hypertensive therefore will start on nitro as he is also diaphoretic with concern for flash pulmonary edema.  Denying any chest pain.  Has significant history of COPD which may be driving his respiratory failure.  The patient will be placed on continuous pulse oximetry and telemetry for monitoring.  Laboratory evaluation will be sent to evaluate for the above complaints.     Clinical Course as of Dec 28 2325  Wed Dec 29, 2018  2031 Work of breathing and symptoms are improving.  He satting 100% on BiPAP.  Blood pressure improving weaning off nitro.  Seems more consistent with COPD possible hypertensive urgency but not see any evidence of overt heart failure.   [PR]  2111 Patient with significant hypercapnic respiratory failure.  Clinically is improving therefore will repeat VBG in 30 minutes to ensure that this is improving on BiPAP make determination at that point whether to proceed with intubation.   [PR]  2240 Patient is clinically improving.  Repeat gas also improving.  We will continue with current therapies.  Patient will be admitted to the hospital.  COVID negative.   [PR]    Clinical Course User Index [PR] Merlyn Lot, MD    The patient was evaluated in Emergency Department today for the  symptoms described in the history of present illness. He/she was evaluated in the context of the global COVID-19 pandemic, which necessitated consideration that the patient might be at risk for infection with the SARS-CoV-2 virus that causes COVID-19. Institutional protocols and algorithms that pertain to the evaluation of patients at risk for COVID-19 are in a state of rapid change based on information released by regulatory bodies including the CDC and federal and state organizations. These policies and algorithms were followed during the patient's care in the ED.  As part of my medical decision making, I reviewed the following data within the [place] notes reviewed and incorporated, Labs reviewed, notes from prior ED visits and [place] Controlled Substance Database   ____________________________________________   FINAL CLINICAL IMPRESSION(S) / ED DIAGNOSES  Final diagnoses:  Acute respiratory failure with hypoxia and hypercapnia (HCC)      NEW MEDICATIONS STARTED DURING THIS VISIT:  New Prescriptions   No medications on file     Note:  This document was prepared using Dragon voice recognition software  and may include unintentional dictation errors.    Willy Eddy, MD 12/29/18 [phone]

## 2018-12-29 NOTE — ED Notes (Signed)
Butch RN grabbing ativan.

## 2018-12-29 NOTE — ED Notes (Signed)
Pt requesting to receive his daily gabapentin. Pt understands he cannot take PO meds until breathing better regulated.

## 2018-12-29 NOTE — ED Notes (Signed)
Lab staff to bedside.

## 2018-12-29 NOTE — ED Notes (Addendum)
Attempting for EKG but pt currently too sweaty to keep leads on.

## 2018-12-29 NOTE — ED Notes (Signed)
Rainbow of tubes drawn and sent to lab.

## 2018-12-29 NOTE — ED Notes (Signed)
This RN has tried to pull repeat VBG from each of 3 IV lines available. No success. All lines flush well and do not cause pt pain. No issues noted at sites. Called lab to assist with repeat VBG. RT called and made aware it will be later than desired.

## 2018-12-29 NOTE — Progress Notes (Signed)
PHARMACY -  BRIEF ANTIBIOTIC NOTE   Pharmacy has received consult(s) for vancomycin from an ED provider.  The patient's profile has been reviewed for ht/wt/allergies/indication/available labs.    One time order(s) placed for vancomycin 1.75g IV load and cefepime 2g IV x 1  Further antibiotics/pharmacy consults should be ordered by admitting physician if indicated.                       Thank you,  Tobie Lords, PharmD, BCPS Clinical Pharmacist 12/29/2018  11:07 PM

## 2018-12-29 NOTE — ED Notes (Signed)
RT notified that VBG is ready.

## 2018-12-29 NOTE — ED Notes (Signed)
Repeat trop collected with butterfly stick.

## 2018-12-29 NOTE — ED Notes (Signed)
Pt states to this RN his CP/SOB much better than arrival.

## 2018-12-29 NOTE — ED Notes (Signed)
EKG completed with portable machine.

## 2018-12-29 NOTE — ED Notes (Signed)
Nitro gtt stopped EDP verbal d/t dec in BP.

## 2018-12-29 NOTE — ED Notes (Signed)
EDP Quentin Cornwall notified of 2nd trop result.

## 2018-12-29 NOTE — ED Notes (Signed)
Leads in place but bedside EKG machine malfunctioning.

## 2018-12-29 NOTE — ED Notes (Signed)
2nd sets of cultures drawn. This RN collected 1 set at L hand; blue 5-10cc; red didn't receive much blood before vein blew. 2nd set collected at R ac by [facility].

## 2018-12-30 DIAGNOSIS — I248 Other forms of acute ischemic heart disease: Secondary | ICD-10-CM | POA: Diagnosis present

## 2018-12-30 DIAGNOSIS — J209 Acute bronchitis, unspecified: Secondary | ICD-10-CM | POA: Diagnosis present

## 2018-12-30 DIAGNOSIS — J441 Chronic obstructive pulmonary disease with (acute) exacerbation: Secondary | ICD-10-CM | POA: Diagnosis present

## 2018-12-30 DIAGNOSIS — J9601 Acute respiratory failure with hypoxia: Secondary | ICD-10-CM | POA: Diagnosis present

## 2018-12-30 DIAGNOSIS — Z79899 Other long term (current) drug therapy: Secondary | ICD-10-CM | POA: Diagnosis not present

## 2018-12-30 DIAGNOSIS — G2581 Restless legs syndrome: Secondary | ICD-10-CM | POA: Diagnosis present

## 2018-12-30 DIAGNOSIS — K219 Gastro-esophageal reflux disease without esophagitis: Secondary | ICD-10-CM | POA: Diagnosis present

## 2018-12-30 DIAGNOSIS — Z7951 Long term (current) use of inhaled steroids: Secondary | ICD-10-CM | POA: Diagnosis not present

## 2018-12-30 DIAGNOSIS — Z79891 Long term (current) use of opiate analgesic: Secondary | ICD-10-CM | POA: Diagnosis not present

## 2018-12-30 DIAGNOSIS — Z8042 Family history of malignant neoplasm of prostate: Secondary | ICD-10-CM | POA: Diagnosis not present

## 2018-12-30 DIAGNOSIS — Z7952 Long term (current) use of systemic steroids: Secondary | ICD-10-CM | POA: Diagnosis not present

## 2018-12-30 DIAGNOSIS — F17290 Nicotine dependence, other tobacco product, uncomplicated: Secondary | ICD-10-CM | POA: Diagnosis present

## 2018-12-30 DIAGNOSIS — Z8249 Family history of ischemic heart disease and other diseases of the circulatory system: Secondary | ICD-10-CM | POA: Diagnosis not present

## 2018-12-30 DIAGNOSIS — I10 Essential (primary) hypertension: Secondary | ICD-10-CM | POA: Diagnosis present

## 2018-12-30 DIAGNOSIS — R0603 Acute respiratory distress: Secondary | ICD-10-CM | POA: Diagnosis present

## 2018-12-30 DIAGNOSIS — J9602 Acute respiratory failure with hypercapnia: Secondary | ICD-10-CM | POA: Diagnosis present

## 2018-12-30 DIAGNOSIS — Z803 Family history of malignant neoplasm of breast: Secondary | ICD-10-CM | POA: Diagnosis not present

## 2018-12-30 DIAGNOSIS — F419 Anxiety disorder, unspecified: Secondary | ICD-10-CM | POA: Diagnosis present

## 2018-12-30 DIAGNOSIS — F410 Panic disorder [episodic paroxysmal anxiety] without agoraphobia: Secondary | ICD-10-CM | POA: Diagnosis present

## 2018-12-30 DIAGNOSIS — Z1159 Encounter for screening for other viral diseases: Secondary | ICD-10-CM | POA: Diagnosis not present

## 2018-12-30 DIAGNOSIS — Z9119 Patient's noncompliance with other medical treatment and regimen: Secondary | ICD-10-CM | POA: Diagnosis not present

## 2018-12-30 DIAGNOSIS — Z5329 Procedure and treatment not carried out because of patient's decision for other reasons: Secondary | ICD-10-CM | POA: Diagnosis not present

## 2018-12-30 DIAGNOSIS — F329 Major depressive disorder, single episode, unspecified: Secondary | ICD-10-CM | POA: Diagnosis present

## 2018-12-30 DIAGNOSIS — Z973 Presence of spectacles and contact lenses: Secondary | ICD-10-CM | POA: Diagnosis not present

## 2018-12-30 LAB — BASIC METABOLIC PANEL
Anion gap: 7 (ref 5–15)
BUN: 12 mg/dL (ref 8–23)
CO2: 24 mmol/L (ref 22–32)
Calcium: 9.5 mg/dL (ref 8.9–10.3)
Chloride: 109 mmol/L (ref 98–111)
Creatinine, Ser: 0.85 mg/dL (ref 0.61–1.24)
GFR calc Af Amer: >60 mL/min (ref >60)
GFR calc non Af Amer: >60 mL/min (ref >60)
Glucose, Bld: 164 mg/dL — ABNORMAL HIGH (ref 70–99)
Potassium: 4.8 mmol/L (ref 3.5–5.1)
Sodium: 140 mmol/L (ref 135–145)

## 2018-12-30 LAB — BLOOD GAS, ARTERIAL
Acid-base deficit: 0.2 mmol/L (ref 0.0–2.0)
Bicarbonate: 24 mmol/L (ref 20.0–28.0)
FIO2: 0.28
O2 Saturation: 97.4 %
Patient temperature: 37
pCO2 arterial: 37 mmHg (ref 32.0–48.0)
pH, Arterial: 7.42 (ref 7.350–7.450)
pO2, Arterial: 93 mmHg (ref 83.0–108.0)

## 2018-12-30 LAB — LACTIC ACID, PLASMA: Lactic Acid, Venous: 2.6 mmol/L (ref 0.5–1.9)

## 2018-12-30 LAB — GLUCOSE, CAPILLARY: Glucose-Capillary: 136 mg/dL — ABNORMAL HIGH (ref 70–99)

## 2018-12-30 LAB — CBC
HCT: 46.7 % (ref 39.0–52.0)
Hemoglobin: 15.3 g/dL (ref 13.0–17.0)
MCH: 29.5 pg (ref 26.0–34.0)
MCHC: 32.8 g/dL (ref 30.0–36.0)
MCV: 90.2 fL (ref 80.0–100.0)
Platelets: 237 10*3/uL (ref 150–400)
RBC: 5.18 MIL/uL (ref 4.22–5.81)
RDW: 12.6 % (ref 11.5–15.5)
WBC: 16 10*3/uL — ABNORMAL HIGH (ref 4.0–10.5)
nRBC: 0 % (ref 0.0–0.2)

## 2018-12-30 LAB — PATHOLOGIST SMEAR REVIEW

## 2018-12-30 LAB — TROPONIN I (HIGH SENSITIVITY)
Troponin I (High Sensitivity): 35 ng/L — ABNORMAL HIGH (ref <18)
Troponin I (High Sensitivity): 27 ng/L — ABNORMAL HIGH (ref <18)

## 2018-12-30 MED ORDER — METHYLPREDNISOLONE SODIUM SUCC 40 MG IJ SOLR
40.0000 mg | Freq: Four times a day (QID) | INTRAMUSCULAR | Status: DC
  Administered 2018-12-30: 02:00:00 40 mg via INTRAVENOUS
  Filled 2018-12-30: qty 1

## 2018-12-30 MED ORDER — SODIUM CHLORIDE 0.9 % IV SOLN
500.0000 mg | INTRAVENOUS | Status: AC
  Administered 2018-12-30: 500 mg via INTRAVENOUS
  Filled 2018-12-30: qty 500

## 2018-12-30 MED ORDER — SODIUM CHLORIDE 0.9 % IV SOLN
INTRAVENOUS | Status: DC
  Administered 2018-12-30: 02:00:00 via INTRAVENOUS

## 2018-12-30 MED ORDER — ENOXAPARIN SODIUM 40 MG/0.4ML ~~LOC~~ SOLN
40.0000 mg | SUBCUTANEOUS | Status: DC
  Administered 2018-12-30: 06:00:00 40 mg via SUBCUTANEOUS
  Filled 2018-12-30: qty 0.4

## 2018-12-30 MED ORDER — GUAIFENESIN ER 600 MG PO TB12
600.0000 mg | ORAL_TABLET | Freq: Two times a day (BID) | ORAL | Status: DC
  Administered 2018-12-30 – 2018-12-31 (×3): 600 mg via ORAL
  Filled 2018-12-30 (×3): qty 1

## 2018-12-30 MED ORDER — CHLORHEXIDINE GLUCONATE CLOTH 2 % EX PADS
6.0000 | MEDICATED_PAD | Freq: Every day | CUTANEOUS | Status: DC
  Administered 2018-12-30 – 2018-12-31 (×2): 6 via TOPICAL

## 2018-12-30 MED ORDER — PREDNISONE 20 MG PO TABS
40.0000 mg | ORAL_TABLET | Freq: Every day | ORAL | Status: DC
  Administered 2018-12-31: 40 mg via ORAL
  Filled 2018-12-30: qty 2

## 2018-12-30 MED ORDER — AZITHROMYCIN 500 MG PO TABS: 500.0000 mg | ORAL_TABLET | Freq: Every day | ORAL | Status: DC

## 2018-12-30 MED ORDER — ORAL CARE MOUTH RINSE
15.0000 mL | Freq: Two times a day (BID) | OROMUCOSAL | Status: DC
  Administered 2018-12-30 – 2018-12-31 (×3): 15 mL via OROMUCOSAL

## 2018-12-30 MED ORDER — SODIUM CHLORIDE 0.9 % IV SOLN
1.0000 g | INTRAVENOUS | Status: DC
  Administered 2018-12-30: 1 g via INTRAVENOUS
  Filled 2018-12-30: qty 1

## 2018-12-30 MED ORDER — IPRATROPIUM-ALBUTEROL 0.5-2.5 (3) MG/3ML IN SOLN: 3.0000 mL | Freq: Four times a day (QID) | RESPIRATORY_TRACT | Status: DC

## 2018-12-30 MED ORDER — GABAPENTIN 600 MG PO TABS
600.0000 mg | ORAL_TABLET | Freq: Four times a day (QID) | ORAL | Status: DC
  Administered 2018-12-30 – 2018-12-31 (×6): 600 mg via ORAL
  Filled 2018-12-30 (×9): qty 1

## 2018-12-30 MED ORDER — SODIUM CHLORIDE 0.9 % IV SOLN: 1.0000 g | INTRAVENOUS | Status: DC

## 2018-12-30 MED ORDER — METHYLPREDNISOLONE SODIUM SUCC 125 MG IJ SOLR
60.0000 mg | Freq: Four times a day (QID) | INTRAMUSCULAR | Status: AC
  Administered 2018-12-30 (×3): 60 mg via INTRAVENOUS
  Filled 2018-12-30 (×3): qty 2

## 2018-12-30 MED ORDER — BUDESONIDE 0.25 MG/2ML IN SUSP
0.2500 mg | Freq: Two times a day (BID) | RESPIRATORY_TRACT | Status: DC
  Administered 2018-12-30 – 2018-12-31 (×3): 0.25 mg via RESPIRATORY_TRACT
  Filled 2018-12-30 (×3): qty 2

## 2018-12-30 MED ORDER — ONDANSETRON HCL 4 MG/2ML IJ SOLN: 4.0000 mg | Freq: Four times a day (QID) | INTRAMUSCULAR | Status: DC | PRN

## 2018-12-30 MED ORDER — TRAZODONE HCL 50 MG PO TABS: 25.0000 mg | ORAL_TABLET | Freq: Every evening | ORAL | Status: DC | PRN

## 2018-12-30 MED ORDER — CHLORHEXIDINE GLUCONATE 0.12 % MT SOLN
15.0000 mL | Freq: Two times a day (BID) | OROMUCOSAL | Status: DC
  Administered 2018-12-30 – 2018-12-31 (×3): 15 mL via OROMUCOSAL
  Filled 2018-12-30 (×3): qty 15

## 2018-12-30 MED ORDER — IPRATROPIUM-ALBUTEROL 0.5-2.5 (3) MG/3ML IN SOLN
3.0000 mL | RESPIRATORY_TRACT | Status: DC
  Administered 2018-12-30 – 2018-12-31 (×8): 3 mL via RESPIRATORY_TRACT
  Filled 2018-12-30 (×9): qty 3

## 2018-12-30 MED ORDER — ENOXAPARIN SODIUM 40 MG/0.4ML ~~LOC~~ SOLN
40.0000 mg | SUBCUTANEOUS | Status: DC
  Administered 2018-12-31: 09:00:00 40 mg via SUBCUTANEOUS
  Filled 2018-12-30: qty 0.4

## 2018-12-30 MED ORDER — HYDROCODONE-ACETAMINOPHEN 5-325 MG PO TABS: 1.0000 | ORAL_TABLET | ORAL | Status: DC | PRN

## 2018-12-30 MED ORDER — LISINOPRIL 20 MG PO TABS
40.0000 mg | ORAL_TABLET | Freq: Every day | ORAL | Status: DC
  Administered 2018-12-30 – 2018-12-31 (×2): 40 mg via ORAL
  Filled 2018-12-30 (×2): qty 2

## 2018-12-30 MED ORDER — ACETAMINOPHEN 650 MG RE SUPP: 650.0000 mg | Freq: Four times a day (QID) | RECTAL | Status: DC | PRN

## 2018-12-30 MED ORDER — ACETAMINOPHEN 325 MG PO TABS: 650.0000 mg | ORAL_TABLET | Freq: Four times a day (QID) | ORAL | Status: DC | PRN

## 2018-12-30 MED ORDER — PANTOPRAZOLE SODIUM 40 MG PO TBEC
40.0000 mg | DELAYED_RELEASE_TABLET | Freq: Every day | ORAL | Status: DC
  Administered 2018-12-30 – 2018-12-31 (×2): 40 mg via ORAL
  Filled 2018-12-30 (×2): qty 1

## 2018-12-30 MED ORDER — PREDNISONE 20 MG PO TABS: 40.0000 mg | ORAL_TABLET | Freq: Every day | ORAL | Status: DC

## 2018-12-30 MED ORDER — ONDANSETRON HCL 4 MG PO TABS: 4.0000 mg | ORAL_TABLET | Freq: Four times a day (QID) | ORAL | Status: DC | PRN

## 2018-12-30 MED ORDER — LACTATED RINGERS IV SOLN
INTRAVENOUS | Status: DC
  Administered 2018-12-30: 09:00:00 via INTRAVENOUS

## 2018-12-30 MED ORDER — MAGNESIUM HYDROXIDE 400 MG/5ML PO SUSP: 30.0000 mL | Freq: Every day | ORAL | Status: DC | PRN

## 2018-12-30 NOTE — Progress Notes (Signed)
OT Cancellation Note  Patient Details Name: David Rhodes MRN: [id number] DOB: 1956/02/13   Cancelled Treatment:    Reason Eval/Treat Not Completed: Patient not medically ready. Order received, chart reviewed. Pt currently on BiPAP and inappropriate for participation in therapy at this time. Will continue to follow acutely and re-attempt OT evaluation at later date/time as pt is available and medically appropriate.  Jeni Salles, MPH, MS, OTR/L ascom [phone] 12/30/18, 8:01 AM

## 2018-12-30 NOTE — Progress Notes (Signed)
Initial Nutrition Assessment  RD working remotely.  DOCUMENTATION CODES:   Not applicable  INTERVENTION:  Provide Ensure Enlive po BID, each supplement provides 350 kcal and 20 grams of protein.  NUTRITION DIAGNOSIS:   Increased nutrient needs related to catabolic illness(COPD) as evidenced by estimated needs.  GOAL:   Patient will meet greater than or equal to 90% of their needs  MONITOR:   PO intake, Supplement acceptance, Labs, Weight trends, I & O's  REASON FOR ASSESSMENT:   Consult COPD Protocol  ASSESSMENT:   63 year old male with PMHx of HTN, anxiety, depression, GERD, bilateral inguinal hernia s/p repair in 2015, COPD, asthma admitted with COPD exacerbation.   Attempted to call patient's phone for nutrition/weight history but he was unable to answer. He is on a heart-healthy diet. He just came off BiPAP this morning per flowsheets so breakfast will likely be his first meal he is attempting here. Weights appear stable for the past year. Patient is at risk for malnutrition. He has increased calorie/protein needs in setting of catabolic nature of COPD. Will continue to monitor adequacy of intake. Skin is intact.  Medications reviewed and include: azithromycin, gabapentin, lisinopril, Solu-Medrol 40 mg daily, pantoprazole, ceftriaxone, LR at 75 mL/hr.  Labs reviewed: CBG 136.  NUTRITION - FOCUSED PHYSICAL EXAM:  Unable to complete at this time.  Diet Order:   Diet Order            Diet Heart Room service appropriate? Yes; Fluid consistency: Thin  Diet effective now             EDUCATION NEEDS:   No education needs have been identified at this time  Skin:  Skin Assessment: Reviewed RN Assessment  Last BM:  12/29/2018 per chart  Height:   Ht Readings from Last 1 Encounters:  12/30/18 6' (1.829 m)   Weight:   Wt Readings from Last 1 Encounters:  12/30/18 67.3 kg   Ideal Body Weight:  80.9 kg  BMI:  Body mass index is 20.12 kg/m.  Estimated  Nutritional Needs:   Kcal:  1900-2100  Protein:  95-105 grams  Fluid:  1.9-2.1 L/day  Willey Blade, MS, RD, LDN Office: [phone] Pager: [phone] After Hours/Weekend Pager: [phone]

## 2018-12-30 NOTE — Progress Notes (Signed)
eLink Physician-Brief Progress Note Patient Name: David Rhodes DOB: 07-Jul-1955 MRN: [id number]   Date of Service  12/30/2018  HPI/Events of Note  Pt admitted with acute on chronic respiratory failure secondary to acute exacerbation of COPD. I agree with treatment plan as outlined by Darel Hong NP  eICU Interventions  New Patient Evaluation completed        Frederik Pear 12/30/2018, 2:35 AM

## 2018-12-30 NOTE — Progress Notes (Addendum)
[facility]                                                                                                                                                                                  Patient Demographics   David Rhodes, is a 63 y.o. male, DOB - 1956-01-08, HYQ:[id number]  Admit date - 12/29/2018   Admitting Physician Hannah Beat, MD  Outpatient Primary MD for the patient is Reubin Milan, MD   LOS - 0  Subjective: Patient continues to be short of breath. Denies any chest pain   Review of Systems:   CONSTITUTIONAL: No documented fever. No fatigue, weakness. No weight gain, no weight loss.  EYES: No blurry or double vision.  ENT: No tinnitus. No postnasal drip. No redness of the oropharynx.  RESPIRATORY: No cough, no wheeze, no hemoptysis.  Positive dyspnea.  CARDIOVASCULAR: No chest pain. No orthopnea. No palpitations. No syncope.  GASTROINTESTINAL: No nausea, no vomiting or diarrhea. No abdominal pain. No melena or hematochezia.  GENITOURINARY: No dysuria or hematuria.  ENDOCRINE: No polyuria or nocturia. No heat or cold intolerance.  HEMATOLOGY: No anemia. No bruising. No bleeding.  INTEGUMENTARY: No rashes. No lesions.  MUSCULOSKELETAL: No arthritis. No swelling. No gout.  NEUROLOGIC: No numbness, tingling, or ataxia. No seizure-type activity.  PSYCHIATRIC: No anxiety. No insomnia. No ADD.    Vitals:   Vitals:   12/30/18 1200 12/30/18 1300 12/30/18 1400 12/30/18 1512  BP: 114/86 126/75 125/80   Pulse: 97 (!) 101 88   Resp: 19 (!) 26 (!) 23   Temp:      TempSrc:      SpO2:    97%  Weight:      Height:        Wt Readings from Last 3 Encounters:  12/30/18 67.3 kg  12/25/18 66.6 kg  11/16/18 68 kg     Intake/Output Summary (Last 24 hours) at 12/30/2018 1522 Last data filed at 12/30/2018 1417 Gross per 24 hour  Intake 1798.74 ml  Output 750 ml  Net 1048.74 ml    Physical Exam:   GENERAL: Patient with shortness  of breath with accessory muscle usage HEAD, EYES, EARS, NOSE AND THROAT: Atraumatic, normocephalic. Extraocular muscles are intact. Pupils equal and reactive to light. Sclerae anicteric. No conjunctival injection. No oro-pharyngeal erythema.  NECK: Supple. There is no jugular venous distention. No bruits, no lymphadenopathy, no thyromegaly.  HEART: Regular rate and rhythm,. No murmurs, no rubs, no clicks.  LUNGS: Diminished breath sounds bilaterally without any accessory muscle usage ABDOMEN: Soft, flat, nontender, nondistended. Has good bowel sounds. No hepatosplenomegaly appreciated.  EXTREMITIES: No evidence of any cyanosis,  clubbing, or peripheral edema.  +2 pedal and radial pulses bilaterally.  NEUROLOGIC: The patient is alert, awake, and oriented x3 with no focal motor or sensory deficits appreciated bilaterally.  SKIN: Moist and warm with no rashes appreciated.  Psych: Not anxious, depressed LN: No inguinal LN enlargement    Antibiotics   Anti-infectives (From admission, onward)   Start     Dose/Rate Route Frequency Ordered Stop   12/31/18 1000  azithromycin (ZITHROMAX) tablet 500 mg  Status:  Discontinued     500 mg Oral Daily 12/30/18 0833 12/30/18 1003   12/31/18 1000  cefTRIAXone (ROCEPHIN) 1 g in sodium chloride 0.9 % 100 mL IVPB  Status:  Discontinued     1 g 200 mL/hr over 30 Minutes Intravenous Every 24 hours 12/30/18 0833 12/30/18 1003   12/31/18 0100  azithromycin (ZITHROMAX) tablet 500 mg  Status:  Discontinued     500 mg Oral Daily 12/30/18 0050 12/30/18 0833   12/30/18 0700  cefTRIAXone (ROCEPHIN) 1 g in sodium chloride 0.9 % 100 mL IVPB  Status:  Discontinued     1 g 200 mL/hr over 30 Minutes Intravenous Every 24 hours 12/30/18 0056 12/30/18 0833   12/30/18 0100  azithromycin (ZITHROMAX) 500 mg in sodium chloride 0.9 % 250 mL IVPB     500 mg 250 mL/hr over 60 Minutes Intravenous Every 24 hours 12/30/18 0050 12/30/18 0500   12/30/18 0100  cefTRIAXone (ROCEPHIN) 1 g  in sodium chloride 0.9 % 100 mL IVPB  Status:  Discontinued     1 g 200 mL/hr over 30 Minutes Intravenous Every 24 hours 12/30/18 0050 12/30/18 0057   12/29/18 2315  vancomycin (VANCOCIN) 1,750 mg in sodium chloride 0.9 % 500 mL IVPB     1,750 mg 250 mL/hr over 120 Minutes Intravenous  Once 12/29/18 2307 12/30/18 0135   12/29/18 2300  vancomycin (VANCOCIN) IVPB 1000 mg/200 mL premix  Status:  Discontinued     1,000 mg 200 mL/hr over 60 Minutes Intravenous  Once 12/29/18 2250 12/29/18 2307   12/29/18 2300  ceFEPIme (MAXIPIME) 2 g in sodium chloride 0.9 % 100 mL IVPB     2 g 200 mL/hr over 30 Minutes Intravenous  Once 12/29/18 2250 12/30/18 0000      Medications   Scheduled Meds: . budesonide (PULMICORT) nebulizer solution  0.25 mg Nebulization BID  . chlorhexidine  15 mL Mouth Rinse BID  . Chlorhexidine Gluconate Cloth  6 each Topical Daily  . [START ON 12/31/2018] enoxaparin (LOVENOX) injection  40 mg Subcutaneous Q24H  . gabapentin  600 mg Oral QID  . guaiFENesin  600 mg Oral BID  . ipratropium-albuterol  3 mL Nebulization Q4H  . lisinopril  40 mg Oral Daily  . mouth rinse  15 mL Mouth Rinse q12n4p  . methylPREDNISolone (SOLU-MEDROL) injection  60 mg Intravenous Q6H   Followed by  . [START ON 12/31/2018] predniSONE  40 mg Oral Q breakfast  . pantoprazole  40 mg Oral Daily   Continuous Infusions: . lactated ringers 75 mL/hr at 12/30/18 0917   PRN Meds:.acetaminophen **OR** acetaminophen, HYDROcodone-acetaminophen, magnesium hydroxide, ondansetron **OR** ondansetron (ZOFRAN) IV, traZODone   Data Review:   Micro Results Recent Results (from the past 240 hour(s))  SARS Coronavirus 2 (CEPHEID- Performed in [place] hospital lab), Hosp Order     Status: None   Collection Time: 12/25/18  3:02 AM   Specimen: Nasopharyngeal Swab  Result Value Ref Range Status   SARS Coronavirus 2 NEGATIVE NEGATIVE Final  Comment: (NOTE) If result is NEGATIVE SARS-CoV-2 target nucleic acids  are NOT DETECTED. The SARS-CoV-2 RNA is generally detectable in upper and lower  respiratory specimens during the acute phase of infection. The lowest  concentration of SARS-CoV-2 viral copies this assay can detect is 250  copies / mL. A negative result does not preclude SARS-CoV-2 infection  and should not be used as the sole basis for treatment or other  patient management decisions.  A negative result may occur with  improper specimen collection / handling, submission of specimen other  than nasopharyngeal swab, presence of viral mutation(s) within the  areas targeted by this assay, and inadequate number of viral copies  (<250 copies / mL). A negative result must be combined with clinical  observations, patient history, and epidemiological information. If result is POSITIVE SARS-CoV-2 target nucleic acids are DETECTED. The SARS-CoV-2 RNA is generally detectable in upper and lower  respiratory specimens dur ing the acute phase of infection.  Positive  results are indicative of active infection with SARS-CoV-2.  Clinical  correlation with patient history and other diagnostic information is  necessary to determine patient infection status.  Positive results do  not rule out bacterial infection or co-infection with other viruses. If result is PRESUMPTIVE POSTIVE SARS-CoV-2 nucleic acids MAY BE PRESENT.   A presumptive positive result was obtained on the submitted specimen  and confirmed on repeat testing.  While 2019 novel coronavirus  (SARS-CoV-2) nucleic acids may be present in the submitted sample  additional confirmatory testing may be necessary for epidemiological  and / or clinical management purposes  to differentiate between  SARS-CoV-2 and other Sarbecovirus currently known to infect humans.  If clinically indicated additional testing with an alternate test  methodology [phone]) is advised. The SARS-CoV-2 RNA is generally  detectable in upper and lower respiratory sp ecimens  during the acute  phase of infection. The expected result is Negative. Fact Sheet for Patients:  BoilerBrush.com.cyhttps://www.fda.gov/media/136312/download Fact Sheet for Healthcare Providers: https://pope.com/https://www.fda.gov/media/136313/download This test is not yet approved or cleared by the [place] FDA and has been authorized for detection and/or diagnosis of SARS-CoV-2 by FDA under an Emergency Use Authorization (EUA).  This EUA will remain in effect (meaning this test can be used) for the duration of the COVID-19 declaration under Section 564(b)(1) of the Act, 21 U.S.C. section 360bbb-3(b)(1), unless the authorization is terminated or revoked sooner. Performed at [facility], [address] [place], [place] [address]   SARS Coronavirus 2 [facility](Hospital order, Performed in [facility] hospital lab)     Status: None   Collection Time: 12/25/18  3:40 AM  Result Value Ref Range Status   SARS Coronavirus 2 NEGATIVE NEGATIVE Final    Comment: (NOTE) If result is NEGATIVE SARS-CoV-2 target nucleic acids are NOT DETECTED. The SARS-CoV-2 RNA is generally detectable in upper and lower  respiratory specimens during the acute phase of infection. The lowest  concentration of SARS-CoV-2 viral copies this assay can detect is 250  copies / mL. A negative result does not preclude SARS-CoV-2 infection  and should not be used as the sole basis for treatment or other  patient management decisions.  A negative result may occur with  improper specimen collection / handling, submission of specimen other  than nasopharyngeal swab, presence of viral mutation(s) within the  areas targeted by this assay, and inadequate number of viral copies  (<250 copies / mL). A negative result must be combined with clinical  observations, patient history, and epidemiological information.  If result is POSITIVE SARS-CoV-2 target nucleic acids are DETECTED. The SARS-CoV-2 RNA is generally detectable in upper and lower  respiratory  specimens dur ing the acute phase of infection.  Positive  results are indicative of active infection with SARS-CoV-2.  Clinical  correlation with patient history and other diagnostic information is  necessary to determine patient infection status.  Positive results do  not rule out bacterial infection or co-infection with other viruses. If result is PRESUMPTIVE POSTIVE SARS-CoV-2 nucleic acids MAY BE PRESENT.   A presumptive positive result was obtained on the submitted specimen  and confirmed on repeat testing.  While 2019 novel coronavirus  (SARS-CoV-2) nucleic acids may be present in the submitted sample  additional confirmatory testing may be necessary for epidemiological  and / or clinical management purposes  to differentiate between  SARS-CoV-2 and other Sarbecovirus currently known to infect humans.  If clinically indicated additional testing with an alternate test  methodology [phone]) is advised. The SARS-CoV-2 RNA is generally  detectable in upper and lower respiratory sp ecimens during the acute  phase of infection. The expected result is Negative. Fact Sheet for Patients:  BoilerBrush.com.cy Fact Sheet for Healthcare Providers: https://pope.com/ This test is not yet approved or cleared by the [place] FDA and has been authorized for detection and/or diagnosis of SARS-CoV-2 by FDA under an Emergency Use Authorization (EUA).  This EUA will remain in effect (meaning this test can be used) for the duration of the COVID-19 declaration under Section 564(b)(1) of the Act, 21 U.S.C. section 360bbb-3(b)(1), unless the authorization is terminated or revoked sooner. Performed at [facility], [address] [place], [place] [address]   MRSA PCR Screening     Status: None   Collection Time: 12/25/18  6:22 AM   Specimen: Nasal Mucosa; Nasopharyngeal  Result Value Ref Range Status   MRSA by PCR NEGATIVE NEGATIVE  Final    Comment:        The GeneXpert MRSA Assay (FDA approved for NASAL specimens only), is one component of a comprehensive MRSA colonization surveillance program. It is not intended to diagnose MRSA infection nor to guide or monitor treatment for MRSA infections. Performed at [facility], [address] [place], [place] [address]   Blood Culture (routine x 2)     Status: None (Preliminary result)   Collection Time: 12/29/18  8:39 PM   Specimen: BLOOD  Result Value Ref Range Status   Specimen Description BLOOD RIGHT ANTECUBITAL  Final   Special Requests   Final    BOTTLES DRAWN AEROBIC AND ANAEROBIC Blood Culture results may not be optimal due to an excessive volume of blood received in culture bottles   Culture   Final    NO GROWTH < 12 HOURS Performed at [facility], [address], [place], [place] [address]    Report Status PENDING  Incomplete  Blood Culture (routine x 2)     Status: None (Preliminary result)   Collection Time: 12/29/18  8:39 PM   Specimen: BLOOD  Result Value Ref Range Status   Specimen Description BLOOD BLOOD LEFT HAND  Final   Special Requests   Final    BOTTLES DRAWN AEROBIC AND ANAEROBIC Blood Culture adequate volume   Culture   Final    NO GROWTH < 12 HOURS Performed at [facility], [address], [place], [place] [address]    Report Status PENDING  Incomplete  SARS Coronavirus 2 (CEPHEID- Performed in [facility] hospital lab),  Hosp Order     Status: None   Collection Time: 12/29/18  8:39 PM   Specimen: Nasopharyngeal Swab  Result Value Ref Range Status   SARS Coronavirus 2 NEGATIVE NEGATIVE Final    Comment: (NOTE) If result is NEGATIVE SARS-CoV-2 target nucleic acids are NOT DETECTED. The SARS-CoV-2 RNA is generally detectable in upper and lower  respiratory specimens during the acute phase of infection. The lowest  concentration of SARS-CoV-2 viral copies this assay can detect is 250  copies / mL. A  negative result does not preclude SARS-CoV-2 infection  and should not be used as the sole basis for treatment or other  patient management decisions.  A negative result may occur with  improper specimen collection / handling, submission of specimen other  than nasopharyngeal swab, presence of viral mutation(s) within the  areas targeted by this assay, and inadequate number of viral copies  (<250 copies / mL). A negative result must be combined with clinical  observations, patient history, and epidemiological information. If result is POSITIVE SARS-CoV-2 target nucleic acids are DETECTED. The SARS-CoV-2 RNA is generally detectable in upper and lower  respiratory specimens dur ing the acute phase of infection.  Positive  results are indicative of active infection with SARS-CoV-2.  Clinical  correlation with patient history and other diagnostic information is  necessary to determine patient infection status.  Positive results do  not rule out bacterial infection or co-infection with other viruses. If result is PRESUMPTIVE POSTIVE SARS-CoV-2 nucleic acids MAY BE PRESENT.   A presumptive positive result was obtained on the submitted specimen  and confirmed on repeat testing.  While 2019 novel coronavirus  (SARS-CoV-2) nucleic acids may be present in the submitted sample  additional confirmatory testing may be necessary for epidemiological  and / or clinical management purposes  to differentiate between  SARS-CoV-2 and other Sarbecovirus currently known to infect humans.  If clinically indicated additional testing with an alternate test  methodology [phone]) is advised. The SARS-CoV-2 RNA is generally  detectable in upper and lower respiratory sp ecimens during the acute  phase of infection. The expected result is Negative. Fact Sheet for Patients:  BoilerBrush.com.cy Fact Sheet for Healthcare Providers: https://pope.com/ This test is not  yet approved or cleared by the [place] FDA and has been authorized for detection and/or diagnosis of SARS-CoV-2 by FDA under an Emergency Use Authorization (EUA).  This EUA will remain in effect (meaning this test can be used) for the duration of the COVID-19 declaration under Section 564(b)(1) of the Act, 21 U.S.C. section 360bbb-3(b)(1), unless the authorization is terminated or revoked sooner. Performed at [facility], [address], [place], [place] [address]     Radiology Reports Dg Chest [place]resckow 1 View  Result Date: 12/29/2018 CLINICAL DATA:  Respiratory distress EXAM: PORTABLE CHEST 1 VIEW COMPARISON:  December 25, 2018 FINDINGS: The lungs are hyperexpanded. The cardiac silhouette is stable. There is no acute osseous abnormality. No focal infiltrate. No pneumothorax, however the right lung apex is suboptimally evaluated secondary to overlapping structures external to the patient. IMPRESSION: No acute cardiopulmonary process. Stable exam when compared to study dated December 25, 2018 Electronically Signed   By: Katherine Mantle M.D.   On: 12/29/2018 20:19   Dg Chest Portable 1 View  Result Date: 12/25/2018 CLINICAL DATA:  Shortness of breath. EXAM: PORTABLE CHEST 1 VIEW COMPARISON:  Oct 29, 2018 FINDINGS: The heart size is normal. There are prominent interstitial lung markings bilaterally which is stable from prior  studies. There is no pneumothorax. The lungs appear hyperexpanded. There is no focal area of consolidation. No large pleural effusion. IMPRESSION: Hyperexpanded lungs with stable prominent interstitial lung markings bilaterally. Electronically Signed   By: Katherine Mantlehristopher  Green M.D.   On: 12/25/2018 03:38     CBC Recent Labs  Lab 12/25/18 0302 12/29/18 2014 12/30/18 0213  WBC 11.9* 22.6* 16.0*  HGB 16.7 17.7* 15.3  HCT 50.4 53.1* 46.7  PLT 243 306 237  MCV 89.2 88.8 90.2  MCH 29.6 29.6 29.5  MCHC 33.1 33.3 32.8  RDW 12.8 12.8 12.6  LYMPHSABS 3.4 4.8*  --    MONOABS 0.8 1.4*  --   EOSABS 2.0* 2.4*  --   BASOSABS 0.2* 0.3*  --     Chemistries  Recent Labs  Lab 12/25/18 0302 12/29/18 2014 12/30/18 0213  NA 140 140 140  K 4.0 4.1 4.8  CL 105 106 109  CO2 28 24 24   GLUCOSE 111* 151* 164*  BUN 16 9 12   CREATININE 0.93 0.87 0.85  CALCIUM 10.8* 9.9 9.5  MG  --  2.3  --   AST 17 19  --   ALT 14 17  --   ALKPHOS 85 88  --   BILITOT 0.6 0.6  --    ------------------------------------------------------------------------------------------------------------------ estimated creatinine clearance is 84.7 mL/min (by C-G formula based on SCr of 0.85 mg/dL). ------------------------------------------------------------------------------------------------------------------ No results for input(s): HGBA1C in the last 72 hours. ------------------------------------------------------------------------------------------------------------------ No results for input(s): CHOL, HDL, LDLCALC, TRIG, CHOLHDL, LDLDIRECT in the last 72 hours. ------------------------------------------------------------------------------------------------------------------ No results for input(s): TSH, T4TOTAL, T3FREE, THYROIDAB in the last 72 hours.  Invalid input(s): FREET3 ------------------------------------------------------------------------------------------------------------------ No results for input(s): VITAMINB12, FOLATE, FERRITIN, TIBC, IRON, RETICCTPCT in the last 72 hours.  Coagulation profile No results for input(s): INR, PROTIME in the last 168 hours.  No results for input(s): DDIMER in the last 72 hours.  Cardiac Enzymes No results for input(s): CKMB, TROPONINI, MYOGLOBIN in the last 168 hours.  Invalid input(s): CK ------------------------------------------------------------------------------------------------------------------ Invalid input(s): POCBNP    Assessment & Plan   1.  Acute respiratory failure secondary to COPD acute exacerbation  requiring BiPAP.  Initially now on oxygen therapy Continue therapy for acute COPD exacerbation Patient ambulated and his oxygen sats did not drop he states that he needs oxygen at home.  I explained to him we will recheck and ambulate him again.   2.  Acute bronchitis.  This is likely the culprit for #1.  Management as above.  Pulmonary antibiotics have been discontinued  3.  Hypertension.  Continue Zestril.  4.  GERD.  Continue PPI therapy.  5.  DVT prophylaxis.  Subcutaneous Lovenox.      Code Status Orders  (From admission, onward)         Start     Ordered   12/30/18 0045  Full code  Continuous     12/30/18 0050        Code Status History    Date Active Date Inactive Code Status Order ID Comments User Context   12/25/2018 0612 12/25/2018 1301 Full Code [id number]  Arnaldo Nataliamond, Michael S, MD Inpatient   08/10/2018 1731 08/11/2018 1259 Full Code [id number]  Maeola HarmanStern, Joseph, MD Inpatient   04/06/2018 2005 04/08/2018 1946 Full Code [id number]  Maeola HarmanStern, Joseph, MD Inpatient   10/11/2017 2234 10/12/2017 1432 Full Code [id number]  Cammy CopaMaier, Angela, MD Inpatient   Advance Care Planning Activity           Consults pulmonary critical care  DVT  Prophylaxis  Lovenox  Lab Results  Component Value Date   PLT 237 12/30/2018     Time Spent in minutes 45 minutes of additional critical care time spent 1 PM to 1:45 PM  Greater than 50% of time spent in care coordination and counseling patient regarding the condition and plan of care.   Auburn BilberryShreyang Oakley Kossman M.D on 12/30/2018 at 3:22 PM  Between 7am to 6pm - Pager - [phone]  After 6pm go to [email or web address] - Social research officer, governmentpassword [email or web address]  [employer] [facility]   Office  [phone]

## 2018-12-30 NOTE — H&P (Addendum)
[facility] NAME: David Rhodes    MR#:  [id number]  DATE OF BIRTH:  July 20, 1955  DATE OF ADMISSION: 12/30/2018  PRIMARY CARE PHYSICIAN: Glean Hess, MD   REQUESTING/REFERRING PHYSICIAN: Merlyn Lot, MD CHIEF COMPLAINT:   Chief Complaint  Patient presents with  . Respiratory Distress    HISTORY OF PRESENT ILLNESS:  David Rhodes  is a 63 y.o. Caucasian male with a known history of COPD and hypertension, who presented to the emergency room with acute onset of worsening dyspnea with associated cough productive of whitish sputum as well as wheezing which have been worsening over the last week.  His symptoms have been actually going on over the last month and this is 1 of recurrent admissions over the last 3 weeks.  He stated that it he would have occasional chest pain only with cough.  No fever or chills.  No nausea vomiting or abdominal pain.  Upon presentation to the emergency room, his blood pressure was 126/94 and later 131/99 with respiratory to 24, pulse of 107 and he was satting 100% on 50 percent FiO2 on BiPAP with a temperature of 96.6.Marland Kitchen  Labs revealed lactic acid of 2.6 and leukocytosis of 16.  COVID-19 rapid test came back negative.  Portable chest x-ray revealed hyperexpanded lungs with no acute cardiopulmonary disease.  The patient was given IV cefepime, IV vancomycin and IV Solu-Medrol as well as 2 g of IV magnesium sulfate in addition to duo nebs.  He will be admitted to a stepdown unit for further evaluation and management. PAST MEDICAL HISTORY:   Past Medical History:  Diagnosis Date  . Allergy    Hay fever  . Anxiety   . Asthma   . Chronic pain due to trauma   . COPD (chronic obstructive pulmonary disease) ([facility])   . Depression   . Essential tremor   . GERD (gastroesophageal reflux disease)   . Headache(784.0)    neck pain  . Hypertension   . Inguinal hernia    bilateral  . Neuromuscular disorder  ([facility])    hx of tremors  . Pneumonia    many years ago  . Shortness of breath    when allergies flare up  . Urine incontinence   . Wears glasses     PAST SURGICAL HISTORY:   Past Surgical History:  Procedure Laterality Date  . DEEP BRAIN STIMULATOR PLACEMENT    . INGUINAL HERNIA REPAIR Bilateral 02/07/2014   Procedure:  BILATERAL INGUINAL HERNIA REPAIR;  Surgeon: Ralene Ok, MD;  Location: [facility];  Service: General;  Laterality: Bilateral;  . INSERTION OF MESH Bilateral 02/07/2014   Procedure: INSERTION OF MESH;  Surgeon: Ralene Ok, MD;  [facility];  Service: General;  Laterality: Bilateral;  . NASAL SINUS SURGERY    . PULSE GENERATOR IMPLANT Left 09/11/2016   Procedure: Left chest-Change implantable pulse generator battery;  Surgeon: Erline Levine, MD;  Location: [facility];  Service: Neurosurgery;  Laterality: Left;  Left chest-Change implantable pulse generator battery  . PULSE GENERATOR IMPLANT Left 04/06/2018   Procedure: Revision of left chest implantable pulse generator, extension, and pocket adapter;  Surgeon: Erline Levine, MD;  Location: [facility];  Service: Neurosurgery;  Laterality: Left;  . SUBTHALAMIC STIMULATOR BATTERY REPLACEMENT  12/09/2011   Procedure: SUBTHALAMIC STIMULATOR BATTERY REPLACEMENT;  Surgeon: Erline Levine, MD;  Location: [facility];  Service: Neurosurgery;  Laterality: N/A;   deep brain stimulator, implantable pulse generator change  .  SUBTHALAMIC STIMULATOR BATTERY REPLACEMENT Left 04/21/2014   Procedure: Deep brain stimulator battery change;  Surgeon: Maeola HarmanJoseph Stern, MD;  Location: [facility];  Service: Neurosurgery;  Laterality: Left;  Deep brain stimulator battery change  . SUBTHALAMIC STIMULATOR BATTERY REPLACEMENT N/A 08/10/2018   Procedure: Replacement of deep brain stimulator pulse generator/left abdomen with long lead extensions;  Surgeon: Maeola HarmanStern, Joseph, MD;  [facility];  Service: Neurosurgery;  Laterality: N/A;    SOCIAL HISTORY:    Social History   Tobacco Use  . Smoking status: Never Smoker  . Smokeless tobacco: Current User    Types: Snuff  Substance Use Topics  . Alcohol use: No    Alcohol/week: 0.0 standard drinks    FAMILY HISTORY:   Family History  Problem Relation Age of Onset  . Myoclonus Mother   . Breast cancer Mother   . Prostate cancer Father   . Heart disease Paternal Grandfather   . Myoclonus Maternal Uncle     DRUG ALLERGIES:   Allergies  Allergen Reactions  . Penicillins Shortness Of Breath    PATIENT HAS HAD A PCN REACTION WITH IMMEDIATE RASH, FACIAL/TONGUE/THROAT SWELLING, SOB, OR LIGHTHEADEDNESS WITH HYPOTENSION:  #  #  YES  #  #  Has patient had a PCN reaction causing severe rash involving mucus membranes or skin necrosis: No Has patient had a PCN reaction that required hospitalization No Has patient had a PCN reaction occurring within the last 10 years: No If all of the above answers are "NO", then may proceed with Cephalosporin use. **tolerated cefazolin and cephalexin 03/2017    REVIEW OF SYSTEMS:   ROS As per history of present illness. All pertinent systems were reviewed above. Constitutional,  HEENT, cardiovascular, respiratory, GI, GU, musculoskeletal, neuro, psychiatric, endocrine,  integumentary and hematologic systems were reviewed and are otherwise  negative/unremarkable except for positive findings mentioned above in the HPI.   MEDICATIONS AT HOME:   Prior to Admission medications   Medication Sig Start Date End Date Taking? Authorizing Provider  albuterol (PROVENTIL HFA;VENTOLIN HFA) 108 (90 Base) MCG/ACT inhaler INHALE 1 TO 2 PUFFS BY MOUTH EVERY 6 HOURS AS NEEDED FOR WHEEZING OR SHORTNESS OF BREATH Patient taking differently: Inhale 1-2 puffs into the lungs every 6 (six) hours as needed for wheezing or shortness of breath.  07/02/18   Reubin MilanBerglund, Laura H, MD  baclofen (LIORESAL) 10 MG tablet TAKE 1 TABLET(10 MG) BY MOUTH THREE TIMES DAILY Patient not taking:  Reported on 12/25/2018 12/05/18   Reubin MilanBerglund, Laura H, MD  BREO ELLIPTA 100-25 MCG/INH AEPB INHALE 1 PUFF INTO THE LUNGS DAILY Patient not taking: Reported on 12/25/2018 12/05/18   Reubin MilanBerglund, Laura H, MD  cephALEXin (KEFLEX) 500 MG capsule Take 1 capsule (500 mg total) by mouth 2 (two) times daily. Patient not taking: Reported on 12/25/2018 11/16/18   Ginnie SmartHatcher, Jeffrey C, MD  gabapentin (NEURONTIN) 600 MG tablet TAKE 1 TABLET(600 MG) BY MOUTH FOUR TIMES DAILY Patient taking differently: Take 600 mg by mouth 4 (four) times daily.  11/09/18   Reubin MilanBerglund, Laura H, MD  HYDROcodone-acetaminophen (NORCO/VICODIN) 5-325 MG tablet Take 1-2 tablets by mouth every 4 (four) hours as needed for severe pain ((score 7 to 10)). Patient not taking: Reported on 12/25/2018 08/11/18   Maeola HarmanStern, Joseph, MD  lisinopril (ZESTRIL) 40 MG tablet Take 1 tablet (40 mg total) by mouth daily. 12/05/18   Reubin MilanBerglund, Laura H, MD  loratadine (CLARITIN) 10 MG tablet Take 1 tablet (10 mg total) by mouth daily. Patient taking differently:  Take 10 mg by mouth daily as needed for allergies.  10/13/17   Enedina FinnerPatel, Sona, MD  meloxicam (MOBIC) 15 MG tablet TAKE 1 TABLET(15 MG) BY MOUTH DAILY Patient not taking: Reported on 12/25/2018 12/06/18   Reubin MilanBerglund, Laura H, MD  montelukast (SINGULAIR) 10 MG tablet TAKE 1 TABLET(10 MG) BY MOUTH AT BEDTIME Patient taking differently: Take 10 mg by mouth at bedtime.  12/05/18   Reubin MilanBerglund, Laura H, MD  omeprazole (PRILOSEC) 20 MG capsule TAKE 1 CAPSULE BY MOUTH DAILY Patient taking differently: Take 20 mg by mouth daily.  12/05/18   Reubin MilanBerglund, Laura H, MD  rOPINIRole (REQUIP) 2 MG tablet TAKE 1 TABLET BY MOUTH TWICE DAILY Patient taking differently: Take 2 mg by mouth 2 (two) times daily.  10/10/18   Reubin MilanBerglund, Laura H, MD  SPIRIVA HANDIHALER 18 MCG inhalation capsule INHALE CONTENTS OF 1 CAPSULE ONCE DAILY USING HANDIHALER Patient taking differently: Place 18 mcg into inhaler and inhale daily.  10/02/18   Reubin MilanBerglund, Laura H, MD       VITAL SIGNS:  Blood pressure 121/88, pulse (!) 101, temperature 99.1 F (37.3 C), temperature source Rectal, resp. rate (!) 24, height 6' (1.829 m), weight 72.6 kg, SpO2 99 %.  PHYSICAL EXAMINATION:  Physical Exam  GENERAL: Acutely ill 63 y.o.-year-old Caucasian patient lying in the bed with mild to moderate respiratory distress on BiPAP. EYES: Pupils equal, round, reactive to light and accommodation. No scleral icterus. Extraocular muscles intact.  HEENT: Head atraumatic, normocephalic. Oropharynx and nasopharynx clear.  NECK:  Supple, no jugular venous distention. No thyroid enlargement, no tenderness.  LUNGS: Normal breath sounds bilaterally, no wheezing, rales,rhonchi or crepitation. No use of accessory muscles of respiration.  CARDIOVASCULAR: Regular rate and rhythm, S1, S2 normal. No murmurs, rubs, or gallops.  ABDOMEN: Diffuse expiratory wheezes with slightly tight expiratory airflow and harsh vesicular breathing. EXTREMITIES: No pedal edema, cyanosis, or clubbing.  NEUROLOGIC: Cranial nerves II through XII are intact. Muscle strength 5/5 in all extremities. Sensation intact. Gait not checked.  PSYCHIATRIC: The patient is alert and oriented x 3.  Normal affect and good eye contact. SKIN: No obvious rash, lesion, or ulcer.   LABORATORY PANEL:   CBC Recent Labs  Lab 12/29/18 2014  WBC 22.6*  HGB 17.7*  HCT 53.1*  PLT 306   ------------------------------------------------------------------------------------------------------------------  Chemistries  Recent Labs  Lab 12/29/18 2014  NA 140  K 4.1  CL 106  CO2 24  GLUCOSE 151*  BUN 9  CREATININE 0.87  CALCIUM 9.9  MG 2.3  AST 19  ALT 17  ALKPHOS 88  BILITOT 0.6   ------------------------------------------------------------------------------------------------------------------  Cardiac Enzymes No results for input(s): TROPONINI in the last 168 hours.  ------------------------------------------------------------------------------------------------------------------  RADIOLOGY:  Dg Chest Port 1 View  Result Date: 12/29/2018 CLINICAL DATA:  Respiratory distress EXAM: PORTABLE CHEST 1 VIEW COMPARISON:  December 25, 2018 FINDINGS: The lungs are hyperexpanded. The cardiac silhouette is stable. There is no acute osseous abnormality. No focal infiltrate. No pneumothorax, however the right lung apex is suboptimally evaluated secondary to overlapping structures external to the patient. IMPRESSION: No acute cardiopulmonary process. Stable exam when compared to study dated December 25, 2018 Electronically Signed   By: Katherine Mantlehristopher  Green M.D.   On: 12/29/2018 20:19      IMPRESSION AND PLAN:   1.  Acute respiratory failure secondary to COPD acute exacerbation requiring BiPAP. The patient will be admitted to a stepdown bed and will be placed on IV steroid therapy with IV Solu-Medrol as well  as nebulized bronchodilator therapy with duonebs q.i.d. and q.4 hours p.r.n., mucolytic therapy with Mucinex and antibiotic therapy with IV Rocephin and Zithromax.  Sputum Gram stain culture and sensitivity will be obtained.  O2 protocol will be followed.  I will continue Spiriva but hold off his Breo.  I have notified the e- link intensivist about the patient.  The patient may need home health upon discharge to follow on his medications for the possibility of noncompliance contributing to his recurrent admissions.  2.  Acute bronchitis.  This is likely the culprit for #1.  Management as above.  3.  Hypertension.  Continue Zestril.  4.  GERD.  Continue PPI therapy.  5.  DVT prophylaxis.  Subcutaneous Lovenox.   All the records are reviewed and case discussed with ED provider. The plan of care was discussed in details with the patient (and family). I answered all questions. The patient agreed to proceed with the above mentioned plan. Further management will depend upon  hospital course.   CODE STATUS: Full code  TOTAL TIME TAKING in critical care  OF THIS PATIENT: 45 minutes.    Hannah BeatJan A  M.D on 12/30/2018 at 12:52 AM  Pager - [phone]  After 6pm go to [email or web address] - Social research officer, governmentpassword EPAS [facility]  [facility]  Office  [phone]  CC: Primary care physician; Reubin MilanBerglund, Laura H, MD   Note: This dictation was prepared with Dragon dictation along with smaller phrase technology. Any transcriptional errors that result from this process are unintentional.

## 2018-12-30 NOTE — ED Notes (Signed)
Pt used call bell to tell this RN that IV pump is going off. This RN at bedside silencing/fixing IV pump.

## 2018-12-30 NOTE — Progress Notes (Signed)
Comfortable at the moment on [place] Progress Note Patient Name: David Rhodes DOB: 1956/06/26 MRN: [id number]   Date of Service  12/30/2018  HPI/Events of Note  Comfortable at the moment on Bipap  eICU Interventions  Continue current Rx        Frederik Pear 12/30/2018, 5:45 AM

## 2018-12-30 NOTE — Evaluation (Signed)
Occupational Therapy Evaluation Patient Details Name: David Rhodes MRN: [id number] DOB: 1956-04-10 Today's Date: 12/30/2018    History of Present Illness 63 y.o. Caucasian male with a known history of COPD and hypertension, who presented to the emergency room with acute onset of worsening dyspnea with associated cough productive of whitish sputum.  Pt with multiple recent admits, most recently left AMA.   Clinical Impression   Pt seen for OT evaluation this date. Pt was independent in all ADLs and mobility, living in a 1 story home alone. Pt endorses difficulty with dressing and longer distances 2/2 SOB. No O2 use at home. Pt reports becoming easily fatigued or out of breath with minimize exertion. Pt's VSS throughout session, remained on 2L O2 2/2 pt reporting SOB during session. Pt encouraged to utilize pursed lip breathing but pt refused, stating he felt better breathing through his mouth despite the nasal cannula and supplemental oxygen. Pt educated in energy conservation conservation strategies including pursed lip breathing, activity pacing, home/routines modifications, work simplification, AE/DME, prioritizing of meaningful occupations, and falls prevention. Handout provided. Pt verbalized understanding but provided various examples and explanations for why various strategies or AE would not help him because it may increase the time needed to perform a task. Pt demonstrates impairments in activity tolerance and perceived SOB during ADL and mobility tasks. Pt also endorses difficulty affording medications and food. He reports he often is not hungry so he won't eat. Pt  would benefit from additional skilled OT services to maximize recall and carryover of learned techniques and facilitate implementation of learned techniques into daily routines. Upon discharge, recommend [facility] services. RNCM notified of food/med insecurities.     Follow Up Recommendations  Home health OT    Equipment  Recommendations  None recommended by OT    Recommendations for Other Services       Precautions / Restrictions Precautions Precautions: None Restrictions Weight Bearing Restrictions: No      Mobility Bed Mobility Overal bed mobility: Independent             General bed mobility comments: Pt able to get himself to sitting EOB and back to supine w/o assist, minimal extra time  Transfers Overall transfer level: Modified independent Equipment used: None             General transfer comment: Pt able to rise to standing w/o assist and showed good confidence and no LOBs    Balance Overall balance assessment: Modified Independent                                         ADL either performed or assessed with clinical judgement   ADL Overall ADL's : Modified independent                                       General ADL Comments: Pt near baseline, requiring additional time/effort to perform ADL tasks, increased SOB per pt report     Vision Baseline Vision/History: Wears glasses Wears Glasses: At all times Patient Visual Report: No change from baseline       Perception     Praxis      Pertinent Vitals/Pain Pain Assessment: No/denies pain Pain Score: 4      Hand Dominance Right   Extremity/Trunk Assessment Upper Extremity Assessment Upper Extremity  Assessment: Overall WFL for tasks assessed   Lower Extremity Assessment Lower Extremity Assessment: Overall WFL for tasks assessed   Cervical / Trunk Assessment Cervical / Trunk Assessment: Normal   Communication Communication Communication: No difficulties(some dyspnea with prolonged conversation)   Cognition Arousal/Alertness: Awake/alert Behavior During Therapy: WFL for tasks assessed/performed Overall Cognitive Status: Within Functional Limits for tasks assessed                                     General Comments  VSS throughout session, pt reporting  SOB after talking with therapist but declines to attempt pursed lip breathing, instead reporting that he felt better breathing through his mouth    Exercises Other Exercises Other Exercises: Pt educated in energy conservation principles and how to implement into daily routines to minimize SOB and support independence. Handout provided. With each recommendation and suggestion, pt seemed to have a reason why that would be ineffective and would take "too much time". Pt concludes that if he can't do something like he's always done it, then he needs help to do it.   Shoulder Instructions      Home Living Family/patient expects to be discharged to:: Private residence Living Arrangements: Alone Available Help at Discharge: Friend(s);Available PRN/intermittently(plans on going to girlfriends home on discharge)   Home Access: Stairs to enter Entergy CorporationEntrance Stairs-Number of Steps: 5("just a few" at girlfriend's home) Entrance Stairs-Rails: Right Home Layout: One level     Bathroom Shower/Tub: Chief Strategy OfficerTub/shower unit   Bathroom Toilet: Standard     Home Equipment: None   Additional Comments: pt reports he has no help and lives alone. Pt reports he can go home with girlfriend at discharge if it helps get him discharged sooner      Prior Functioning/Environment Level of Independence: Independent        Comments: Pt reports that he has not been out a lot recently, but normally can do everything he needs w/o assist. Pt does endorse difficulty with dressing 2/2 SOB. And endorses difficulty affording food and medication        OT Problem List: Decreased activity tolerance      OT Treatment/Interventions: Self-care/ADL training;Therapeutic activities;Therapeutic exercise;Energy conservation;DME and/or AE instruction;Patient/family education    OT Goals(Current goals can be found in the care plan section) Acute Rehab OT Goals Patient Stated Goal: get supplemental oxygen so he can return home OT Goal  Formulation: With patient Time For Goal Achievement: 01/13/19 Potential to Achieve Goals: Good ADL Goals Additional ADL Goal #1: Pt will demonstrate modified independence with morning ADL routine while maintaining O2 sats >95% on RA throughout. Additional ADL Goal #2: Pt will verbalize plan to implement 1 learned energy conservation strategy to maximize safety/indep and minimize SOB.  OT Frequency: Min 1X/week   Barriers to D/C:            Co-evaluation              AM-PAC OT "6 Clicks" Daily Activity     Outcome Measure Help from another person eating meals?: None Help from another person taking care of personal grooming?: None Help from another person toileting, which includes using toliet, bedpan, or urinal?: A Little Help from another person bathing (including washing, rinsing, drying)?: A Little Help from another person to put on and taking off regular upper body clothing?: None Help from another person to put on and taking off regular lower body clothing?: A  Little 6 Click Score: 21   End of Session    Activity Tolerance: Patient tolerated treatment well Patient left: in bed;with call bell/phone within reach;with bed alarm set  OT Visit Diagnosis: Other abnormalities of gait and mobility (R26.89)                Time: 1544-1620 OT Time [id number] (min): 36 min Charges:  OT General Charges $OT Visit: 1 Visit OT Evaluation $OT Eval Low Complexity: 1 Low OT Treatments $Self Care/Home Management : 23-37 mins  Richrd PrimeJamie Stiller, MPH, MS, OTR/L ascom [phone] 12/30/18, 4:42 PM

## 2018-12-30 NOTE — ED Notes (Signed)
Pt keeps moving arm while BP cuff going off. Cannot get accurate pressure.

## 2018-12-30 NOTE — Evaluation (Signed)
Physical Therapy Evaluation Patient Details Name: David Rhodes MRN: [id number] DOB: 10-13-1955 Today's Date: 12/30/2018   History of Present Illness  63 y.o. Caucasian male with a known history of COPD and hypertension, who presented to the emergency room with acute onset of worsening dyspnea with associated cough productive of whitish sputum.  Pt with multiple recent admits, most recently left AMA.  Clinical Impression  Pt anxious and short of breath t/o the PT session, sats at 99% on arrival (on 2L) and remained >97% the entire session on room air, even with ~80 ft of ambulation out of room w/o supplemental O2. He showed good effort but had some fatigue and definite dyspnea that was associated with just about anything he did, but did not seem to have O2 saturation drops regardless of the activity he was doing.  Pt with good strength and safety, he is not at his baseline and would benefit from HHPT, but once medically cleared he should be able to return home from PT stand-point.     Follow Up Recommendations Home health PT    Equipment Recommendations  None recommended by PT    Recommendations for Other Services       Precautions / Restrictions Precautions Precautions: None Restrictions Weight Bearing Restrictions: No      Mobility  Bed Mobility Overal bed mobility: Independent             General bed mobility comments: Pt able to get himself to sitting EOB and back to supine w/o assist, minimal extra time  Transfers Overall transfer level: Modified independent Equipment used: None             General transfer comment: Pt able to rise to standing w/o assist and showed good confidence and no LOBs  Ambulation/Gait Ambulation/Gait assistance: Supervision;Min guard Gait Distance (Feet): 80 Feet Assistive device: None       General Gait Details: Pt on room air t/o the ambulation effort and sats stayed 98% or higher the entire time.  He did have increased shortness  of breath and reported subjective fatigue but overall did well and showed good safety.  HR did increase from ~100 at rest to ~120 at end of ambulation.  Stairs            Wheelchair Mobility    Modified Rankin (Stroke Patients Only)       Balance Overall balance assessment: Modified Independent                                           Pertinent Vitals/Pain Pain Assessment: 0-10 Pain Score: 4     Home Living Family/patient expects to be discharged to:: Private residence Living Arrangements: Alone Available Help at Discharge: Friend(s);Available PRN/intermittently(plans on going to girlfriends home on discharge)   Home Access: Stairs to enter Entrance Stairs-Rails: Right Entrance Stairs-Number of Steps: 5("just a few" at girlfriend's home)          Prior Function Level of Independence: Independent         Comments: Pt reports that he has not been out a lot recently, but normally can do everything he needs w/o assist     Hand Dominance        Extremity/Trunk Assessment   Upper Extremity Assessment Upper Extremity Assessment: Overall WFL for tasks assessed    Lower Extremity Assessment Lower Extremity Assessment: Overall WFL for tasks assessed  Communication   Communication: No difficulties(some dyspnea with prolonged conversation)  Cognition Arousal/Alertness: Awake/alert Behavior During Therapy: WFL for tasks assessed/performed Overall Cognitive Status: Within Functional Limits for tasks assessed                                        General Comments      Exercises     Assessment/Plan    PT Assessment Patient needs continued PT services  PT Problem List Decreased activity tolerance;Decreased strength;Decreased balance;Decreased mobility;Decreased knowledge of use of DME;Decreased safety awareness;Decreased knowledge of precautions;Cardiopulmonary status limiting activity;Pain       PT Treatment  Interventions DME instruction;Stair training;Gait training;Functional mobility training;Therapeutic activities;Balance training;Therapeutic exercise;Neuromuscular re-education;Patient/family education    PT Goals (Current goals can be found in the Care Plan section)  Acute Rehab PT Goals Patient Stated Goal: get breathing better, apparently feels he needs O2 despite high sats PT Goal Formulation: With patient Time For Goal Achievement: 01/13/19 Potential to Achieve Goals: Good    Frequency Min 2X/week   Barriers to discharge        Co-evaluation               AM-PAC PT "6 Clicks" Mobility  Outcome Measure Help needed turning from your back to your side while in a flat bed without using bedrails?: None Help needed moving from lying on your back to sitting on the side of a flat bed without using bedrails?: None Help needed moving to and from a bed to a chair (including a wheelchair)?: None Help needed standing up from a chair using your arms (e.g., wheelchair or bedside chair)?: None Help needed to walk in hospital room?: A Little Help needed climbing 3-5 steps with a railing? : A Little 6 Click Score: 22    End of Session Equipment Utilized During Treatment: Gait belt Activity Tolerance: Patient limited by fatigue;Patient tolerated treatment well Patient left: in bed;with call bell/phone within reach Nurse Communication: Mobility status PT Visit Diagnosis: Muscle weakness (generalized) (M62.81);Difficulty in walking, not elsewhere classified (R26.2)    Time: [id number] PT Time Calculation (min) (ACUTE ONLY): 35 min   Charges:   PT Evaluation $PT Eval Low Complexity: 1 Low PT Treatments $Gait Training: 8-22 mins        Malachi ProGalen R Jihan Rudy, DPT 12/30/2018, 2:50 PM

## 2018-12-30 NOTE — Consult Note (Signed)
Name: David Rhodes MRN: [id number] DOB: 11-20-1955    ADMISSION DATE:  12/29/2018 CONSULTATION DATE:  12/30/2018  REFERRING MD :  Dr. Sidney Ace  CHIEF COMPLAINT:  Acute Respiratory Distress  BRIEF PATIENT DESCRIPTION:  63 year old male with past medical history notable for COPD and asthma admitted with acute hypoxic hypercapnic respiratory failure in the setting of COPD exacerbation requiring BiPAP.  SIGNIFICANT EVENTS  7/2>> admission to stepdown unit  STUDIES:  N/A  CULTURES: SARS-CoV-2 PCR 7/1>> negative Blood x2 7/1>> Sputum 7/2>>  ANTIBIOTICS: Cefepime 7/1 x 1 dose Vancomycin 7/1 x 1 dose Azithromycin 7/2>> Rocephin 7/2>>  HISTORY OF PRESENT ILLNESS:   David Rhodes is a 63 year old male with a past medical history notable for COPD, asthma, anxiety, chronic pain, depression, hypertension who presents to [facility] ED on 12/29/2018 with complaints of acute respiratory distress.  Upon EMS arrival he was found to be hypoxic in the tripod position, administering himself home nebulizer's, and was placed on CPAP by EMS.  Upon arrival to the ED he continued to have respiratory distress, he was therefore he was subsequently placed on BiPAP.  He received 125 mg Solu-Medrol, duo nebs, and 2 g IV magnesium.  Venous blood gas revealed pH 7.18/CO2 83/O2 <31/bicarb 31.  Initial work-up reveals WBC 22.6, lactic acid 1.9, procalcitonin less than 0.1, high-sensitivity troponin 4.0.  Chest x-ray concerning for hyperexpansion, but no infiltrate or edema.  His COVID 19 PCR is negative.  Follow-up venous blood gas with noted improvement, pH 7.21/CO2 73/O2 <31/bicarb 29.2.  He is being admitted to stepdown unit for further work-up and treatment of acute hypoxic hypercapnic respiratory failure in the setting of COPD exacerbation requiring BiPAP.  PCCM is consulted for further management.  Of note he was admitted recently on 12/25/2018 with acute respiratory failure secondary to COPD exacerbation, however he he  ended up leaving [place] shortly after admission.  PAST MEDICAL HISTORY :   has a past medical history of Allergy, Anxiety, Asthma, Chronic pain due to trauma, COPD (chronic obstructive pulmonary disease) ([place]), Depression, Essential tremor, GERD (gastroesophageal reflux disease), Headache(784.0), Hypertension, Inguinal hernia, Neuromuscular disorder ([place]), Pneumonia, Shortness of breath, Urine incontinence, and Wears glasses.  has a past surgical history that includes Nasal sinus surgery; Deep brain stimulator placement; Subthalamic stimulator battery replacement (12/09/2011); Inguinal hernia repair (Bilateral, 02/07/2014); Insertion of mesh (Bilateral, 02/07/2014); Subthalamic stimulator battery replacement (Left, 04/21/2014); Pulse generator implant (Left, 09/11/2016); Pulse generator implant (Left, 04/06/2018); and Subthalamic stimulator battery replacement (N/A, 08/10/2018). Prior to Admission medications   Medication Sig Start Date End Date Taking? Authorizing Provider  albuterol (PROVENTIL HFA;VENTOLIN HFA) 108 (90 Base) MCG/ACT inhaler INHALE 1 TO 2 PUFFS BY MOUTH EVERY 6 HOURS AS NEEDED FOR WHEEZING OR SHORTNESS OF BREATH Patient taking differently: Inhale 1-2 puffs into the lungs every 6 (six) hours as needed for wheezing or shortness of breath.  07/02/18   Glean Hess, MD  baclofen (LIORESAL) 10 MG tablet TAKE 1 TABLET(10 MG) BY MOUTH THREE TIMES DAILY Patient not taking: Reported on 12/25/2018 12/05/18   Glean Hess, MD  BREO ELLIPTA 100-25 MCG/INH AEPB INHALE 1 PUFF INTO THE LUNGS DAILY Patient not taking: Reported on 12/25/2018 12/05/18   Glean Hess, MD  cephALEXin (KEFLEX) 500 MG capsule Take 1 capsule (500 mg total) by mouth 2 (two) times daily. Patient not taking: Reported on 12/25/2018 11/16/18   Campbell Riches, MD  gabapentin (NEURONTIN) 600 MG tablet TAKE 1 TABLET(600 MG) BY MOUTH FOUR TIMES DAILY  Patient taking differently: Take 600 mg by mouth 4 (four)  times daily.  11/09/18   Reubin MilanBerglund, Laura H, MD  HYDROcodone-acetaminophen (NORCO/VICODIN) 5-325 MG tablet Take 1-2 tablets by mouth every 4 (four) hours as needed for severe pain ((score 7 to 10)). Patient not taking: Reported on 12/25/2018 08/11/18   Maeola HarmanStern, Joseph, MD  lisinopril (ZESTRIL) 40 MG tablet Take 1 tablet (40 mg total) by mouth daily. 12/05/18   Reubin MilanBerglund, Laura H, MD  loratadine (CLARITIN) 10 MG tablet Take 1 tablet (10 mg total) by mouth daily. Patient taking differently: Take 10 mg by mouth daily as needed for allergies.  10/13/17   Enedina FinnerPatel, Sona, MD  meloxicam (MOBIC) 15 MG tablet TAKE 1 TABLET(15 MG) BY MOUTH DAILY Patient not taking: Reported on 12/25/2018 12/06/18   Reubin MilanBerglund, Laura H, MD  montelukast (SINGULAIR) 10 MG tablet TAKE 1 TABLET(10 MG) BY MOUTH AT BEDTIME Patient taking differently: Take 10 mg by mouth at bedtime.  12/05/18   Reubin MilanBerglund, Laura H, MD  omeprazole (PRILOSEC) 20 MG capsule TAKE 1 CAPSULE BY MOUTH DAILY Patient taking differently: Take 20 mg by mouth daily.  12/05/18   Reubin MilanBerglund, Laura H, MD  rOPINIRole (REQUIP) 2 MG tablet TAKE 1 TABLET BY MOUTH TWICE DAILY Patient taking differently: Take 2 mg by mouth 2 (two) times daily.  10/10/18   Reubin MilanBerglund, Laura H, MD  SPIRIVA HANDIHALER 18 MCG inhalation capsule INHALE CONTENTS OF 1 CAPSULE ONCE DAILY USING HANDIHALER Patient taking differently: Place 18 mcg into inhaler and inhale daily.  10/02/18   Reubin MilanBerglund, Laura H, MD   Allergies  Allergen Reactions  . Penicillins Shortness Of Breath    PATIENT HAS HAD A PCN REACTION WITH IMMEDIATE RASH, FACIAL/TONGUE/THROAT SWELLING, SOB, OR LIGHTHEADEDNESS WITH HYPOTENSION:  #  #  YES  #  #  Has patient had a PCN reaction causing severe rash involving mucus membranes or skin necrosis: No Has patient had a PCN reaction that required hospitalization No Has patient had a PCN reaction occurring within the last 10 years: No If all of the above answers are "NO", then may proceed with Cephalosporin  use. **tolerated cefazolin and cephalexin 03/2017    FAMILY HISTORY:  family history includes Breast cancer in his mother; Heart disease in his paternal grandfather; Myoclonus in his maternal uncle and mother; Prostate cancer in his father. SOCIAL HISTORY:  reports that he has never smoked. His smokeless tobacco use includes snuff. He reports that he does not drink alcohol or use drugs.   COVID-19 DISASTER DECLARATION:  FULL CONTACT PHYSICAL EXAMINATION WAS NOT POSSIBLE DUE TO TREATMENT OF COVID-19 AND  CONSERVATION OF PERSONAL PROTECTIVE EQUIPMENT, LIMITED EXAM FINDINGS INCLUDE-  Patient assessed or the symptoms described in the history of present illness.  In the context of the Global COVID-19 pandemic, which necessitated consideration that the patient might be at risk for infection with the SARS-CoV-2 virus that causes COVID-19, Institutional protocols and algorithms that pertain to the evaluation of patients at risk for COVID-19 are in a state of rapid change based on information released by regulatory bodies including the CDC and federal and state organizations. These policies and algorithms were followed during the patient's care while in hospital.  REVIEW OF SYSTEMS:  Positives in BOLD Constitutional: Negative for fever, chills, weight loss, malaise/fatigue and diaphoresis.  HENT: Negative for hearing loss, ear pain, nosebleeds, congestion, sore throat, neck pain, tinnitus and ear discharge.   Eyes: Negative for blurred vision, double vision, photophobia, pain, discharge and redness.  Respiratory:  Negative for +cough, hemoptysis, sputum production, +shortness of breath, +wheezing and stridor.   Cardiovascular: Negative for chest pain, palpitations, orthopnea, claudication, leg swelling and PND.  Gastrointestinal: Negative for heartburn, nausea, vomiting, abdominal pain, diarrhea, constipation, blood in stool and melena.  Genitourinary: Negative for dysuria, urgency, frequency,  hematuria and flank pain.  Musculoskeletal: Negative for myalgias, back pain, joint pain and falls.  Skin: Negative for itching and rash.  Neurological: Negative for dizziness, tingling, tremors, sensory change, speech change, focal weakness, seizures, loss of consciousness, weakness and headaches.  Endo/Heme/Allergies: Negative for environmental allergies and polydipsia. Does not bruise/bleed easily.  SUBJECTIVE:  Pt reports that his SOB has improved since coming to ED, but it "still has a long way to go" He reports wheezing and intermittent cough productive of white sputum He denies chest pain, fever, chills, edema, or sick contacts  VITAL SIGNS: Temp:  [99.1 F (37.3 C)] 99.1 F (37.3 C) (07/01 2056) Pulse Rate:  [94-136] 101 (07/01 2335) Resp:  [21-36] 24 (07/01 2335) BP: (97-214)/(71-157) 121/88 (07/01 2332) SpO2:  [96 %-100 %] 99 % (07/01 2335) Weight:  [72.6 kg] 72.6 kg (07/01 2222)  PHYSICAL EXAMINATION: General: Acutely ill-appearing male, sitting in bed, on BiPAP, with mild respiratory distress Neuro: Awake, alert and oriented x4, follows commands, no focal deficits, speech clear, pupils PERRLA HEENT: Atraumatic, normocephalic, neck supple, no JVD Cardiovascular: Regular rate and rhythm, S1-S2, no murmurs rubs or gallops, 2+ pulses throughout Lungs: Inspiratory and expiratory wheezes throughout on auscultation, BiPAP assisted, even, tachypnea, mild accessory muscle use Abdomen: Soft, nontender, nondistended, no guarding or rebound tenderness, bowel sounds positive x4 Musculoskeletal: No deformities, normal bulk and tone, no edema Skin: Warm and dry, no obvious rashes, lesions, or ulcerations  Recent Labs  Lab 12/25/18 0302 12/29/18 2014  NA 140 140  K 4.0 4.1  CL 105 106  CO2 28 24  BUN 16 9  CREATININE 0.93 0.87  GLUCOSE 111* 151*   Recent Labs  Lab 12/25/18 0302 12/29/18 2014  HGB 16.7 17.7*  HCT 50.4 53.1*  WBC 11.9* 22.6*  PLT 243 306   Dg Chest Port  1 View  Result Date: 12/29/2018 CLINICAL DATA:  Respiratory distress EXAM: PORTABLE CHEST 1 VIEW COMPARISON:  December 25, 2018 FINDINGS: The lungs are hyperexpanded. The cardiac silhouette is stable. There is no acute osseous abnormality. No focal infiltrate. No pneumothorax, however the right lung apex is suboptimally evaluated secondary to overlapping structures external to the patient. IMPRESSION: No acute cardiopulmonary process. Stable exam when compared to study dated December 25, 2018 Electronically Signed   By: Katherine Mantlehristopher  Green M.D.   On: 12/29/2018 20:19    ASSESSMENT / PLAN:  Acute Hypoxic Hypercapnic Respiratory Failure in the setting of COPD Exacerbation Hx: COPD, asthma -Supplemental O2 as needed to maintain O2 sats 88 to 94% -BiPAP, wean as tolerated -Follow intermittent chest x-ray and ABG as needed -Bronchodilators -Budesonide nebs -IV steroids -Continue Azithromycin & Rocephin  Mildly elevated troponin, likely in setting of demand ischemia Hx: Hypertension -Cardiac monitoring -Maintain MAP greater than 65 -Trend troponin  Leukocytosis -Monitor fever curve -Trend WBCs -Procalcitonin is negative -Follow cultures as above -Chest x-ray without infiltrate -Received cefepime and vancomycin x1 dose in ED -Continue Azithromycin & Rocephin for COPD Exacerbation       Disposition: Stepdown Goals of care: Full code VTE prophylaxis: Lovenox subcu Updates: Updated patient at bedside 12/30/2018  Harlon DittyJeremiah Johara Lodwick, [facility] LLCGACNP-BC [facility] Pager: [phone] Cell: [phone]  12/30/2018, 1:02  AM

## 2018-12-31 ENCOUNTER — Inpatient Hospital Stay: Payer: Medicare Other

## 2018-12-31 LAB — CBC WITH DIFFERENTIAL/PLATELET
Abs Immature Granulocytes: 0.21 10*3/uL — ABNORMAL HIGH (ref 0.00–0.07)
Basophils Absolute: 0.1 10*3/uL (ref 0.0–0.1)
Basophils Relative: 0 %
Eosinophils Absolute: 0 10*3/uL (ref 0.0–0.5)
Eosinophils Relative: 0 %
HCT: 41.2 % (ref 39.0–52.0)
Hemoglobin: 13.5 g/dL (ref 13.0–17.0)
Immature Granulocytes: 1 %
Lymphocytes Relative: 5 %
Lymphs Abs: 1.3 10*3/uL (ref 0.7–4.0)
MCH: 29.5 pg (ref 26.0–34.0)
MCHC: 32.8 g/dL (ref 30.0–36.0)
MCV: 90 fL (ref 80.0–100.0)
Monocytes Absolute: 1 10*3/uL (ref 0.1–1.0)
Monocytes Relative: 3 %
Neutro Abs: 27.4 10*3/uL — ABNORMAL HIGH (ref 1.7–7.7)
Neutrophils Relative %: 91 %
Platelets: 211 10*3/uL (ref 150–400)
RBC: 4.58 MIL/uL (ref 4.22–5.81)
RDW: 13 % (ref 11.5–15.5)
Smear Review: NORMAL
WBC: 30 10*3/uL — ABNORMAL HIGH (ref 4.0–10.5)
nRBC: 0 % (ref 0.0–0.2)

## 2018-12-31 LAB — BASIC METABOLIC PANEL
Anion gap: 5 (ref 5–15)
BUN: 19 mg/dL (ref 8–23)
CO2: 25 mmol/L (ref 22–32)
Calcium: 10.1 mg/dL (ref 8.9–10.3)
Chloride: 109 mmol/L (ref 98–111)
Creatinine, Ser: 0.82 mg/dL (ref 0.61–1.24)
GFR calc Af Amer: >60 mL/min (ref >60)
GFR calc non Af Amer: >60 mL/min (ref >60)
Glucose, Bld: 144 mg/dL — ABNORMAL HIGH (ref 70–99)
Potassium: 4.7 mmol/L (ref 3.5–5.1)
Sodium: 139 mmol/L (ref 135–145)

## 2018-12-31 MED ORDER — AZITHROMYCIN 500 MG PO TABS
500.0000 mg | ORAL_TABLET | Freq: Every day | ORAL | Status: DC
  Filled 2018-12-31: qty 1

## 2018-12-31 NOTE — Progress Notes (Signed)
Pt alert and oriented stating he wants to leave [facility].  I explained to David Rhodes due to acute on chronic respiratory failure and the fact he has NOTcompleted current treatment plan he is at [facility] for sudden death and/or cardiac arrest.  He states he understands the risk, however he's stating he is still going to leave AMA.  Marda Stalker, [place] Pager [phone] (please enter 7 digits) PCCM Consult Pager [phone] (please enter 7 digits)

## 2018-12-31 NOTE — Progress Notes (Signed)
CRITICAL CARE PROGRESS NOTE       SUBJECTIVE FINDINGS & SIGNIFICANT EVENTS   Patient is clinically improved, now off BIPAP.   Will speak to [employer] [facility] personnel to qualify for O2   Will need pulm eval on op baisis- appt made at [facility]  PAST MEDICAL HISTORY   Past Medical History:  Diagnosis Date  . Allergy    Hay fever  . Anxiety   . Asthma   . Chronic pain due to trauma   . COPD (chronic obstructive pulmonary disease) (HCC)   . Depression   . Essential tremor   . GERD (gastroesophageal reflux disease)   . Headache(784.0)    neck pain  . Hypertension   . Inguinal hernia    bilateral  . Neuromuscular disorder (HCC)    hx of tremors  . Pneumonia    many years ago  . Shortness of breath    when allergies flare up  . Urine incontinence   . Wears glasses      SURGICAL HISTORY   Past Surgical History:  Procedure Laterality Date  . DEEP BRAIN STIMULATOR PLACEMENT    . INGUINAL HERNIA REPAIR Bilateral 02/07/2014   Procedure:  BILATERAL INGUINAL HERNIA REPAIR;  Surgeon: Axel FillerArmando Ramirez, MD;  Location: [facility] OR;  Service: General;  Laterality: Bilateral;  . INSERTION OF MESH Bilateral 02/07/2014   Procedure: INSERTION OF MESH;  Surgeon: Axel FillerArmando Ramirez, MD;  Location: [facility] OR;  Service: General;  Laterality: Bilateral;  . NASAL SINUS SURGERY    . PULSE GENERATOR IMPLANT Left 09/11/2016   Procedure: Left chest-Change implantable pulse generator battery;  Surgeon: Maeola HarmanJoseph Stern, MD;  [facility];  Service: Neurosurgery;  Laterality: Left;  Left chest-Change implantable pulse generator battery  . PULSE GENERATOR IMPLANT Left 04/06/2018   Procedure: Revision of left chest implantable pulse generator, extension, and pocket adapter;  Surgeon: Maeola HarmanStern, Joseph, MD;  [facility];  Service: Neurosurgery;   Laterality: Left;  . SUBTHALAMIC STIMULATOR BATTERY REPLACEMENT  12/09/2011   Procedure: SUBTHALAMIC STIMULATOR BATTERY REPLACEMENT;  Surgeon: Maeola HarmanJoseph Stern, MD;  Location: [facility];  Service: Neurosurgery;  Laterality: N/A;   deep brain stimulator, implantable pulse generator change  . SUBTHALAMIC STIMULATOR BATTERY REPLACEMENT Left 04/21/2014   Procedure: Deep brain stimulator battery change;  Surgeon: Maeola HarmanJoseph Stern, MD;  Location: [facility];  Service: Neurosurgery;  Laterality: Left;  Deep brain stimulator battery change  . SUBTHALAMIC STIMULATOR BATTERY REPLACEMENT N/A 08/10/2018   Procedure: Replacement of deep brain stimulator pulse generator/left abdomen with long lead extensions;  Surgeon: Maeola HarmanStern, Joseph, MD;  [facility];  Service: Neurosurgery;  Laterality: N/A;     FAMILY HISTORY   Family History  Problem Relation Age of Onset  . Myoclonus Mother   . Breast cancer Mother   . Prostate cancer Father   . Heart disease Paternal Grandfather   . Myoclonus Maternal Uncle      SOCIAL HISTORY   Social History   Tobacco Use  . Smoking status: Never Smoker  . Smokeless tobacco: Current User    Types: Snuff  Substance Use Topics  . Alcohol use: No    Alcohol/week: 0.0 standard drinks  . Drug use: No     MEDICATIONS   Current Medication:  Current Facility-Administered Medications:  .  acetaminophen (TYLENOL) tablet 650 mg, 650 mg, Oral, Q6H PRN **OR** acetaminophen (TYLENOL) suppository 650 mg, 650 mg, Rectal, Q6H PRN, Mansy, Jan A, MD .  budesonide (PULMICORT) nebulizer solution 0.25 mg, 0.25  mg, Nebulization, BID, Harlon DittyKeene, Jeremiah D, NP, 0.25 mg at 12/31/18 [id number] .  chlorhexidine (PERIDEX) 0.12 % solution 15 mL, 15 mL, Mouth Rinse, BID, Harlon DittyKeene, Jeremiah D, NP, 15 mL at 12/30/18 2107 .  Chlorhexidine Gluconate Cloth 2 % PADS 6 each, 6 each, Topical, Daily, Mansy, Vernetta HoneyJan A, MD, 6 each at 12/30/18 0913 .  enoxaparin (LOVENOX) injection 40 mg, 40 mg, Subcutaneous, Q24H,  Bertram SavinSimpson, Michael L, RPH .  gabapentin (NEURONTIN) tablet 600 mg, 600 mg, Oral, QID, Mansy, Jan A, MD, 600 mg at 12/30/18 2250 .  guaiFENesin (MUCINEX) 12 hr tablet 600 mg, 600 mg, Oral, BID, Mansy, Jan A, MD, 600 mg at 12/30/18 2105 .  HYDROcodone-acetaminophen (NORCO/VICODIN) 5-325 MG per tablet 1-2 tablet, 1-2 tablet, Oral, Q4H PRN, Mansy, Jan A, MD .  ipratropium-albuterol (DUONEB) 0.5-2.5 (3) MG/3ML nebulizer solution 3 mL, 3 mL, Nebulization, Q4H, Harlon DittyKeene, Jeremiah D, NP, 3 mL at 12/31/18 [id number] .  lisinopril (ZESTRIL) tablet 40 mg, 40 mg, Oral, Daily, Mansy, Jan A, MD, 40 mg at 12/30/18 0913 .  magnesium hydroxide (MILK OF MAGNESIA) suspension 30 mL, 30 mL, Oral, Daily PRN, Mansy, Jan A, MD .  MEDLINE mouth rinse, 15 mL, Mouth Rinse, q12n4p, Harlon DittyKeene, Jeremiah D, NP, 15 mL at 12/30/18 1734 .  ondansetron (ZOFRAN) tablet 4 mg, 4 mg, Oral, Q6H PRN **OR** ondansetron (ZOFRAN) injection 4 mg, 4 mg, Intravenous, Q6H PRN, Mansy, Jan A, MD .  pantoprazole (PROTONIX) EC tablet 40 mg, 40 mg, Oral, Daily, Mansy, Jan A, MD, 40 mg at 12/30/18 0913 .  [COMPLETED] methylPREDNISolone sodium succinate (SOLU-MEDROL) 125 mg/2 mL injection 60 mg, 60 mg, Intravenous, Q6H, 60 mg at 12/30/18 1847 **FOLLOWED BY** predniSONE (DELTASONE) tablet 40 mg, 40 mg, Oral, Q breakfast, Harlon DittyKeene, Jeremiah D, NP .  traZODone (DESYREL) tablet 25 mg, 25 mg, Oral, QHS PRN, Mansy, Jan A, MD    ALLERGIES   Penicillins    REVIEW OF SYSTEMS    10 point ROS neg except as per HPI  PHYSICAL EXAMINATION   Vitals:   12/31/18 0403 12/31/18 0725  BP: 107/69   Pulse: 78   Resp: 17   Temp: 97.6 F (36.4 C) (!) 97.3 F (36.3 C)  SpO2: 99%     GENERAL:mild distress due to sob HEAD: Normocephalic, atraumatic.  EYES: Pupils equal, round, reactive to light.  No scleral icterus.  MOUTH: Moist mucosal membrane. NECK: Supple. No thyromegaly. No nodules. No JVD.  PULMONARY: rhonic b/l no wheezing CARDIOVASCULAR: S1 and S2. Regular rate  and rhythm. No murmurs, rubs, or gallops.  GASTROINTESTINAL: Soft, nontender, non-distended. No masses. Positive bowel sounds. No hepatosplenomegaly.  MUSCULOSKELETAL: No swelling, clubbing, or edema.  NEUROLOGIC: Mild distress due to acute illness SKIN:intact,warm,dry   LABS AND IMAGING       LAB RESULTS: Recent Labs  Lab 12/29/18 2014 12/30/18 0213 12/31/18 0411  NA 140 140 139  K 4.1 4.8 4.7  CL 106 109 109  CO2 24 24 25   BUN 9 12 19   CREATININE 0.87 0.85 0.82  GLUCOSE 151* 164* 144*   Recent Labs  Lab 12/29/18 2014 12/30/18 0213 12/31/18 0411  HGB 17.7* 15.3 13.5  HCT 53.1* 46.7 41.2  WBC 22.6* 16.0* 30.0*  PLT 306 237 211     IMAGING RESULTS: No results found.    ASSESSMENT AND PLAN    -Multidisciplinary rounds held today  Acute Hypoxic Respiratory Failure -due to acute COPD exacerbation  - COPD care path with duo nebs and empiric abx   -  IS at bedside - needs PFTs and inhaler optimiziation  - pulmonary rehab -complicated by asthma overlap, panic attacks with poss VCD ID -continue IV abx as prescibed -follow up cultures  GI/Nutrition GI PROPHYLAXIS as indicated DIET-->TF's as tolerated Constipation protocol as indicated  ENDO - ICU hypoglycemic\Hyperglycemia protocol -check FSBS per protocol   ELECTROLYTES -follow labs as needed -replace as needed -pharmacy consultation   DVT/GI PRX ordered -SCDs  TRANSFUSIONS AS NEEDED MONITOR FSBS ASSESS the need for LABS as needed   Critical care provider statement:    Critical care time (minutes):  32   Critical care time was exclusive of:  Separately billable procedures and treating other patients   Critical care was necessary to treat or prevent imminent or life-threatening deterioration of the following conditions:  acute copd exacerbation, severe anxiety disorder, asthma, hx of noncompliance , acute hypoxemia, multiple comorbid conditions   Critical care was time spent personally by me  on the following activities:  Development of treatment plan with patient or surrogate, discussions with consultants, evaluation of patient's response to treatment, examination of patient, obtaining history from patient or surrogate, ordering and performing treatments and interventions, ordering and review of laboratory studies and re-evaluation of patient's condition.  I assumed direction of critical care for this patient from another provider in my specialty: no    This document was prepared using Dragon voice recognition software and may include unintentional dictation errors.    Ottie Glazier, M.D.  Division of [place]

## 2018-12-31 NOTE — Progress Notes (Signed)
[employer] - [place] at [facility]                                                                                                                                                                                  Patient Demographics   David Rhodes, is a 63 y.o. male, DOB - 1955/12/02, ZOX:[id number]  Admit date - 12/29/2018   Admitting Physician Hannah Beat, MD  Outpatient Primary MD for the patient is Reubin Milan, MD   LOS - 1  Subjective: Patient continues to be short of breath   Review of Systems:   CONSTITUTIONAL: No documented fever. No fatigue, weakness. No weight gain, no weight loss.  EYES: No blurry or double vision.  ENT: No tinnitus. No postnasal drip. No redness of the oropharynx.  RESPIRATORY: No cough, no wheeze, no hemoptysis.  Positive dyspnea.  CARDIOVASCULAR: No chest pain. No orthopnea. No palpitations. No syncope.  GASTROINTESTINAL: No nausea, no vomiting or diarrhea. No abdominal pain. No melena or hematochezia.  GENITOURINARY: No dysuria or hematuria.  ENDOCRINE: No polyuria or nocturia. No heat or cold intolerance.  HEMATOLOGY: No anemia. No bruising. No bleeding.  INTEGUMENTARY: No rashes. No lesions.  MUSCULOSKELETAL: No arthritis. No swelling. No gout.  NEUROLOGIC: No numbness, tingling, or ataxia. No seizure-type activity.  PSYCHIATRIC: No anxiety. No insomnia. No ADD.    Vitals:   Vitals:   12/31/18 0337 12/31/18 0403 12/31/18 0725 12/31/18 1357  BP:  107/69  130/88  Pulse:  78    Resp:  17    Temp:  97.6 F (36.4 C) (!) 97.3 F (36.3 C)   TempSrc:  Axillary Axillary   SpO2: 97% 99%    Weight:      Height:        Wt Readings from Last 3 Encounters:  12/30/18 67.3 kg  12/25/18 66.6 kg  11/16/18 68 kg     Intake/Output Summary (Last 24 hours) at 12/31/2018 1508 Last data filed at 12/31/2018 1425 Gross per 24 hour  Intake 1323.17 ml  Output 650 ml  Net 673.17 ml    Physical Exam:   GENERAL: Patient with  shortness of breath with accessory muscle usage HEAD, EYES, EARS, NOSE AND THROAT: Atraumatic, normocephalic. Extraocular muscles are intact. Pupils equal and reactive to light. Sclerae anicteric. No conjunctival injection. No oro-pharyngeal erythema.  NECK: Supple. There is no jugular venous distention. No bruits, no lymphadenopathy, no thyromegaly.  HEART: Regular rate and rhythm,. No murmurs, no rubs, no clicks.  LUNGS: Diminished breath sounds bilaterally without any accessory muscle usage ABDOMEN: Soft, flat, nontender, nondistended. Has good bowel sounds. No hepatosplenomegaly appreciated.  EXTREMITIES: No evidence of any cyanosis,  clubbing, or peripheral edema.  +2 pedal and radial pulses bilaterally.  NEUROLOGIC: The patient is alert, awake, and oriented x3 with no focal motor or sensory deficits appreciated bilaterally.  SKIN: Moist and warm with no rashes appreciated.  Psych: Not anxious, depressed LN: No inguinal LN enlargement    Antibiotics   Anti-infectives (From admission, onward)   Start     Dose/Rate Route Frequency Ordered Stop   12/31/18 1000  azithromycin (ZITHROMAX) tablet 500 mg  Status:  Discontinued     500 mg Oral Daily 12/30/18 0833 12/30/18 1003   12/31/18 1000  cefTRIAXone (ROCEPHIN) 1 g in sodium chloride 0.9 % 100 mL IVPB  Status:  Discontinued     1 g 200 mL/hr over 30 Minutes Intravenous Every 24 hours 12/30/18 0833 12/30/18 1003   12/31/18 0100  azithromycin (ZITHROMAX) tablet 500 mg  Status:  Discontinued     500 mg Oral Daily 12/30/18 0050 12/30/18 0833   12/30/18 0700  cefTRIAXone (ROCEPHIN) 1 g in sodium chloride 0.9 % 100 mL IVPB  Status:  Discontinued     1 g 200 mL/hr over 30 Minutes Intravenous Every 24 hours 12/30/18 0056 12/30/18 0833   12/30/18 0100  azithromycin (ZITHROMAX) 500 mg in sodium chloride 0.9 % 250 mL IVPB     500 mg 250 mL/hr over 60 Minutes Intravenous Every 24 hours 12/30/18 0050 12/30/18 0500   12/30/18 0100  cefTRIAXone  (ROCEPHIN) 1 g in sodium chloride 0.9 % 100 mL IVPB  Status:  Discontinued     1 g 200 mL/hr over 30 Minutes Intravenous Every 24 hours 12/30/18 0050 12/30/18 0057   12/29/18 2315  vancomycin (VANCOCIN) 1,750 mg in sodium chloride 0.9 % 500 mL IVPB     1,750 mg 250 mL/hr over 120 Minutes Intravenous  Once 12/29/18 2307 12/30/18 0135   12/29/18 2300  vancomycin (VANCOCIN) IVPB 1000 mg/200 mL premix  Status:  Discontinued     1,000 mg 200 mL/hr over 60 Minutes Intravenous  Once 12/29/18 2250 12/29/18 2307   12/29/18 2300  ceFEPIme (MAXIPIME) 2 g in sodium chloride 0.9 % 100 mL IVPB     2 g 200 mL/hr over 30 Minutes Intravenous  Once 12/29/18 2250 12/30/18 0000      Medications   Scheduled Meds: . budesonide (PULMICORT) nebulizer solution  0.25 mg Nebulization BID  . chlorhexidine  15 mL Mouth Rinse BID  . Chlorhexidine Gluconate Cloth  6 each Topical Daily  . enoxaparin (LOVENOX) injection  40 mg Subcutaneous Q24H  . gabapentin  600 mg Oral QID  . guaiFENesin  600 mg Oral BID  . ipratropium-albuterol  3 mL Nebulization Q4H  . lisinopril  40 mg Oral Daily  . mouth rinse  15 mL Mouth Rinse q12n4p  . pantoprazole  40 mg Oral Daily  . predniSONE  40 mg Oral Q breakfast   Continuous Infusions:  PRN Meds:.acetaminophen **OR** acetaminophen, HYDROcodone-acetaminophen, magnesium hydroxide, ondansetron **OR** ondansetron (ZOFRAN) IV, traZODone   Data Review:   Micro Results Recent Results (from the past 240 hour(s))  SARS Coronavirus 2 (CEPHEID- Performed in [facility] hospital lab), Hosp Order     Status: None   Collection Time: 12/25/18  3:02 AM   Specimen: Nasopharyngeal Swab  Result Value Ref Range Status   SARS Coronavirus 2 NEGATIVE NEGATIVE Final    Comment: (NOTE) If result is NEGATIVE SARS-CoV-2 target nucleic acids are NOT DETECTED. The SARS-CoV-2 RNA is generally detectable in upper and lower  respiratory specimens  during the acute phase of infection. The lowest   concentration of SARS-CoV-2 viral copies this assay can detect is 250  copies / mL. A negative result does not preclude SARS-CoV-2 infection  and should not be used as the sole basis for treatment or other  patient management decisions.  A negative result may occur with  improper specimen collection / handling, submission of specimen other  than nasopharyngeal swab, presence of viral mutation(s) within the  areas targeted by this assay, and inadequate number of viral copies  (<250 copies / mL). A negative result must be combined with clinical  observations, patient history, and epidemiological information. If result is POSITIVE SARS-CoV-2 target nucleic acids are DETECTED. The SARS-CoV-2 RNA is generally detectable in upper and lower  respiratory specimens dur ing the acute phase of infection.  Positive  results are indicative of active infection with SARS-CoV-2.  Clinical  correlation with patient history and other diagnostic information is  necessary to determine patient infection status.  Positive results do  not rule out bacterial infection or co-infection with other viruses. If result is PRESUMPTIVE POSTIVE SARS-CoV-2 nucleic acids MAY BE PRESENT.   A presumptive positive result was obtained on the submitted specimen  and confirmed on repeat testing.  While 2019 novel coronavirus  (SARS-CoV-2) nucleic acids may be present in the submitted sample  additional confirmatory testing may be necessary for epidemiological  and / or clinical management purposes  to differentiate between  SARS-CoV-2 and other Sarbecovirus currently known to infect humans.  If clinically indicated additional testing with an alternate test  methodology [phone]) is advised. The SARS-CoV-2 RNA is generally  detectable in upper and lower respiratory sp ecimens during the acute  phase of infection. The expected result is Negative. Fact Sheet for Patients:  StrictlyIdeas.no Fact Sheet  for Healthcare Providers: BankingDealers.co.za This test is not yet approved or cleared by the [place] FDA and has been authorized for detection and/or diagnosis of SARS-CoV-2 by FDA under an Emergency Use Authorization (EUA).  This EUA will remain in effect (meaning this test can be used) for the duration of the COVID-19 declaration under Section 564(b)(1) of the Act, 21 U.S.C. section 360bbb-3(b)(1), unless the authorization is terminated or revoked sooner. Performed at [facility], [address] [place], [place] [address]   SARS Coronavirus 2 [facility] order, Performed in [facility] hospital lab)     Status: None   Collection Time: 12/25/18  3:40 AM  Result Value Ref Range Status   SARS Coronavirus 2 NEGATIVE NEGATIVE Final    Comment: (NOTE) If result is NEGATIVE SARS-CoV-2 target nucleic acids are NOT DETECTED. The SARS-CoV-2 RNA is generally detectable in upper and lower  respiratory specimens during the acute phase of infection. The lowest  concentration of SARS-CoV-2 viral copies this assay can detect is 250  copies / mL. A negative result does not preclude SARS-CoV-2 infection  and should not be used as the sole basis for treatment or other  patient management decisions.  A negative result may occur with  improper specimen collection / handling, submission of specimen other  than nasopharyngeal swab, presence of viral mutation(s) within the  areas targeted by this assay, and inadequate number of viral copies  (<250 copies / mL). A negative result must be combined with clinical  observations, patient history, and epidemiological information. If result is POSITIVE SARS-CoV-2 target nucleic acids are DETECTED. The SARS-CoV-2 RNA is generally detectable in upper and lower  respiratory specimens dur ing the  acute phase of infection.  Positive  results are indicative of active infection with SARS-CoV-2.  Clinical  correlation with  patient history and other diagnostic information is  necessary to determine patient infection status.  Positive results do  not rule out bacterial infection or co-infection with other viruses. If result is PRESUMPTIVE POSTIVE SARS-CoV-2 nucleic acids MAY BE PRESENT.   A presumptive positive result was obtained on the submitted specimen  and confirmed on repeat testing.  While 2019 novel coronavirus  (SARS-CoV-2) nucleic acids may be present in the submitted sample  additional confirmatory testing may be necessary for epidemiological  and / or clinical management purposes  to differentiate between  SARS-CoV-2 and other Sarbecovirus currently known to infect humans.  If clinically indicated additional testing with an alternate test  methodology [phone]([id number]) is advised. The SARS-CoV-2 RNA is generally  detectable in upper and lower respiratory sp ecimens during the acute  phase of infection. The expected result is Negative. Fact Sheet for Patients:  BoilerBrush.com.cyhttps://www.fda.gov/media/136312/download Fact Sheet for Healthcare Providers: https://pope.com/https://www.fda.gov/media/136313/download This test is not yet approved or cleared by the [place] FDA and has been authorized for detection and/or diagnosis of SARS-CoV-2 by FDA under an Emergency Use Authorization (EUA).  This EUA will remain in effect (meaning this test can be used) for the duration of the COVID-19 declaration under Section 564(b)(1) of the Act, 21 U.S.C. section 360bbb-3(b)(1), unless the authorization is terminated or revoked sooner. Performed at [facility], [address] [place], [place] [address]   MRSA PCR Screening     Status: None   Collection Time: 12/25/18  6:22 AM   Specimen: Nasal Mucosa; Nasopharyngeal  Result Value Ref Range Status   MRSA by PCR NEGATIVE NEGATIVE Final    Comment:        The GeneXpert MRSA Assay (FDA approved for NASAL specimens only), is one component of a comprehensive MRSA  colonization surveillance program. It is not intended to diagnose MRSA infection nor to guide or monitor treatment for MRSA infections. Performed at [facility], [address] [place], [place] [address]   Blood Culture (routine x 2)     Status: None (Preliminary result)   Collection Time: 12/29/18  8:39 PM   Specimen: BLOOD  Result Value Ref Range Status   Specimen Description BLOOD RIGHT ANTECUBITAL  Final   Special Requests   Final    BOTTLES DRAWN AEROBIC AND ANAEROBIC Blood Culture results may not be optimal due to an excessive volume of blood received in culture bottles   Culture   Final    NO GROWTH 2 DAYS Performed at [facility], [address] [place], [place] [address]    Report Status PENDING  Incomplete  Blood Culture (routine x 2)     Status: None (Preliminary result)   Collection Time: 12/29/18  8:39 PM   Specimen: BLOOD  Result Value Ref Range Status   Specimen Description BLOOD BLOOD LEFT HAND  Final   Special Requests   Final    BOTTLES DRAWN AEROBIC AND ANAEROBIC Blood Culture adequate volume   Culture   Final    NO GROWTH 2 DAYS Performed at [facility], [address] [place], [place] [address]    Report Status PENDING  Incomplete  SARS Coronavirus 2 (CEPHEID- Performed in [facility] hospital lab), Hosp Order     Status: None   Collection Time: 12/29/18  8:39 PM   Specimen: Nasopharyngeal Swab  Result Value Ref Range Status  SARS Coronavirus 2 NEGATIVE NEGATIVE Final    Comment: (NOTE) If result is NEGATIVE SARS-CoV-2 target nucleic acids are NOT DETECTED. The SARS-CoV-2 RNA is generally detectable in upper and lower  respiratory specimens during the acute phase of infection. The lowest  concentration of SARS-CoV-2 viral copies this assay can detect is 250  copies / mL. A negative result does not preclude SARS-CoV-2 infection  and should not be used as the sole basis for treatment or other  patient management  decisions.  A negative result may occur with  improper specimen collection / handling, submission of specimen other  than nasopharyngeal swab, presence of viral mutation(s) within the  areas targeted by this assay, and inadequate number of viral copies  (<250 copies / mL). A negative result must be combined with clinical  observations, patient history, and epidemiological information. If result is POSITIVE SARS-CoV-2 target nucleic acids are DETECTED. The SARS-CoV-2 RNA is generally detectable in upper and lower  respiratory specimens dur ing the acute phase of infection.  Positive  results are indicative of active infection with SARS-CoV-2.  Clinical  correlation with patient history and other diagnostic information is  necessary to determine patient infection status.  Positive results do  not rule out bacterial infection or co-infection with other viruses. If result is PRESUMPTIVE POSTIVE SARS-CoV-2 nucleic acids MAY BE PRESENT.   A presumptive positive result was obtained on the submitted specimen  and confirmed on repeat testing.  While 2019 novel coronavirus  (SARS-CoV-2) nucleic acids may be present in the submitted sample  additional confirmatory testing may be necessary for epidemiological  and / or clinical management purposes  to differentiate between  SARS-CoV-2 and other Sarbecovirus currently known to infect humans.  If clinically indicated additional testing with an alternate test  methodology [phone]) is advised. The SARS-CoV-2 RNA is generally  detectable in upper and lower respiratory sp ecimens during the acute  phase of infection. The expected result is Negative. Fact Sheet for Patients:  BoilerBrush.com.cy Fact Sheet for Healthcare Providers: https://pope.com/ This test is not yet approved or cleared by the [place] FDA and has been authorized for detection and/or diagnosis of SARS-CoV-2 by FDA under an  Emergency Use Authorization (EUA).  This EUA will remain in effect (meaning this test can be used) for the duration of the COVID-19 declaration under Section 564(b)(1) of the Act, 21 U.S.C. section 360bbb-3(b)(1), unless the authorization is terminated or revoked sooner. Performed at [facility], [address], [place], [place] [address]     Radiology Reports Dg Chest [place] 1 View  Result Date: 12/31/2018 CLINICAL DATA:  Acute respiratory failure. EXAM: PORTABLE CHEST 1 VIEW COMPARISON:  Chest x-ray dated December 29, 2018. FINDINGS: The heart size and mediastinal contours are within normal limits. Normal pulmonary vascularity. Lungs remain hyperinflated. No focal consolidation, pleural effusion, or pneumothorax. No acute osseous abnormality. IMPRESSION: No active disease. Electronically Signed   By: Obie Dredge M.D.   On: 12/31/2018 10:03   Dg Chest Port 1 View  Result Date: 12/29/2018 CLINICAL DATA:  Respiratory distress EXAM: PORTABLE CHEST 1 VIEW COMPARISON:  December 25, 2018 FINDINGS: The lungs are hyperexpanded. The cardiac silhouette is stable. There is no acute osseous abnormality. No focal infiltrate. No pneumothorax, however the right lung apex is suboptimally evaluated secondary to overlapping structures external to the patient. IMPRESSION: No acute cardiopulmonary process. Stable exam when compared to study dated December 25, 2018 Electronically Signed   By: Katherine Mantle M.D.   On:  12/29/2018 20:19   Dg Chest Portable 1 View  Result Date: 12/25/2018 CLINICAL DATA:  Shortness of breath. EXAM: PORTABLE CHEST 1 VIEW COMPARISON:  Oct 29, 2018 FINDINGS: The heart size is normal. There are prominent interstitial lung markings bilaterally which is stable from prior studies. There is no pneumothorax. The lungs appear hyperexpanded. There is no focal area of consolidation. No large pleural effusion. IMPRESSION: Hyperexpanded lungs with stable prominent interstitial lung markings  bilaterally. Electronically Signed   By: Katherine Mantle M.D.   On: 12/25/2018 03:38     CBC Recent Labs  Lab 12/25/18 0302 12/29/18 2014 12/30/18 0213 12/31/18 0411  WBC 11.9* 22.6* 16.0* 30.0*  HGB 16.7 17.7* 15.3 13.5  HCT 50.4 53.1* 46.7 41.2  PLT 243 306 237 211  MCV 89.2 88.8 90.2 90.0  MCH 29.6 29.6 29.5 29.5  MCHC 33.1 33.3 32.8 32.8  RDW 12.8 12.8 12.6 13.0  LYMPHSABS 3.4 4.8*  --  1.3  MONOABS 0.8 1.4*  --  1.0  EOSABS 2.0* 2.4*  --  0.0  BASOSABS 0.2* 0.3*  --  0.1    Chemistries  Recent Labs  Lab 12/25/18 0302 12/29/18 2014 12/30/18 0213 12/31/18 0411  NA 140 140 140 139  K 4.0 4.1 4.8 4.7  CL 105 106 109 109  CO2 GLUCOSE 111* 151* 164* 144*  BUN CREATININE 0.93 0.87 0.85 0.82  CALCIUM 10.8* 9.9 9.5 10.1  MG  --  2.3  --   --   AST 17 19  --   --   ALT 14 17  --   --   ALKPHOS 85 88  --   --   BILITOT 0.6 0.6  --   --    ------------------------------------------------------------------------------------------------------------------ estimated creatinine clearance is 87.8 mL/min (by C-G formula based on SCr of 0.82 mg/dL). ------------------------------------------------------------------------------------------------------------------ No results for input(s): HGBA1C in the last 72 hours. ------------------------------------------------------------------------------------------------------------------ No results for input(s): CHOL, HDL, LDLCALC, TRIG, CHOLHDL, LDLDIRECT in the last 72 hours. ------------------------------------------------------------------------------------------------------------------ No results for input(s): TSH, T4TOTAL, T3FREE, THYROIDAB in the last 72 hours.  Invalid input(s): FREET3 ------------------------------------------------------------------------------------------------------------------ No results for input(s): VITAMINB12, FOLATE, FERRITIN, TIBC, IRON, RETICCTPCT in the last 72  hours.  Coagulation profile No results for input(s): INR, PROTIME in the last 168 hours.  No results for input(s): DDIMER in the last 72 hours.  Cardiac Enzymes No results for input(s): CKMB, TROPONINI, MYOGLOBIN in the last 168 hours.  Invalid input(s): CK ------------------------------------------------------------------------------------------------------------------ Invalid input(s): POCBNP    Assessment & Plan   1.  Acute respiratory failure secondary to COPD acute exacerbation requiring BiPAP.  Initially now on oxygen therapy Continue therapy for acute COPD exacerbation Continue nebulizer therapy, steroids  2.  Acute bronchitis.  This is likely the culprit for #1.  Management as above.  Pulmonary antibiotics have been discontinued  3.  Hypertension.  Continue Zestril.  4.  GERD.  Continue PPI therapy.  5.  DVT prophylaxis.  Subcutaneous Lovenox.      Code Status Orders  (From admission, onward)         Start     Ordered   12/30/18 0045  Full code  Continuous     12/30/18 0050        Code Status History    Date Active Date Inactive Code Status Order ID Comments User Context   12/25/2018 0612 12/25/2018 1301 Full Code [id number]  Arnaldo Natal, MD Inpatient   08/10/2018 1731  08/11/2018 1259 Full Code [id number]  Maeola HarmanStern, Joseph, MD Inpatient   04/06/2018 2005 04/08/2018 1946 Full Code [id number]  Maeola HarmanStern, Joseph, MD Inpatient   10/11/2017 2234 10/12/2017 1432 Full Code [id number]  Cammy CopaMaier, Angela, MD Inpatient   Advance Care Planning Activity           Consults pulmonary critical care  DVT Prophylaxis  Lovenox  Lab Results  Component Value Date   PLT 211 12/31/2018     Time Spent in minutes 45 minutes of additional critical care time spent 1 PM to 1:45 PM  Greater than 50% of time spent in care coordination and counseling patient regarding the condition and plan of care.   Auburn BilberryShreyang Romaine Neville M.D on 12/31/2018 at 3:08 PM  Between 7am to 6pm - Pager -  [phone]  After 6pm go to [email or web address] - Social research officer, governmentpassword [email or web address]  [employer]   Office  [phone]

## 2019-01-01 LAB — HIV ANTIBODY (ROUTINE TESTING W REFLEX): HIV Screen 4th Generation wRfx: NONREACTIVE

## 2019-01-01 NOTE — Progress Notes (Signed)
Advanced care plan.  Purpose of the Encounter: CODE STATUS  Parties in Attendance: Patient himself  Patient's Decision Capacity: Intact  Subjective/Patient's story:  The patient with past medical history of COPD and hypertension presents to the emergency department complaining of shortness of breath.  The patient reports having cough and shortness of breath x1 week that has progressively worsened  Objective/Medical story  Due to recurrent admissions for COPD I discussed with the patient regarding his desires for cardiac and pulmonary resuscitation.  Patient states that he would like everything to be done and wants to be a full code.  Goals of care determination:   Full code CODE STATUS: Full code   Time spent discussing advanced care planning: 16 minutes

## 2019-01-03 LAB — CULTURE, BLOOD (ROUTINE X 2)
Culture: NO GROWTH
Culture: NO GROWTH
Special Requests: ADEQUATE

## 2019-01-03 NOTE — Progress Notes (Addendum)
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 - Significant eosinophilia seen with recent exacerbations which suggests a degree of reactive airways disease which could be asthma/ACOS. - We will check alpha-1, PFT.  Patient has Breo and Spiriva but is using them on alternating days, instructed to use them both daily, overnight oximetry. - Pneumovax 01/04/2019, refused today.  Will administer Prevnar at age 14.  Dyspnea on exertion.  - Likely secondary to above, patient may benefit from referral to pulmonary rehab after COVID-19 restrictions have been lifted.  RLS. - Symptoms consistent with RLS.  Continue Requip which appears to be controlling symptoms well.  Lung Nodule.  - Multiple small lung nodules, last seen in 2017.  His lung nodules appeared to be not significantly changed between 2016 and 2017 suggesting a benign etiology, though he did not complete 2 years of surveillance.  Will consider ordering follow-up CT chest at next visit to complete surveillance.  Orders Placed This Encounter  Procedures  . Alpha-1-antitrypsin  . Alpha-1 antitrypsin phenotype  . IgE  . Pulmonary Function Test [facility] Only  . Pulse oximetry, overnight  . 6 minute walk   Return in about 3 months (around 04/06/2019).   Date: 01/03/2019  MRN# [id number] David Rhodes 07-15-1955   David Rhodes is a 63 y.o. old male seen in follow up for chief complaint of  Chief Complaint  Patient presents with  . office visit    HPI:  David Rhodes is a 63 y.o. male with a history of COPD and lung nodules, last seen here by Dr. Stevenson Clinch about 3 years ago. Presented to [facility] ED on 10/29/2018 for COPD exacerbation, again on 12/25/2022 COPD exacerbation, placed on BiPAP, COVID  test was negative. He was DC AMA the same day.  12/29/2018, patient presented back to the ED via EMS with respiratory distress, placed on BiPAP, HIV test negative.  He was seen by Dr. Laural Roes, and was apparently set up for an outpatient visit at [facility] clinic.  However patient discharged on 7/3 AMA.  He has never smoked, he tells me that he has been around asbestos. He has proair whch he uses "when Im extremely bad" he takes it 6 times per day. He has used it twice today. He uses Breo and spiriva on alternating days.  He has occasional reflux, he has a stuffy nose.  He takes requip and gabepentin every night.   **CBC 12/31/2018>> AEC 1700-2000 **Chest x-ray 12/29/2018, CT chest 02/18/2016>> hyperinflation consistent with emphysema.  CT shows bilateral tiny pulmonary nodules, mild bronchiectatic changes with mucus impaction and some of the distal airways. Multiple pulmonary nodules  CT scan from October 2016. These are subcentimeter nodules the majority of them on the left side. Repeat CT in 01/2016 with multiple small <66mm nodules mainly in the upper lobes, I believe that these are mostly reactive nodules at this time.  **Echocardiogram 10/30/2014>> INTERPRETATION NORMAL LEFT VENTRICULAR SYSTOLIC FUNCTION NORMAL RIGHT VENTRICULAR SYSTOLIC FUNCTION MILD VALVULAR REGURGITATION (See above) NO VALVULAR STENOSIS    Medication:    Current Outpatient Medications:  .  albuterol (PROVENTIL HFA;VENTOLIN HFA) 108 (90 Base) MCG/ACT inhaler, INHALE 1 TO 2 PUFFS BY MOUTH EVERY 6 HOURS AS NEEDED FOR WHEEZING OR SHORTNESS OF BREATH (Patient taking differently: Inhale 1-2 puffs into  the lungs every 6 (six) hours as needed for wheezing or shortness of breath. ), Disp: 8.5 g, Rfl: 5 .  baclofen (LIORESAL) 10 MG tablet, TAKE 1 TABLET(10 MG) BY MOUTH THREE TIMES DAILY (Patient not taking: Reported on 12/25/2018), Disp: 90 tablet, Rfl: 1 .  BREO ELLIPTA 100-25 MCG/INH AEPB, INHALE 1 PUFF INTO THE LUNGS DAILY (Patient not  taking: Reported on 12/25/2018), Disp: 60 each, Rfl: 12 .  cephALEXin (KEFLEX) 500 MG capsule, Take 1 capsule (500 mg total) by mouth 2 (two) times daily. (Patient not taking: Reported on 12/25/2018), Disp: 120 capsule, Rfl: 3 .  gabapentin (NEURONTIN) 600 MG tablet, TAKE 1 TABLET(600 MG) BY MOUTH FOUR TIMES DAILY (Patient taking differently: Take 600 mg by mouth 4 (four) times daily. ), Disp: 120 tablet, Rfl: 5 .  HYDROcodone-acetaminophen (NORCO/VICODIN) 5-325 MG tablet, Take 1-2 tablets by mouth every 4 (four) hours as needed for severe pain ((score 7 to 10)). (Patient not taking: Reported on 12/25/2018), Disp: 30 tablet, Rfl: 0 .  lisinopril (ZESTRIL) 40 MG tablet, Take 1 tablet (40 mg total) by mouth daily., Disp: 90 tablet, Rfl: 1 .  loratadine (CLARITIN) 10 MG tablet, Take 1 tablet (10 mg total) by mouth daily. (Patient taking differently: Take 10 mg by mouth daily as needed for allergies. ), Disp: 30 tablet, Rfl: 0 .  meloxicam (MOBIC) 15 MG tablet, TAKE 1 TABLET(15 MG) BY MOUTH DAILY (Patient not taking: Reported on 12/25/2018), Disp: 90 tablet, Rfl: 1 .  montelukast (SINGULAIR) 10 MG tablet, TAKE 1 TABLET(10 MG) BY MOUTH AT BEDTIME (Patient taking differently: Take 10 mg by mouth at bedtime. ), Disp: 90 tablet, Rfl: 1 .  omeprazole (PRILOSEC) 20 MG capsule, TAKE 1 CAPSULE BY MOUTH DAILY (Patient taking differently: Take 20 mg by mouth daily. ), Disp: 90 capsule, Rfl: 1 .  rOPINIRole (REQUIP) 2 MG tablet, TAKE 1 TABLET BY MOUTH TWICE DAILY (Patient taking differently: Take 2 mg by mouth 2 (two) times daily. ), Disp: 60 tablet, Rfl: 5 .  SPIRIVA HANDIHALER 18 MCG inhalation capsule, INHALE CONTENTS OF 1 CAPSULE ONCE DAILY USING HANDIHALER (Patient taking differently: Place 18 mcg into inhaler and inhale daily. ), Disp: 30 capsule, Rfl: 12   Allergies:  Penicillins   Review of Systems:  Constitutional: Feels well. Cardiovascular: Denies chest pain, exertional chest pain.  Pulmonary: Denies  hemoptysis, pleuritic chest pain.   The remainder of systems were reviewed and were found to be negative other than what is documented in the HPI.    Physical Examination:   VS: BP (!) 138/96 (BP Location: Left Arm, Cuff Size: Normal)   Pulse 86   Temp (!) 97.4 F (36.3 C) (Skin)   Ht 6' (1.829 m)   Wt 155 lb (70.3 kg)   SpO2 96%   BMI 21.02 kg/m   General Appearance: No distress  Neuro:without focal findings, mental status, speech normal, alert and oriented HEENT: PERRLA, EOM intact Pulmonary: No wheezing, No rales  CardiovascularNormal S1,S2.  No m/r/g.  Abdomen: Benign, Soft, non-tender, No masses Renal:  No costovertebral tenderness  GU:  No performed at this time. Endoc: No evident thyromegaly, no signs of acromegaly or Cushing features Skin:   warm, no rashes, no ecchymosis  Extremities: normal, no cyanosis, clubbing.     LABORATORY PANEL:   CBC Recent Labs  Lab 12/31/18 0411  WBC 30.0*  HGB 13.5  HCT 41.2  PLT 211   ------------------------------------------------------------------------------------------------------------------  Chemistries  Recent Labs  Lab 12/29/18 2014  12/31/18 0411  NA 140   < > 139  K 4.1   < > 4.7  CL 106   < > 109  CO2 24   < > 25  GLUCOSE 151*   < > 144*  BUN 9   < > 19  CREATININE 0.87   < > 0.82  CALCIUM 9.9   < > 10.1  MG 2.3  --   --   AST 19  --   --   ALT 17  --   --   ALKPHOS 88  --   --   BILITOT 0.6  --   --    < > = values in this interval not displayed.   ------------------------------------------------------------------------------------------------------------------  Cardiac Enzymes No results for input(s): TROPONINI in the last 168 hours. ------------------------------------------------------------  RADIOLOGY:   No results found for this or any previous visit. Results for orders placed during the hospital encounter of 10/11/17  DG Chest 2 View   Narrative CLINICAL DATA:  Shortness of breath   EXAM: CHEST - 2 VIEW  COMPARISON:  03/21/2017  FINDINGS: Ascending stimulator device. Mild hyperinflation with bronchitic changes. No acute airspace disease or pleural effusion. Normal cardiomediastinal silhouette. No pneumothorax.  IMPRESSION: Hyperinflation with chronic bronchitic changes. No acute airspace disease   Electronically Signed   By: Jasmine PangKim  Fujinaga M.D.   On: 10/11/2017 18:56    ------------------------------------------------------------------------------------------------------------------  Thank  you for allowing [facility] to assist in the care of your patient. Our recommendations are noted above.  Please contact us if we can be of further service.   Wells Guileseep Aleatha Taite, M.D., F.C.C.P.  Board Certified in Internal Medicine, Pulmonary Medicine, Critical Care Medicine, and Sleep Medicine.  [facility] Office Number: [phone]  01/03/2019

## 2019-01-04 ENCOUNTER — Emergency Department
Admission: EM | Admit: 2019-01-04 | Discharge: 2019-01-04 | Disposition: A | Discharge status: Home / Self Care | Payer: Medicare Other | Attending: Emergency Medicine | Admitting: Emergency Medicine

## 2019-01-04 ENCOUNTER — Other Ambulatory Visit
Admission: RE | Admit: 2019-01-04 | Discharge: 2019-01-04 | Disposition: A | Discharge status: Home / Self Care | Payer: Medicare Other | Source: Ambulatory Visit | Attending: Internal Medicine | Admitting: Internal Medicine

## 2019-01-04 ENCOUNTER — Other Ambulatory Visit: Payer: Self-pay

## 2019-01-04 ENCOUNTER — Ambulatory Visit (INDEPENDENT_AMBULATORY_CARE_PROVIDER_SITE_OTHER): Payer: Medicare Other | Admitting: Internal Medicine

## 2019-01-04 ENCOUNTER — Encounter: Payer: Self-pay | Admitting: Internal Medicine

## 2019-01-04 ENCOUNTER — Encounter: Payer: Self-pay | Admitting: Emergency Medicine

## 2019-01-04 VITALS — BP 138/96 | HR 86 | Temp 97.4°F | Ht 72.0 in | Wt 155.0 lb

## 2019-01-04 DIAGNOSIS — Z79899 Other long term (current) drug therapy: Secondary | ICD-10-CM | POA: Diagnosis not present

## 2019-01-04 DIAGNOSIS — J449 Chronic obstructive pulmonary disease, unspecified: Secondary | ICD-10-CM

## 2019-01-04 DIAGNOSIS — J4550 Severe persistent asthma, uncomplicated: Secondary | ICD-10-CM | POA: Insufficient documentation

## 2019-01-04 DIAGNOSIS — I10 Essential (primary) hypertension: Secondary | ICD-10-CM | POA: Insufficient documentation

## 2019-01-04 DIAGNOSIS — R21 Rash and other nonspecific skin eruption: Secondary | ICD-10-CM | POA: Diagnosis present

## 2019-01-04 DIAGNOSIS — F17228 Nicotine dependence, chewing tobacco, with other nicotine-induced disorders: Secondary | ICD-10-CM | POA: Diagnosis not present

## 2019-01-04 MED ORDER — HYDROCORTISONE 2.5 % EX LOTN: TOPICAL_LOTION | Freq: Two times a day (BID) | CUTANEOUS | 0 refills | Status: DC

## 2019-01-04 MED ORDER — MUPIROCIN 2 % EX OINT: TOPICAL_OINTMENT | CUTANEOUS | 0 refills | Status: DC

## 2019-01-04 NOTE — ED Provider Notes (Signed)
[facility] Emergency Department Provider Note  ____________________________________________  Time seen: Approximately 12:26 PM  I have reviewed the triage vital signs and the nursing notes.   HISTORY  Chief Complaint Rash   HPI David Rhodes is a 63 y.o. male presents to the ER for evaluation of rash. Symptoms started while hospitalized last week. Areas itch. No relief with use of alcohol pads.  Past Medical History:  Diagnosis Date  . Allergy    Hay fever  . Anxiety   . Asthma   . Chronic pain due to trauma   . COPD (chronic obstructive pulmonary disease) (HCC)   . Depression   . Essential tremor   . GERD (gastroesophageal reflux disease)   . Headache(784.0)    neck pain  . Hypertension   . Inguinal hernia    bilateral  . Neuromuscular disorder (HCC)    hx of tremors  . Pneumonia    many years ago  . Shortness of breath    when allergies flare up  . Urine incontinence   . Wears glasses     Patient Active Problem List   Diagnosis Date Noted  . COPD exacerbation (HCC) 12/30/2018  . Acute on chronic respiratory failure with hypoxemia (HCC) 12/25/2018  . Inguinal hernia recurrent bilateral 11/16/2018  . Chronic left shoulder pain 10/05/2018  . Tremor 08/10/2018  . PICC (peripherally inserted central catheter) in place 04/16/2018  . 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 Essential tremor 10/18/2017  . Cervical radiculopathy 07/23/2017  . Bilateral temporomandibular joint pain 07/23/2017  . Neck pain 05/19/2017  . Medication monitoring encounter 09/11/2016  . Pulmonary nodules 12/31/2015  . COPD (chronic obstructive pulmonary disease) (HCC) 12/27/2015  . Chronic cough 12/27/2015  . Asthma 09/24/2015  . Allergic rhinitis 11/11/2014  . Nerve root pain 11/11/2014  . Dyskinesia 11/11/2014  . Acid reflux 11/11/2014  . Calcium blood increased 11/11/2014  . Affective disorder (HCC) 11/11/2014  . Essential hypertension  12/25/2011  . Chronic back pain 12/25/2011    Past Surgical History:  Procedure Laterality Date  . DEEP BRAIN STIMULATOR PLACEMENT    . INGUINAL HERNIA REPAIR Bilateral 02/07/2014   Procedure:  BILATERAL INGUINAL HERNIA REPAIR;  Surgeon: Axel FillerArmando Ramirez, MD;  Location: [facility] OR;  Service: General;  Laterality: Bilateral;  . INSERTION OF MESH Bilateral 02/07/2014   Procedure: INSERTION OF MESH;  Surgeon: Axel FillerArmando Ramirez, MD;  Location: [facility] OR;  Service: General;  Laterality: Bilateral;  . NASAL SINUS SURGERY    . PULSE GENERATOR IMPLANT Left 09/11/2016   Procedure: Left chest-Change implantable pulse generator battery;  Surgeon: Maeola HarmanJoseph Stern, MD;  [facility] OR;  Service: Neurosurgery;  Laterality: Left;  Left chest-Change implantable pulse generator battery  . PULSE GENERATOR IMPLANT Left 04/06/2018   Procedure: Revision of left chest implantable pulse generator, extension, and pocket adapter;  Surgeon: Maeola HarmanStern, Joseph, MD;  [facility];  Service: Neurosurgery;  Laterality: Left;  . SUBTHALAMIC STIMULATOR BATTERY REPLACEMENT  12/09/2011   Procedure: SUBTHALAMIC STIMULATOR BATTERY REPLACEMENT;  Surgeon: Maeola HarmanJoseph Stern, MD;  Location: [facility] NEURO ORS;  Service: Neurosurgery;  Laterality: N/A;   deep brain stimulator, implantable pulse generator change  . SUBTHALAMIC STIMULATOR BATTERY REPLACEMENT Left 04/21/2014   Procedure: Deep brain stimulator battery change;  Surgeon: Maeola HarmanJoseph Stern, MD;  Location: [facility] NEURO ORS;  Service: Neurosurgery;  Laterality: Left;  Deep brain stimulator battery change  . SUBTHALAMIC STIMULATOR BATTERY REPLACEMENT N/A 08/10/2018   Procedure: Replacement of deep brain stimulator pulse  generator/left abdomen with long lead extensions;  Surgeon: Maeola HarmanStern, Joseph, MD;  [facility];  Service: Neurosurgery;  Laterality: N/A;    Prior to Admission medications   Medication Sig Start Date End Date Taking? Authorizing Provider  albuterol (PROVENTIL HFA;VENTOLIN HFA) 108 (90 Base) MCG/ACT  inhaler INHALE 1 TO 2 PUFFS BY MOUTH EVERY 6 HOURS AS NEEDED FOR WHEEZING OR SHORTNESS OF BREATH Patient taking differently: Inhale 1-2 puffs into the lungs every 6 (six) hours as needed for wheezing or shortness of breath.  07/02/18   Reubin MilanBerglund, Laura H, MD  baclofen (LIORESAL) 10 MG tablet TAKE 1 TABLET(10 MG) BY MOUTH THREE TIMES DAILY 12/05/18   Reubin MilanBerglund, Laura H, MD  BREO ELLIPTA 100-25 MCG/INH AEPB INHALE 1 PUFF INTO THE LUNGS DAILY 12/05/18   Reubin MilanBerglund, Laura H, MD  cephALEXin (KEFLEX) 500 MG capsule Take 1 capsule (500 mg total) by mouth 2 (two) times daily. 11/16/18   Ginnie SmartHatcher, Jeffrey C, MD  gabapentin (NEURONTIN) 600 MG tablet TAKE 1 TABLET(600 MG) BY MOUTH FOUR TIMES DAILY Patient taking differently: Take 600 mg by mouth 4 (four) times daily.  11/09/18   Reubin MilanBerglund, Laura H, MD  HYDROcodone-acetaminophen (NORCO/VICODIN) 5-325 MG tablet Take 1-2 tablets by mouth every 4 (four) hours as needed for severe pain ((score 7 to 10)). 08/11/18   Maeola HarmanStern, Joseph, MD  hydrocortisone 2.5 % lotion Apply topically 2 (two) times daily. 01/04/19   Anisah Kuck B, FNP  lisinopril (ZESTRIL) 40 MG tablet Take 1 tablet (40 mg total) by mouth daily. 12/05/18   Reubin MilanBerglund, Laura H, MD  loratadine (CLARITIN) 10 MG tablet Take 1 tablet (10 mg total) by mouth daily. Patient taking differently: Take 10 mg by mouth daily as needed for allergies.  10/13/17   Enedina FinnerPatel, Sona, MD  meloxicam (MOBIC) 15 MG tablet TAKE 1 TABLET(15 MG) BY MOUTH DAILY 12/06/18   Reubin MilanBerglund, Laura H, MD  montelukast (SINGULAIR) 10 MG tablet TAKE 1 TABLET(10 MG) BY MOUTH AT BEDTIME Patient taking differently: Take 10 mg by mouth at bedtime.  12/05/18   Reubin MilanBerglund, Laura H, MD  mupirocin ointment (BACTROBAN) 2 % Apply to affected area 3 times daily 01/04/19 01/04/20  Malaika Arnall, Rulon Eisenmengerari B, FNP  omeprazole (PRILOSEC) 20 MG capsule TAKE 1 CAPSULE BY MOUTH DAILY Patient taking differently: Take 20 mg by mouth daily.  12/05/18   Reubin MilanBerglund, Laura H, MD  rOPINIRole (REQUIP) 2 MG tablet TAKE  1 TABLET BY MOUTH TWICE DAILY Patient taking differently: Take 2 mg by mouth 2 (two) times daily.  10/10/18   Reubin MilanBerglund, Laura H, MD  SPIRIVA HANDIHALER 18 MCG inhalation capsule INHALE CONTENTS OF 1 CAPSULE ONCE DAILY USING HANDIHALER Patient taking differently: Place 18 mcg into inhaler and inhale daily.  10/02/18   Reubin MilanBerglund, Laura H, MD    Allergies Penicillins  Family History  Problem Relation Age of Onset  . Myoclonus Mother   . Breast cancer Mother   . Prostate cancer Father   . Heart disease Paternal Grandfather   . Myoclonus Maternal Uncle     Social History Social History   Tobacco Use  . Smoking status: Never Smoker  . Smokeless tobacco: Current User    Types: Snuff  Substance Use Topics  . Alcohol use: No    Alcohol/week: 0.0 standard drinks  . Drug use: No    Review of Systems  Constitutional: Negative for fever. Respiratory: Negative for cough or worsening shortness of breath.  Musculoskeletal: Negative for myalgias Skin: Positive for rash Neurological: Negative for numbness  or paresthesias. ____________________________________________   PHYSICAL EXAM:  VITAL SIGNS: ED Triage Vitals  Enc Vitals Group     BP 01/04/19 1137 (!) 143/98     Pulse Rate 01/04/19 1137 99     Resp 01/04/19 1137 18     Temp 01/04/19 1137 98.5 F (36.9 C)     Temp Source 01/04/19 1137 Oral     SpO2 01/04/19 1137 94 %     Weight 01/04/19 1135 154 lb 15.7 oz (70.3 kg)     Height --      Head Circumference --      Peak Flow --      Pain Score 01/04/19 1135 0     Pain Loc --      Pain Edu? --      Excl. in [place]? --      Constitutional: Chronically ill appearing. Eyes: Conjunctivae are clear without discharge or drainage. Nose: No rhinorrhea noted. Mouth/Throat: Airway is patent.  Neck: No stridor. Unrestricted range of motion observed. Cardiovascular: Capillary refill is <3 seconds.  Respiratory: Respirations are even and unlabored.. Musculoskeletal: Unrestricted range  of motion observed. Neurologic: Awake, alert, and oriented x 4.  Skin:  Scattered, raised, erythematous areas with central pinpoint punctures on the left forearm and elbow. No lymphangitis.  ____________________________________________   LABS (all labs ordered are listed, but only abnormal results are displayed)  Labs Reviewed - No data to display ____________________________________________  EKG  Not indicated. ____________________________________________  RADIOLOGY  Not indicated. ____________________________________________   PROCEDURES  Procedures ____________________________________________   INITIAL IMPRESSION / ASSESSMENT AND PLAN / ED COURSE  David Rhodes is a 63 y.o. male who presents to the emergency department for treatment and evaluation of pruritic areas on the left forearm and elbow. They appear to be insect bites. He will be treated with hydrocortisone and because he is scratching quite intensely, I will give him mupirocin ointment as well. He is to follow up with PCP or return to the ER for symptoms of concern.   Medications - No data to display   Pertinent labs & imaging results that were available during my care of the patient were reviewed by me and considered in my medical decision making (see chart for details).  ____________________________________________   FINAL CLINICAL IMPRESSION(S) / ED DIAGNOSES  Final diagnoses:  Rash and nonspecific skin eruption    ED Discharge Orders         Ordered    hydrocortisone 2.5 % lotion  2 times daily     01/04/19 1239    mupirocin ointment (BACTROBAN) 2 %     01/04/19 1239           Note:  This document was prepared using Dragon voice recognition software and may include unintentional dictation errors.   Victorino Dike, FNP 01/04/19 1324    Duffy Bruce, MD 01/05/19 1026

## 2019-01-04 NOTE — Patient Instructions (Addendum)
Take breo and spiriva BOTH DAILY.  Will check blood work.  Will check lung function test.  Will check overnight oxygen test.

## 2019-01-04 NOTE — ED Triage Notes (Signed)
Rash to right fore arm.  Three reddened areas seen to forearm.  Patient unsure how long they have been there.

## 2019-01-06 LAB — IGE: IgE (Immunoglobulin E), Serum: 316 IU/mL (ref 6–495)

## 2019-01-06 LAB — ALPHA-1 ANTITRYPSIN PHENOTYPE: A-1 Antitrypsin, Ser: 126 mg/dL (ref 101–187)

## 2019-01-06 NOTE — Progress Notes (Signed)
Pt refused pneumovax at this time.

## 2019-01-10 NOTE — Discharge Summary (Signed)
AGAINST MEDICAL ADVICE summary   Patient 63 year old with COPD who presented with worsening shortness of breath.  Patient initially required BiPAP.  He was in the ICU for COPD exasperation.  On the day of [place] patient stated that he did not want to stay in the hospital any longer.  The ICU physicians assistant discussed with with the patient regarding dangers of going home.  Including death.  Patient insisted on going home and he went home [place].

## 2019-01-17 ENCOUNTER — Other Ambulatory Visit: Payer: Self-pay

## 2019-01-17 ENCOUNTER — Ambulatory Visit (INDEPENDENT_AMBULATORY_CARE_PROVIDER_SITE_OTHER): Payer: Medicare Other

## 2019-01-17 DIAGNOSIS — J449 Chronic obstructive pulmonary disease, unspecified: Secondary | ICD-10-CM

## 2019-01-17 NOTE — Progress Notes (Signed)
SIX MIN WALK 01/17/2019  Medications none  Supplimental Oxygen during Test? (L/min) No  Laps 15.5  Baseline BP (sitting) 130/100  Baseline Heartrate 91  Baseline Dyspnea (Borg Scale) 3.5  Baseline Fatigue (Borg Scale) 3  Baseline SPO2 95  BP (sitting) 118/87  Heartrate 111  Dyspnea (Borg Scale) 5  Fatigue (Borg Scale) 3  SPO2 98  BP (sitting) 142/90  Heartrate 92  SPO2 99  Stopped or Paused before Six Minutes No  Tech Comments: Patient ambulated at quick pace for whole 6 minutes. Patient had no complaints of shortness of breath.

## 2019-01-18 ENCOUNTER — Telehealth: Payer: Self-pay | Admitting: Internal Medicine

## 2019-01-18 NOTE — Telephone Encounter (Signed)
Spoke to [place], let her know that the patient just came in for his 6 min walk yesterday and did not desaturate. The patient does have an order for ONO, so once that is completed if the patient needs oxygen we will send the order to [place]. Nothing further needed from [place] at this time.

## 2019-01-27 ENCOUNTER — Other Ambulatory Visit: Payer: Self-pay | Admitting: Internal Medicine

## 2019-01-27 ENCOUNTER — Telehealth: Payer: Self-pay | Admitting: Internal Medicine

## 2019-01-27 NOTE — Telephone Encounter (Signed)
ONO reviewed by DR- no night time oxygen is needed.  Left message to make pt aware of this information.

## 2019-01-28 NOTE — Telephone Encounter (Signed)
Pt is aware of results and voiced his understanding. Nothing further is needed.  

## 2019-01-28 NOTE — Telephone Encounter (Signed)
Left message x2 for pt. 

## 2019-03-15 ENCOUNTER — Emergency Department: Payer: Medicare Other

## 2019-03-15 ENCOUNTER — Other Ambulatory Visit: Payer: Self-pay

## 2019-03-15 ENCOUNTER — Encounter: Payer: Self-pay | Admitting: Emergency Medicine

## 2019-03-15 ENCOUNTER — Emergency Department
Admission: EM | Admit: 2019-03-15 | Discharge: 2019-03-15 | Disposition: A | Discharge status: Home / Self Care | Payer: Medicare Other | Attending: Emergency Medicine | Admitting: Emergency Medicine

## 2019-03-15 DIAGNOSIS — J441 Chronic obstructive pulmonary disease with (acute) exacerbation: Secondary | ICD-10-CM | POA: Diagnosis not present

## 2019-03-15 DIAGNOSIS — Z79899 Other long term (current) drug therapy: Secondary | ICD-10-CM | POA: Diagnosis not present

## 2019-03-15 DIAGNOSIS — Z20828 Contact with and (suspected) exposure to other viral communicable diseases: Secondary | ICD-10-CM | POA: Diagnosis not present

## 2019-03-15 DIAGNOSIS — R0602 Shortness of breath: Secondary | ICD-10-CM | POA: Diagnosis present

## 2019-03-15 LAB — CBC
HCT: 45.4 % (ref 39.0–52.0)
Hemoglobin: 15.4 g/dL (ref 13.0–17.0)
MCH: 29.4 pg (ref 26.0–34.0)
MCHC: 33.9 g/dL (ref 30.0–36.0)
MCV: 86.8 fL (ref 80.0–100.0)
Platelets: 240 10*3/uL (ref 150–400)
RBC: 5.23 MIL/uL (ref 4.22–5.81)
RDW: 12.2 % (ref 11.5–15.5)
WBC: 17.2 10*3/uL — ABNORMAL HIGH (ref 4.0–10.5)
nRBC: 0 % (ref 0.0–0.2)

## 2019-03-15 LAB — BASIC METABOLIC PANEL
Anion gap: 8 (ref 5–15)
BUN: 12 mg/dL (ref 8–23)
CO2: 26 mmol/L (ref 22–32)
Calcium: 10.3 mg/dL (ref 8.9–10.3)
Chloride: 100 mmol/L (ref 98–111)
Creatinine, Ser: 1 mg/dL (ref 0.61–1.24)
GFR calc Af Amer: >60 mL/min (ref >60)
GFR calc non Af Amer: >60 mL/min (ref >60)
Glucose, Bld: 109 mg/dL — ABNORMAL HIGH (ref 70–99)
Potassium: 4.3 mmol/L (ref 3.5–5.1)
Sodium: 134 mmol/L — ABNORMAL LOW (ref 135–145)

## 2019-03-15 MED ORDER — SODIUM CHLORIDE 0.9 % IV BOLUS
1000.0000 mL | Freq: Once | INTRAVENOUS | Status: AC
  Administered 2019-03-15: 20:00:00 1000 mL via INTRAVENOUS

## 2019-03-15 MED ORDER — ALBUTEROL SULFATE HFA 108 (90 BASE) MCG/ACT IN AERS: 2.0000 | INHALATION_SPRAY | RESPIRATORY_TRACT | 0 refills | Status: DC | PRN

## 2019-03-15 MED ORDER — DOXYCYCLINE HYCLATE 100 MG PO CAPS: 100.0000 mg | ORAL_CAPSULE | Freq: Two times a day (BID) | ORAL | 0 refills | Status: DC

## 2019-03-15 MED ORDER — METHYLPREDNISOLONE SODIUM SUCC 125 MG IJ SOLR
125.0000 mg | Freq: Once | INTRAMUSCULAR | Status: AC
  Administered 2019-03-15: 20:00:00 125 mg via INTRAVENOUS
  Filled 2019-03-15: qty 2

## 2019-03-15 MED ORDER — ALBUTEROL SULFATE (2.5 MG/3ML) 0.083% IN NEBU
5.0000 mg | INHALATION_SOLUTION | Freq: Once | RESPIRATORY_TRACT | Status: AC
  Administered 2019-03-15: 20:00:00 5 mg via RESPIRATORY_TRACT
  Filled 2019-03-15: qty 6

## 2019-03-15 MED ORDER — ALBUTEROL SULFATE (2.5 MG/3ML) 0.083% IN NEBU: 5.0000 mg | INHALATION_SOLUTION | Freq: Once | RESPIRATORY_TRACT | Status: DC

## 2019-03-15 MED ORDER — PREDNISONE 10 MG PO TABS: ORAL_TABLET | ORAL | 0 refills | Status: AC

## 2019-03-15 MED ORDER — IPRATROPIUM-ALBUTEROL 0.5-2.5 (3) MG/3ML IN SOLN
3.0000 mL | Freq: Once | RESPIRATORY_TRACT | Status: AC
  Administered 2019-03-15: 20:00:00 3 mL via RESPIRATORY_TRACT
  Filled 2019-03-15: qty 3

## 2019-03-15 NOTE — ED Notes (Signed)
Pt called in the [place] to be taken back to a room, pt is not visualized and has not responded, will recheck in 5-10min

## 2019-03-15 NOTE — ED Provider Notes (Signed)
[facility] Emergency Department Provider Note  ____________________________________________  Time seen: Approximately 7:53 PM  I have reviewed the triage vital signs and the nursing notes.   HISTORY  Chief Complaint Shortness of Breath    HPI David Rhodes is a 63 y.o. male with a history of chronic pain, COPD, GERD, hypertension who comes the ED complaining of  shortness of breath is been gradually progressing for the past few years, followed by pulmonology for COPD and emphysema.  He reports that over the past week his symptoms have been worse associated with worsened shortness of breath, productive cough.  He also reports subjective fevers chills and body aches.  No known sick contacts.  No abdominal pain vomiting diarrhea or chest pain.  Does report that he is been compliant with his medications but ran out of his albuterol inhaler recently.  Symptoms are constant and waxing and waning.     Past Medical History:  Diagnosis Date  . Allergy    Hay fever  . Anxiety   . Asthma   . Chronic pain due to trauma   . COPD (chronic obstructive pulmonary disease) (HCC)   . Depression   . Essential tremor   . GERD (gastroesophageal reflux disease)   . Headache(784.0)    neck pain  . Hypertension   . Inguinal hernia    bilateral  . Neuromuscular disorder (HCC)    hx of tremors  . Pneumonia    many years ago  . Shortness of breath    when allergies flare up  . Urine incontinence   . Wears glasses      Patient Active Problem List   Diagnosis Date Noted  . COPD exacerbation (HCC) 12/30/2018  . Acute on chronic respiratory failure with hypoxemia (HCC) 12/25/2018  . Inguinal hernia recurrent bilateral 11/16/2018  . Chronic left shoulder pain 10/05/2018  . Tremor 08/10/2018  . PICC (peripherally inserted central catheter) in place 04/16/2018  . 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 Essential tremor 10/18/2017  . Cervical radiculopathy  07/23/2017  . Bilateral temporomandibular joint pain 07/23/2017  . Neck pain 05/19/2017  . Medication monitoring encounter 09/11/2016  . Pulmonary nodules 12/31/2015  . COPD (chronic obstructive pulmonary disease) (HCC) 12/27/2015  . Chronic cough 12/27/2015  . Asthma 09/24/2015  . Allergic rhinitis 11/11/2014  . Nerve root pain 11/11/2014  . Dyskinesia 11/11/2014  . Acid reflux 11/11/2014  . Calcium blood increased 11/11/2014  . Affective disorder (HCC) 11/11/2014  . Essential hypertension 12/25/2011  . Chronic back pain 12/25/2011     Past Surgical History:  Procedure Laterality Date  . DEEP BRAIN STIMULATOR PLACEMENT    . INGUINAL HERNIA REPAIR Bilateral 02/07/2014   Procedure:  BILATERAL INGUINAL HERNIA REPAIR;  Surgeon: Axel FillerArmando Ramirez, MD;  Location: [facility] OR;  Service: General;  Laterality: Bilateral;  . INSERTION OF MESH Bilateral 02/07/2014   Procedure: INSERTION OF MESH;  Surgeon: Axel FillerArmando Ramirez, MD;  Location: [facility] OR;  Service: General;  Laterality: Bilateral;  . NASAL SINUS SURGERY    . PULSE GENERATOR IMPLANT Left 09/11/2016   Procedure: Left chest-Change implantable pulse generator battery;  Surgeon: Maeola HarmanJoseph Stern, MD;  [facility];  Service: Neurosurgery;  Laterality: Left;  Left chest-Change implantable pulse generator battery  . PULSE GENERATOR IMPLANT Left 04/06/2018   Procedure: Revision of left chest implantable pulse generator, extension, and pocket adapter;  Surgeon: Maeola HarmanStern, Joseph, MD;  [facility];  Service: Neurosurgery;  Laterality: Left;  . SUBTHALAMIC  STIMULATOR BATTERY REPLACEMENT  12/09/2011   Procedure: SUBTHALAMIC STIMULATOR BATTERY REPLACEMENT;  Surgeon: Maeola HarmanJoseph Stern, MD;  Location: [facility] NEURO ORS;  Service: Neurosurgery;  Laterality: N/A;   deep brain stimulator, implantable pulse generator change  . SUBTHALAMIC STIMULATOR BATTERY REPLACEMENT Left 04/21/2014   Procedure: Deep brain stimulator battery change;  Surgeon: Maeola HarmanJoseph Stern, MD;  Location: [facility]  NEURO ORS;  Service: Neurosurgery;  Laterality: Left;  Deep brain stimulator battery change  . SUBTHALAMIC STIMULATOR BATTERY REPLACEMENT N/A 08/10/2018   Procedure: Replacement of deep brain stimulator pulse generator/left abdomen with long lead extensions;  Surgeon: Maeola HarmanStern, Joseph, MD;  [facility];  Service: Neurosurgery;  Laterality: N/A;     Prior to Admission medications   Medication Sig Start Date End Date Taking? Authorizing Provider  albuterol (PROVENTIL HFA) 108 (90 Base) MCG/ACT inhaler Inhale 2 puffs into the lungs every 4 (four) hours as needed for wheezing or shortness of breath. 03/15/19   Sharman CheekStafford, Lizzy Hamre, MD  albuterol (PROVENTIL HFA;VENTOLIN HFA) 108 (90 Base) MCG/ACT inhaler INHALE 1 TO 2 PUFFS BY MOUTH EVERY 6 HOURS AS NEEDED FOR WHEEZING OR SHORTNESS OF BREATH Patient taking differently: Inhale 1-2 puffs into the lungs every 6 (six) hours as needed for wheezing or shortness of breath.  07/02/18   Reubin MilanBerglund, Laura H, MD  baclofen (LIORESAL) 10 MG tablet TAKE 1 TABLET(10 MG) BY MOUTH THREE TIMES DAILY 12/05/18   Reubin MilanBerglund, Laura H, MD  BREO ELLIPTA 100-25 MCG/INH AEPB INHALE 1 PUFF INTO THE LUNGS DAILY 12/05/18   Reubin MilanBerglund, Laura H, MD  cephALEXin (KEFLEX) 500 MG capsule Take 1 capsule (500 mg total) by mouth 2 (two) times daily. 11/16/18   Ginnie SmartHatcher, Jeffrey C, MD  doxycycline (VIBRAMYCIN) 100 MG capsule Take 1 capsule (100 mg total) by mouth 2 (two) times daily. 03/15/19   Sharman CheekStafford, Ayelen Sciortino, MD  gabapentin (NEURONTIN) 600 MG tablet TAKE 1 TABLET(600 MG) BY MOUTH FOUR TIMES DAILY Patient taking differently: Take 600 mg by mouth 4 (four) times daily.  11/09/18   Reubin MilanBerglund, Laura H, MD  HYDROcodone-acetaminophen (NORCO/VICODIN) 5-325 MG tablet Take 1-2 tablets by mouth every 4 (four) hours as needed for severe pain ((score 7 to 10)). 08/11/18   Maeola HarmanStern, Joseph, MD  hydrocortisone 2.5 % lotion Apply topically 2 (two) times daily. 01/04/19   Triplett, Cari B, FNP  lisinopril (ZESTRIL) 40 MG tablet  Take 1 tablet (40 mg total) by mouth daily. 12/05/18   Reubin MilanBerglund, Laura H, MD  loratadine (CLARITIN) 10 MG tablet Take 1 tablet (10 mg total) by mouth daily. Patient taking differently: Take 10 mg by mouth daily as needed for allergies.  10/13/17   Enedina FinnerPatel, Sona, MD  meloxicam (MOBIC) 15 MG tablet TAKE 1 TABLET(15 MG) BY MOUTH DAILY 12/06/18   Reubin MilanBerglund, Laura H, MD  montelukast (SINGULAIR) 10 MG tablet TAKE 1 TABLET(10 MG) BY MOUTH AT BEDTIME Patient taking differently: Take 10 mg by mouth at bedtime.  12/05/18   Reubin MilanBerglund, Laura H, MD  mupirocin ointment (BACTROBAN) 2 % Apply to affected area 3 times daily 01/04/19 01/04/20  Triplett, Rulon Eisenmengerari B, FNP  omeprazole (PRILOSEC) 20 MG capsule TAKE 1 CAPSULE BY MOUTH DAILY Patient taking differently: Take 20 mg by mouth daily.  12/05/18   Reubin MilanBerglund, Laura H, MD  predniSONE (DELTASONE) 10 MG tablet Take 5 tablets (50 mg total) by mouth daily for 3 days, THEN 4 tablets (40 mg total) daily for 3 days, THEN 3 tablets (30 mg total) daily for 3 days, THEN 2 tablets (20 mg total)  daily for 3 days, THEN 1 tablet (10 mg total) daily for 3 days. 03/15/19 03/30/19  Sharman Cheek, MD  rOPINIRole (REQUIP) 2 MG tablet TAKE 1 TABLET BY MOUTH TWICE DAILY Patient taking differently: Take 2 mg by mouth 2 (two) times daily.  10/10/18   Reubin Milan, MD  SPIRIVA HANDIHALER 18 MCG inhalation capsule INHALE CONTENTS OF 1 CAPSULE ONCE DAILY USING HANDIHALER Patient taking differently: Place 18 mcg into inhaler and inhale daily.  10/02/18   Reubin Milan, MD     Allergies Penicillins   Family History  Problem Relation Age of Onset  . Myoclonus Mother   . Breast cancer Mother   . Prostate cancer Father   . Heart disease Paternal Grandfather   . Myoclonus Maternal Uncle     Social History Social History   Tobacco Use  . Smoking status: Never Smoker  . Smokeless tobacco: Current User    Types: Chew  Substance Use Topics  . Alcohol use: No    Alcohol/week: 0.0 standard  drinks  . Drug use: No    Review of Systems  Constitutional: Positive fever and chills.  ENT:   No sore throat. No rhinorrhea. Cardiovascular:   No chest pain or syncope. Respiratory:   Positive shortness of breath and productive cough. Gastrointestinal:   Negative for abdominal pain, vomiting and diarrhea.  Musculoskeletal:   Negative for focal pain or swelling.  Positive for body aches. All other systems reviewed and are negative except as documented above in ROS and HPI.  ____________________________________________   PHYSICAL EXAM:  VITAL SIGNS: ED Triage Vitals  Enc Vitals Group     BP 03/15/19 1641 (!) 159/122     Pulse Rate 03/15/19 1641 (!) 101     Resp 03/15/19 1641 19     Temp 03/15/19 1641 99.1 F (37.3 C)     Temp Source 03/15/19 1641 Oral     SpO2 03/15/19 1641 96 %     Weight 03/15/19 1641 148 lb (67.1 kg)     Height 03/15/19 1641 6' (1.829 m)     Head Circumference --      Peak Flow --      Pain Score 03/15/19 1647 6     Pain Loc --      Pain Edu? --      Excl. in GC? --     Vital signs reviewed, nursing assessments reviewed.   Constitutional:   Alert and oriented. Non-toxic appearance. Eyes:   Conjunctivae are normal. EOMI. PERRL. ENT      Head:   Normocephalic and atraumatic.      Nose:   No congestion/rhinnorhea.       Mouth/Throat:   MMM, no pharyngeal erythema. No peritonsillar mass.       Neck:   No meningismus. Full ROM. Hematological/Lymphatic/Immunilogical:   No cervical lymphadenopathy. Cardiovascular:   Tachycardia heart rate 110. Symmetric bilateral radial and DP pulses.  No murmurs. Cap refill less than 2 seconds. Respiratory: Tachypnea, diffuse expiratory wheezing, prolonged expiratory phase, worsened with FEV1 maneuver. Gastrointestinal:   Soft and nontender. Non distended. There is no CVA tenderness.  No rebound, rigidity, or guarding.  Musculoskeletal:   Normal range of motion in all extremities. No joint effusions.  No lower  extremity tenderness.  No edema. Neurologic:   Normal speech and language.  Motor grossly intact. No acute focal neurologic deficits are appreciated.  Skin:    Skin is warm, dry and intact. No rash noted.  No  petechiae, purpura, or bullae.  ____________________________________________    LABS (pertinent positives/negatives) (all labs ordered are listed, but only abnormal results are displayed) Labs Reviewed  BASIC METABOLIC PANEL - Abnormal; Notable for the following components:      Result Value   Sodium 134 (*)    Glucose, Bld 109 (*)    All other components within normal limits  CBC - Abnormal; Notable for the following components:   WBC 17.2 (*)    All other components within normal limits  SARS CORONAVIRUS 2 (TAT 6-24 HRS)   ____________________________________________   EKG  Interpreted by me Sinus tachycardia rate 106, normal axis and intervals.  Normal QRS complexes.  Normal ST segments and T waves.  ____________________________________________    RADIOLOGY  Dg Chest Portable 1 View  Result Date: 03/15/2019 CLINICAL DATA:  Acute shortness of breath. EXAM: PORTABLE CHEST 1 VIEW COMPARISON:  12/31/2018 and prior radiographs FINDINGS: The patient is rotated to the RIGHT. Cardiomediastinal silhouette appears unchanged given rotation. Mild peribronchial thickening again noted. There is no evidence of focal airspace disease, pulmonary edema, suspicious pulmonary nodule/mass, pleural effusion, or pneumothorax. No acute bony abnormalities are identified. IMPRESSION: No evidence of acute cardiopulmonary disease. Electronically Signed   By: Harmon Pier M.D.   On: 03/15/2019 19:44    ____________________________________________   PROCEDURES Procedures  ____________________________________________  DIFFERENTIAL DIAGNOSIS   COPD exacerbation, pneumothorax, pneumonia, COVID-19.  Doubt ACS PE dissection carditis.  CLINICAL IMPRESSION / ASSESSMENT AND PLAN / ED  COURSE  Medications ordered in the ED: Medications  methylPREDNISolone sodium succinate (SOLU-MEDROL) 125 mg/2 mL injection 125 mg (125 mg Intravenous Given 03/15/19 1954)  ipratropium-albuterol (DUONEB) 0.5-2.5 (3) MG/3ML nebulizer solution 3 mL (3 mLs Nebulization Given 03/15/19 1955)  albuterol (PROVENTIL) (2.5 MG/3ML) 0.083% nebulizer solution 5 mg (5 mg Nebulization Given 03/15/19 1955)  sodium chloride 0.9 % bolus 1,000 mL (0 mLs Intravenous Stopped 03/15/19 2111)    Pertinent labs & imaging results that were available during my care of the patient were reviewed by me and considered in my medical decision making (see chart for details).  David Rhodes was evaluated in Emergency Department on 03/15/2019 for the symptoms described in the history of present illness. He was evaluated in the context of the global COVID-19 pandemic, which necessitated consideration that the patient might be at risk for infection with the SARS-CoV-2 virus that causes COVID-19. Institutional protocols and algorithms that pertain to the evaluation of patients at risk for COVID-19 are in a state of rapid change based on information released by regulatory bodies including the CDC and federal and state organizations. These policies and algorithms were followed during the patient's care in the ED.   Patient presents with acute on chronic shortness of breath associated with multiple constitutional symptoms and productive cough.  Will give steroids, bronchodilators and IV fluids for symptomatic relief and to treat his tachycardia.  Check a chest x-ray labs and COVID test.  With his baseline emphysema, he will need to be treated empirically for pneumonia.  He is not septic.  Clinical Course as of Mar 14 2240  Tue Mar 15, 2019  1950 Cxr shows no acute findings. Image viewed by me, rad interpretation reviewed.    [PS]    Clinical Course User Index [PS] Sharman Cheek, MD      ----------------------------------------- 10:41 PM on 03/15/2019 -----------------------------------------  Patient feeling much better, ambulatory in the room.  Wheezing improved, tachycardia improved, oxygenation remained stable and within normal considering his chronic lung  disease.  Stable for discharge home and outpatient follow-up, I will start him on doxycycline and prednisone taper for now.  ____________________________________________   FINAL CLINICAL IMPRESSION(S) / ED DIAGNOSES    Final diagnoses:  COPD exacerbation [facility])     ED Discharge Orders         Ordered    doxycycline (VIBRAMYCIN) 100 MG capsule  2 times daily     03/15/19 2241    albuterol (PROVENTIL HFA) 108 (90 Base) MCG/ACT inhaler  Every 4 hours PRN     03/15/19 2241    predniSONE (DELTASONE) 10 MG tablet     03/15/19 2241          Portions of this note were generated with dragon dictation software. Dictation errors may occur despite best attempts at proofreading.   Carrie Mew, MD 03/15/19 2241

## 2019-03-15 NOTE — ED Triage Notes (Signed)
Pt via pov from home with sob that has been going on for 2 years, worsening in last few days. Pt has copd and has exacerbations periodically. Pt alert & oriented, speaking breathlessly, obviously sob. NAD noted.

## 2019-03-15 NOTE — ED Notes (Signed)
Knife returned to patient at discharge

## 2019-03-15 NOTE — ED Notes (Signed)
Pt states he was walking to his car to get his coat and wallet and would be back,.

## 2019-03-15 NOTE — ED Notes (Signed)
Pt returned to the [facility]>

## 2019-03-16 ENCOUNTER — Telehealth: Payer: Self-pay | Admitting: Internal Medicine

## 2019-03-16 LAB — SARS CORONAVIRUS 2 (TAT 6-24 HRS): SARS Coronavirus 2: NEGATIVE

## 2019-03-16 NOTE — Telephone Encounter (Signed)
Negative COVID results given. Patient results "NOT Detected." Caller expressed understanding. ° °

## 2019-03-18 ENCOUNTER — Telehealth: Payer: Self-pay

## 2019-03-18 ENCOUNTER — Other Ambulatory Visit: Payer: Self-pay

## 2019-03-18 NOTE — Telephone Encounter (Signed)
Called and spoke with patient who stated that he couldn't come for the COVID test on Monday and then come back on Tues for the test. Per patient request cancel the PFT and I advised the patient to call us if he needed to be seen. Pt voiced understanding and appointment will be canceled. Nothing else needed at this time. Rhonda J Cobb

## 2019-03-18 NOTE — Telephone Encounter (Signed)
Left message to relay date/time for covid test prior to PFT.  03/21/2019 [facility]

## 2019-03-18 NOTE — Telephone Encounter (Signed)
Called and spoke to pt, and relayed below date/time for covid test. Pt had covid test on 03/15/2019 during ED visit, which was negative.  Pt stated that he can not come back for another covid test.  I have spoken to [facility] with cardiopulmonary, who confirmed that test has to be within 3 days of PFT.  Pt is requesting to cancel PFT, due to not being able to come back for covid test.  Suanne Marker, can you help with this. Thanks

## 2019-03-21 ENCOUNTER — Other Ambulatory Visit: Payer: Self-pay

## 2019-03-21 DIAGNOSIS — J449 Chronic obstructive pulmonary disease, unspecified: Secondary | ICD-10-CM | POA: Diagnosis not present

## 2019-03-21 DIAGNOSIS — Y999 Unspecified external cause status: Secondary | ICD-10-CM | POA: Insufficient documentation

## 2019-03-21 DIAGNOSIS — Y93H2 Activity, gardening and landscaping: Secondary | ICD-10-CM | POA: Insufficient documentation

## 2019-03-21 DIAGNOSIS — X509XXA Other and unspecified overexertion or strenuous movements or postures, initial encounter: Secondary | ICD-10-CM | POA: Diagnosis not present

## 2019-03-21 DIAGNOSIS — S6991XA Unspecified injury of right wrist, hand and finger(s), initial encounter: Secondary | ICD-10-CM | POA: Diagnosis present

## 2019-03-21 DIAGNOSIS — S63601A Unspecified sprain of right thumb, initial encounter: Secondary | ICD-10-CM | POA: Diagnosis not present

## 2019-03-21 DIAGNOSIS — I10 Essential (primary) hypertension: Secondary | ICD-10-CM | POA: Diagnosis not present

## 2019-03-21 DIAGNOSIS — Z79899 Other long term (current) drug therapy: Secondary | ICD-10-CM | POA: Diagnosis not present

## 2019-03-21 DIAGNOSIS — J45909 Unspecified asthma, uncomplicated: Secondary | ICD-10-CM | POA: Diagnosis not present

## 2019-03-21 DIAGNOSIS — F17210 Nicotine dependence, cigarettes, uncomplicated: Secondary | ICD-10-CM | POA: Insufficient documentation

## 2019-03-21 DIAGNOSIS — Y92017 Garden or yard in single-family (private) house as the place of occurrence of the external cause: Secondary | ICD-10-CM | POA: Insufficient documentation

## 2019-03-21 NOTE — ED Triage Notes (Signed)
Pt in with co right thumb pain since this morning while trying to move a tree branch. No obvious deformity noted at this time.

## 2019-03-22 ENCOUNTER — Ambulatory Visit: Payer: Medicare Other

## 2019-03-22 ENCOUNTER — Emergency Department
Admission: EM | Admit: 2019-03-22 | Discharge: 2019-03-22 | Disposition: A | Discharge status: Home / Self Care | Payer: Medicare Other | Attending: Emergency Medicine | Admitting: Emergency Medicine

## 2019-03-22 ENCOUNTER — Emergency Department: Payer: Medicare Other

## 2019-03-22 DIAGNOSIS — S63601A Unspecified sprain of right thumb, initial encounter: Secondary | ICD-10-CM | POA: Diagnosis not present

## 2019-03-22 NOTE — ED Provider Notes (Signed)
[facility] Emergency Department Provider Note  ____________________________________________   First MD Initiated Contact with Patient 03/22/19 [phone]     (approximate)  I have reviewed the triage vital signs and the nursing notes.   HISTORY  Chief Complaint Hand Pain    HPI David Rhodes is a 63 y.o. male with medical issues as listed below who presents for evaluation of acute onset pain and some swelling in his right thumb.  He reports he was doing some work outside and pulling on a tree branch when he felt his thumb began to hurt.  He has some swelling in the middle of the thumb.  He is still able to extend it but flexing it hurts.  He did not pierce the skin and has no cuts.  No trauma, just pain after pulling on the tree branch.   He is right-hand dominant.  He has some chronic issues due to a prior trauma to the other fingers but not to his thumb.        Past Medical History:  Diagnosis Date  . Allergy    Hay fever  . Anxiety   . Asthma   . Chronic pain due to trauma   . COPD (chronic obstructive pulmonary disease) (HCC)   . Depression   . Essential tremor   . GERD (gastroesophageal reflux disease)   . Headache(784.0)    neck pain  . Hypertension   . Inguinal hernia    bilateral  . Neuromuscular disorder (HCC)    hx of tremors  . Pneumonia    many years ago  . Shortness of breath    when allergies flare up  . Urine incontinence   . Wears glasses     Patient Active Problem List   Diagnosis Date Noted  . COPD exacerbation (HCC) 12/30/2018  . Acute on chronic respiratory failure with hypoxemia (HCC) 12/25/2018  . Inguinal hernia recurrent bilateral 11/16/2018  . Chronic left shoulder pain 10/05/2018  . Tremor 08/10/2018  . PICC (peripherally inserted central catheter) in place 04/16/2018  . 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 Essential tremor 10/18/2017  . Cervical radiculopathy 07/23/2017  . Bilateral  temporomandibular joint pain 07/23/2017  . Neck pain 05/19/2017  . Medication monitoring encounter 09/11/2016  . Pulmonary nodules 12/31/2015  . COPD (chronic obstructive pulmonary disease) (HCC) 12/27/2015  . Chronic cough 12/27/2015  . Asthma 09/24/2015  . Allergic rhinitis 11/11/2014  . Nerve root pain 11/11/2014  . Dyskinesia 11/11/2014  . Acid reflux 11/11/2014  . Calcium blood increased 11/11/2014  . Affective disorder (HCC) 11/11/2014  . Essential hypertension 12/25/2011  . Chronic back pain 12/25/2011    Past Surgical History:  Procedure Laterality Date  . DEEP BRAIN STIMULATOR PLACEMENT    . INGUINAL HERNIA REPAIR Bilateral 02/07/2014   Procedure:  BILATERAL INGUINAL HERNIA REPAIR;  Surgeon: Axel FillerArmando Ramirez, MD;  Location: [facility] OR;  Service: General;  Laterality: Bilateral;  . INSERTION OF MESH Bilateral 02/07/2014   Procedure: INSERTION OF MESH;  Surgeon: Axel FillerArmando Ramirez, MD;  Location: [facility] OR;  Service: General;  Laterality: Bilateral;  . NASAL SINUS SURGERY    . PULSE GENERATOR IMPLANT Left 09/11/2016   Procedure: Left chest-Change implantable pulse generator battery;  Surgeon: Maeola HarmanJoseph Stern, MD;  [facility];  Service: Neurosurgery;  Laterality: Left;  Left chest-Change implantable pulse generator battery  . PULSE GENERATOR IMPLANT Left 04/06/2018   Procedure: Revision of left chest implantable pulse generator, extension, and pocket adapter;  Surgeon: Erline Levine, MD;  Location: [place];  Service: Neurosurgery;  Laterality: Left;  . SUBTHALAMIC STIMULATOR BATTERY REPLACEMENT  12/09/2011   Procedure: SUBTHALAMIC STIMULATOR BATTERY REPLACEMENT;  Surgeon: Erline Levine, MD;  Location: [place] [facility];  Service: Neurosurgery;  Laterality: N/A;   deep brain stimulator, implantable pulse generator change  . SUBTHALAMIC STIMULATOR BATTERY REPLACEMENT Left 04/21/2014   Procedure: Deep brain stimulator battery change;  Surgeon: Erline Levine, MD;  Location: [place] [facility];  Service:  Neurosurgery;  Laterality: Left;  Deep brain stimulator battery change  . SUBTHALAMIC STIMULATOR BATTERY REPLACEMENT N/A 08/10/2018   Procedure: Replacement of deep brain stimulator pulse generator/left abdomen with long lead extensions;  Surgeon: Erline Levine, MD;  Location: [place];  Service: Neurosurgery;  Laterality: N/A;    Prior to Admission medications   Medication Sig Start Date End Date Taking? Authorizing Provider  albuterol (PROVENTIL HFA) 108 (90 Base) MCG/ACT inhaler Inhale 2 puffs into the lungs every 4 (four) hours as needed for wheezing or shortness of breath. 03/15/19   Carrie Mew, MD  albuterol (PROVENTIL HFA;VENTOLIN HFA) 108 (90 Base) MCG/ACT inhaler INHALE 1 TO 2 PUFFS BY MOUTH EVERY 6 HOURS AS NEEDED FOR WHEEZING OR SHORTNESS OF BREATH Patient taking differently: Inhale 1-2 puffs into the lungs every 6 (six) hours as needed for wheezing or shortness of breath.  07/02/18   Glean Hess, MD  baclofen (LIORESAL) 10 MG tablet TAKE 1 TABLET(10 MG) BY MOUTH THREE TIMES DAILY 12/05/18   Glean Hess, MD  BREO ELLIPTA 100-25 MCG/INH AEPB INHALE 1 PUFF INTO THE LUNGS DAILY 12/05/18   Glean Hess, MD  cephALEXin (KEFLEX) 500 MG capsule Take 1 capsule (500 mg total) by mouth 2 (two) times daily. 11/16/18   Campbell Riches, MD  doxycycline (VIBRAMYCIN) 100 MG capsule Take 1 capsule (100 mg total) by mouth 2 (two) times daily. 03/15/19   Carrie Mew, MD  gabapentin (NEURONTIN) 600 MG tablet TAKE 1 TABLET(600 MG) BY MOUTH FOUR TIMES DAILY Patient taking differently: Take 600 mg by mouth 4 (four) times daily.  11/09/18   Glean Hess, MD  HYDROcodone-acetaminophen (NORCO/VICODIN) 5-325 MG tablet Take 1-2 tablets by mouth every 4 (four) hours as needed for severe pain ((score 7 to 10)). 08/11/18   Erline Levine, MD  hydrocortisone 2.5 % lotion Apply topically 2 (two) times daily. 01/04/19   Triplett, Cari B, FNP  lisinopril (ZESTRIL) 40 MG tablet Take 1 tablet (40 mg  total) by mouth daily. 12/05/18   Glean Hess, MD  loratadine (CLARITIN) 10 MG tablet Take 1 tablet (10 mg total) by mouth daily. Patient taking differently: Take 10 mg by mouth daily as needed for allergies.  10/13/17   Fritzi Mandes, MD  meloxicam (MOBIC) 15 MG tablet TAKE 1 TABLET(15 MG) BY MOUTH DAILY 12/06/18   Glean Hess, MD  montelukast (SINGULAIR) 10 MG tablet TAKE 1 TABLET(10 MG) BY MOUTH AT BEDTIME Patient taking differently: Take 10 mg by mouth at bedtime.  12/05/18   Glean Hess, MD  mupirocin ointment (BACTROBAN) 2 % Apply to affected area 3 times daily 01/04/19 01/04/20  Triplett, Johnette Abraham B, FNP  omeprazole (PRILOSEC) 20 MG capsule TAKE 1 CAPSULE BY MOUTH DAILY Patient taking differently: Take 20 mg by mouth daily.  12/05/18   Glean Hess, MD  predniSONE (DELTASONE) 10 MG tablet Take 5 tablets (50 mg total) by mouth daily for 3 days, THEN 4 tablets (40 mg total) daily for 3 days,  THEN 3 tablets (30 mg total) daily for 3 days, THEN 2 tablets (20 mg total) daily for 3 days, THEN 1 tablet (10 mg total) daily for 3 days. 03/15/19 03/30/19  Sharman Cheek, MD  rOPINIRole (REQUIP) 2 MG tablet TAKE 1 TABLET BY MOUTH TWICE DAILY Patient taking differently: Take 2 mg by mouth 2 (two) times daily.  10/10/18   Reubin Milan, MD  SPIRIVA HANDIHALER 18 MCG inhalation capsule INHALE CONTENTS OF 1 CAPSULE ONCE DAILY USING HANDIHALER Patient taking differently: Place 18 mcg into inhaler and inhale daily.  10/02/18   Reubin Milan, MD    Allergies Penicillins  Family History  Problem Relation Age of Onset  . Myoclonus Mother   . Breast cancer Mother   . Prostate cancer Father   . Heart disease Paternal Grandfather   . Myoclonus Maternal Uncle     Social History Social History   Tobacco Use  . Smoking status: Never Smoker  . Smokeless tobacco: Current User    Types: Chew  Substance Use Topics  . Alcohol use: No    Alcohol/week: 0.0 standard drinks  . Drug use: No     Review of Systems Constitutional: No fever/chills Cardiovascular: Denies chest pain. Respiratory: Denies shortness of breath. Musculoskeletal: Pain and swelling in the right thumb. Integumentary: Negative for rash. Neurological: Negative for headaches, focal weakness or numbness.   ____________________________________________   PHYSICAL EXAM:  VITAL SIGNS: ED Triage Vitals  Enc Vitals Group     BP 03/21/19 2356 (!) 176/116     Pulse Rate 03/21/19 2356 91     Resp 03/21/19 2356 20     Temp 03/21/19 2356 98.3 F (36.8 C)     Temp Source 03/21/19 2356 Oral     SpO2 03/21/19 2356 98 %     Weight 03/21/19 2357 71.7 kg (158 lb)     Height 03/21/19 2357 1.829 m (6')     Head Circumference --      Peak Flow --      Pain Score 03/21/19 2357 3     Pain Loc --      Pain Edu? --      Excl. in GC? --     Constitutional: Alert and oriented.  Disheveled but not in acute distress. Eyes: Conjunctivae are normal.  Head: Atraumatic. Cardiovascular: Normal rate, regular rhythm. Good peripheral circulation Respiratory: Normal respiratory effort.  No retractions. Musculoskeletal: The patient has some mild swelling around the interphalangeal joint of the right thumb.  There is tenderness to palpation.  He is able to extend but range of motion with flexion is limited due to pain.  Neurovascularly intact. Neurologic:  Normal speech and language. No gross focal neurologic deficits are appreciated.  Skin:  Skin is warm, dry and intact. Psychiatric: Mood and affect are normal. Speech and behavior are normal.  ____________________________________________   LABS (all labs ordered are listed, but only abnormal results are displayed)  Labs Reviewed - No data to display ____________________________________________  EKG  No indication for EKG ____________________________________________  RADIOLOGY I, Loleta Rose, personally viewed and evaluated these images (plain radiographs) as part of my  medical decision making, as well as reviewing the written report by the radiologist.  ED MD interpretation: No fracture or dislocation  Official radiology report(s): Dg Finger Thumb Right  Result Date: 03/22/2019 CLINICAL DATA:  Right thumb pain after injury. EXAM: RIGHT THUMB 2+V COMPARISON:  None. FINDINGS: There is no evidence of fracture or dislocation. Prominent osteoarthritis at  the interphalangeal joint with large osteophytes. Lesser arthritis at the thumb metacarpal phalangeal joint. Soft tissues are unremarkable. IMPRESSION: No acute osseous abnormality. 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 Electronically Signed   By: Narda Rutherford M.D.   On: 03/22/2019 00:34    ____________________________________________   PROCEDURES   Procedure(s) performed (including Critical Care):  Procedures   ____________________________________________   INITIAL IMPRESSION / MDM / ASSESSMENT AND PLAN / ED COURSE  As part of my medical decision making, I reviewed the following data within the electronic MEDICAL RECORD NUMBER Nursing notes reviewed and incorporated, Old chart reviewed, Radiograph reviewed  and Notes from prior ED visits   Probable thumb sprain, no evidence of dislocation or fracture.  I provided a removable thumb spica splint and gave my usual conservative management recommendations.  I strongly encouraged him to follow-up with Dr. Mathis Bud, the hand specialist, by calling in the morning to schedule a follow-up appointment for Thursday.  He understands and agrees with the plan.          ____________________________________________  FINAL CLINICAL IMPRESSION(S) / ED DIAGNOSES  Final diagnoses:  Sprain of right thumb, initial encounter     MEDICATIONS GIVEN DURING THIS VISIT:  Medications - No data to display   ED Discharge Orders    None      *Please note:  MATTTHEW KAWECKI was evaluated in Emergency Department on 03/22/2019 for the symptoms  described in the history of present illness. He was evaluated in the context of the global COVID-19 pandemic, which necessitated consideration that the patient might be at risk for infection with the SARS-CoV-2 virus that causes COVID-19. Institutional protocols and algorithms that pertain to the evaluation of patients at risk for COVID-19 are in a state of rapid change based on information released by regulatory bodies including the CDC and federal and state organizations. These policies and algorithms were followed during the patient's care in the ED.  Some ED evaluations and interventions may be delayed as a result of limited staffing during the pandemic.*  Note:  This document was prepared using Dragon voice recognition software and may include unintentional dictation errors.   Loleta Rose, MD 03/22/19 [phone]

## 2019-03-22 NOTE — Discharge Instructions (Signed)
As we discussed, we believe your pain is due to a sprain of your thumb.  I recommend that you use the immobilization device as much as possible and follow-up with the hand specialist listed in this paperwork.  You need to call in the morning to her clinic to schedule a follow-up appointment with Dr. Jackqulyn Livings.  She typically has office hours on Thursday.  Use over-the-counter pain medicine such as ibuprofen and Tylenol as needed according to label instructions.  You can also use cold packs through the splint.  Return to the emergency department if you develop new or worsening symptoms that concern you.

## 2019-03-31 ENCOUNTER — Ambulatory Visit (INDEPENDENT_AMBULATORY_CARE_PROVIDER_SITE_OTHER): Payer: Medicare HMO

## 2019-03-31 ENCOUNTER — Ambulatory Visit
Admission: EM | Admit: 2019-03-31 | Discharge: 2019-03-31 | Disposition: A | Discharge status: Home / Self Care | Payer: Medicare HMO | Attending: Internal Medicine | Admitting: Internal Medicine

## 2019-03-31 ENCOUNTER — Other Ambulatory Visit: Payer: Self-pay

## 2019-03-31 DIAGNOSIS — S61532A Puncture wound without foreign body of left wrist, initial encounter: Secondary | ICD-10-CM

## 2019-03-31 DIAGNOSIS — R0602 Shortness of breath: Secondary | ICD-10-CM | POA: Diagnosis not present

## 2019-03-31 DIAGNOSIS — J441 Chronic obstructive pulmonary disease with (acute) exacerbation: Secondary | ICD-10-CM

## 2019-03-31 DIAGNOSIS — Z23 Encounter for immunization: Secondary | ICD-10-CM

## 2019-03-31 DIAGNOSIS — J189 Pneumonia, unspecified organism: Secondary | ICD-10-CM

## 2019-03-31 LAB — CBC WITH DIFFERENTIAL/PLATELET
Abs Immature Granulocytes: 0.11 10*3/uL — ABNORMAL HIGH (ref 0.00–0.07)
Basophils Absolute: 0.1 10*3/uL (ref 0.0–0.1)
Basophils Relative: 1 %
Eosinophils Absolute: 0.2 10*3/uL (ref 0.0–0.5)
Eosinophils Relative: 1 %
HCT: 44.2 % (ref 39.0–52.0)
Hemoglobin: 15 g/dL (ref 13.0–17.0)
Immature Granulocytes: 1 %
Lymphocytes Relative: 9 %
Lymphs Abs: 1.9 10*3/uL (ref 0.7–4.0)
MCH: 29.8 pg (ref 26.0–34.0)
MCHC: 33.9 g/dL (ref 30.0–36.0)
MCV: 87.7 fL (ref 80.0–100.0)
Monocytes Absolute: 1.8 10*3/uL — ABNORMAL HIGH (ref 0.1–1.0)
Monocytes Relative: 8 %
Neutro Abs: 17.7 10*3/uL — ABNORMAL HIGH (ref 1.7–7.7)
Neutrophils Relative %: 80 %
Platelets: 228 10*3/uL (ref 150–400)
RBC: 5.04 MIL/uL (ref 4.22–5.81)
RDW: 13.1 % (ref 11.5–15.5)
WBC: 21.8 10*3/uL — ABNORMAL HIGH (ref 4.0–10.5)
nRBC: 0 % (ref 0.0–0.2)

## 2019-03-31 MED ORDER — PREDNISONE 10 MG (21) PO TBPK: ORAL_TABLET | Freq: Every day | ORAL | 0 refills | Status: DC

## 2019-03-31 MED ORDER — CEFDINIR 300 MG PO CAPS: 300.0000 mg | ORAL_CAPSULE | Freq: Two times a day (BID) | ORAL | 0 refills | Status: AC

## 2019-03-31 MED ORDER — DOXYCYCLINE HYCLATE 100 MG PO CAPS: 100.0000 mg | ORAL_CAPSULE | Freq: Two times a day (BID) | ORAL | 0 refills | Status: AC

## 2019-03-31 MED ORDER — TETANUS-DIPHTH-ACELL PERTUSSIS 5-2.5-18.5 LF-MCG/0.5 IM SUSP
0.5000 mL | Freq: Once | INTRAMUSCULAR | Status: AC
  Administered 2019-03-31: 0.5 mL via INTRAMUSCULAR

## 2019-03-31 NOTE — ED Provider Notes (Signed)
[facility]    CSN: [id number] Arrival date & time: 03/31/19  1542      History   Chief Complaint Chief Complaint  Patient presents with  . Puncture Wound    left wrist  . Shortness of Breath    HPI David BienenstockRonald E Barrantes is a 63 y.o. male with a history of COPD, hypertension comes to urgent care with complaints of worsening shortness of breath over the past several days.  Patient says symptoms started insidiously and is gotten progressively worse.   Shortness of breath was worse with exertion.  No relieving factors.  Patient denied any wheezing.  He has a cough which is productive of yellowish sputum.  He denies any fever or chills.  He also complains about a puncture wound on the left wrist.  He does not remember when his last tetanus shot was.  No dizziness, near syncope or syncopal episodes.  No nausea or vomiting.  HPI  Past Medical History:  Diagnosis Date  . Allergy    Hay fever  . Anxiety   . Asthma   . Chronic pain due to trauma   . COPD (chronic obstructive pulmonary disease) (HCC)   . Depression   . Essential tremor   . GERD (gastroesophageal reflux disease)   . Headache(784.0)    neck pain  . Hypertension   . Inguinal hernia    bilateral  . Neuromuscular disorder (HCC)    hx of tremors  . Pneumonia    many years ago  . Shortness of breath    when allergies flare up  . Urine incontinence   . Wears glasses     Patient Active Problem List   Diagnosis Date Noted  . COPD exacerbation (HCC) 12/30/2018  . Acute on chronic respiratory failure with hypoxemia (HCC) 12/25/2018  . Inguinal hernia recurrent bilateral 11/16/2018  . Chronic left shoulder pain 10/05/2018  . Tremor 08/10/2018  . PICC (peripherally inserted central catheter) in place 04/16/2018  . 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 Essential tremor 10/18/2017  . Cervical radiculopathy 07/23/2017  . Bilateral temporomandibular joint pain 07/23/2017  . Neck pain 05/19/2017  .  Medication monitoring encounter 09/11/2016  . Pulmonary nodules 12/31/2015  . COPD (chronic obstructive pulmonary disease) (HCC) 12/27/2015  . Chronic cough 12/27/2015  . Asthma 09/24/2015  . Allergic rhinitis 11/11/2014  . Nerve root pain 11/11/2014  . Dyskinesia 11/11/2014  . Acid reflux 11/11/2014  . Calcium blood increased 11/11/2014  . Affective disorder (HCC) 11/11/2014  . Essential hypertension 12/25/2011  . Chronic back pain 12/25/2011    Past Surgical History:  Procedure Laterality Date  . DEEP BRAIN STIMULATOR PLACEMENT    . INGUINAL HERNIA REPAIR Bilateral 02/07/2014   Procedure:  BILATERAL INGUINAL HERNIA REPAIR;  Surgeon: Axel FillerArmando Ramirez, MD;  Location: [facility] OR;  Service: General;  Laterality: Bilateral;  . INSERTION OF MESH Bilateral 02/07/2014   Procedure: INSERTION OF MESH;  Surgeon: Axel FillerArmando Ramirez, MD;  Location: [facility] OR;  Service: General;  Laterality: Bilateral;  . NASAL SINUS SURGERY    . PULSE GENERATOR IMPLANT Left 09/11/2016   Procedure: Left chest-Change implantable pulse generator battery;  Surgeon: Maeola HarmanJoseph Stern, MD;  [facility];  Service: Neurosurgery;  Laterality: Left;  Left chest-Change implantable pulse generator battery  . PULSE GENERATOR IMPLANT Left 04/06/2018   Procedure: Revision of left chest implantable pulse generator, extension, and pocket adapter;  Surgeon: Maeola HarmanStern, Joseph, MD;  [facility];  Service: Neurosurgery;  Laterality: Left;  .  SUBTHALAMIC STIMULATOR BATTERY REPLACEMENT  12/09/2011   Procedure: SUBTHALAMIC STIMULATOR BATTERY REPLACEMENT;  Surgeon: Maeola Harman, MD;  Location: [facility];  Service: Neurosurgery;  Laterality: N/A;   deep brain stimulator, implantable pulse generator change  . SUBTHALAMIC STIMULATOR BATTERY REPLACEMENT Left 04/21/2014   Procedure: Deep brain stimulator battery change;  Surgeon: Maeola Harman, MD;  Location: [facility];  Service: Neurosurgery;  Laterality: Left;  Deep brain stimulator battery change  .  SUBTHALAMIC STIMULATOR BATTERY REPLACEMENT N/A 08/10/2018   Procedure: Replacement of deep brain stimulator pulse generator/left abdomen with long lead extensions;  Surgeon: Maeola Harman, MD;  Location: [facility] OR;  Service: Neurosurgery;  Laterality: N/A;       Home Medications    Prior to Admission medications   Medication Sig Start Date End Date Taking? Authorizing Provider  albuterol (PROVENTIL HFA) 108 (90 Base) MCG/ACT inhaler Inhale 2 puffs into the lungs every 4 (four) hours as needed for wheezing or shortness of breath. 03/15/19  Yes Sharman Cheek, MD  albuterol (PROVENTIL HFA;VENTOLIN HFA) 108 (90 Base) MCG/ACT inhaler INHALE 1 TO 2 PUFFS BY MOUTH EVERY 6 HOURS AS NEEDED FOR WHEEZING OR SHORTNESS OF BREATH Patient taking differently: Inhale 1-2 puffs into the lungs every 6 (six) hours as needed for wheezing or shortness of breath.  07/02/18  Yes Reubin Milan, MD  baclofen (LIORESAL) 10 MG tablet TAKE 1 TABLET(10 MG) BY MOUTH THREE TIMES DAILY 12/05/18  Yes Reubin Milan, MD  BREO ELLIPTA 100-25 MCG/INH AEPB INHALE 1 PUFF INTO THE LUNGS DAILY 12/05/18  Yes Reubin Milan, MD  gabapentin (NEURONTIN) 600 MG tablet TAKE 1 TABLET(600 MG) BY MOUTH FOUR TIMES DAILY Patient taking differently: Take 600 mg by mouth 4 (four) times daily.  11/09/18  Yes Reubin Milan, MD  HYDROcodone-acetaminophen (NORCO/VICODIN) 5-325 MG tablet Take 1-2 tablets by mouth every 4 (four) hours as needed for severe pain ((score 7 to 10)). 08/11/18  Yes Maeola Harman, MD  hydrocortisone 2.5 % lotion Apply topically 2 (two) times daily. 01/04/19  Yes Triplett, Cari B, FNP  lisinopril (ZESTRIL) 40 MG tablet Take 1 tablet (40 mg total) by mouth daily. 12/05/18  Yes Reubin Milan, MD  loratadine (CLARITIN) 10 MG tablet Take 1 tablet (10 mg total) by mouth daily. Patient taking differently: Take 10 mg by mouth daily as needed for allergies.  10/13/17  Yes Enedina Finner, MD  meloxicam (MOBIC) 15 MG tablet TAKE 1  TABLET(15 MG) BY MOUTH DAILY 12/06/18  Yes Reubin Milan, MD  montelukast (SINGULAIR) 10 MG tablet TAKE 1 TABLET(10 MG) BY MOUTH AT BEDTIME Patient taking differently: Take 10 mg by mouth at bedtime.  12/05/18  Yes Reubin Milan, MD  mupirocin ointment (BACTROBAN) 2 % Apply to affected area 3 times daily 01/04/19 01/04/20 Yes Triplett, Cari B, FNP  omeprazole (PRILOSEC) 20 MG capsule TAKE 1 CAPSULE BY MOUTH DAILY Patient taking differently: Take 20 mg by mouth daily.  12/05/18  Yes Reubin Milan, MD  rOPINIRole (REQUIP) 2 MG tablet TAKE 1 TABLET BY MOUTH TWICE DAILY Patient taking differently: Take 2 mg by mouth 2 (two) times daily.  10/10/18  Yes Reubin Milan, MD  SPIRIVA HANDIHALER 18 MCG inhalation capsule INHALE CONTENTS OF 1 CAPSULE ONCE DAILY USING HANDIHALER Patient taking differently: Place 18 mcg into inhaler and inhale daily.  10/02/18  Yes Reubin Milan, MD  cefdinir (OMNICEF) 300 MG capsule Take 1 capsule (300 mg total) by mouth 2 (two) times  daily for 7 days. 03/31/19 04/07/19  LampteyMyrene Galas, MD  doxycycline (VIBRAMYCIN) 100 MG capsule Take 1 capsule (100 mg total) by mouth 2 (two) times daily for 7 days. 03/31/19 04/07/19  Chase Picket, MD  predniSONE (STERAPRED UNI-PAK 21 TAB) 10 MG (21) TBPK tablet Take by mouth daily. Take 6 tabs by mouth daily  for 2 days, then 5 tabs for 2 days, then 4 tabs for 2 days, then 3 tabs for 2 days, 2 tabs for 2 days, then 1 tab by mouth daily for 2 days 03/31/19   Chase Picket, MD    Family History Family History  Problem Relation Age of Onset  . Myoclonus Mother   . Breast cancer Mother   . Prostate cancer Father   . Heart disease Paternal Grandfather   . Myoclonus Maternal Uncle     Social History Social History   Tobacco Use  . Smoking status: Never Smoker  . Smokeless tobacco: Current User    Types: Chew  Substance Use Topics  . Alcohol use: No    Alcohol/week: 0.0 standard drinks  . Drug use: No     Allergies    Penicillins   Review of Systems Review of Systems  Constitutional: Positive for activity change and fatigue. Negative for chills and fever.  HENT: Negative.   Eyes: Negative.   Respiratory: Positive for cough, chest tightness and shortness of breath. Negative for wheezing.   Cardiovascular: Negative.   Gastrointestinal: Negative.   Endocrine: Negative.   Genitourinary: Negative.   Musculoskeletal: Negative.   Skin: Negative.   Neurological: Negative.   Psychiatric/Behavioral: Negative.   All other systems reviewed and are negative.    Physical Exam Triage Vital Signs ED Triage Vitals [03/31/19 1607]  Enc Vitals Group     BP (!) 141/93     Pulse Rate (!) 103     Resp 18     Temp 99 F (37.2 C)     Temp Source Oral     SpO2 96 %     Weight 158 lb (71.7 kg)     Height 6' (1.829 m)     Head Circumference      Peak Flow      Pain Score 9     Pain Loc      Pain Edu?      Excl. in [place]?    No data found.  Updated Vital Signs BP (!) 141/93 (BP Location: Left Arm)   Pulse (!) 103   Temp 99 F (37.2 C) (Oral)   Resp 18   Ht 6' (1.829 m)   Wt 71.7 kg   SpO2 96%   BMI 21.43 kg/m   Visual Acuity Right Eye Distance:   Left Eye Distance:   Bilateral Distance:    Right Eye Near:   Left Eye Near:    Bilateral Near:     Physical Exam Vitals signs and nursing note reviewed.  Constitutional:      General: He is in acute distress.     Appearance: He is ill-appearing.     Comments: Chronically ill looking  HENT:     Mouth/Throat:     Mouth: Mucous membranes are moist.  Eyes:     Pupils: Pupils are equal, round, and reactive to light.  Neck:     Musculoskeletal: Normal range of motion.  Cardiovascular:     Rate and Rhythm: Regular rhythm. Tachycardia present.  No extrasystoles are present.    Heart sounds:  No murmur. No gallop.   Pulmonary:     Effort: Pulmonary effort is normal. No tachypnea or respiratory distress.     Breath sounds: Examination of the  right-lower field reveals decreased breath sounds. Examination of the left-lower field reveals decreased breath sounds. Decreased breath sounds present. No wheezing, rhonchi or rales.  Chest:     Chest wall: No mass, tenderness or crepitus.  Abdominal:     Palpations: Abdomen is soft.  Musculoskeletal: Normal range of motion.  Skin:    General: Skin is warm.     Capillary Refill: Capillary refill takes less than 2 seconds.  Neurological:     General: No focal deficit present.     Mental Status: He is alert.      UC Treatments / Results  Labs (all labs ordered are listed, but only abnormal results are displayed) Labs Reviewed  CBC WITH DIFFERENTIAL/PLATELET - Abnormal; Notable for the following components:      Result Value   WBC 21.8 (*)    Neutro Abs 17.7 (*)    Monocytes Absolute 1.8 (*)    Abs Immature Granulocytes 0.11 (*)    All other components within normal limits  NOVEL CORONAVIRUS, NAA (HOSP ORDER, SEND-OUT TO REF LAB; TAT 18-24 HRS)    EKG   Radiology Dg Chest 2 View  Result Date: 03/31/2019 CLINICAL DATA:  Shortness of breath, history of puncture wound in the wrist, initial encounter EXAM: CHEST - 2 VIEW COMPARISON:  03/15/19 FINDINGS: Cardiac shadows within normal limits. Lungs are well aerated bilaterally. Patchy right middle lobe infiltrate is seen laterally. No sizable effusion is noted. No other focal abnormality is seen. IMPRESSION: Mild right middle lobe infiltrate. Electronically Signed   By: Alcide Clever M.D.   On: 03/31/2019 17:00    Procedures Procedures (including critical care time)  Medications Ordered in UC Medications  Tdap (BOOSTRIX) injection 0.5 mL (0.5 mLs Intramuscular Given 03/31/19 1630)    Initial Impression / Assessment and Plan / UC Course  I have reviewed the triage vital signs and the nursing notes.  Pertinent labs & imaging results that were available during my care of the patient were reviewed by me and considered in my medical  decision making (see chart for details).     1.  COPD with acute exacerbation: Continue bronchodilator treatments Patient has nebulizer at home but refuses to use it because he is under the impression that the nebulizer machine introduces [place] monoxide into our bodies.  After extensive discussion the patient continues to refuse nebulizer use. Tapering dose of steroids.  2.  Community-acquired pneumonia in the right lower lobe: Omnicef 300 mg twice daily for 7 days Doxycycline 100 mg twice daily for 7 days Patient will benefit from ED evaluation and possibly admission to the hospital given that his white cell count is 21,000 with left shift, he is barely able to complete sentences and he is tachycardic at rest.  Patient refuses ED visit said that he would rather go home with antibiotics and steroids. Final Clinical Impressions(s) / UC Diagnoses   Final diagnoses:  COPD exacerbation (HCC)  Community acquired pneumonia of right lower lobe of lung   Discharge Instructions   None    ED Prescriptions    Medication Sig Dispense Auth. Provider   cefdinir (OMNICEF) 300 MG capsule Take 1 capsule (300 mg total) by mouth 2 (two) times daily for 7 days. 14 capsule Breyton Vanscyoc, Britta Mccreedy, MD   doxycycline (VIBRAMYCIN) 100 MG capsule Take 1  capsule (100 mg total) by mouth 2 (two) times daily for 7 days. 14 capsule Michol Emory, Britta Mccreedy, MD   predniSONE (STERAPRED UNI-PAK 21 TAB) 10 MG (21) TBPK tablet Take by mouth daily. Take 6 tabs by mouth daily  for 2 days, then 5 tabs for 2 days, then 4 tabs for 2 days, then 3 tabs for 2 days, 2 tabs for 2 days, then 1 tab by mouth daily for 2 days 42 tablet Pegah Segel, Britta Mccreedy, MD     PDMP not reviewed this encounter.   Merrilee Jansky, MD 03/31/19 [phone]

## 2019-03-31 NOTE — ED Triage Notes (Signed)
Patient complains of left wrist puncture wound with a rusty nail a few days ago. Patient states that he has also noticed a increase in shortness of breath, states that he has COPD but "they wont do nothing for me about it."

## 2019-04-01 LAB — NOVEL CORONAVIRUS, NAA (HOSP ORDER, SEND-OUT TO REF LAB; TAT 18-24 HRS): SARS-CoV-2, NAA: NOT DETECTED

## 2019-04-14 ENCOUNTER — Encounter: Payer: Self-pay | Admitting: Internal Medicine

## 2019-04-14 ENCOUNTER — Other Ambulatory Visit: Payer: Self-pay

## 2019-04-14 ENCOUNTER — Ambulatory Visit (INDEPENDENT_AMBULATORY_CARE_PROVIDER_SITE_OTHER): Payer: Medicare HMO | Admitting: Internal Medicine

## 2019-04-14 VITALS — BP 114/78 | HR 83 | Ht 72.0 in | Wt 150.0 lb

## 2019-04-14 DIAGNOSIS — Z1211 Encounter for screening for malignant neoplasm of colon: Secondary | ICD-10-CM

## 2019-04-14 DIAGNOSIS — J439 Emphysema, unspecified: Secondary | ICD-10-CM

## 2019-04-14 DIAGNOSIS — Z23 Encounter for immunization: Secondary | ICD-10-CM | POA: Diagnosis not present

## 2019-04-14 DIAGNOSIS — Z0000 Encounter for general adult medical examination without abnormal findings: Secondary | ICD-10-CM

## 2019-04-14 DIAGNOSIS — N139 Obstructive and reflux uropathy, unspecified: Secondary | ICD-10-CM | POA: Diagnosis not present

## 2019-04-14 DIAGNOSIS — Z125 Encounter for screening for malignant neoplasm of prostate: Secondary | ICD-10-CM

## 2019-04-14 DIAGNOSIS — G250 Essential tremor: Secondary | ICD-10-CM

## 2019-04-14 DIAGNOSIS — I10 Essential (primary) hypertension: Secondary | ICD-10-CM

## 2019-04-14 LAB — POCT URINALYSIS DIPSTICK
Bilirubin, UA: NEGATIVE
Blood, UA: NEGATIVE
Glucose, UA: NEGATIVE
Ketones, UA: NEGATIVE
Leukocytes, UA: NEGATIVE
Nitrite, UA: NEGATIVE
Protein, UA: NEGATIVE
Spec Grav, UA: 1.01 (ref 1.010–1.025)
Urobilinogen, UA: 0.2 E.U./dL
pH, UA: 6 (ref 5.0–8.0)

## 2019-04-14 NOTE — Progress Notes (Signed)
Date:  04/14/2019   Name:  David Rhodes   DOB:  04-10-1956   MRN:  [id number]   Chief Complaint: Annual Exam (Declined flu shots.) David Rhodes is a 63 y.o. male who presents today for his Complete Annual Exam. He feels fairly well. He reports exercising none. He reports he is sleeping fairly well.   Colonoscopy - none Tdap 03/2019 Hep C 09/2018 HIV 12/2018 Pneumonia vaccines - none  Hypertension This is a chronic problem. The problem is controlled. Pertinent negatives include no chest pain, headaches, palpitations or shortness of breath. Past treatments include ACE inhibitors. The current treatment provides significant improvement.   Tremor - his deep brain stimulator became infected earlier this year, requiring removal and implantation in his abdomen.  He reports doing fairly well but the tremor on his right side is still bothersome.  He now has control of the remote to change his settings himself but he has not been able to find the right one to satisfy him.  COPD - he is under the care of Pulmonary.  On multiple medications but feels that he is doing fairly well at this time.   He has not had a pneumonia vaccine but agrees today.  He does not desire influenza vaccine.  Review of Systems  Constitutional: Negative for appetite change, chills, diaphoresis, fatigue and unexpected weight change.  HENT: Negative for hearing loss, tinnitus, trouble swallowing and voice change.   Eyes: Negative for visual disturbance.  Respiratory: Negative for choking, shortness of breath and wheezing.   Cardiovascular: Negative for chest pain, palpitations and leg swelling.  Gastrointestinal: Negative for abdominal pain, blood in stool, constipation and diarrhea.  Genitourinary: Positive for difficulty urinating and urgency. Negative for discharge, dysuria, frequency, hematuria and penile pain.  Musculoskeletal: Negative for arthralgias, back pain and myalgias.  Skin: Negative for color change and  rash.  Allergic/Immunologic: Negative for environmental allergies.  Neurological: Positive for tremors. Negative for dizziness, syncope and headaches.  Hematological: Negative for adenopathy.  Psychiatric/Behavioral: Negative for dysphoric mood and sleep disturbance.    Patient Active Problem List   Diagnosis Date Noted  . COPD exacerbation (HCC) 12/30/2018  . Acute on chronic respiratory failure with hypoxemia (HCC) 12/25/2018  . Inguinal hernia recurrent bilateral 11/16/2018  . Chronic left shoulder pain 10/05/2018  . Tremor 08/10/2018  . PICC (peripherally inserted central catheter) in place 04/16/2018  . 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 Essential tremor 10/18/2017  . Cervical radiculopathy 07/23/2017  . Bilateral temporomandibular joint pain 07/23/2017  . Neck pain 05/19/2017  . Medication monitoring encounter 09/11/2016  . Pulmonary nodules 12/31/2015  . COPD (chronic obstructive pulmonary disease) (HCC) 12/27/2015  . Chronic cough 12/27/2015  . Asthma 09/24/2015  . Allergic rhinitis 11/11/2014  . Nerve root pain 11/11/2014  . Dyskinesia 11/11/2014  . Acid reflux 11/11/2014  . Calcium blood increased 11/11/2014  . Affective disorder (HCC) 11/11/2014  . Essential hypertension 12/25/2011  . Chronic back pain 12/25/2011    Allergies  Allergen Reactions  . Penicillins Shortness Of Breath    PATIENT HAS HAD A PCN REACTION WITH IMMEDIATE RASH, FACIAL/TONGUE/THROAT SWELLING, SOB, OR LIGHTHEADEDNESS WITH HYPOTENSION:  #  #  YES  #  #  Has patient had a PCN reaction causing severe rash involving mucus membranes or skin necrosis: No Has patient had a PCN reaction that required hospitalization No Has patient had a PCN reaction occurring within the last 10 years: No If all of the  above answers are "NO", then may proceed with Cephalosporin use. **tolerated cefazolin and cephalexin 03/2017    Past Surgical History:  Procedure Laterality Date  . DEEP BRAIN  STIMULATOR PLACEMENT    . INGUINAL HERNIA REPAIR Bilateral 02/07/2014   Procedure:  BILATERAL INGUINAL HERNIA REPAIR;  Surgeon: Ralene Ok, MD;  Location: [place];  Service: General;  Laterality: Bilateral;  . INSERTION OF MESH Bilateral 02/07/2014   Procedure: INSERTION OF MESH;  Surgeon: Ralene Ok, MD;  Location: [place];  Service: General;  Laterality: Bilateral;  . NASAL SINUS SURGERY    . PULSE GENERATOR IMPLANT Left 09/11/2016   Procedure: Left chest-Change implantable pulse generator battery;  Surgeon: Erline Levine, MD;  Location: [place];  Service: Neurosurgery;  Laterality: Left;  Left chest-Change implantable pulse generator battery  . PULSE GENERATOR IMPLANT Left 04/06/2018   Procedure: Revision of left chest implantable pulse generator, extension, and pocket adapter;  Surgeon: Erline Levine, MD;  Location: [place];  Service: Neurosurgery;  Laterality: Left;  . SUBTHALAMIC STIMULATOR BATTERY REPLACEMENT  12/09/2011   Procedure: SUBTHALAMIC STIMULATOR BATTERY REPLACEMENT;  Surgeon: Erline Levine, MD;  Location: [place] [facility];  Service: Neurosurgery;  Laterality: N/A;   deep brain stimulator, implantable pulse generator change  . SUBTHALAMIC STIMULATOR BATTERY REPLACEMENT Left 04/21/2014   Procedure: Deep brain stimulator battery change;  Surgeon: Erline Levine, MD;  Location: [place] [facility];  Service: Neurosurgery;  Laterality: Left;  Deep brain stimulator battery change  . SUBTHALAMIC STIMULATOR BATTERY REPLACEMENT N/A 08/10/2018   Procedure: Replacement of deep brain stimulator pulse generator/left abdomen with long lead extensions;  Surgeon: Erline Levine, MD;  Location: [place];  Service: Neurosurgery;  Laterality: N/A;    Social History   Tobacco Use  . Smoking status: Never Smoker  . Smokeless tobacco: Current User    Types: Chew  Substance Use Topics  . Alcohol use: No    Alcohol/week: 0.0 standard drinks  . Drug use: No     Medication list has been reviewed and updated.   Current Meds  Medication Sig  . albuterol (PROVENTIL HFA) 108 (90 Base) MCG/ACT inhaler Inhale 2 puffs into the lungs every 4 (four) hours as needed for wheezing or shortness of breath.  Marland Kitchen albuterol (PROVENTIL HFA;VENTOLIN HFA) 108 (90 Base) MCG/ACT inhaler INHALE 1 TO 2 PUFFS BY MOUTH EVERY 6 HOURS AS NEEDED FOR WHEEZING OR SHORTNESS OF BREATH (Patient taking differently: Inhale 1-2 puffs into the lungs every 6 (six) hours as needed for wheezing or shortness of breath. )  . baclofen (LIORESAL) 10 MG tablet TAKE 1 TABLET(10 MG) BY MOUTH THREE TIMES DAILY  . BREO ELLIPTA 100-25 MCG/INH AEPB INHALE 1 PUFF INTO THE LUNGS DAILY  . gabapentin (NEURONTIN) 600 MG tablet TAKE 1 TABLET(600 MG) BY MOUTH FOUR TIMES DAILY (Patient taking differently: Take 600 mg by mouth 4 (four) times daily. )  . HYDROcodone-acetaminophen (NORCO/VICODIN) 5-325 MG tablet Take 1-2 tablets by mouth every 4 (four) hours as needed for severe pain ((score 7 to 10)).  . hydrocortisone 2.5 % lotion Apply topically 2 (two) times daily.  Marland Kitchen lisinopril (ZESTRIL) 40 MG tablet Take 1 tablet (40 mg total) by mouth daily.  Marland Kitchen loratadine (CLARITIN) 10 MG tablet Take 1 tablet (10 mg total) by mouth daily. (Patient taking differently: Take 10 mg by mouth daily as needed for allergies. )  . meloxicam (MOBIC) 15 MG tablet TAKE 1 TABLET(15 MG) BY MOUTH DAILY  . montelukast (SINGULAIR) 10 MG tablet TAKE 1 TABLET(10 MG)  BY MOUTH AT BEDTIME (Patient taking differently: Take 10 mg by mouth at bedtime. )  . mupirocin ointment (BACTROBAN) 2 % Apply to affected area 3 times daily  . omeprazole (PRILOSEC) 20 MG capsule TAKE 1 CAPSULE BY MOUTH DAILY (Patient taking differently: Take 20 mg by mouth daily. )  . rOPINIRole (REQUIP) 2 MG tablet TAKE 1 TABLET BY MOUTH TWICE DAILY (Patient taking differently: Take 2 mg by mouth 2 (two) times daily. )  . SPIRIVA HANDIHALER 18 MCG inhalation capsule INHALE CONTENTS OF 1 CAPSULE ONCE DAILY USING HANDIHALER (Patient  taking differently: Place 18 mcg into inhaler and inhale daily. )    PHQ 2/9 Scores 04/14/2019 11/16/2018 10/05/2018 11/02/2017  PHQ - 2 Score 0 1 0 0    BP Readings from Last 3 Encounters:  04/14/19 114/78  03/31/19 (!) 141/93  03/22/19 (!) 149/106    Physical Exam Vitals signs and nursing note reviewed.  Constitutional:      Appearance: Normal appearance. He is well-developed.  HENT:     Head: Normocephalic.     Right Ear: Tympanic membrane, ear canal and external ear normal.     Left Ear: Tympanic membrane, ear canal and external ear normal.     Nose: Nose normal.     Mouth/Throat:     Pharynx: Uvula midline.  Eyes:     Conjunctiva/sclera: Conjunctivae normal.     Pupils: Pupils are equal, round, and reactive to light.  Neck:     Musculoskeletal: Normal range of motion and neck supple.     Thyroid: No thyromegaly.     Vascular: No carotid bruit.  Cardiovascular:     Rate and Rhythm: Normal rate and regular rhythm.     Heart sounds: Normal heart sounds.  Pulmonary:     Effort: Pulmonary effort is normal.     Breath sounds: Decreased air movement present. No wheezing or rhonchi.  Chest:     Chest wall: No mass, deformity, swelling or tenderness.     Breasts:        Right: No mass.        Left: No mass.  Abdominal:     General: Abdomen is flat. Bowel sounds are normal.     Palpations: Abdomen is soft. There is no hepatomegaly or splenomegaly.     Tenderness: There is no abdominal tenderness.    Genitourinary:    Comments: Pt declines exam Musculoskeletal: Normal range of motion.  Lymphadenopathy:     Cervical: No cervical adenopathy.  Skin:    General: Skin is warm and dry.  Neurological:     Mental Status: He is alert and oriented to person, place, and time.     Deep Tendon Reflexes: Reflexes are normal and symmetric.     Comments: No obvious tremor noted today  Psychiatric:        Attention and Perception: Attention normal.        Mood and Affect: Mood  normal.        Speech: Speech normal.        Behavior: Behavior normal.        Thought Content: Thought content normal.        Judgment: Judgment normal.     Wt Readings from Last 3 Encounters:  04/14/19 150 lb (68 kg)  03/31/19 158 lb (71.7 kg)  03/21/19 158 lb (71.7 kg)    BP 114/78   Pulse 83   Ht 6' (1.829 m)   Wt 150 lb (68 kg)  SpO2 94%   BMI 20.34 kg/m   Assessment and Plan: 1. Annual physical exam Clinically stable at this time Pt declines flu vaccine - Lipid panel - POCT urinalysis dipstick  2. Prostate cancer screening Pt voices urinary issues with urgency and occasional leakage but refuses DRE Will obtain PSA, encourage pt to see Urology if worsening - PSA  3. Colon cancer screening - Ambulatory referral to Gastroenterology  4. Essential hypertension Clinically stable exam with well controlled BP.   Tolerating medications, lisinopril 40 mg, without side effects at this time. Pt to continue current regimen and low sodium diet; benefits of regular exercise as able discussed. - CBC with Differential/Platelet - Comprehensive metabolic panel  5. Pulmonary emphysema, unspecified emphysema type (HCC) Last hospitalized in July with acute respiratory failure On multiple meds including singulair, spriva, breo and albuterol Appears stable today without DOE or wheezing Continue follow up with Pulmonary and current therapy - pulmonary requesting overnight pulse oximetry and pulmonary rehab  6. Essential tremor Deep brain stimulator in place On Gabapentin and Requip  7. Need for vaccination for pneumococcus - Pneumococcal polysaccharide vaccine 23-valent greater than or equal to 2yo subcutaneous/IM  8. Urinary (tract) obstruction By patient report, concern for BPH or other cause not evident on exam today   Partially dictated using Dragon software. Any errors are unintentional.  Bari Edward, MD [facility]   04/14/2019

## 2019-04-15 ENCOUNTER — Other Ambulatory Visit: Payer: Self-pay | Admitting: Internal Medicine

## 2019-04-15 DIAGNOSIS — D72829 Elevated white blood cell count, unspecified: Secondary | ICD-10-CM | POA: Insufficient documentation

## 2019-04-15 LAB — COMPREHENSIVE METABOLIC PANEL
ALT: 17 IU/L (ref 0–44)
AST: 18 IU/L (ref 0–40)
Albumin/Globulin Ratio: 1.5 (ref 1.2–2.2)
Albumin: 3.8 g/dL (ref 3.8–4.8)
Alkaline Phosphatase: 102 IU/L (ref 39–117)
BUN/Creatinine Ratio: 18 (ref 10–24)
BUN: 17 mg/dL (ref 8–27)
Bilirubin Total: 0.3 mg/dL (ref 0.0–1.2)
CO2: 25 mmol/L (ref 20–29)
Calcium: 11.1 mg/dL — ABNORMAL HIGH (ref 8.6–10.2)
Chloride: 105 mmol/L (ref 96–106)
Creatinine, Ser: 0.97 mg/dL (ref 0.76–1.27)
GFR calc Af Amer: 96 mL/min/{1.73_m2} (ref >59)
GFR calc non Af Amer: 83 mL/min/{1.73_m2} (ref >59)
Globulin, Total: 2.5 g/dL (ref 1.5–4.5)
Glucose: 80 mg/dL (ref 65–99)
Potassium: 5.5 mmol/L — ABNORMAL HIGH (ref 3.5–5.2)
Sodium: 143 mmol/L (ref 134–144)
Total Protein: 6.3 g/dL (ref 6.0–8.5)

## 2019-04-15 LAB — CBC WITH DIFFERENTIAL/PLATELET
Basophils Absolute: 0.1 10*3/uL (ref 0.0–0.2)
Basos: 1 %
EOS (ABSOLUTE): 0.6 10*3/uL — ABNORMAL HIGH (ref 0.0–0.4)
Eos: 3 %
Hematocrit: 48.6 % (ref 37.5–51.0)
Hemoglobin: 15.4 g/dL (ref 13.0–17.7)
Immature Grans (Abs): 0.1 10*3/uL (ref 0.0–0.1)
Immature Granulocytes: 1 %
Lymphocytes Absolute: 2.8 10*3/uL (ref 0.7–3.1)
Lymphs: 16 %
MCH: 28.7 pg (ref 26.6–33.0)
MCHC: 31.7 g/dL (ref 31.5–35.7)
MCV: 91 fL (ref 79–97)
Monocytes Absolute: 1.8 10*3/uL — ABNORMAL HIGH (ref 0.1–0.9)
Monocytes: 10 %
Neutrophils Absolute: 12 10*3/uL — ABNORMAL HIGH (ref 1.4–7.0)
Neutrophils: 69 %
Platelets: 338 10*3/uL (ref 150–450)
RBC: 5.37 x10E6/uL (ref 4.14–5.80)
RDW: 12.7 % (ref 11.6–15.4)
WBC: 17.4 10*3/uL — ABNORMAL HIGH (ref 3.4–10.8)

## 2019-04-15 LAB — LIPID PANEL
Chol/HDL Ratio: 2.1 ratio (ref 0.0–5.0)
Cholesterol, Total: 130 mg/dL (ref 100–199)
HDL: 61 mg/dL (ref >39)
LDL Chol Calc (NIH): 58 mg/dL (ref 0–99)
Triglycerides: 45 mg/dL (ref 0–149)
VLDL Cholesterol Cal: 11 mg/dL (ref 5–40)

## 2019-04-15 LAB — PSA: Prostate Specific Ag, Serum: 0.5 ng/mL (ref 0.0–4.0)

## 2019-04-15 NOTE — Progress Notes (Signed)
Patient informed of labs. He said he does not take prednisone. He agrees to see a hematologist and agrees to referral.  Please advise.

## 2019-04-22 ENCOUNTER — Encounter: Payer: Self-pay | Admitting: *Deleted

## 2019-04-28 NOTE — Progress Notes (Signed)
[facility]  [address], [address] [place], [place] [address] Phone: [phone]  Fax: [phone]   Clinic Day:  04/29/2019  Referring physician: Reubin Rhodes, David H, MD  Chief Complaint: David BienenstockRonald E Rhodes is a 63 y.o. male with leukocytosis who is referred in consultation by David. Bari EdwardLaura Rhodes for assessment and management.   HPI:  The patient notes a history of a chronic cough.  He has never smoked.  He states that he previously had a pulmonologist but is unclear of their name.  He feels like he needs to have oxygen to use as needed.  He states that he has a history of recurrent pneumonia, but is unclear of how often he has had infections.    [facility]2019 with a puncture wound of the left wrist as well as shortness of breath progressive over a few days.  He had a cough productive of yellow sputum.  CXR revealed a mild right middle lobe infiltrate.  He received a tetanus injection.  He was felt to have COPD with acute exacerbation.  He was to continue bronchodilator treatments.  He was noted to have a nebulizer at home but refused to use it.  He was prescribed Omnicef 300 mg twice daily x7 days and doxycycline 100 mg twice daily x7 days.  He was put on a prednisone taper x 12 days.  WBC was 21,800.  He refused ER evaluation.    He was seen on 04/14/2019 by David Rhodes.  Labs revealed hematocrit 48.6, hemoglobin 15.4, MCV 91, platelets 338,000, WBC 17,400 with an ANC 12,000. PSA was 0.5  CBC has been followed: 06/02/2018: Hematocrit 50.0, hemoglobin 17.3, platelets 252,000, white count 8,800. 10/18/2017: Hematocrit 45.9, hemoglobin 15.5, platelets 233,000, white count 18,600. 10/19/2017: Hematocrit 38.4, hemoglobin 12.8, platelets 245,000, white count 11,200. 12/30/2018: Hematocrit 46.7, hemoglobin 15.3, platelets 237,000, white count 16,000  12/31/2018: Hematocrit 41.2, hemoglobin 13.5, platelets 211,000, white count  30,000 with an ANC of 27,400.  Differential included 91% segs 5% lymphs 3% monocytes and 1% bands.   03/15/2019: Hematocrit 45.4, hemoglobin 15.4, platelets 240,000, white count 17,200  03/31/2019: Hematocrit 44.2, hemoglobin 15.0, platelets 228,000, white count 21,800 with an ANC of 17,700.   04/14/2019: Hematocrit 48.6, hemoglobin 15.4, platelets 338,000, white count 17,400 with an ANC of 12,000.   Symptomatically, he is chronically fatigued.  He describes muscle and joint pain.  He describes exposure to chemicals and asbestos.  He denies any history of hepatitis.  He describes a past infection of his brain stimulator (placed due to his chronic tremors).   He has family history of cancer but unsure of what types. His mother and father are both living but have both had cancer.   Past Medical History:  Diagnosis Date  . Allergy    Hay fever  . Anxiety   . Asthma   . Chronic pain due to trauma   . COPD (chronic obstructive pulmonary disease) (HCC)   . Depression   . Essential tremor   . GERD (gastroesophageal reflux disease)   . Headache(784.0)    neck pain  . Hypertension   . 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 Inguinal hernia    bilateral  . Neuromuscular disorder (HCC)    hx of tremors  . Pneumonia    many years ago  . Shortness of breath    when allergies flare up  . Urine incontinence   . Wears glasses  Past Surgical History:  Procedure Laterality Date  . DEEP BRAIN STIMULATOR PLACEMENT    . INGUINAL HERNIA REPAIR Bilateral 02/07/2014   Procedure:  BILATERAL INGUINAL HERNIA REPAIR;  Surgeon: Ralene Ok, MD;  Location: [place];  Service: General;  Laterality: Bilateral;  . INSERTION OF MESH Bilateral 02/07/2014   Procedure: INSERTION OF MESH;  Surgeon: Ralene Ok, MD;  Location: [place];  Service: General;  Laterality: Bilateral;  . NASAL SINUS SURGERY    . PULSE GENERATOR IMPLANT Left 09/11/2016   Procedure: Left chest-Change implantable pulse  generator battery;  Surgeon: Erline Levine, MD;  Location: [place];  Service: Neurosurgery;  Laterality: Left;  Left chest-Change implantable pulse generator battery  . PULSE GENERATOR IMPLANT Left 04/06/2018   Procedure: Revision of left chest implantable pulse generator, extension, and pocket adapter;  Surgeon: Erline Levine, MD;  Location: [place];  Service: Neurosurgery;  Laterality: Left;  . SUBTHALAMIC STIMULATOR BATTERY REPLACEMENT  12/09/2011   Procedure: SUBTHALAMIC STIMULATOR BATTERY REPLACEMENT;  Surgeon: Erline Levine, MD;  Location: [place] [facility];  Service: Neurosurgery;  Laterality: N/A;   deep brain stimulator, implantable pulse generator change  . SUBTHALAMIC STIMULATOR BATTERY REPLACEMENT Left 04/21/2014   Procedure: Deep brain stimulator battery change;  Surgeon: Erline Levine, MD;  Location: [place] [facility];  Service: Neurosurgery;  Laterality: Left;  Deep brain stimulator battery change  . SUBTHALAMIC STIMULATOR BATTERY REPLACEMENT N/A 08/10/2018   Procedure: Replacement of deep brain stimulator pulse generator/left abdomen with long lead extensions;  Surgeon: Erline Levine, MD;  Location: [place];  Service: Neurosurgery;  Laterality: N/A;    Family History  Problem Relation Age of Onset  . Myoclonus Mother   . Breast cancer Mother   . Prostate cancer Father   . Heart disease Paternal Grandfather   . Myoclonus Maternal Uncle     Social History:  reports that he has never smoked. His smokeless tobacco use includes chew. He reports that he does not drink alcohol or use drugs. He worked in [employer].  He stopped working due to a work accident in 1999.  He lives in [place].  The patient is alone today.  Allergies:  Allergies  Allergen Reactions  . Penicillins Shortness Of Breath    PATIENT HAS HAD A PCN REACTION WITH IMMEDIATE RASH, FACIAL/TONGUE/THROAT SWELLING, SOB, OR LIGHTHEADEDNESS WITH HYPOTENSION:  #  #  YES  #  #  Has patient had a PCN reaction causing severe rash  involving mucus membranes or skin necrosis: No Has patient had a PCN reaction that required hospitalization No Has patient had a PCN reaction occurring within the last 10 years: No If all of the above answers are "NO", then may proceed with Cephalosporin use. **tolerated cefazolin and cephalexin 03/2017    Current Medications: Current Outpatient Medications  Medication Sig Dispense Refill  . albuterol (PROVENTIL HFA;VENTOLIN HFA) 108 (90 Base) MCG/ACT inhaler INHALE 1 TO 2 PUFFS BY MOUTH EVERY 6 HOURS AS NEEDED FOR WHEEZING OR SHORTNESS OF BREATH (Patient taking differently: Inhale 1-2 puffs into the lungs every 6 (six) hours as needed for wheezing or shortness of breath. ) 8.5 g 5  . baclofen (LIORESAL) 10 MG tablet TAKE 1 TABLET(10 MG) BY MOUTH THREE TIMES DAILY 90 tablet 1  . BREO ELLIPTA 100-25 MCG/INH AEPB INHALE 1 PUFF INTO THE LUNGS DAILY 60 each 12  . gabapentin (NEURONTIN) 600 MG tablet TAKE 1 TABLET(600 MG) BY MOUTH FOUR TIMES DAILY (Patient taking differently: Take 600 mg by mouth 4 (four)  times daily. ) 120 tablet 5  . HYDROcodone-acetaminophen (NORCO/VICODIN) 5-325 MG tablet Take 1-2 tablets by mouth every 4 (four) hours as needed for severe pain ((score 7 to 10)). 30 tablet 0  . hydrocortisone 2.5 % lotion Apply topically 2 (two) times daily. 59 mL 0  . lisinopril (ZESTRIL) 40 MG tablet Take 1 tablet (40 mg total) by mouth daily. 90 tablet 1  . loratadine (CLARITIN) 10 MG tablet Take 1 tablet (10 mg total) by mouth daily. 30 tablet 0  . meloxicam (MOBIC) 15 MG tablet TAKE 1 TABLET(15 MG) BY MOUTH DAILY 90 tablet 1  . montelukast (SINGULAIR) 10 MG tablet TAKE 1 TABLET(10 MG) BY MOUTH AT BEDTIME (Patient taking differently: Take 10 mg by mouth at bedtime. ) 90 tablet 1  . mupirocin ointment (BACTROBAN) 2 % Apply to affected area 3 times daily 22 g 0  . omeprazole (PRILOSEC) 20 MG capsule TAKE 1 CAPSULE BY MOUTH DAILY (Patient taking differently: Take 20 mg by mouth daily. ) 90  capsule 1  . rOPINIRole (REQUIP) 2 MG tablet TAKE 1 TABLET BY MOUTH TWICE DAILY (Patient taking differently: Take 2 mg by mouth 2 (two) times daily. ) 60 tablet 5  . SPIRIVA HANDIHALER 18 MCG inhalation capsule INHALE CONTENTS OF 1 CAPSULE ONCE DAILY USING HANDIHALER (Patient taking differently: Place 18 mcg into inhaler and inhale daily. ) 30 capsule 12   No current facility-administered medications for this visit.     Review of Systems  Constitutional: Positive for malaise/fatigue and weight loss (unknown how much). Negative for chills, diaphoresis and fever.       Feels tired all of the time.  HENT: Negative for ear discharge, ear pain, nosebleeds, sinus pain, sore throat and tinnitus.        Rhinorrhea.  Eyes: Positive for blurred vision (should glasses but doesn't). Negative for double vision and pain.  Respiratory: Positive for cough (chronic wet and mucousy) and sputum production (yellowish, most of time). Negative for hemoptysis and shortness of breath.   Cardiovascular: Positive for chest pain (from coughing). Negative for palpitations, orthopnea and leg swelling.  Gastrointestinal: Negative.  Negative for abdominal pain, blood in stool, constipation, diarrhea, heartburn, melena, nausea and vomiting.  Genitourinary: Positive for urgency (chronic for a 2-3 months). Negative for dysuria, frequency and hematuria.  Musculoskeletal: Positive for joint pain (muscle aches, prominent to 2020). Negative for falls and neck pain.  Skin: Negative.  Negative for itching and rash.  Neurological: Positive for dizziness (on awakening in the morning, equilibrium is off) and tremors (s/p implanted device). Negative for tingling, focal weakness, seizures, weakness and headaches.  Endo/Heme/Allergies: Positive for environmental allergies.  Psychiatric/Behavioral: Negative.  Negative for substance abuse. The patient is not nervous/anxious.    Performance status (ECOG): 1 - Symptomatic but completely  ambulatory  Vitals Blood pressure (!) 143/100, pulse 91, temperature 97.7 F (36.5 C), temperature source Oral, resp. rate 16, height 6' (1.829 m), weight 149 lb 5.8 oz (67.8 kg), SpO2 97 %.   Physical Exam  Constitutional: He is oriented to person, place, and time.  Chronically fatigued appearing gentleman sitting comfortably in the exam room in no acute distress.  HENT:  Head: Normocephalic and atraumatic.  Mouth/Throat: Oropharynx is clear and moist.  Cap.  Mask.  Dark hair and graying beard.  Edentulous.  Eyes: Pupils are equal, round, and reactive to light. Conjunctivae and EOM are normal. No scleral icterus.  Neck: Normal range of motion. No JVD present.  Cardiovascular: Normal rate,  regular rhythm and normal heart sounds.  Pulmonary/Chest: Breath sounds normal. He has no wheezes. He has no rales. He exhibits no tenderness.  Poor respiratory excursion.  Scarring left upper chest s/p old brain stimulator placement (no evidence of infection).  Abdominal: Soft. Bowel sounds are normal. He exhibits no distension and no mass. There is no abdominal tenderness. There is no rebound and no guarding.  LUQ deep brain stimulator implant.   Musculoskeletal:        General: No edema.  Lymphadenopathy:       Head (right side): No preauricular, no posterior auricular and no occipital adenopathy present.       Head (left side): No preauricular, no posterior auricular and no occipital adenopathy present.    He has no cervical adenopathy.    He has no axillary adenopathy.       Right: No inguinal adenopathy present.       Left: No inguinal and no supraclavicular adenopathy present.  Neurological: He is alert and oriented to person, place, and time.  Skin: Skin is warm and dry. No rash noted. No erythema. No pallor.  Psychiatric: He has a normal mood and affect. His behavior is normal. Judgment and thought content normal.  Nursing note and vitals reviewed.   No visits with results within 3  Day(s) from this visit.  Latest known visit with results is:  Office Visit on 04/14/2019  Component Date Value Ref Range Status  . WBC 04/14/2019 17.4* 3.4 - 10.8 x10E3/uL Final  . RBC 04/14/2019 5.37  4.14 - 5.80 x10E6/uL Final  . Hemoglobin 04/14/2019 15.4  13.0 - 17.7 g/dL Final  . Hematocrit 78/29/5621 48.6  37.5 - 51.0 % Final  . MCV 04/14/2019 91  79 - 97 fL Final  . MCH 04/14/2019 28.7  26.6 - 33.0 pg Final  . MCHC 04/14/2019 31.7  31.5 - 35.7 g/dL Final  . RDW 30/86/5784 12.7  11.6 - 15.4 % Final  . Platelets 04/14/2019 338  150 - 450 x10E3/uL Final  . Neutrophils 04/14/2019 69  Not Estab. % Final  . Lymphs 04/14/2019 16  Not Estab. % Final  . Monocytes 04/14/2019 10  Not Estab. % Final  . Eos 04/14/2019 3  Not Estab. % Final  . Basos 04/14/2019 1  Not Estab. % Final  . Neutrophils Absolute 04/14/2019 12.0* 1.4 - 7.0 x10E3/uL Final  . Lymphocytes Absolute 04/14/2019 2.8  0.7 - 3.1 x10E3/uL Final  . Monocytes Absolute 04/14/2019 1.8* 0.1 - 0.9 x10E3/uL Final  . EOS (ABSOLUTE) 04/14/2019 0.6* 0.0 - 0.4 x10E3/uL Final  . Basophils Absolute 04/14/2019 0.1  0.0 - 0.2 x10E3/uL Final  . Immature Granulocytes 04/14/2019 1  Not Estab. % Final  . Immature Grans (Abs) 04/14/2019 0.1  0.0 - 0.1 x10E3/uL Final  . Glucose 04/14/2019 80  65 - 99 mg/dL Final  . BUN 69/62/9528 17  8 - 27 mg/dL Final  . Creatinine, Ser 04/14/2019 0.97  0.76 - 1.27 mg/dL Final  . GFR calc non Af Amer 04/14/2019 83  >59 mL/min/1.73 Final  . GFR calc Af Amer 04/14/2019 96  >59 mL/min/1.73 Final  . BUN/Creatinine Ratio 04/14/2019 18  10 - 24 Final  . Sodium 04/14/2019 143  134 - 144 mmol/L Final  . Potassium 04/14/2019 5.5* 3.5 - 5.2 mmol/L Final  . Chloride 04/14/2019 105  96 - 106 mmol/L Final  . CO2 04/14/2019 25  20 - 29 mmol/L Final  . Calcium 04/14/2019 11.1* 8.6 - 10.2 mg/dL  Final  . Total Protein 04/14/2019 6.3  6.0 - 8.5 g/dL Final  . Albumin 16/03/9603 3.8  3.8 - 4.8 g/dL Final  . Globulin, Total  04/14/2019 2.5  1.5 - 4.5 g/dL Final  . Albumin/Globulin Ratio 04/14/2019 1.5  1.2 - 2.2 Final  . Bilirubin Total 04/14/2019 0.3  0.0 - 1.2 mg/dL Final  . Alkaline Phosphatase 04/14/2019 102  39 - 117 IU/L Final  . AST 04/14/2019 18  0 - 40 IU/L Final  . ALT 04/14/2019 17  0 - 44 IU/L Final  . Cholesterol, Total 04/14/2019 130  100 - 199 mg/dL Final  . Triglycerides 04/14/2019 45  0 - 149 mg/dL Final  . HDL 54/02/8118 61  >39 mg/dL Final  . VLDL Cholesterol Cal 04/14/2019 11  5 - 40 mg/dL Final  . LDL Chol Calc (NIH) 04/14/2019 58  0 - 99 mg/dL Final  . Chol/HDL Ratio 04/14/2019 2.1  0.0 - 5.0 ratio Final   Comment:                                   T. Chol/HDL Ratio                                             Men  Women                               1/2 Avg.Risk  3.4    3.3                                   Avg.Risk  5.0    4.4                                2X Avg.Risk  9.6    7.1                                3X Avg.Risk 23.4   11.0   . Prostate Specific Ag, Serum 04/14/2019 0.5  0.0 - 4.0 ng/mL Final   Comment: Roche ECLIA methodology. According to the American Urological Association, Serum PSA should decrease and remain at undetectable levels after radical prostatectomy. The AUA defines biochemical recurrence as an initial PSA value 0.2 ng/mL or greater followed by a subsequent confirmatory PSA value 0.2 ng/mL or greater. Values obtained with different assay methods or kits cannot be used interchangeably. Results cannot be interpreted as absolute evidence of the presence or absence of malignant disease.   . Color, UA 04/14/2019 yell   Final  . Clarity, UA 04/14/2019 cloudy   Final  . Glucose, UA 04/14/2019 Negative  Negative Final  . Bilirubin, UA 04/14/2019 neg   Final  . Ketones, UA 04/14/2019 neg   Final  . Spec Grav, UA 04/14/2019 1.010  1.010 - 1.025 Final  . Blood, UA 04/14/2019 neg   Final  . pH, UA 04/14/2019 6.0  5.0 - 8.0 Final  . Protein, UA 04/14/2019 Negative   Negative Final  . Urobilinogen, UA 04/14/2019 0.2  0.2 or 1.0 E.U./dL Final  .  Nitrite, UA 04/14/2019 neg   Final  . Leukocytes, UA 04/14/2019 Negative  Negative Final  . Appearance 04/14/2019 clear   Final  . Odor 04/14/2019 none   Final    Assessment:  David Rhodes is a 63 y.o. male with probable reactive leukocytosis likely secondary to recurrent pneumonia/upper respiratory tract infections.  He also had an infection of his brain stimulator (placed due to his chronic tremors).  WBC has ranged between 11,000 - 30,000 since 09/2017.  Differential is predominantly neutrophils.  He has been on steroids periodically.  He was seen in the [facility] on 03/31/2019 with a puncture wound of the left wrist and a mild right middle lobe infiltrate.  He received a tetanus booster.  He was felt to have COPD with acute exacerbation.  He received Omnicef 300 mg twice daily x7 days and doxycycline 100 mg twice daily x7 days.  He was put on a prednisone taper x 12 days.  WBC was 21,800.   Symptomatically, he is chronically fatigued.  He has muscle and joint pain.  Exam reveals poor respiratory excursion.  He has no adenopathy or hepatosplenomegaly.   Plan: 1.   Labs today:  CBC with diff, CRP, sed rate, BCR-ABL. 2.   Peripheral smear for pathologic review. 3.   Leukocytosis  Etiology is felt reactive with infection and oral steroids.  Discuss need to follow-up with pulmonary medicine.   Consider follow-up chest CT.   Discuss work-up. 4.   Pulmonary consult (patient followed at [facility]).  5.   RTC in 7-10 days for MD assessment, review of work-up, and discussion regarding direction of therapy.  I discussed the assessment and treatment plan with the patient.  The patient was provided an opportunity to ask questions and all were answered.  The patient agreed with the plan and demonstrated an understanding of the instructions.  The patient was advised to call back if the symptoms worsen or if the  condition fails to improve as anticipated.   Melissa C. Merlene Pulling, MD, PhD    04/29/2019, 10:28 AM  I, Arnette Norris, am acting as scribe for [employer]. Merlene Pulling, MD, PhD.  I, Melissa C. Merlene Pulling, MD, have reviewed the above documentation for accuracy and completeness, and I agree with the above.

## 2019-04-29 ENCOUNTER — Other Ambulatory Visit: Payer: Self-pay

## 2019-04-29 ENCOUNTER — Encounter: Payer: Self-pay | Admitting: Hematology and Oncology

## 2019-04-29 ENCOUNTER — Telehealth: Payer: Self-pay

## 2019-04-29 ENCOUNTER — Inpatient Hospital Stay: Payer: Medicare HMO | Attending: Hematology and Oncology | Admitting: Hematology and Oncology

## 2019-04-29 ENCOUNTER — Inpatient Hospital Stay: Payer: Medicare HMO

## 2019-04-29 VITALS — BP 143/100 | HR 91 | Temp 97.7°F | Resp 16 | Ht 72.0 in | Wt 149.4 lb

## 2019-04-29 DIAGNOSIS — Z7951 Long term (current) use of inhaled steroids: Secondary | ICD-10-CM

## 2019-04-29 DIAGNOSIS — Z803 Family history of malignant neoplasm of breast: Secondary | ICD-10-CM

## 2019-04-29 DIAGNOSIS — J449 Chronic obstructive pulmonary disease, unspecified: Secondary | ICD-10-CM

## 2019-04-29 DIAGNOSIS — Z8701 Personal history of pneumonia (recurrent): Secondary | ICD-10-CM

## 2019-04-29 DIAGNOSIS — Z720 Tobacco use: Secondary | ICD-10-CM

## 2019-04-29 DIAGNOSIS — Z791 Long term (current) use of non-steroidal anti-inflammatories (NSAID): Secondary | ICD-10-CM | POA: Diagnosis not present

## 2019-04-29 DIAGNOSIS — D72829 Elevated white blood cell count, unspecified: Secondary | ICD-10-CM

## 2019-04-29 DIAGNOSIS — R05 Cough: Secondary | ICD-10-CM | POA: Diagnosis not present

## 2019-04-29 DIAGNOSIS — Z8042 Family history of malignant neoplasm of prostate: Secondary | ICD-10-CM | POA: Diagnosis not present

## 2019-04-29 DIAGNOSIS — I10 Essential (primary) hypertension: Secondary | ICD-10-CM

## 2019-04-29 DIAGNOSIS — Z79899 Other long term (current) drug therapy: Secondary | ICD-10-CM | POA: Diagnosis not present

## 2019-04-29 LAB — CBC WITH DIFFERENTIAL/PLATELET
Abs Immature Granulocytes: 0.04 10*3/uL (ref 0.00–0.07)
Basophils Absolute: 0.1 10*3/uL (ref 0.0–0.1)
Basophils Relative: 1 %
Eosinophils Absolute: 0.7 10*3/uL — ABNORMAL HIGH (ref 0.0–0.5)
Eosinophils Relative: 7 %
HCT: 45.6 % (ref 39.0–52.0)
Hemoglobin: 15.2 g/dL (ref 13.0–17.0)
Immature Granulocytes: 0 %
Lymphocytes Relative: 22 %
Lymphs Abs: 1.9 10*3/uL (ref 0.7–4.0)
MCH: 29.1 pg (ref 26.0–34.0)
MCHC: 33.3 g/dL (ref 30.0–36.0)
MCV: 87.2 fL (ref 80.0–100.0)
Monocytes Absolute: 0.8 10*3/uL (ref 0.1–1.0)
Monocytes Relative: 9 %
Neutro Abs: 5.4 10*3/uL (ref 1.7–7.7)
Neutrophils Relative %: 61 %
Platelets: 315 10*3/uL (ref 150–400)
RBC: 5.23 MIL/uL (ref 4.22–5.81)
RDW: 12.8 % (ref 11.5–15.5)
WBC: 8.9 10*3/uL (ref 4.0–10.5)
nRBC: 0 % (ref 0.0–0.2)

## 2019-04-29 LAB — SEDIMENTATION RATE: Sed Rate: 3 mm/hr (ref 0–20)

## 2019-04-29 LAB — PATHOLOGIST SMEAR REVIEW

## 2019-04-29 LAB — C-REACTIVE PROTEIN: CRP: <0.8 mg/dL (ref <1.0)

## 2019-04-29 NOTE — Progress Notes (Signed)
Pt here as new patient referral from Dr. Army Melia with leukocytosis. Reports he is fatigued a lot and has muscle/joint aches.

## 2019-04-29 NOTE — Telephone Encounter (Signed)
Spoke with Tandra with Dr. Mathis Fare office (pulmonary) to schedule appt. Appt. Is scheduled for November 5 at 3:45PM. Deatiled VM left for patient regarding appt. with patient's permission.

## 2019-05-05 ENCOUNTER — Ambulatory Visit
Admission: RE | Admit: 2019-05-05 | Discharge: 2019-05-05 | Disposition: A | Discharge status: Home / Self Care | Payer: Medicare HMO | Source: Ambulatory Visit | Attending: Pulmonary Disease | Admitting: Pulmonary Disease

## 2019-05-05 ENCOUNTER — Ambulatory Visit: Payer: Medicare HMO | Admitting: Pulmonary Disease

## 2019-05-05 ENCOUNTER — Encounter: Payer: Self-pay | Admitting: Pulmonary Disease

## 2019-05-05 ENCOUNTER — Other Ambulatory Visit: Payer: Self-pay

## 2019-05-05 VITALS — BP 128/88 | HR 94 | Temp 97.9°F | Ht 72.0 in | Wt 156.0 lb

## 2019-05-05 DIAGNOSIS — R918 Other nonspecific abnormal finding of lung field: Secondary | ICD-10-CM

## 2019-05-05 DIAGNOSIS — J449 Chronic obstructive pulmonary disease, unspecified: Secondary | ICD-10-CM | POA: Diagnosis not present

## 2019-05-05 DIAGNOSIS — J189 Pneumonia, unspecified organism: Secondary | ICD-10-CM | POA: Diagnosis not present

## 2019-05-05 DIAGNOSIS — R0602 Shortness of breath: Secondary | ICD-10-CM

## 2019-05-05 NOTE — Patient Instructions (Signed)
1.  Your oxygen level is good.  You do not need oxygen.  2.  We are checking for potential hereditary emphysema.  3.  You will need a chest x-ray and a heart test these have been scheduled for you.  4.  I will connect with Dr. Army Melia and let her know that you should be off of Zestril as that may be making you cough.  5.  We will see you back in 3 to 4 weeks time.  Call sooner should any new difficulties arise.

## 2019-05-05 NOTE — Progress Notes (Signed)
 Assessment & Plan:  1. Shortness of breath (Primary)  2. Pulmonary infiltrate - CXR 2view; Future   Patient Instructions  1.  Your oxygen  level is good.  You do not need oxygen .  2.  We are checking for potential hereditary emphysema.  3.  You will need a chest x-ray and a heart test these have been scheduled for you.  4.  I will connect with Dr. Justus and let her know that you should be off of Zestril  as that may be making you cough.  5.  We will see you back in 3 to 4 weeks time.  Call sooner should any new difficulties arise.  Please note: late entry documentation due to logistical difficulties during COVID-19 pandemic. This note is filed for information purposes only, and is not intended to be used for billing, nor does it represent the full scope/nature of the visit in question. Please see any associated scanned media linked to date of encounter for additional pertinent information.  Subjective:    HPI: David Rhodes is a 63 y.o. male presenting to the pulmonology clinic on 05/05/2019 with report of: Pulmonary Consult (Referred by Dr. Anthon Lily for eval of Leukocytosis. Pt with known COPD. He states today his breathing is manageable. He uses his albuterol  inhaler once per day on average. He has some cough with yellow sputum. )     Outpatient Encounter Medications as of 05/05/2019  Medication Sig   loratadine  (CLARITIN ) 10 MG tablet Take 1 tablet (10 mg total) by mouth daily.   [DISCONTINUED] albuterol  (PROVENTIL  HFA;VENTOLIN  HFA) 108 (90 Base) MCG/ACT inhaler INHALE 1 TO 2 PUFFS BY MOUTH EVERY 6 HOURS AS NEEDED FOR WHEEZING OR SHORTNESS OF BREATH (Patient taking differently: Inhale 1-2 puffs into the lungs every 6 (six) hours as needed for wheezing or shortness of breath. )   [DISCONTINUED] baclofen  (LIORESAL ) 10 MG tablet TAKE 1 TABLET(10 MG) BY MOUTH THREE TIMES DAILY   [DISCONTINUED] BREO ELLIPTA  100-25 MCG/INH AEPB INHALE 1 PUFF INTO THE LUNGS DAILY  (Patient taking differently: Inhale 1 puff into the lungs daily. )   [DISCONTINUED] gabapentin  (NEURONTIN ) 600 MG tablet TAKE 1 TABLET(600 MG) BY MOUTH FOUR TIMES DAILY (Patient taking differently: Take 600 mg by mouth 4 (four) times daily. )   [DISCONTINUED] HYDROcodone -acetaminophen  (NORCO/VICODIN) 5-325 MG tablet Take 1-2 tablets by mouth every 4 (four) hours as needed for severe pain ((score 7 to 10)).   [DISCONTINUED] hydrocortisone  2.5 % lotion Apply topically 2 (two) times daily. (Patient not taking: Reported on 06/19/2019)   [DISCONTINUED] lisinopril  (ZESTRIL ) 40 MG tablet Take 1 tablet (40 mg total) by mouth daily.   [DISCONTINUED] meloxicam  (MOBIC ) 15 MG tablet TAKE 1 TABLET(15 MG) BY MOUTH DAILY (Patient taking differently: Take 15 mg by mouth daily.)   [DISCONTINUED] montelukast  (SINGULAIR ) 10 MG tablet TAKE 1 TABLET(10 MG) BY MOUTH AT BEDTIME (Patient taking differently: Take 10 mg by mouth at bedtime. )   [DISCONTINUED] mupirocin  ointment (BACTROBAN ) 2 % Apply to affected area 3 times daily (Patient not taking: Reported on 06/19/2019)   [DISCONTINUED] omeprazole  (PRILOSEC) 20 MG capsule TAKE 1 CAPSULE BY MOUTH DAILY (Patient taking differently: Take 20 mg by mouth daily.)   [DISCONTINUED] rOPINIRole  (REQUIP ) 2 MG tablet TAKE 1 TABLET BY MOUTH TWICE DAILY (Patient taking differently: Take 2 mg by mouth 2 (two) times daily. )   [DISCONTINUED] SPIRIVA  HANDIHALER 18 MCG inhalation capsule INHALE CONTENTS OF 1 CAPSULE ONCE DAILY USING HANDIHALER (Patient taking differently: Place 18 mcg  into inhaler and inhale daily. )   No facility-administered encounter medications on file as of 05/05/2019.      Objective:   Vitals:   05/05/19 1555  BP: 128/88  Pulse: 94  Temp: 97.9 F (36.6 C)  Height: 6' (1.829 m)  Weight: 156 lb (70.8 kg)  SpO2: 99% Comment: on RA  TempSrc: Temporal  BMI (Calculated): 21.15     Physical exam documentation is limited by delayed entry of information.

## 2019-05-08 ENCOUNTER — Other Ambulatory Visit: Payer: Self-pay | Admitting: Internal Medicine

## 2019-05-09 ENCOUNTER — Inpatient Hospital Stay: Payer: Medicare HMO | Admitting: Hematology and Oncology

## 2019-05-10 LAB — BCR-ABL1 FISH
Cells Analyzed: 200
Cells Counted: 200

## 2019-05-17 ENCOUNTER — Ambulatory Visit: Payer: Medicare HMO | Attending: Pulmonary Disease

## 2019-05-19 ENCOUNTER — Ambulatory Visit (INDEPENDENT_AMBULATORY_CARE_PROVIDER_SITE_OTHER): Payer: Medicare HMO

## 2019-05-19 ENCOUNTER — Other Ambulatory Visit: Payer: Self-pay

## 2019-05-19 ENCOUNTER — Inpatient Hospital Stay: Payer: Medicare HMO | Attending: Hematology and Oncology | Admitting: Hematology and Oncology

## 2019-05-19 ENCOUNTER — Ambulatory Visit
Admission: EM | Admit: 2019-05-19 | Discharge: 2019-05-19 | Disposition: A | Discharge status: Home / Self Care | Payer: Medicare HMO | Attending: Family Medicine | Admitting: Family Medicine

## 2019-05-19 ENCOUNTER — Encounter: Payer: Self-pay | Admitting: Emergency Medicine

## 2019-05-19 ENCOUNTER — Telehealth: Payer: Self-pay

## 2019-05-19 DIAGNOSIS — J441 Chronic obstructive pulmonary disease with (acute) exacerbation: Secondary | ICD-10-CM | POA: Diagnosis not present

## 2019-05-19 DIAGNOSIS — D72829 Elevated white blood cell count, unspecified: Secondary | ICD-10-CM

## 2019-05-19 DIAGNOSIS — R0602 Shortness of breath: Secondary | ICD-10-CM

## 2019-05-19 DIAGNOSIS — R0600 Dyspnea, unspecified: Secondary | ICD-10-CM

## 2019-05-19 DIAGNOSIS — R05 Cough: Secondary | ICD-10-CM | POA: Diagnosis not present

## 2019-05-19 MED ORDER — PREDNISONE 50 MG PO TABS: ORAL_TABLET | ORAL | 0 refills | Status: DC

## 2019-05-19 MED ORDER — METHYLPREDNISOLONE SODIUM SUCC 125 MG IJ SOLR
125.0000 mg | Freq: Once | INTRAMUSCULAR | Status: AC
  Administered 2019-05-19: 13:00:00 125 mg via INTRAMUSCULAR

## 2019-05-19 NOTE — Discharge Instructions (Signed)
Inhalers as prescribed.  Steroid as prescribed. Start tomorrow.  If you worsen, go to the ER.  Take care  Dr. Lacinda Axon

## 2019-05-19 NOTE — Telephone Encounter (Addendum)
Patient presented to clinic for follow up appt. Upon arrival patient had audible wheezing, labored breathing, and SOB. Patient states this has been present x "a couple of days". Patient stumbling slightly with walking. Placed in wheelchair. O2 sats were 97%. Afebrile. Patientescorted to UC and report given.

## 2019-05-19 NOTE — ED Triage Notes (Signed)
Patient c/o shortness of breath and wheezing that started 3 days ago.

## 2019-05-19 NOTE — ED Provider Notes (Addendum)
[facility]    CSN: [id number] Arrival date & time: 05/19/19  1124  History   Chief Complaint Chief Complaint  Patient presents with  . Shortness of Breath   HPI  63 year old male presents with acute shortness of breath.  Patient sent from cancer center today given shortness of breath and recent leukocytosis.  Patient reports a 3-day history of shortness of breath and associated wheezing.  He has known COPD with possible asthma component.  He is followed by pulmonology.  He has recently been seen here and has also been hospitalized earlier this year.  He has had 5 visits either here or to the hospital for COPD since May.  He does not appear to be well controlled.  Patient is acutely dyspneic at this time.  He is unable to speak in full sentences.  He appears ill.  No fever.  No reports of cough.  No other reported symptoms.  No other complaints.  PMH, Surgical Hx, Family Hx, Social History reviewed and updated as below.  Past Medical History:  Diagnosis Date  . Allergy    Hay fever  . Anxiety   . Asthma   . Chronic pain due to trauma   . COPD (chronic obstructive pulmonary disease) (HCC)   . Depression   . Essential tremor   . GERD (gastroesophageal reflux disease)   . Headache(784.0)    neck pain  . Hypertension   . 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 Inguinal hernia    bilateral  . Neuromuscular disorder (HCC)    hx of tremors  . Pneumonia    many years ago  . Shortness of breath    when allergies flare up  . Urine incontinence   . Wears glasses    Patient Active Problem List   Diagnosis Date Noted  . Leukocytosis 04/15/2019  . COPD exacerbation (HCC) 12/30/2018  . Acute on chronic respiratory failure with hypoxemia (HCC) 12/25/2018  . Inguinal hernia recurrent bilateral 11/16/2018  . Chronic left shoulder pain 10/05/2018  . Tremor 08/10/2018  . PICC (peripherally inserted central catheter) in place 04/16/2018  . Essential tremor  10/18/2017  . Cervical radiculopathy 07/23/2017  . Bilateral temporomandibular joint pain 07/23/2017  . Neck pain 05/19/2017  . Medication monitoring encounter 09/11/2016  . Pulmonary nodules 12/31/2015  . COPD (chronic obstructive pulmonary disease) (HCC) 12/27/2015  . Chronic cough 12/27/2015  . Asthma 09/24/2015  . Allergic rhinitis 11/11/2014  . Nerve root pain 11/11/2014  . Dyskinesia 11/11/2014  . Acid reflux 11/11/2014  . Calcium blood increased 11/11/2014  . Affective disorder (HCC) 11/11/2014  . Essential hypertension 12/25/2011  . Chronic back pain 12/25/2011   Past Surgical History:  Procedure Laterality Date  . DEEP BRAIN STIMULATOR PLACEMENT    . INGUINAL HERNIA REPAIR Bilateral 02/07/2014   Procedure:  BILATERAL INGUINAL HERNIA REPAIR;  Surgeon: Axel FillerArmando Ramirez, MD;  Location: [facility] OR;  Service: General;  Laterality: Bilateral;  . INSERTION OF MESH Bilateral 02/07/2014   Procedure: INSERTION OF MESH;  Surgeon: Axel FillerArmando Ramirez, MD;  Location: [facility] OR;  Service: General;  Laterality: Bilateral;  . NASAL SINUS SURGERY    . PULSE GENERATOR IMPLANT Left 09/11/2016   Procedure: Left chest-Change implantable pulse generator battery;  Surgeon: Maeola HarmanJoseph Stern, MD;  [facility];  Service: Neurosurgery;  Laterality: Left;  Left chest-Change implantable pulse generator battery  . PULSE GENERATOR IMPLANT Left 04/06/2018   Procedure: Revision of left chest implantable pulse generator, extension, and pocket adapter;  Surgeon: Maeola Harman, MD;  Location: [facility] OR;  Service: Neurosurgery;  Laterality: Left;  . SUBTHALAMIC STIMULATOR BATTERY REPLACEMENT  12/09/2011   Procedure: SUBTHALAMIC STIMULATOR BATTERY REPLACEMENT;  Surgeon: Maeola Harman, MD;  Location: [facility];  Service: Neurosurgery;  Laterality: N/A;   deep brain stimulator, implantable pulse generator change  . SUBTHALAMIC STIMULATOR BATTERY REPLACEMENT Left 04/21/2014   Procedure: Deep brain stimulator battery change;  Surgeon:  Maeola Harman, MD;  Location: [facility];  Service: Neurosurgery;  Laterality: Left;  Deep brain stimulator battery change  . SUBTHALAMIC STIMULATOR BATTERY REPLACEMENT N/A 08/10/2018   Procedure: Replacement of deep brain stimulator pulse generator/left abdomen with long lead extensions;  Surgeon: Maeola Harman, MD;  Location: [facility] OR;  Service: Neurosurgery;  Laterality: N/A;    Home Medications    Prior to Admission medications   Medication Sig Start Date End Date Taking? Authorizing Provider  albuterol (PROVENTIL HFA;VENTOLIN HFA) 108 (90 Base) MCG/ACT inhaler INHALE 1 TO 2 PUFFS BY MOUTH EVERY 6 HOURS AS NEEDED FOR WHEEZING OR SHORTNESS OF BREATH Patient taking differently: Inhale 1-2 puffs into the lungs every 6 (six) hours as needed for wheezing or shortness of breath.  07/02/18  Yes Reubin Milan, MD  baclofen (LIORESAL) 10 MG tablet TAKE 1 TABLET(10 MG) BY MOUTH THREE TIMES DAILY 05/08/19  Yes Reubin Milan, MD  BREO ELLIPTA 100-25 MCG/INH AEPB INHALE 1 PUFF INTO THE LUNGS DAILY 12/05/18  Yes Reubin Milan, MD  gabapentin (NEURONTIN) 600 MG tablet TAKE 1 TABLET(600 MG) BY MOUTH FOUR TIMES DAILY Patient taking differently: Take 600 mg by mouth 4 (four) times daily.  11/09/18  Yes Reubin Milan, MD  HYDROcodone-acetaminophen (NORCO/VICODIN) 5-325 MG tablet Take 1-2 tablets by mouth every 4 (four) hours as needed for severe pain ((score 7 to 10)). 08/11/18  Yes Maeola Harman, MD  hydrocortisone 2.5 % lotion Apply topically 2 (two) times daily. 01/04/19  Yes Triplett, Cari B, FNP  lisinopril (ZESTRIL) 40 MG tablet Take 1 tablet (40 mg total) by mouth daily. 12/05/18  Yes Reubin Milan, MD  loratadine (CLARITIN) 10 MG tablet Take 1 tablet (10 mg total) by mouth daily. 10/13/17  Yes Enedina Finner, MD  meloxicam (MOBIC) 15 MG tablet TAKE 1 TABLET(15 MG) BY MOUTH DAILY 12/06/18  Yes Reubin Milan, MD  montelukast (SINGULAIR) 10 MG tablet TAKE 1 TABLET(10 MG) BY MOUTH AT BEDTIME 05/08/19   Yes Reubin Milan, MD  mupirocin ointment (BACTROBAN) 2 % Apply to affected area 3 times daily 01/04/19 01/04/20 Yes Triplett, Cari B, FNP  omeprazole (PRILOSEC) 20 MG capsule TAKE 1 CAPSULE BY MOUTH DAILY Patient taking differently: Take 20 mg by mouth daily.  12/05/18  Yes Reubin Milan, MD  rOPINIRole (REQUIP) 2 MG tablet TAKE 1 TABLET BY MOUTH TWICE DAILY Patient taking differently: Take 2 mg by mouth 2 (two) times daily.  10/10/18  Yes Reubin Milan, MD  SPIRIVA HANDIHALER 18 MCG inhalation capsule INHALE CONTENTS OF 1 CAPSULE ONCE DAILY USING HANDIHALER Patient taking differently: Place 18 mcg into inhaler and inhale daily.  10/02/18  Yes Reubin Milan, MD  predniSONE (DELTASONE) 50 MG tablet 1 tablet daily x 5 days 05/19/19   Tommie Sams, DO    Family History Family History  Problem Relation Age of Onset  . Myoclonus Mother   . Breast cancer Mother   . Prostate cancer Father   . Heart disease Paternal Grandfather   . Myoclonus Maternal Uncle  Social History Social History   Tobacco Use  . Smoking status: Never Smoker  . Smokeless tobacco: Current User    Types: Chew  Substance Use Topics  . Alcohol use: No    Alcohol/week: 0.0 standard drinks  . Drug use: No     Allergies   Penicillins   Review of Systems Review of Systems  Constitutional: Negative for fever.  Respiratory: Positive for shortness of breath and wheezing.    Physical Exam Triage Vital Signs ED Triage Vitals  Enc Vitals Group     BP 05/19/19 1142 (!) 154/117     Pulse Rate 05/19/19 1142 (!) 104     Resp 05/19/19 1142 (!) 28     Temp 05/19/19 1142 97.7 F (36.5 C)     Temp Source 05/19/19 1142 Oral     SpO2 05/19/19 1142 95 %     Weight 05/19/19 1144 160 lb (72.6 kg)     Height 05/19/19 1144 6' (1.829 m)     Head Circumference --      Peak Flow --      Pain Score 05/19/19 1143 5     Pain Loc --      Pain Edu? --      Excl. in [place]? --    Updated Vital Signs BP (!) 154/117  (BP Location: Right Arm)   Pulse (!) 104   Temp 97.7 F (36.5 C) (Oral)   Resp (!) 28   Ht 6' (1.829 m)   Wt 72.6 kg   SpO2 95%   BMI 21.70 kg/m   Visual Acuity Right Eye Distance:   Left Eye Distance:   Bilateral Distance:    Right Eye Near:   Left Eye Near:    Bilateral Near:     Physical Exam Vitals signs and nursing note reviewed.  Constitutional:      Comments: Appears ill and acutely dyspneic. Chronically ill-appearing.  Cachectic.  HENT:     Head: Normocephalic and atraumatic.  Eyes:     General:        Right eye: No discharge.        Left eye: No discharge.     Conjunctiva/sclera: Conjunctivae normal.  Cardiovascular:     Rate and Rhythm: Regular rhythm. Tachycardia present.  Pulmonary:     Comments: Retractions noted.  Increased work of breathing.  Diffuse expiratory wheezing. Abdominal:     General: There is no distension.     Palpations: Abdomen is soft.     Tenderness: There is no abdominal tenderness.  Neurological:     General: No focal deficit present.     Mental Status: He is alert and oriented to person, place, and time.  Psychiatric:        Mood and Affect: Mood normal.        Behavior: Behavior normal.    UC Treatments / Results  Labs (all labs ordered are listed, but only abnormal results are displayed) Labs Reviewed  NOVEL CORONAVIRUS, NAA (HOSP ORDER, SEND-OUT TO REF LAB; TAT 18-24 HRS)    EKG   Radiology Dg Chest 2 View  Result Date: 05/19/2019 CLINICAL DATA:  Cough and shortness of breath EXAM: CHEST - 2 VIEW COMPARISON:  May 05, 2019 FINDINGS: There is no edema or consolidation. Heart size and pulmonary vascularity are normal. No adenopathy. There is lower thoracic levoscoliosis. IMPRESSION: No edema or consolidation.  Stable cardiac silhouette. Electronically Signed   By: Lowella Grip III M.D.   On: 05/19/2019 13:03  Procedures Procedures (including critical care time)  Medications Ordered in UC Medications   methylPREDNISolone sodium succinate (SOLU-MEDROL) 125 mg/2 mL injection 125 mg (125 mg Intramuscular Given 05/19/19 1301)    Initial Impression / Assessment and Plan / UC Course  I have reviewed the triage vital signs and the nursing notes.  Pertinent labs & imaging results that were available during my care of the patient were reviewed by me and considered in my medical decision making (see chart for details).    63 year old male presents with acute dyspnea secondary to COPD exacerbation.  I advised the patient that he needs to go to the hospital for further evaluation and management given increased work of breathing and exam findings.  Patient refused to go via EMS.  He initially agreed to go to the hospital via private vehicle but subsequently informed nursing staff that he was not going to go to the hospital.  As a result, patient was evaluated and managed here as best as I could.  Chest x-ray negative.  No albuterol treatments given due to current COVID-19 pandemic.  Awaiting Covid test results.  Solu-Medrol given.  Discharging home on prednisone.  Advised use of home inhalers.  Advised to go to the hospital if he fails to improve or worsens.  Final Clinical Impressions(s) / UC Diagnoses   Final diagnoses:  COPD exacerbation (HCC)  Acute dyspnea     Discharge Instructions     Inhalers as prescribed.  Steroid as prescribed. Start tomorrow.  If you worsen, go to the ER.  Take care  Dr. Adriana Simas    ED Prescriptions    Medication Sig Dispense Auth. Provider   predniSONE (DELTASONE) 50 MG tablet 1 tablet daily x 5 days 5 tablet Everlene Other G, DO     PDMP not reviewed this encounter.     Tommie Sams, [place] 05/19/19 1434

## 2019-05-20 LAB — NOVEL CORONAVIRUS, NAA (HOSP ORDER, SEND-OUT TO REF LAB; TAT 18-24 HRS): SARS-CoV-2, NAA: NOT DETECTED

## 2019-05-23 ENCOUNTER — Encounter: Payer: Self-pay | Admitting: Internal Medicine

## 2019-05-23 ENCOUNTER — Ambulatory Visit (INDEPENDENT_AMBULATORY_CARE_PROVIDER_SITE_OTHER): Payer: Medicare HMO | Admitting: Internal Medicine

## 2019-05-23 ENCOUNTER — Other Ambulatory Visit: Payer: Self-pay

## 2019-05-23 VITALS — BP 132/88 | HR 90 | Ht 72.0 in | Wt 156.0 lb

## 2019-05-23 DIAGNOSIS — M25512 Pain in left shoulder: Secondary | ICD-10-CM | POA: Diagnosis not present

## 2019-05-23 DIAGNOSIS — M25511 Pain in right shoulder: Secondary | ICD-10-CM | POA: Diagnosis not present

## 2019-05-23 DIAGNOSIS — M5412 Radiculopathy, cervical region: Secondary | ICD-10-CM

## 2019-05-23 MED ORDER — PREDNISONE 10 MG PO TABS: 10.0000 mg | ORAL_TABLET | ORAL | 0 refills | Status: AC
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
## 2019-05-24 ENCOUNTER — Telehealth: Payer: Self-pay | Admitting: Pulmonary Disease

## 2019-05-24 NOTE — Telephone Encounter (Signed)
Called was transferred from front desk staff, as pt stated he had no idea about his appointment being canceled with our office for tomorrow at 9:00. Pt stated that he thought I was referring to his appt for today at 3:00 with a different office when speaking with him earlier. I explained to pt that with his current symptoms, we can not have him come into our office. This is per Dr. Patsey Berthold verbally.  I offered to reschedule appt to a later date, pt became upset and stated that we could delete his records and to "bite him".

## 2019-05-24 NOTE — Telephone Encounter (Signed)
Per Dr. Patsey Berthold- pt needs to be seen at ED and evaluated for covid with current symptoms. Pt will need a negative test prior to coming into office.  I have spoke to pt and relayed recommendations. 05/25/2019 OV has bene canceled. Pt would like to call back to reschedule.  Nothing further is needed.

## 2019-05-25 ENCOUNTER — Ambulatory Visit: Payer: Medicare HMO | Admitting: Pulmonary Disease

## 2019-05-29 NOTE — Progress Notes (Signed)
This encounter was created in error - please disregard.

## 2019-06-02 ENCOUNTER — Telehealth: Payer: Self-pay | Admitting: Pulmonary Disease

## 2019-06-02 NOTE — Telephone Encounter (Signed)
Per Dr. Patsey Berthold- alpha- 1 is normal.  Left message to make pt aware.

## 2019-06-03 NOTE — Telephone Encounter (Signed)
Pt is aware of results and voiced his understanding. Nothing further is needed.  

## 2019-06-16 ENCOUNTER — Other Ambulatory Visit: Payer: Self-pay

## 2019-06-16 MED ORDER — ALBUTEROL SULFATE HFA 108 (90 BASE) MCG/ACT IN AERS: 2.0000 | INHALATION_SPRAY | Freq: Four times a day (QID) | RESPIRATORY_TRACT | 5 refills | Status: DC | PRN

## 2019-06-19 ENCOUNTER — Observation Stay (HOSPITAL_COMMUNITY)
Admission: EM | Admit: 2019-06-19 | Discharge: 2019-06-20 | Payer: Medicare HMO | Attending: Internal Medicine | Admitting: Internal Medicine

## 2019-06-19 ENCOUNTER — Encounter (HOSPITAL_COMMUNITY): Payer: Self-pay | Admitting: Internal Medicine

## 2019-06-19 ENCOUNTER — Emergency Department (HOSPITAL_COMMUNITY): Payer: Medicare HMO

## 2019-06-19 ENCOUNTER — Other Ambulatory Visit: Payer: Self-pay

## 2019-06-19 DIAGNOSIS — G8921 Chronic pain due to trauma: Secondary | ICD-10-CM | POA: Insufficient documentation

## 2019-06-19 DIAGNOSIS — Z880 Allergy status to penicillin: Secondary | ICD-10-CM | POA: Insufficient documentation

## 2019-06-19 DIAGNOSIS — Z20828 Contact with and (suspected) exposure to other viral communicable diseases: Secondary | ICD-10-CM | POA: Insufficient documentation

## 2019-06-19 DIAGNOSIS — I10 Essential (primary) hypertension: Secondary | ICD-10-CM | POA: Diagnosis present

## 2019-06-19 DIAGNOSIS — J45909 Unspecified asthma, uncomplicated: Secondary | ICD-10-CM | POA: Diagnosis not present

## 2019-06-19 DIAGNOSIS — J9601 Acute respiratory failure with hypoxia: Secondary | ICD-10-CM | POA: Diagnosis present

## 2019-06-19 DIAGNOSIS — K219 Gastro-esophageal reflux disease without esophagitis: Secondary | ICD-10-CM | POA: Insufficient documentation

## 2019-06-19 DIAGNOSIS — G2581 Restless legs syndrome: Secondary | ICD-10-CM | POA: Diagnosis not present

## 2019-06-19 DIAGNOSIS — G250 Essential tremor: Secondary | ICD-10-CM | POA: Diagnosis not present

## 2019-06-19 DIAGNOSIS — Z79899 Other long term (current) drug therapy: Secondary | ICD-10-CM | POA: Diagnosis not present

## 2019-06-19 DIAGNOSIS — F329 Major depressive disorder, single episode, unspecified: Secondary | ICD-10-CM | POA: Diagnosis not present

## 2019-06-19 DIAGNOSIS — R0602 Shortness of breath: Secondary | ICD-10-CM | POA: Diagnosis present

## 2019-06-19 DIAGNOSIS — Z7951 Long term (current) use of inhaled steroids: Secondary | ICD-10-CM | POA: Diagnosis not present

## 2019-06-19 DIAGNOSIS — J449 Chronic obstructive pulmonary disease, unspecified: Secondary | ICD-10-CM | POA: Diagnosis not present

## 2019-06-19 DIAGNOSIS — J9621 Acute and chronic respiratory failure with hypoxia: Secondary | ICD-10-CM | POA: Diagnosis not present

## 2019-06-19 DIAGNOSIS — F419 Anxiety disorder, unspecified: Secondary | ICD-10-CM | POA: Insufficient documentation

## 2019-06-19 DIAGNOSIS — Z881 Allergy status to other antibiotic agents status: Secondary | ICD-10-CM | POA: Insufficient documentation

## 2019-06-19 DIAGNOSIS — J441 Chronic obstructive pulmonary disease with (acute) exacerbation: Secondary | ICD-10-CM | POA: Diagnosis not present

## 2019-06-19 LAB — BASIC METABOLIC PANEL
Anion gap: 8 (ref 5–15)
BUN: 8 mg/dL (ref 8–23)
CO2: 27 mmol/L (ref 22–32)
Calcium: 10.4 mg/dL — ABNORMAL HIGH (ref 8.9–10.3)
Chloride: 105 mmol/L (ref 98–111)
Creatinine, Ser: 1.05 mg/dL (ref 0.61–1.24)
GFR calc Af Amer: >60 mL/min (ref >60)
GFR calc non Af Amer: >60 mL/min (ref >60)
Glucose, Bld: 116 mg/dL — ABNORMAL HIGH (ref 70–99)
Potassium: 3.9 mmol/L (ref 3.5–5.1)
Sodium: 140 mmol/L (ref 135–145)

## 2019-06-19 LAB — CBC
HCT: 47.7 % (ref 39.0–52.0)
Hemoglobin: 15.7 g/dL (ref 13.0–17.0)
MCH: 29.2 pg (ref 26.0–34.0)
MCHC: 32.9 g/dL (ref 30.0–36.0)
MCV: 88.8 fL (ref 80.0–100.0)
Platelets: 244 10*3/uL (ref 150–400)
RBC: 5.37 MIL/uL (ref 4.22–5.81)
RDW: 13.5 % (ref 11.5–15.5)
WBC: 13.1 10*3/uL — ABNORMAL HIGH (ref 4.0–10.5)
nRBC: 0 % (ref 0.0–0.2)

## 2019-06-19 LAB — POC SARS CORONAVIRUS 2 AG -  ED: SARS Coronavirus 2 Ag: NEGATIVE

## 2019-06-19 LAB — RESPIRATORY PANEL BY PCR

## 2019-06-19 LAB — TROPONIN I (HIGH SENSITIVITY)
Troponin I (High Sensitivity): 3 ng/L (ref <18)
Troponin I (High Sensitivity): <2 ng/L (ref <18)

## 2019-06-19 LAB — RESPIRATORY PANEL BY RT PCR (FLU A&B, COVID)
Influenza A by PCR: NEGATIVE
Influenza B by PCR: NEGATIVE
SARS Coronavirus 2 by RT PCR: NEGATIVE

## 2019-06-19 MED ORDER — LISINOPRIL 20 MG PO TABS: 40.0000 mg | ORAL_TABLET | Freq: Every day | ORAL | Status: DC

## 2019-06-19 MED ORDER — GABAPENTIN 300 MG PO CAPS
600.0000 mg | ORAL_CAPSULE | Freq: Three times a day (TID) | ORAL | Status: DC
  Filled 2019-06-19: qty 2

## 2019-06-19 MED ORDER — TIOTROPIUM BROMIDE MONOHYDRATE 18 MCG IN CAPS
18.0000 ug | ORAL_CAPSULE | Freq: Every day | RESPIRATORY_TRACT | Status: DC
  Filled 2019-06-19: qty 5

## 2019-06-19 MED ORDER — METHYLPREDNISOLONE SODIUM SUCC 125 MG IJ SOLR
125.0000 mg | Freq: Once | INTRAMUSCULAR | Status: AC
  Administered 2019-06-19: 125 mg via INTRAVENOUS
  Filled 2019-06-19: qty 2

## 2019-06-19 MED ORDER — ALBUTEROL SULFATE HFA 108 (90 BASE) MCG/ACT IN AERS
8.0000 | INHALATION_SPRAY | RESPIRATORY_TRACT | Status: DC | PRN
  Administered 2019-06-19: 09:00:00 8 via RESPIRATORY_TRACT
  Filled 2019-06-19: qty 6.7

## 2019-06-19 MED ORDER — FLUTICASONE FUROATE-VILANTEROL 100-25 MCG/INH IN AEPB
1.0000 | INHALATION_SPRAY | Freq: Every day | RESPIRATORY_TRACT | Status: DC
  Filled 2019-06-19: qty 28

## 2019-06-19 MED ORDER — UMECLIDINIUM BROMIDE 62.5 MCG/INH IN AEPB
1.0000 | INHALATION_SPRAY | Freq: Every day | RESPIRATORY_TRACT | Status: DC
  Filled 2019-06-19: qty 7

## 2019-06-19 MED ORDER — MONTELUKAST SODIUM 10 MG PO TABS
10.0000 mg | ORAL_TABLET | Freq: Every day | ORAL | Status: DC
  Filled 2019-06-19: qty 1

## 2019-06-19 MED ORDER — ACETAMINOPHEN 650 MG RE SUPP: 650.0000 mg | Freq: Four times a day (QID) | RECTAL | Status: DC | PRN

## 2019-06-19 MED ORDER — IPRATROPIUM-ALBUTEROL 0.5-2.5 (3) MG/3ML IN SOLN
3.0000 mL | Freq: Four times a day (QID) | RESPIRATORY_TRACT | Status: DC
  Administered 2019-06-19 – 2019-06-20 (×3): 3 mL via RESPIRATORY_TRACT
  Filled 2019-06-19 (×3): qty 3

## 2019-06-19 MED ORDER — ENOXAPARIN SODIUM 40 MG/0.4ML ~~LOC~~ SOLN
40.0000 mg | SUBCUTANEOUS | Status: DC
  Administered 2019-06-19: 40 mg via SUBCUTANEOUS
  Filled 2019-06-19: qty 0.4

## 2019-06-19 MED ORDER — ALBUTEROL (5 MG/ML) CONTINUOUS INHALATION SOLN
10.0000 mg/h | INHALATION_SOLUTION | Freq: Once | RESPIRATORY_TRACT | Status: AC
  Administered 2019-06-19: 10 mg/h via RESPIRATORY_TRACT

## 2019-06-19 MED ORDER — SODIUM CHLORIDE 0.9% FLUSH
3.0000 mL | Freq: Once | INTRAVENOUS | Status: AC
  Administered 2019-06-19: 3 mL via INTRAVENOUS

## 2019-06-19 MED ORDER — ACETAMINOPHEN 325 MG PO TABS: 650.0000 mg | ORAL_TABLET | Freq: Four times a day (QID) | ORAL | Status: DC | PRN

## 2019-06-19 MED ORDER — AZITHROMYCIN 250 MG PO TABS
500.0000 mg | ORAL_TABLET | Freq: Every day | ORAL | Status: AC
  Administered 2019-06-19: 500 mg via ORAL
  Filled 2019-06-19: qty 2

## 2019-06-19 MED ORDER — PREDNISONE 20 MG PO TABS: 40.0000 mg | ORAL_TABLET | Freq: Every day | ORAL | Status: DC

## 2019-06-19 MED ORDER — BACLOFEN 10 MG PO TABS
10.0000 mg | ORAL_TABLET | Freq: Three times a day (TID) | ORAL | Status: DC
  Filled 2019-06-19 (×3): qty 1

## 2019-06-19 MED ORDER — ROPINIROLE HCL 1 MG PO TABS
2.0000 mg | ORAL_TABLET | Freq: Two times a day (BID) | ORAL | Status: DC
  Administered 2019-06-19: 2 mg via ORAL
  Filled 2019-06-19 (×3): qty 2

## 2019-06-19 MED ORDER — PANTOPRAZOLE SODIUM 40 MG PO TBEC: 40.0000 mg | DELAYED_RELEASE_TABLET | Freq: Every day | ORAL | Status: DC

## 2019-06-19 MED ORDER — AZITHROMYCIN 250 MG PO TABS: 250.0000 mg | ORAL_TABLET | Freq: Every day | ORAL | Status: DC

## 2019-06-19 NOTE — ED Notes (Signed)
Could not obtain EKG due to brain stimulator. MD aware

## 2019-06-19 NOTE — Progress Notes (Signed)
Paged by RN regarding patient wanting to leave AMA due to medication and oxygen concerns.   Discussed with patient at bedside. He was wondering why he could not just get a prescription for oxygen and go. We explained process of having case manager contact [place] agencies that have oxygen available. He notes that he needs oxygen to be transportable and is very interested in the backpack type that he's seen on TV. Stressed that 1st priority is getting him home on oxygen and afterwards he can pursue more compact versions. He expressed understanding and willing to stay.   Mr. Dalesandro was also upset that he could not take his home Gabapentin and Ropinirole, which is at bedside, as its already paid for and he does not want to pay for hospital's pills. Explained hospital policy to have it taken down pharmacy if he wants to take his. He is not okay with this unless he can go to pharmacy and watch it be counted. Compromised with having medications stored in med room on 2W floor. He is not okay with taking hospital pills at this time.   Dr. Jose Persia Internal Medicine PGY-1  Pager: [phone] 06/19/2019, 11:46 PM

## 2019-06-19 NOTE — H&P (Signed)
Date: 06/19/2019               Patient Name:  AKARI CRYSLER MRN: [id number]  DOB: 05-25-1956 Age / Sex: 63 y.o., male   PCP: Reubin Milan, MD         Medical Service: Internal Medicine Teaching Service         Attending Physician: Dr. Sandre Kitty Elwin Mocha, MD    First Contact: Dr. Cleaster Corin Pager: [phone]  Second Contact: Dr. Barbaraann Faster Pager: [phone]       After Hours (After 5p/  First Contact Pager: [phone]  weekends / holidays): Second Contact Pager: [phone]   Chief Complaint: shortness of breath  History of Present Illness: ELNATHAN FULFORD is a 63 year old male with past medical history of HTN, RLS, essential tremor,  asthma-COPD overlap syndrome, and lung nodules who present with SOB. SOB came on about four days ago and has gotten progressively worse. Unaware of any exposure which could have brought this on. Says he has had allergy testing in the past and is allergic to everything. He has been having chest pain and abdominal pain related to the cough. He gets coughing whenever he has flare of his lung condition.This all really started in his 20s and has gotten worse and worse over time. He has never smoked. He's worked in Scientist, research (life sciences), Financial risk analyst, Aeronautical engineer, Holiday representative. Reports he sees pulmonology for his COPD, but is not sure exactly what inhalers he takes. He has been around a lot of smokers over the years, but denies history of ever smoking. Denies history of blood clots. He denies changes in urination, changes in bowel movements, or sore throat.    Meds:  Current Meds  Medication Sig  . albuterol (VENTOLIN HFA) 108 (90 Base) MCG/ACT inhaler Inhale 2 puffs into the lungs every 6 (six) hours as needed for wheezing or shortness of breath.  . baclofen (LIORESAL) 10 MG tablet TAKE 1 TABLET(10 MG) BY MOUTH THREE TIMES DAILY (Patient taking differently: Take 10 mg by mouth 3 (three) times daily. )  . BREO ELLIPTA 100-25 MCG/INH AEPB INHALE 1 PUFF INTO THE LUNGS DAILY  (Patient taking differently: Inhale 1 puff into the lungs daily. )  . gabapentin (NEURONTIN) 600 MG tablet TAKE 1 TABLET(600 MG) BY MOUTH FOUR TIMES DAILY (Patient taking differently: Take 600 mg by mouth 4 (four) times daily. )  . HYDROcodone-acetaminophen (NORCO/VICODIN) 5-325 MG tablet Take 1-2 tablets by mouth every 4 (four) hours as needed for severe pain ((score 7 to 10)).  Marland Kitchen lisinopril (ZESTRIL) 40 MG tablet Take 1 tablet (40 mg total) by mouth daily.  Marland Kitchen loratadine (CLARITIN) 10 MG tablet Take 1 tablet (10 mg total) by mouth daily.  . meloxicam (MOBIC) 15 MG tablet TAKE 1 TABLET(15 MG) BY MOUTH DAILY (Patient taking differently: Take 15 mg by mouth daily. )  . montelukast (SINGULAIR) 10 MG tablet TAKE 1 TABLET(10 MG) BY MOUTH AT BEDTIME (Patient taking differently: Take 10 mg by mouth at bedtime. )  . omeprazole (PRILOSEC) 20 MG capsule TAKE 1 CAPSULE BY MOUTH DAILY (Patient taking differently: Take 20 mg by mouth daily. )  . rOPINIRole (REQUIP) 2 MG tablet TAKE 1 TABLET BY MOUTH TWICE DAILY (Patient taking differently: Take 2 mg by mouth 2 (two) times daily. )  . SPIRIVA HANDIHALER 18 MCG inhalation capsule INHALE CONTENTS OF 1 CAPSULE ONCE DAILY USING HANDIHALER (Patient taking differently: Place 18 mcg into inhaler and inhale daily. )     Allergies:  Allergies as of 06/19/2019 - Review Complete 06/19/2019  Allergen Reaction Noted  . Penicillins Shortness Of Breath 12/03/2011   Past Medical History:  Diagnosis Date  . Allergy    Hay fever  . Anxiety   . Asthma   . Chronic pain due to trauma   . COPD (chronic obstructive pulmonary disease) (HCC)   . Depression   . Essential tremor   . GERD (gastroesophageal reflux disease)   . Headache(784.0)    neck pain  . Hypertension   . 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 Inguinal hernia    bilateral  . Pneumonia    many years ago  . Shortness of breath    when allergies flare up  . Urine incontinence   . Wears glasses      Social: Retired. 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   Substance use: Never smoker, denies alcohol use, and drug use.   Family History:  Family History  Problem Relation Age of Onset  . Myoclonus Mother   . Breast cancer Mother   . Prostate cancer Father   . Heart disease Paternal Grandfather   . Myoclonus Maternal Uncle     Review of Systems: A complete ROS was negative except as per HPI.   Physical Exam: Blood pressure (!) 146/103, pulse 84, temperature 98 F (36.7 C), temperature source Oral, resp. rate 20, height 6' (1.829 m), weight 70.8 kg, SpO2 95 %.  Physical Exam  Constitutional: He is oriented to person, place, and time. He appears well-developed. No distress.  HENT:  Head: Normocephalic and atraumatic.  Mouth/Throat: No oropharyngeal exudate.  Edentulous  Eyes: Conjunctivae are normal. Right eye exhibits no discharge. Left eye exhibits no discharge. No scleral icterus.  Neck: No tracheal deviation present.  Cardiovascular: Regular rhythm. Exam reveals no gallop and no friction rub.  No murmur heard. tachycardic  Respiratory: Effort normal. He has wheezes. He has no rales. He exhibits no tenderness.  Using accessory muscle  GI: Soft. Bowel sounds are normal.  Musculoskeletal:        General: No tenderness or edema.  Lymphadenopathy:    He has no cervical adenopathy.  Neurological: He is alert and oriented to person, place, and time. He displays tremor (with arm movement).  Skin: Skin is warm and dry. No rash noted.  Psychiatric: His behavior is normal. Thought content normal.     EKG: result pending  CXR: personally reviewed my interpretation is hyperexpanded , no focal opacities or consolidations.  Assessment & Plan by Problem: Active Problems:   Acute respiratory failure with hypoxia [facility](HCC)  Bradly BienenstockRonald E Hungate is a 63 year old male with past medical history of HTN, RLS, asthma-COPD overlap  syndrome, and lung nodules who present with SOB.   #Acute hypoxic respiratory failure #ACOS Given albuterol and solu-medrol in ED. Covid negative.  Requiring 4L  to maintain sats >92%.  He follows most currently with [facility].  Noted patient on Breo, Spiriva, and albuterol . Alpha -1 negative, attempted recent PFT's but patient was not able to make appointment.  Based on discussion with patient and on review of previous notes there may be a component of compliance to regimen. Mild leukocytosis, w/ history of leukocytosis seen by hematology in October who felt this was reactive with recent steroids. Xray w/o signs of pneumonia and afebrile.  - RVP  - Prednisone 40 mg - Azithromycin - Continue Breo Singulair  - Every 6  hours DuoNebs  #HTN - holding home lisinopril, reported stopped due to history of cough  #RLS -Continue home ropinirole and gabapentin  #Essential Tremor  Has deep brain stimulator   Diet:Regular VTE: Lovenox Code:Full  Dispo: Admit patient to Observation with expected length of stay less than 2 midnights.     Tamsen Snider, MD PGY1  [phone]

## 2019-06-19 NOTE — ED Notes (Signed)
Having trouble obtaining EKG . Changed cords and will switch out machine

## 2019-06-19 NOTE — ED Notes (Signed)
Pt ambulated to bathroom with no assistance, only saw him walking back from bathroom to room. Pt noted to be in distress. Placed pt back on monitor, oxygen 92/placed back on [place]/HR 150s.

## 2019-06-19 NOTE — ED Notes (Signed)
Pt's O2 88% on RA. Placed on 4L [place]. O2 now 96%

## 2019-06-19 NOTE — ED Provider Notes (Signed)
[facility] EM[facility]ENC[facility] Provider Note   CSN: [id number] Arrival date & time: 06/19/19  0110     History Chief Complaint  Patient presents with  . Shortness of Breath    David Rhodes is a 63 y.o. male.  Patient presents to the emergency department for evaluation of difficulty breathing.  Patient reports a history of asthma and COPD.  He has had increased cough that is productive of sputum as well as shortness of breath and wheezing.  He has not had any fever.  He denies chest pain.  Does have progressively worsened over a period of 1 week.        Past Medical History:  Diagnosis Date  . Allergy    Hay fever  . Anxiety   . Asthma   . Chronic pain due to trauma   . COPD (chronic obstructive pulmonary disease) (HCC)   . Depression   . Essential tremor   . GERD (gastroesophageal reflux disease)   . Headache(784.0)    neck pain  . Hypertension   . 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 Inguinal hernia    bilateral  . Neuromuscular disorder (HCC)    hx of tremors  . Pneumonia    many years ago  . Shortness of breath    when allergies flare up  . Urine incontinence   . Wears glasses     Patient Active Problem List   Diagnosis Date Noted  . Leukocytosis 04/15/2019  . COPD exacerbation (HCC) 12/30/2018  . Acute on chronic respiratory failure with hypoxemia (HCC) 12/25/2018  . Inguinal hernia recurrent bilateral 11/16/2018  . Chronic left shoulder pain 10/05/2018  . Tremor 08/10/2018  . PICC (peripherally inserted central catheter) in place 04/16/2018  . Essential tremor 10/18/2017  . Cervical radiculopathy 07/23/2017  . Bilateral temporomandibular joint pain 07/23/2017  . Neck pain 05/19/2017  . Medication monitoring encounter 09/11/2016  . Pulmonary nodules 12/31/2015  . COPD (chronic obstructive pulmonary disease) (HCC) 12/27/2015  . Chronic cough 12/27/2015  . Asthma 09/24/2015  . Allergic rhinitis 11/11/2014  .  Nerve root pain 11/11/2014  . Dyskinesia 11/11/2014  . Acid reflux 11/11/2014  . Calcium blood increased 11/11/2014  . Affective disorder (HCC) 11/11/2014  . Essential hypertension 12/25/2011  . Chronic back pain 12/25/2011    Past Surgical History:  Procedure Laterality Date  . DEEP BRAIN STIMULATOR PLACEMENT    . INGUINAL HERNIA REPAIR Bilateral 02/07/2014   Procedure:  BILATERAL INGUINAL HERNIA REPAIR;  Surgeon: Axel FillerArmando Ramirez, MD;  Location: [facility] OR;  Service: General;  Laterality: Bilateral;  . INSERTION OF MESH Bilateral 02/07/2014   Procedure: INSERTION OF MESH;  Surgeon: Axel FillerArmando Ramirez, MD;  Location: [facility] OR;  Service: General;  Laterality: Bilateral;  . NASAL SINUS SURGERY    . PULSE GENERATOR IMPLANT Left 09/11/2016   Procedure: Left chest-Change implantable pulse generator battery;  Surgeon: Maeola HarmanJoseph Stern, MD;  [facility];  Service: Neurosurgery;  Laterality: Left;  Left chest-Change implantable pulse generator battery  . PULSE GENERATOR IMPLANT Left 04/06/2018   Procedure: Revision of left chest implantable pulse generator, extension, and pocket adapter;  Surgeon: Maeola HarmanStern, Joseph, MD;  [facility];  Service: Neurosurgery;  Laterality: Left;  . SUBTHALAMIC STIMULATOR BATTERY REPLACEMENT  12/09/2011   Procedure: SUBTHALAMIC STIMULATOR BATTERY REPLACEMENT;  Surgeon: Maeola HarmanJoseph Stern, MD;  Location: [facility] NEURO ORS;  Service: Neurosurgery;  Laterality: N/A;   deep brain stimulator, implantable pulse generator change  . SUBTHALAMIC STIMULATOR BATTERY  REPLACEMENT Left 04/21/2014   Procedure: Deep brain stimulator battery change;  Surgeon: Maeola Harman, MD;  Location: [facility] NEUR[facility] ORS;  Service: Neurosurgery;  Laterality: Left;  Deep brain stimulator battery change  . SUBTHALAMIC STIMULATOR BATTERY REPLACEMENT N/A 08/10/2018   Procedure: Replacement of deep brain stimulator pulse generator/left abdomen with long lead extensions;  Surgeon: Maeola Harman, MD;  Location: [facility] OR;  Service:  Neurosurgery;  Laterality: N/A;       Family History  Problem Relation Age of Onset  . Myoclonus Mother   . Breast cancer Mother   . Prostate cancer Father   . Heart disease Paternal Grandfather   . Myoclonus Maternal Uncle     Social History   Tobacco Use  . Smoking status: Never Smoker  . Smokeless tobacco: Current User    Types: Chew  Substance Use Topics  . Alcohol use: No    Alcohol/week: 0.0 standard drinks  . Drug use: No    Home Medications Prior to Admission medications   Medication Sig Start Date End Date Taking? Authorizing Provider  albuterol (VENTOLIN HFA) 108 (90 Base) MCG/ACT inhaler Inhale 2 puffs into the lungs every 6 (six) hours as needed for wheezing or shortness of breath. 06/16/19   Reubin Milan, MD  baclofen (LIORESAL) 10 MG tablet TAKE 1 TABLET(10 MG) BY MOUTH THREE TIMES DAILY Patient taking differently: Take 10 mg by mouth 3 (three) times daily.  05/08/19   Reubin Milan, MD  BREO ELLIPTA 100-25 MCG/INH AEPB INHALE 1 PUFF INTO THE LUNGS DAILY Patient taking differently: Inhale 1 puff into the lungs daily.  12/05/18   Reubin Milan, MD  gabapentin (NEURONTIN) 600 MG tablet TAKE 1 TABLET(600 MG) BY MOUTH FOUR TIMES DAILY Patient taking differently: Take 600 mg by mouth 4 (four) times daily.  11/09/18   Reubin Milan, MD  HYDROcodone-acetaminophen (NORCO/VICODIN) 5-325 MG tablet Take 1-2 tablets by mouth every 4 (four) hours as needed for severe pain ((score 7 to 10)). 08/11/18   Maeola Harman, MD  hydrocortisone 2.5 % lotion Apply topically 2 (two) times daily. 01/04/19   Triplett, Cari B, FNP  lisinopril (ZESTRIL) 40 MG tablet Take 1 tablet (40 mg total) by mouth daily. 12/05/18   Reubin Milan, MD  loratadine (CLARITIN) 10 MG tablet Take 1 tablet (10 mg total) by mouth daily. 10/13/17   Enedina Finner, MD  meloxicam (MOBIC) 15 MG tablet TAKE 1 TABLET(15 MG) BY MOUTH DAILY 12/06/18   Reubin Milan, MD  montelukast (SINGULAIR) 10 MG tablet  TAKE 1 TABLET(10 MG) BY MOUTH AT BEDTIME 05/08/19   Reubin Milan, MD  mupirocin ointment (BACTROBAN) 2 % Apply to affected area 3 times daily 01/04/19 01/04/20  Triplett, Rulon Eisenmenger B, FNP  omeprazole (PRILOSEC) 20 MG capsule TAKE 1 CAPSULE BY MOUTH DAILY Patient taking differently: Take 20 mg by mouth daily.  12/05/18   Reubin Milan, MD  rOPINIRole (REQUIP) 2 MG tablet TAKE 1 TABLET BY MOUTH TWICE DAILY Patient taking differently: Take 2 mg by mouth 2 (two) times daily.  10/10/18   Reubin Milan, MD  SPIRIVA HANDIHALER 18 MCG inhalation capsule INHALE CONTENTS OF 1 CAPSULE ONCE DAILY USING HANDIHALER Patient taking differently: Place 18 mcg into inhaler and inhale daily.  10/02/18   Reubin Milan, MD    Allergies    Penicillins  Review of Systems   Review of Systems  Respiratory: Positive for cough, shortness of breath and wheezing.   All other systems  reviewed and are negative.   Physical Exam Updated Vital Signs BP (!) 163/108 (BP Location: Right Arm)   Pulse 82   Temp 98 F (36.7 C) (Oral)   Resp (!) 25   SpO2 96%   Physical Exam Vitals and nursing note reviewed.  Constitutional:      General: He is not in acute distress.    Appearance: Normal appearance. He is well-developed.  HENT:     Head: Normocephalic and atraumatic.     Right Ear: Hearing normal.     Left Ear: Hearing normal.     Nose: Nose normal.  Eyes:     Conjunctiva/sclera: Conjunctivae normal.     Pupils: Pupils are equal, round, and reactive to light.  Cardiovascular:     Rate and Rhythm: Regular rhythm.     Heart sounds: S1 normal and S2 normal. No murmur. No friction rub. No gallop.   Pulmonary:     Effort: Tachypnea and accessory muscle usage present.     Breath sounds: Decreased breath sounds and wheezing present.  Chest:     Chest wall: No tenderness.  Abdominal:     General: Bowel sounds are normal.     Palpations: Abdomen is soft.     Tenderness: There is no abdominal tenderness. There is  no guarding or rebound. Negative signs include Murphy's sign and McBurney's sign.     Hernia: No hernia is present.  Musculoskeletal:        General: Normal range of motion.     Cervical back: Normal range of motion and neck supple.  Skin:    General: Skin is warm and dry.     Findings: No rash.  Neurological:     Mental Status: He is alert and oriented to person, place, and time.     GCS: GCS eye subscore is 4. GCS verbal subscore is 5. GCS motor subscore is 6.     Cranial Nerves: No cranial nerve deficit.     Sensory: No sensory deficit.     Coordination: Coordination normal.  Psychiatric:        Speech: Speech normal.        Behavior: Behavior normal.        Thought Content: Thought content normal.     ED Results / Procedures / Treatments   Labs (all labs ordered are listed, but only abnormal results are displayed) Labs Reviewed  BASIC METABOLIC PANEL - Abnormal; Notable for the following components:      Result Value   Glucose, Bld 116 (*)    Calcium 10.4 (*)    All other components within normal limits  CBC - Abnormal; Notable for the following components:   WBC 13.1 (*)    All other components within normal limits  RESPIRATORY PANEL BY RT PCR (FLU A&B, COVID)  POC SARS CORONAVIRUS 2 AG -  ED  TROPONIN I (HIGH SENSITIVITY)  TROPONIN I (HIGH SENSITIVITY)    EKG None  Radiology DG Chest Portable 1 View  Result Date: 06/19/2019 CLINICAL DATA:  COPD and asthma. EXAM: PORTABLE CHEST 1 VIEW COMPARISON:  May 19, 2019 FINDINGS: The lungs are hyperexpanded. There is no focal infiltrate. No pneumothorax. No large pleural effusion. The heart size is stable from prior study. There is no acute osseous abnormality detected on this exam. IMPRESSION: Stable chest x-ray. No acute cardiopulmonary findings. Electronically Signed   By: Constance Holster M.D.   On: 06/19/2019 01:46    Procedures Procedures (including critical care time)  Medications Ordered in  ED Medications  sodium chloride flush (NS) 0.9 % injection 3 mL (has no administration in time range)  albuterol (VENTOLIN HFA) 108 (90 Base) MCG/ACT inhaler 8 puff (has no administration in time range)  methylPREDNISolone sodium succinate (SOLU-MEDROL) 125 mg/2 mL injection 125 mg (125 mg Intravenous Given 06/19/19 0451)    ED Course  I have reviewed the triage vital signs and the nursing notes.  Pertinent labs & imaging results that were available during my care of the patient were reviewed by me and considered in my medical decision making (see chart for details).    MDM Rules/Calculators/A&P                      Patient presents to the emergency department for evaluation of difficulty breathing.  Patient reports that he has been having trouble breathing for a week.  He has a history of COPD.  Patient has significant wheezing present at arrival.  He is requiring supplemental oxygen.  He does not use oxygen at home.  Patient administered albuterol by inhaler and Solu-Medrol on arrival.  He has not had any significant improvement.  Discussed admission with the patient.  He reports that he has "too much to do" and does not want to be admitted.  I explained to him that his lungs are severely damaged and he will not do well if he leaves the hospital.  He declines admission.  We will therefore await PCR Covid test.  If negative he can then receive continuous nebulizer treatments and see if he can be weaned off of oxygen.  Will need to sign out to oncoming ER physician to follow-up response and reevaluate patient.  Patient told he will need to sign out AGAINST MEDICAL ADVICE if he leaves the hospital. Final Clinical Impression(s) / ED Diagnoses Final diagnoses:  COPD exacerbation [facility])    Rx / DC Orders ED Discharge Orders    None       Gilda Crease, MD 06/19/19 [phone]

## 2019-06-19 NOTE — ED Notes (Signed)
ED TO INPATIENT HANDOFF REPORT  ED Nurse Name and Phone #:   S Name/Age/Gender David Rhodes 63 y.o. male Room/Bed: 018C/018C  Code Status   Code Status: Full Code  Home/SNF/Other Home Patient oriented to: self Is this baseline? Yes   Triage Complete: Triage complete  Chief Complaint Acute respiratory failure with hypoxia ([place]) [J96.01]  Triage Note The pt is c/o difficulty breathing for one week hx asthma and copd   Audible wheezes much respiratory difficulty    Allergies Allergies  Allergen Reactions  . Penicillins Shortness Of Breath    PATIENT HAS HAD A PCN REACTION WITH IMMEDIATE RASH, FACIAL/TONGUE/THROAT SWELLING, SOB, OR LIGHTHEADEDNESS WITH HYPOTENSION:  #  #  YES  #  #  Has patient had a PCN reaction causing severe rash involving mucus membranes or skin necrosis: No Has patient had a PCN reaction that required hospitalization No Has patient had a PCN reaction occurring within the last 10 years: No If all of the above answers are "NO", then may proceed with Cephalosporin use. **tolerated cefazolin and cephalexin 03/2017    Level of Care/Admitting Diagnosis ED Disposition    ED Disposition Condition [facility] Area: [place] [[id number]]  Level of Care: Med-Surg [16]  Covid Evaluation: Confirmed COVID Negative  Diagnosis: Acute respiratory failure with hypoxia [facility]) [[id number]]  Admitting Physician: Oda Kilts [[id number]]  Attending Physician: Oda Kilts [[id number]]  PT Class (Do Not Modify): Observation [104]  PT Acc Code (Do Not Modify): Observation [10022]       B Medical/Surgery History Past Medical History:  Diagnosis Date  . Allergy    Hay fever  . Anxiety   . Asthma   . Chronic pain due to trauma   . COPD (chronic obstructive pulmonary disease) ([place])   . Depression   . Essential tremor   . GERD (gastroesophageal reflux disease)   . Headache(784.0)    neck pain  . Hypertension   . Infection  of chest IPG pocket ([place]) 04/06/2018  . Inguinal hernia    bilateral  . Pneumonia    many years ago  . Shortness of breath    when allergies flare up  . Urine incontinence   . Wears glasses    Past Surgical History:  Procedure Laterality Date  . DEEP BRAIN STIMULATOR PLACEMENT    . INGUINAL HERNIA REPAIR Bilateral 02/07/2014   Procedure:  BILATERAL INGUINAL HERNIA REPAIR;  Surgeon: Ralene Ok, MD;  Location: [place];  Service: General;  Laterality: Bilateral;  . INSERTION OF MESH Bilateral 02/07/2014   Procedure: INSERTION OF MESH;  Surgeon: Ralene Ok, MD;  Location: [place];  Service: General;  Laterality: Bilateral;  . NASAL SINUS SURGERY    . PULSE GENERATOR IMPLANT Left 09/11/2016   Procedure: Left chest-Change implantable pulse generator battery;  Surgeon: Erline Levine, MD;  Location: [place];  Service: Neurosurgery;  Laterality: Left;  Left chest-Change implantable pulse generator battery  . PULSE GENERATOR IMPLANT Left 04/06/2018   Procedure: Revision of left chest implantable pulse generator, extension, and pocket adapter;  Surgeon: Erline Levine, MD;  Location: [place];  Service: Neurosurgery;  Laterality: Left;  . SUBTHALAMIC STIMULATOR BATTERY REPLACEMENT  12/09/2011   Procedure: SUBTHALAMIC STIMULATOR BATTERY REPLACEMENT;  Surgeon: Erline Levine, MD;  Location: [place] [facility];  Service: Neurosurgery;  Laterality: N/A;   deep brain stimulator, implantable pulse generator change  . SUBTHALAMIC STIMULATOR BATTERY REPLACEMENT Left 04/21/2014   Procedure: Deep brain stimulator battery  change;  Surgeon: Erline Levine, MD;  Location: [place] [facility];  Service: Neurosurgery;  Laterality: Left;  Deep brain stimulator battery change  . SUBTHALAMIC STIMULATOR BATTERY REPLACEMENT N/A 08/10/2018   Procedure: Replacement of deep brain stimulator pulse generator/left abdomen with long lead extensions;  Surgeon: Erline Levine, MD;  Location: [place];  Service: Neurosurgery;  Laterality: N/A;     A IV  Location/Drains/Wounds Patient Lines/Drains/Airways Status   Active Line/Drains/Airways    Name:   Placement date:   Placement time:   Site:   Days:   Peripheral IV 06/19/19 Left Antecubital   06/19/19    0145    Antecubital   less than 1          Intake/Output Last 24 hours No intake or output data in the 24 hours ending 06/19/19 1506  Labs/Imaging Results for orders placed or performed during the hospital encounter of 06/19/19 (from the past 48 hour(s))  Basic metabolic panel     Status: Abnormal   Collection Time: 06/19/19  1:46 AM  Result Value Ref Range   Sodium 140 135 - 145 mmol/L   Potassium 3.9 3.5 - 5.1 mmol/L   Chloride 105 98 - 111 mmol/L   CO2 27 22 - 32 mmol/L   Glucose, Bld 116 (H) 70 - 99 mg/dL   BUN 8 8 - 23 mg/dL   Creatinine, Ser 1.05 0.61 - 1.24 mg/dL   Calcium 10.4 (H) 8.9 - 10.3 mg/dL   GFR calc non Af Amer >60 >60 mL/min   GFR calc Af Amer >60 >60 mL/min   Anion gap 8 5 - 15    Comment: Performed at [facility], [place] [address], [place], [place] [address]  CBC     Status: Abnormal   Collection Time: 06/19/19  1:46 AM  Result Value Ref Range   WBC 13.1 (H) 4.0 - 10.5 K/uL   RBC 5.37 4.22 - 5.81 MIL/uL   Hemoglobin 15.7 13.0 - 17.0 g/dL   HCT 47.7 39.0 - 52.0 %   MCV 88.8 80.0 - 100.0 fL   MCH 29.2 26.0 - 34.0 pg   MCHC 32.9 30.0 - 36.0 g/dL   RDW 13.5 11.5 - 15.5 %   Platelets 244 150 - 400 K/uL   nRBC 0.0 0.0 - 0.2 %    Comment: Performed at [facility], [place] [address], [place], [place] [address]  Troponin I (High Sensitivity)     Status: None   Collection Time: 06/19/19  1:46 AM  Result Value Ref Range   Troponin I (High Sensitivity) 3 <18 ng/L    Comment: (NOTE) Elevated high sensitivity troponin I (hsTnI) values and significant  changes across serial measurements may suggest ACS but many other  chronic and acute conditions are known to elevate hsTnI results.  Refer to the "Links" section for chest pain algorithms and  additional  guidance. Performed at [facility], [place] [address], [place], [place] [address]   Troponin I (High Sensitivity)     Status: None   Collection Time: 06/19/19  3:24 AM  Result Value Ref Range   Troponin I (High Sensitivity) <2 <18 ng/L    Comment: Performed at [place] [address], [place], [place] [address]  POC SARS Coronavirus 2 Ag-ED - Nasal Swab (BD Veritor Kit)     Status: None   Collection Time: 06/19/19  3:51 AM  Result Value Ref Range   SARS Coronavirus 2 Ag  NEGATIVE NEGATIVE    Comment: (NOTE) SARS-CoV-2 antigen NOT DETECTED.  Negative results are presumptive.  Negative results do not preclude SARS-CoV-2 infection and should not be used as the sole basis for treatment or other patient management decisions, including infection  control decisions, particularly in the presence of clinical signs and  symptoms consistent with COVID-19, or in those who have been in contact with the virus.  Negative results must be combined with clinical observations, patient history, and epidemiological information. The expected result is Negative. Fact Sheet for Patients: PodPark.tn Fact Sheet for Healthcare Providers: GiftContent.is This test is not yet approved or cleared by the [place] FDA and  has been authorized for detection and/or diagnosis of SARS-CoV-2 by FDA under an Emergency Use Authorization (EUA).  This EUA will remain in effect (meaning this test can be used) for the duration of  the COVID-19 de claration under Section 564(b)(1) of the Act, 21 U.S.C. section 360bbb-3(b)(1), unless the authorization is terminated or revoked sooner.   Respiratory Panel by RT PCR (Flu A&B, Covid) - Nasopharyngeal Swab     Status: None   Collection Time: 06/19/19  4:55 AM   Specimen: Nasopharyngeal Swab  Result Value Ref Range   SARS Coronavirus 2 by RT PCR NEGATIVE NEGATIVE    Comment:  (NOTE) SARS-CoV-2 target nucleic acids are NOT DETECTED. The SARS-CoV-2 RNA is generally detectable in upper respiratoy specimens during the acute phase of infection. The lowest concentration of SARS-CoV-2 viral copies this assay can detect is 131 copies/mL. A negative result does not preclude SARS-Cov-2 infection and should not be used as the sole basis for treatment or other patient management decisions. A negative result may occur with  improper specimen collection/handling, submission of specimen other than nasopharyngeal swab, presence of viral mutation(s) within the areas targeted by this assay, and inadequate number of viral copies (<131 copies/mL). A negative result must be combined with clinical observations, patient history, and epidemiological information. The expected result is Negative. Fact Sheet for Patients:  PinkCheek.be Fact Sheet for Healthcare Providers:  GravelBags.it This test is not yet ap proved or cleared by the [place] FDA and  has been authorized for detection and/or diagnosis of SARS-CoV-2 by FDA under an Emergency Use Authorization (EUA). This EUA will remain  in effect (meaning this test can be used) for the duration of the COVID-19 declaration under Section 564(b)(1) of the Act, 21 U.S.C. section 360bbb-3(b)(1), unless the authorization is terminated or revoked sooner.    Influenza A by PCR NEGATIVE NEGATIVE   Influenza B by PCR NEGATIVE NEGATIVE    Comment: (NOTE) The Xpert Xpress SARS-CoV-2/FLU/RSV assay is intended as an aid in  the diagnosis of influenza from Nasopharyngeal swab specimens and  should not be used as a sole basis for treatment. Nasal washings and  aspirates are unacceptable for Xpert Xpress SARS-CoV-2/FLU/RSV  testing. Fact Sheet for Patients: PinkCheek.be Fact Sheet for Healthcare Providers: GravelBags.it This  test is not yet approved or cleared by the [place] FDA and  has been authorized for detection and/or diagnosis of SARS-CoV-2 by  FDA under an Emergency Use Authorization (EUA). This EUA will remain  in effect (meaning this test can be used) for the duration of the  Covid-19 declaration under Section 564(b)(1) of the Act, 21  U.S.C. section 360bbb-3(b)(1), unless the authorization is  terminated or revoked. Performed at [facility], [place] [address] [place], [place] [address]    DG Chest Portable 1 View  Result Date:  06/19/2019 CLINICAL DATA:  COPD and asthma. EXAM: PORTABLE CHEST 1 VIEW COMPARISON:  May 19, 2019 FINDINGS: The lungs are hyperexpanded. There is no focal infiltrate. No pneumothorax. No large pleural effusion. The heart size is stable from prior study. There is no acute osseous abnormality detected on this exam. IMPRESSION: Stable chest x-ray. No acute cardiopulmonary findings. Electronically Signed   By: Constance Holster M.D.   On: 06/19/2019 01:46    Pending Labs Unresulted Labs (From admission, onward)    Start     Ordered   06/20/19 0500  Comprehensive metabolic panel  Tomorrow morning,   R     06/19/19 1307   06/20/19 0500  CBC WITH DIFFERENTIAL  Tomorrow morning,   R     06/19/19 1307   06/19/19 1442  Respiratory Panel by PCR  (Respiratory virus panel with precautions)  Once,   STAT     06/19/19 1442          Vitals/Pain Today's Vitals   06/19/19 0835 06/19/19 1215 06/19/19 1300 06/19/19 1405  BP:    117/81  Pulse:    (!) 125  Resp:    (!) 22  Temp:      TempSrc:      SpO2: 95%   94%  Weight:   70.8 kg   Height:   6' (1.829 m)   PainSc:  0-No pain  6     Isolation Precautions Droplet precaution  Medications Medications  albuterol (VENTOLIN HFA) 108 (90 Base) MCG/ACT inhaler 8 puff (8 puffs Inhalation Given 06/19/19 0910)  pantoprazole (PROTONIX) EC tablet 40 mg (has no administration in time range)  baclofen (LIORESAL) tablet  10 mg (has no administration in time range)  fluticasone furoate-vilanterol (BREO ELLIPTA) 100-25 MCG/INH 1 puff (has no administration in time range)  rOPINIRole (REQUIP) tablet 2 mg (2 mg Oral Given 06/19/19 1404)  gabapentin (NEURONTIN) capsule 600 mg (has no administration in time range)  montelukast (SINGULAIR) tablet 10 mg (has no administration in time range)  enoxaparin (LOVENOX) injection 40 mg (40 mg Subcutaneous Given 06/19/19 1404)  acetaminophen (TYLENOL) tablet 650 mg (has no administration in time range)    Or  acetaminophen (TYLENOL) suppository 650 mg (has no administration in time range)  ipratropium-albuterol (DUONEB) 0.5-2.5 (3) MG/3ML nebulizer solution 3 mL (has no administration in time range)  azithromycin (ZITHROMAX) tablet 500 mg (500 mg Oral Given 06/19/19 1403)    Followed by  azithromycin (ZITHROMAX) tablet 250 mg (has no administration in time range)  predniSONE (DELTASONE) tablet 40 mg (has no administration in time range)  umeclidinium bromide (INCRUSE ELLIPTA) 62.5 MCG/INH 1 puff (has no administration in time range)  sodium chloride flush (NS) 0.9 % injection 3 mL (3 mLs Intravenous Given 06/19/19 0451)  methylPREDNISolone sodium succinate (SOLU-MEDROL) 125 mg/2 mL injection 125 mg (125 mg Intravenous Given 06/19/19 0451)  albuterol (PROVENTIL,VENTOLIN) solution continuous neb (10 mg/hr Nebulization Given 06/19/19 0834)    Mobility walks     Focused Assessments   R Recommendations: See Admitting Provider Note  Report given to:   Additional Notes:

## 2019-06-19 NOTE — Evaluation (Signed)
SATURATION QUALIFICATIONS: (This note is used to comply with regulatory documentation for home oxygen)  Patient Saturations on Room Air at Rest = 98%  Patient Saturations on Room Air while Ambulating = 84%  Patient Saturations on 3 Liters of oxygen while Ambulating = 91%  Please briefly explain why patient needs home oxygen: Patient ambulated 45ft - to door in room and back without O2 on and desaturated from 97%-84%. His sat dropped further upon sitting down on the bed to 81% before Oxygen could be put back on. The patient was markedly short of breath immediately upon exertion and activity triggered his cough as well.

## 2019-06-19 NOTE — Progress Notes (Addendum)
PT Cancellation Note  Patient Details Name: David Rhodes MRN: [id number] DOB: 1955-12-30   Cancelled Treatment:    Reason Eval/Treat Not Completed: Other (comment) (pt off floor).  Ellamae Sia, PT, DPT Acute Rehabilitation Services Pager [phone] Office [phone]    Willy Eddy 06/19/2019, 3:56 PM

## 2019-06-19 NOTE — ED Notes (Signed)
Unable to obtain EKG, tried two machines and two sets of chords. Would not pick up left leg or V3-V6.

## 2019-06-19 NOTE — ED Triage Notes (Signed)
The pt is c/o difficulty breathing for one week hx asthma and copd   Audible wheezes much respiratory difficulty

## 2019-06-19 NOTE — ED Notes (Signed)
Expiratory  wheezinf heard in upper lobes able to talk in complete sentences.

## 2019-06-20 DIAGNOSIS — Z7951 Long term (current) use of inhaled steroids: Secondary | ICD-10-CM

## 2019-06-20 DIAGNOSIS — R451 Restlessness and agitation: Secondary | ICD-10-CM | POA: Diagnosis not present

## 2019-06-20 DIAGNOSIS — J9621 Acute and chronic respiratory failure with hypoxia: Secondary | ICD-10-CM | POA: Diagnosis not present

## 2019-06-20 DIAGNOSIS — R456 Violent behavior: Secondary | ICD-10-CM

## 2019-06-20 DIAGNOSIS — G250 Essential tremor: Secondary | ICD-10-CM

## 2019-06-20 DIAGNOSIS — J441 Chronic obstructive pulmonary disease with (acute) exacerbation: Secondary | ICD-10-CM | POA: Diagnosis not present

## 2019-06-20 DIAGNOSIS — Z9682 Presence of neurostimulator: Secondary | ICD-10-CM

## 2019-06-20 DIAGNOSIS — M25512 Pain in left shoulder: Secondary | ICD-10-CM | POA: Diagnosis not present

## 2019-06-20 DIAGNOSIS — R918 Other nonspecific abnormal finding of lung field: Secondary | ICD-10-CM | POA: Diagnosis not present

## 2019-06-20 DIAGNOSIS — I10 Essential (primary) hypertension: Secondary | ICD-10-CM | POA: Diagnosis not present

## 2019-06-20 DIAGNOSIS — G2581 Restless legs syndrome: Secondary | ICD-10-CM

## 2019-06-20 DIAGNOSIS — Z7722 Contact with and (suspected) exposure to environmental tobacco smoke (acute) (chronic): Secondary | ICD-10-CM

## 2019-06-20 DIAGNOSIS — Z79899 Other long term (current) drug therapy: Secondary | ICD-10-CM

## 2019-06-20 DIAGNOSIS — Z880 Allergy status to penicillin: Secondary | ICD-10-CM

## 2019-06-20 DIAGNOSIS — J449 Chronic obstructive pulmonary disease, unspecified: Secondary | ICD-10-CM | POA: Diagnosis not present

## 2019-06-20 LAB — CBC WITH DIFFERENTIAL/PLATELET
Abs Immature Granulocytes: 0.14 10*3/uL — ABNORMAL HIGH (ref 0.00–0.07)
Basophils Absolute: 0.1 10*3/uL (ref 0.0–0.1)
Basophils Relative: 0 %
Eosinophils Absolute: 0 10*3/uL (ref 0.0–0.5)
Eosinophils Relative: 0 %
HCT: 43.3 % (ref 39.0–52.0)
Hemoglobin: 14.5 g/dL (ref 13.0–17.0)
Immature Granulocytes: 1 %
Lymphocytes Relative: 11 %
Lymphs Abs: 2.2 10*3/uL (ref 0.7–4.0)
MCH: 29.7 pg (ref 26.0–34.0)
MCHC: 33.5 g/dL (ref 30.0–36.0)
MCV: 88.7 fL (ref 80.0–100.0)
Monocytes Absolute: 1.6 10*3/uL — ABNORMAL HIGH (ref 0.1–1.0)
Monocytes Relative: 8 %
Neutro Abs: 16.1 10*3/uL — ABNORMAL HIGH (ref 1.7–7.7)
Neutrophils Relative %: 80 %
Platelets: 244 10*3/uL (ref 150–400)
RBC: 4.88 MIL/uL (ref 4.22–5.81)
RDW: 13.8 % (ref 11.5–15.5)
WBC: 20.1 10*3/uL — ABNORMAL HIGH (ref 4.0–10.5)
nRBC: 0 % (ref 0.0–0.2)

## 2019-06-20 LAB — COMPREHENSIVE METABOLIC PANEL
ALT: 14 U/L (ref 0–44)
AST: 14 U/L — ABNORMAL LOW (ref 15–41)
Albumin: 3.6 g/dL (ref 3.5–5.0)
Alkaline Phosphatase: 73 U/L (ref 38–126)
Anion gap: 11 (ref 5–15)
BUN: 19 mg/dL (ref 8–23)
CO2: 26 mmol/L (ref 22–32)
Calcium: 10.9 mg/dL — ABNORMAL HIGH (ref 8.9–10.3)
Chloride: 102 mmol/L (ref 98–111)
Creatinine, Ser: 1.48 mg/dL — ABNORMAL HIGH (ref 0.61–1.24)
GFR calc Af Amer: 58 mL/min — ABNORMAL LOW (ref >60)
GFR calc non Af Amer: 50 mL/min — ABNORMAL LOW (ref >60)
Glucose, Bld: 115 mg/dL — ABNORMAL HIGH (ref 70–99)
Potassium: 4.5 mmol/L (ref 3.5–5.1)
Sodium: 139 mmol/L (ref 135–145)
Total Bilirubin: 0.5 mg/dL (ref 0.3–1.2)
Total Protein: 5.9 g/dL — ABNORMAL LOW (ref 6.5–8.1)

## 2019-06-20 MED ORDER — PREDNISONE 20 MG PO TABS: 40.0000 mg | ORAL_TABLET | Freq: Every day | ORAL | 0 refills | Status: AC

## 2019-06-20 MED ORDER — AZITHROMYCIN 250 MG PO TABS: 250.0000 mg | ORAL_TABLET | Freq: Every day | ORAL | 0 refills | Status: AC

## 2019-06-20 NOTE — TOC Transition Note (Signed)
Transition of Care [facility]) - CM/SW Discharge Note   Patient Details  Name: URAL ACREE MRN: [id number] Date of Birth: 14-Apr-1956  Transition of Care [facility]) CM/SW Contact:  Sharin Mons, RN Phone Number: 06/20/2019, 12:45 PM   Clinical Narrative:    Admitted with acute respiratory failure. Pt to transition to home with home oxygen. Referral made with [employer] [facility] . Oxygen will be provided to pt @ bedside prior to d/c. Pt with transportation to home.    Barriers to Discharge: Continued Medical Work up   Patient Goals and CMS Choice     Choice offered to / list presented to : Patient  Discharge Placement                       Discharge Plan and Services   Discharge Planning Services: CM Consult            DME Arranged: Oxygen DME Agency: [employer] Date DME Agency Contacted: 06/20/19 Time DME Agency Contacted: 1003 Representative spoke with at DME Agency: [place] ([place]) Interventions     Readmission Risk Interventions No flowsheet data found.

## 2019-06-20 NOTE — Progress Notes (Signed)
SATURATION QUALIFICATIONS: (This note is used to comply with regulatory documentation for home oxygen)  Patient Saturations on Room Air at Rest = 97%  Patient Saturations on Room Air while Ambulating = 88%  Patient Saturations on 2 Liters of oxygen while Ambulating = 93%

## 2019-06-20 NOTE — Progress Notes (Signed)
2200 Pt refused meds at this time saying that he was not willing to take the hospitals medication when he just paid for his own medications and they are sitting on the table. Pt was told that personal medication must be turned in to the pharmacy and they will check them in with the nurse and verify and document the amount with both signatures. Mr Radziewicz said that he was not about to let his medication go anywhere unless he went with it to verify that all of his medication stayed together otherwise he was leaving right now. CN was notified at this time.  2220 CN in to talk with pt and to explain the personal medication policy and the safety issues regarding pt medication at the bedside, pt was not satisfied and continued to threaten to leave.Physician on call was paged at this time.  2230 Physician on call returned call and situation was explained in which physician said they would come and talk to pt.  2315 The physicians were able to get the pt to agree to stay to be able to get his home oxygen delivered, and the pt relented to have his medication stored in the floor medication room. He refused all medication saying he hopes he will be ok for the night. Reassured pt that if he changed his mind, medication was available. Call bell and personal belongings within reach, will cont to monitor.

## 2019-06-20 NOTE — Progress Notes (Signed)
PT Cancellation Note  Patient Details Name: David Rhodes MRN: [id number] DOB: 12-14-1955   Cancelled Treatment:    Reason Eval/Treat Not Completed: Other (comment).  Discharged from PT per nsg with O2 ordered for discharge and has walked with nursing with S, therefore no PT needed.  Please reorder if anything is changed.   Ramond Dial 06/20/2019, 11:59 AM   Mee Hives, PT MS Acute Rehab Dept. Number: [place] [facility] [place]
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
## 2019-06-20 NOTE — Progress Notes (Signed)
   Subjective: David Rhodes was seen and evaluated at bedside on morning rounds. He is feeling ok, notes that the mornings are usually worse for him in terms of his breathing. Currently receiving breathing treatment which seems to be helping.  Discussed plan to get home oxygen ordered for him prior to going home. All other questions and concerns addressed.   Objective:  Vital signs in last 24 hours: Vitals:   06/19/19 1817 06/19/19 1958 06/19/19 2227 06/20/19 0415  BP: 109/66  117/72   Pulse: (!) 111  (!) 105   Resp: 19  19   Temp: 98.4 F (36.9 C)  98.8 F (37.1 C)   TempSrc: Oral  Oral   SpO2:  100% 100% 99%  Weight:      Height:       General: awake, alert, sitting up in bed in NAD CV: Tachycardic, rhythm nl. No murmurs, rubs, or gallops. Pulm: Coughing during deep breathing, end expiratory wheezing, no rales or rhonchi Ext: warm , dry  Assessment/Plan:  Active Problems:   Acute respiratory failure with hypoxia (HCC)  David Rhodes is a 63 year old male with past medical history of HTN, RLS, asthma-COPD overlap syndrome, and lung nodules who present with SOB.   #Acute hypoxic respiratory failure #ACOS - RVP negative - given significant symptoms, will continue 5 day course to treat COPD exacerbation  - patient meets home oxygen criteria; orders placed in anticipation of discharge later today  - continue home meds and duonebs q 6 while here   #HTN - holding home lisinopril, reported stopped due to history of cough  #RLS -Continue home ropinirole and gabapentin  #Essential Tremor  Has deep brain stimulator   Dispo: Anticipated discharge today.  Tamsen Snider, MD PGY1  [phone]
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
## 2019-06-29 DIAGNOSIS — J9621 Acute and chronic respiratory failure with hypoxia: Secondary | ICD-10-CM | POA: Diagnosis not present

## 2019-06-29 DIAGNOSIS — J441 Chronic obstructive pulmonary disease with (acute) exacerbation: Secondary | ICD-10-CM | POA: Diagnosis not present

## 2019-06-29 DIAGNOSIS — M25512 Pain in left shoulder: Secondary | ICD-10-CM | POA: Diagnosis not present

## 2019-06-29 DIAGNOSIS — R918 Other nonspecific abnormal finding of lung field: Secondary | ICD-10-CM | POA: Diagnosis not present

## 2019-06-29 DIAGNOSIS — J449 Chronic obstructive pulmonary disease, unspecified: Secondary | ICD-10-CM | POA: Diagnosis not present

## 2019-06-30 ENCOUNTER — Encounter: Payer: Self-pay | Admitting: Pulmonary Disease

## 2019-06-30 ENCOUNTER — Telehealth: Payer: Self-pay | Admitting: Pulmonary Disease

## 2019-06-30 ENCOUNTER — Ambulatory Visit: Payer: Medicare HMO | Admitting: Pulmonary Disease

## 2019-06-30 DIAGNOSIS — R0602 Shortness of breath: Secondary | ICD-10-CM

## 2019-06-30 MED ORDER — TRELEGY ELLIPTA 200-62.5-25 MCG/INH IN AEPB: 1.0000 | INHALATION_SPRAY | Freq: Every day | RESPIRATORY_TRACT | 1 refills | Status: DC

## 2019-06-30 MED ORDER — PREDNISONE 10 MG (21) PO TBPK: ORAL_TABLET | ORAL | 0 refills | Status: DC

## 2019-06-30 NOTE — Telephone Encounter (Signed)
Spoke to pt and offered phone or virtual visit.  Pt was agreeable with phone visit.

## 2019-06-30 NOTE — Progress Notes (Signed)
 Assessment & Plan:  There are no diagnoses linked to this encounter.  Patient Instructions  1.  You have not been using your maintenance inhalers as prescribed.  I have reviewed your pharmacy refill history.  To simplify things for you I will change your inhalers that you take every day to just 1 inhaler called Trelegy Ellipta .  This will be just 1 puff daily you were provided with 1 prescription and 1 refill  2.  DISCONTINUE: Breo Ellipta  and Spiriva   3.  A prednisone  pack was sent to your pharmacy  4.  For reasons that we discussed during our visit we will no longer be able to continue providing you with care and you will need to establish with a different lung specialist.  A letter outlining all of this will be sent to you.  pred Albuterol  3 boxes 12/22 April 4 last spiriva  breo dec4, July 5  Please note: late entry documentation due to logistical difficulties during COVID-19 pandemic. This note is filed for information purposes only, and is not intended to be used for billing, nor does it represent the full scope/nature of the visit in question. Please see any associated scanned media linked to date of encounter for additional pertinent information.  Subjective:    HPI: David Rhodes is a 63 y.o. male presenting to the pulmonology clinic on 06/30/2019 with report of: No chief complaint on file.   Virtual Visit Via Video or Telephone Note:   This visit type was conducted due to national recommendations for restrictions regarding the COVID-19 pandemic .  This format is felt to be most appropriate for this patient at this time.  All issues noted in this document were discussed and addressed.  No physical exam was performed (except for noted visual exam findings with Video Visits).    I connected with XXX  by a video enabled telemedicine application or telephone at 10:45 AM and verified that I was speaking with the correct person using two identifiers. Location patient:  home Location provider:  [facility] Persons participating in the virtual visit: patient, physician   I discussed the limitations, risks, security and privacy concerns of performing an evaluation and management service by video and the availability of in person appointments. The patient expressed understanding and agreed to proceed.  Outpatient Encounter Medications as of 06/30/2019  Medication Sig   loratadine  (CLARITIN ) 10 MG tablet Take 1 tablet (10 mg total) by mouth daily.   [DISCONTINUED] albuterol  (VENTOLIN  HFA) 108 (90 Base) MCG/ACT inhaler Inhale 2 puffs into the lungs every 6 (six) hours as needed for wheezing or shortness of breath.   [DISCONTINUED] baclofen  (LIORESAL ) 10 MG tablet TAKE 1 TABLET(10 MG) BY MOUTH THREE TIMES DAILY (Patient taking differently: Take 10 mg by mouth 3 (three) times daily. )   [DISCONTINUED] BREO ELLIPTA  100-25 MCG/INH AEPB INHALE 1 PUFF INTO THE LUNGS DAILY (Patient taking differently: Inhale 1 puff into the lungs daily. )   [DISCONTINUED] Fluticasone -Umeclidin-Vilant (TRELEGY ELLIPTA ) 200-62.5-25 MCG/INH AEPB Inhale 1 puff into the lungs daily. Rinse mouth well after use   [DISCONTINUED] gabapentin  (NEURONTIN ) 600 MG tablet TAKE 1 TABLET(600 MG) BY MOUTH FOUR TIMES DAILY (Patient taking differently: Take 600 mg by mouth 4 (four) times daily. )   [DISCONTINUED] HYDROcodone -acetaminophen  (NORCO/VICODIN) 5-325 MG tablet Take 1-2 tablets by mouth every 4 (four) hours as needed for severe pain ((score 7 to 10)).   [DISCONTINUED] meloxicam  (MOBIC ) 15 MG tablet TAKE 1 TABLET(15 MG) BY MOUTH DAILY (Patient taking differently:  Take 15 mg by mouth daily.)   [DISCONTINUED] montelukast  (SINGULAIR ) 10 MG tablet TAKE 1 TABLET(10 MG) BY MOUTH AT BEDTIME (Patient taking differently: Take 10 mg by mouth at bedtime.)   [DISCONTINUED] omeprazole  (PRILOSEC) 20 MG capsule TAKE 1 CAPSULE BY MOUTH DAILY (Patient taking differently: Take 20 mg by mouth daily.)    [DISCONTINUED] predniSONE  (STERAPRED UNI-PAK 21 TAB) 10 MG (21) TBPK tablet Take as directed in package   [DISCONTINUED] rOPINIRole  (REQUIP ) 2 MG tablet TAKE 1 TABLET BY MOUTH TWICE DAILY (Patient taking differently: Take 2 mg by mouth 2 (two) times daily. )   [DISCONTINUED] SPIRIVA  HANDIHALER 18 MCG inhalation capsule INHALE CONTENTS OF 1 CAPSULE ONCE DAILY USING HANDIHALER (Patient taking differently: Place 18 mcg into inhaler and inhale daily. )   No facility-administered encounter medications on file as of 06/30/2019.      Objective:   There were no vitals filed for this visit.   Physical exam documentation is limited by delayed entry of information.
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
## 2019-06-30 NOTE — Telephone Encounter (Addendum)
Will leave encounter open until dismissal letter is written.

## 2019-06-30 NOTE — Patient Instructions (Addendum)
1.  You have not been using your maintenance inhalers as prescribed.  I have reviewed your pharmacy refill history.  To simplify things for you I will change your inhalers that you take every day to just 1 inhaler called Trelegy Ellipta.  This will be just 1 puff daily you were provided with 1 prescription and 1 refill  2.  DISCONTINUE: Breo Ellipta and Spiriva  3.  A prednisone pack was sent to your pharmacy  4.  For reasons that we discussed during our visit we will no longer be able to continue providing you with care and you will need to establish with a different lung specialist.  A letter outlining all of this will be sent to you.

## 2019-07-11 NOTE — Discharge Summary (Addendum)
Name: David Rhodes MRN: [id number] DOB: 11/16/1955 64 y.o. PCP: Glean Hess, MD  Date of Admission: 06/19/2019  1:17 AM Date of Discharge: 06/20/2019 Attending Physician: Oda Kilts  Discharge Diagnosis: 1. Acute on Chronic Hypoxic respiratory failure  Discharge Medications: Allergies as of 06/20/2019       Reactions   Penicillins Shortness Of Breath   PATIENT HAS HAD A PCN REACTION WITH IMMEDIATE RASH, FACIAL/TONGUE/THROAT SWELLING, SOB, OR LIGHTHEADEDNESS WITH HYPOTENSION:  #  #  YES  #  #  Has patient had a PCN reaction causing severe rash involving mucus membranes or skin necrosis: No Has patient had a PCN reaction that required hospitalization No Has patient had a PCN reaction occurring within the last 10 years: No If all of the above answers are "NO", then may proceed with Cephalosporin use. **tolerated cefazolin and cephalexin 03/2017        Medication List     STOP taking these medications    lisinopril 40 MG tablet Commonly known as: ZESTRIL       TAKE these medications    albuterol 108 (90 Base) MCG/ACT inhaler Commonly known as: VENTOLIN HFA Inhale 2 puffs into the lungs every 6 (six) hours as needed for wheezing or shortness of breath.   baclofen 10 MG tablet Commonly known as: LIORESAL TAKE 1 TABLET(10 MG) BY MOUTH THREE TIMES DAILY What changed: See the new instructions.   gabapentin 600 MG tablet Commonly known as: NEURONTIN TAKE 1 TABLET(600 MG) BY MOUTH FOUR TIMES DAILY What changed: See the new instructions.   HYDROcodone-acetaminophen 5-325 MG tablet Commonly known as: NORCO/VICODIN Take 1-2 tablets by mouth every 4 (four) hours as needed for severe pain ((score 7 to 10)).   loratadine 10 MG tablet Commonly known as: CLARITIN Take 1 tablet (10 mg total) by mouth daily.   meloxicam 15 MG tablet Commonly known as: MOBIC TAKE 1 TABLET(15 MG) BY MOUTH DAILY What changed: See the new instructions.   montelukast 10 MG  tablet Commonly known as: SINGULAIR TAKE 1 TABLET(10 MG) BY MOUTH AT BEDTIME What changed: See the new instructions.   omeprazole 20 MG capsule Commonly known as: PRILOSEC TAKE 1 CAPSULE BY MOUTH DAILY   rOPINIRole 2 MG tablet Commonly known as: REQUIP TAKE 1 TABLET BY MOUTH TWICE DAILY       ASK your doctor about these medications    azithromycin 250 MG tablet Commonly known as: ZITHROMAX Take 1 tablet (250 mg total) by mouth daily for 4 days. Ask about: Should I take this medication?   predniSONE 20 MG tablet Commonly known as: DELTASONE Take 2 tablets (40 mg total) by mouth daily with breakfast for 4 days. Ask about: Should I take this medication?        Disposition and follow-up:   David Rhodes was discharged from [facility] in Stable condition.  At the hospital follow up visit please address:  1.  Asthma-COPD overlap syndrome  - referral to his pulmonologist for further management, now requiring supplemental oxygen    2.  Labs / imaging needed at time of follow-up: n/a  3.  Pending labs/ test needing follow-up: n/a  Follow-up Appointments: Follow-up Information     Glean Hess, MD. Schedule an appointment as soon as possible for a visit in 2 week(s).   Specialty: Internal Medicine Contact information: [address] [place] [address] [phone]         Tyler Pita, MD.  Schedule an appointment as soon as possible for a visit in 2 week(s).   Specialty: Pulmonary Disease Contact information: [address] [place] [address] [phone]            Hospital Course by problem list: 1. #Acute hypoxic respiratory failure, COPD exacerbation - RVP negative, afebrile, normal white count, no signs of pneumonia on xray - given significant symptoms, treated for COPD exacerbation  - patient meets home oxygen criteria; orders placed  -  home meds and duonebs q 6 hours while  hospitalized  - per his request discharged the morning after admission to complete therapy at home   #HTN - held home lisinopril, reported stopped due to history of cough   #RLS -Continued home ropinirole and gabapentin     Discharge Vitals:   BP 121/89 (BP Location: Right Arm)   Pulse 91   Temp 97.9 F (36.6 C) (Oral)   Resp 20   Ht 6' (1.829 m)   Wt 70.8 kg   SpO2 97%   BMI 21.16 kg/m   Pertinent Labs, Studies, and Procedures:  CBC Latest Ref Rng & Units 06/20/2019 06/19/2019 04/29/2019  WBC 4.0 - 10.5 K/uL 20.1(H) 13.1(H) 8.9  Hemoglobin 13.0 - 17.0 g/dL 09.4 70.9 62.8  Hematocrit 39.0 - 52.0 % 43.3 47.7 45.6  Platelets 150 - 400 K/uL 244 244 315   BMP Latest Ref Rng & Units 06/20/2019 06/19/2019 04/14/2019  Glucose 70 - 99 mg/dL 366(Q) 947(M) 80  BUN 8 - 23 mg/dL 19 8 17   Creatinine 0.61 - 1.24 mg/dL ) 5.46(T 0.35  BUN/Creat Ratio 10 - 24 - - 18  Sodium 135 - 145 mmol/L 139 140 143  Potassium 3.5 - 5.1 mmol/L 4.5 3.9 5.5(H)  Chloride 98 - 111 mmol/L 102 105 105  CO2 22 - 32 mmol/L 26 27 25   Calcium 8.9 - 10.3 mg/dL 10.9(H) 10.4(H) 11.1(H)   PORTABLE CHEST 1 VIEW on 05/2019   COMPARISON:  May 19, 2019   FINDINGS: The lungs are hyperexpanded. There is no focal infiltrate. No pneumothorax. No large pleural effusion. The heart size is stable from prior study. There is no acute osseous abnormality detected on this exam.   IMPRESSION: Stable chest x-ray. No acute cardiopulmonary findings.  Discharge Instructions: Discharge Instructions     Diet - low sodium heart healthy   Complete by: As directed    Increase activity slowly   Complete by: As directed    Increase activity slowly   Complete by: As directed        Signed:  07/2019, MD PGY1  [phone]

## 2019-07-12 ENCOUNTER — Other Ambulatory Visit: Payer: Self-pay

## 2019-07-12 DIAGNOSIS — J439 Emphysema, unspecified: Secondary | ICD-10-CM

## 2019-07-12 DIAGNOSIS — J441 Chronic obstructive pulmonary disease with (acute) exacerbation: Secondary | ICD-10-CM

## 2019-07-15 NOTE — Progress Notes (Deleted)
David Rhodes was seen today in follow up for essential tremor.  My previous records were reviewed prior to todays visit.  Outside records that were made available to me were reviewed.  Pt is a 64 y/o with hx of DBS and noncompliance in regards to f/u seen today for tremor.  Not seen since 10/2017 at which point he wanted to go to [facility].  He never did end up going there.  Pt has had much happened since our last visit.  Many medical records were reviewed.patient has had numerous hospitalizations and emergency room visits.  Patient went to the OR in October with Dr. Venetia Maxon after having an open wound on the chest which had been draining for 4 months.  The IPG was removed.  His IPG was replaced on August 10, 2018.  Patient has been in the emergency room/admitted several times for respiratory distress/COPD as well since our last visit.  Current prescribed movement disorder medications: ***   PREVIOUS MEDICATIONS: {Parkinson's RX:18200}  ALLERGIES:   Allergies  Allergen Reactions  . Penicillins Shortness Of Breath    PATIENT HAS HAD A PCN REACTION WITH IMMEDIATE RASH, FACIAL/TONGUE/THROAT SWELLING, SOB, OR LIGHTHEADEDNESS WITH HYPOTENSION:  #  #  YES  #  #  Has patient had a PCN reaction causing severe rash involving mucus membranes or skin necrosis: No Has patient had a PCN reaction that required hospitalization No Has patient had a PCN reaction occurring within the last 10 years: No If all of the above answers are "NO", then may proceed with Cephalosporin use. **tolerated cefazolin and cephalexin 03/2017    CURRENT MEDICATIONS:  Outpatient Encounter Medications as of 07/18/2019  Medication Sig  . albuterol (VENTOLIN HFA) 108 (90 Base) MCG/ACT inhaler Inhale 2 puffs into the lungs every 6 (six) hours as needed for wheezing or shortness of breath.  . baclofen (LIORESAL) 10 MG tablet TAKE 1 TABLET(10 MG) BY MOUTH THREE TIMES DAILY (Patient taking differently: Take 10 mg by mouth 3 (three)  times daily. )  . Fluticasone-Umeclidin-Vilant (TRELEGY ELLIPTA) 200-62.5-25 MCG/INH AEPB Inhale 1 puff into the lungs daily. Rinse mouth well after use  . gabapentin (NEURONTIN) 600 MG tablet TAKE 1 TABLET(600 MG) BY MOUTH FOUR TIMES DAILY (Patient taking differently: Take 600 mg by mouth 4 (four) times daily. )  . HYDROcodone-acetaminophen (NORCO/VICODIN) 5-325 MG tablet Take 1-2 tablets by mouth every 4 (four) hours as needed for severe pain ((score 7 to 10)).  Marland Kitchen loratadine (CLARITIN) 10 MG tablet Take 1 tablet (10 mg total) by mouth daily.  . meloxicam (MOBIC) 15 MG tablet TAKE 1 TABLET(15 MG) BY MOUTH DAILY (Patient taking differently: Take 15 mg by mouth daily. )  . montelukast (SINGULAIR) 10 MG tablet TAKE 1 TABLET(10 MG) BY MOUTH AT BEDTIME (Patient taking differently: Take 10 mg by mouth at bedtime. )  . omeprazole (PRILOSEC) 20 MG capsule TAKE 1 CAPSULE BY MOUTH DAILY (Patient taking differently: Take 20 mg by mouth daily. )  . predniSONE (STERAPRED UNI-PAK 21 TAB) 10 MG (21) TBPK tablet Take as directed in package  . rOPINIRole (REQUIP) 2 MG tablet TAKE 1 TABLET BY MOUTH TWICE DAILY (Patient taking differently: Take 2 mg by mouth 2 (two) times daily. )   No facility-administered encounter medications on file as of 07/18/2019.    PHYSICAL EXAMINATION:    VITALS:  There were no vitals filed for this visit.  GEN:  The patient appears stated age and is in NAD. HEENT:  Normocephalic, atraumatic.  The mucous membranes are moist. The superficial temporal arteries are without ropiness or tenderness. CV:  RRR Lungs:  CTAB Neck/HEME:  There are no carotid bruits bilaterally.  Neurological examination:  Orientation: The patient is alert and oriented x3. Cranial nerves: There is good facial symmetry. The speech is fluent and clear. Soft palate rises symmetrically and there is no tongue deviation. Hearing is intact to conversational tone. Sensation: Sensation is intact to light touch  throughout Motor: Strength is at least antigravity x4.  Movement examination: Tone: There is normal tone in the UE/LE Abnormal movements: *** Coordination:  There is *** decremation with RAM's, *** Gait and Station: The patient has *** difficulty arising out of a deep-seated chair without the use of the hands. The patient's stride length is good   ASSESSMENT/PLAN:  1.  Essential Tremor  ***Status post VIM DBS in June, 2008 with Dr. Vertell Limber.  IPG changed last in February, 2020.  Patient had to have IPG removed in October, 2020 after draining chest wound (draining for 4 months).  Patient has had speech changes with the left brain leave.  This has been reprogrammed multiple times, but he ultimately always goes back to the original setting.  -Patient with poor compliance and poor health literacy. 2.  Restless leg  -On gabapentin 3.  Hyperreflexia  -Patient with older DBS and cannot be scanned with MRI.  Total time spent on today's visit was ***greater than 30 minutes, including both face-to-face time and nonface-to-face time.  Time included that spent on review of records (prior notes available to me/labs/imaging if pertinent), discussing treatment and goals, answering patient's questions and coordinating care.  Cc:  Glean Hess, MD

## 2019-07-18 ENCOUNTER — Encounter: Payer: Medicare HMO | Admitting: Neurology

## 2019-07-18 ENCOUNTER — Telehealth: Payer: Self-pay | Admitting: Neurology

## 2019-07-18 NOTE — Telephone Encounter (Signed)
Patient left message on Voicemail returning your call. Thank you 

## 2019-07-18 NOTE — Telephone Encounter (Signed)
Left message to call office back

## 2019-07-18 NOTE — Telephone Encounter (Signed)
Per your recommendations is another doctor closer to patient?

## 2019-07-18 NOTE — Telephone Encounter (Signed)
Patient arrived today with Symptoms of COVID. Rescheduled. Patient did ask if there was somewhere closer for him to go near [place] area? Please Call. Thank you

## 2019-07-18 NOTE — Telephone Encounter (Signed)
This patient is very noncompliant with a hx of such.  We have previously referred him to [facility] after he requested the same and he didn't go/didn't answer phone to schedule.  There is no one closer.  He and I have discussed this multiple times in the past.

## 2019-07-19 NOTE — Telephone Encounter (Signed)
Unable to contact patient.

## 2019-07-20 ENCOUNTER — Other Ambulatory Visit: Payer: Self-pay | Admitting: Internal Medicine

## 2019-07-20 DIAGNOSIS — R0689 Other abnormalities of breathing: Secondary | ICD-10-CM | POA: Diagnosis not present

## 2019-07-20 DIAGNOSIS — R069 Unspecified abnormalities of breathing: Secondary | ICD-10-CM | POA: Diagnosis not present

## 2019-07-20 DIAGNOSIS — Z7951 Long term (current) use of inhaled steroids: Secondary | ICD-10-CM | POA: Diagnosis not present

## 2019-07-20 DIAGNOSIS — R000 Tachycardia, unspecified: Secondary | ICD-10-CM | POA: Diagnosis not present

## 2019-07-20 DIAGNOSIS — R0602 Shortness of breath: Secondary | ICD-10-CM | POA: Diagnosis not present

## 2019-07-20 DIAGNOSIS — Z20822 Contact with and (suspected) exposure to covid-19: Secondary | ICD-10-CM | POA: Diagnosis not present

## 2019-07-20 DIAGNOSIS — M5412 Radiculopathy, cervical region: Secondary | ICD-10-CM

## 2019-07-20 DIAGNOSIS — Z743 Need for continuous supervision: Secondary | ICD-10-CM | POA: Diagnosis not present

## 2019-07-20 DIAGNOSIS — R0603 Acute respiratory distress: Secondary | ICD-10-CM | POA: Diagnosis not present

## 2019-07-20 DIAGNOSIS — Z7952 Long term (current) use of systemic steroids: Secondary | ICD-10-CM | POA: Diagnosis not present

## 2019-07-20 DIAGNOSIS — G250 Essential tremor: Secondary | ICD-10-CM | POA: Diagnosis not present

## 2019-07-20 DIAGNOSIS — Z880 Allergy status to penicillin: Secondary | ICD-10-CM | POA: Diagnosis not present

## 2019-07-20 DIAGNOSIS — J441 Chronic obstructive pulmonary disease with (acute) exacerbation: Secondary | ICD-10-CM | POA: Diagnosis not present

## 2019-07-20 DIAGNOSIS — R0600 Dyspnea, unspecified: Secondary | ICD-10-CM | POA: Diagnosis not present

## 2019-07-21 DIAGNOSIS — M25512 Pain in left shoulder: Secondary | ICD-10-CM | POA: Diagnosis not present

## 2019-07-21 DIAGNOSIS — J9602 Acute respiratory failure with hypercapnia: Secondary | ICD-10-CM | POA: Diagnosis not present

## 2019-07-21 DIAGNOSIS — J9621 Acute and chronic respiratory failure with hypoxia: Secondary | ICD-10-CM | POA: Diagnosis not present

## 2019-07-21 DIAGNOSIS — J449 Chronic obstructive pulmonary disease, unspecified: Secondary | ICD-10-CM | POA: Diagnosis not present

## 2019-07-21 DIAGNOSIS — J9601 Acute respiratory failure with hypoxia: Secondary | ICD-10-CM | POA: Diagnosis not present

## 2019-07-21 DIAGNOSIS — J441 Chronic obstructive pulmonary disease with (acute) exacerbation: Secondary | ICD-10-CM | POA: Diagnosis not present

## 2019-07-21 DIAGNOSIS — R918 Other nonspecific abnormal finding of lung field: Secondary | ICD-10-CM | POA: Diagnosis not present

## 2019-07-22 DIAGNOSIS — J441 Chronic obstructive pulmonary disease with (acute) exacerbation: Secondary | ICD-10-CM | POA: Diagnosis not present

## 2019-07-22 MED ORDER — ROPINIROLE HCL 2 MG PO TABS
2.00 | ORAL_TABLET | ORAL | Status: DC
Start: 2019-07-22 — End: 2019-07-22

## 2019-07-22 MED ORDER — PREDNISONE 20 MG PO TABS
40.00 | ORAL_TABLET | ORAL | Status: DC
Start: 2019-07-23 — End: 2019-07-22

## 2019-07-22 MED ORDER — IPRATROPIUM-ALBUTEROL 0.5-2.5 (3) MG/3ML IN SOLN
3.00 | RESPIRATORY_TRACT | Status: DC
Start: 2019-07-22 — End: 2019-07-22

## 2019-07-22 MED ORDER — FLUTICASONE FUROATE-VILANTEROL 100-25 MCG/INH IN AEPB
1.00 | INHALATION_SPRAY | RESPIRATORY_TRACT | Status: DC
Start: 2019-07-23 — End: 2019-07-22

## 2019-07-22 MED ORDER — ALBUTEROL SULFATE (2.5 MG/3ML) 0.083% IN NEBU
2.50 | INHALATION_SOLUTION | RESPIRATORY_TRACT | Status: DC
Start: ? — End: 2019-07-22

## 2019-07-22 MED ORDER — POLYETHYLENE GLYCOL 3350 17 GM/SCOOP PO POWD
17.00 | ORAL | Status: DC
Start: 2019-07-22 — End: 2019-07-22

## 2019-07-22 MED ORDER — GABAPENTIN 300 MG PO CAPS
300.00 | ORAL_CAPSULE | ORAL | Status: DC
Start: 2019-07-22 — End: 2019-07-22

## 2019-07-22 MED ORDER — SENNOSIDES 8.6 MG PO TABS
1.00 | ORAL_TABLET | ORAL | Status: DC
Start: 2019-07-22 — End: 2019-07-22

## 2019-07-25 DIAGNOSIS — T17800S Unspecified foreign body in other parts of respiratory tract causing asphyxiation, sequela: Secondary | ICD-10-CM | POA: Diagnosis not present

## 2019-07-25 DIAGNOSIS — J4520 Mild intermittent asthma, uncomplicated: Secondary | ICD-10-CM | POA: Diagnosis not present

## 2019-07-25 DIAGNOSIS — J4540 Moderate persistent asthma, uncomplicated: Secondary | ICD-10-CM | POA: Diagnosis not present

## 2019-07-25 DIAGNOSIS — R0602 Shortness of breath: Secondary | ICD-10-CM | POA: Diagnosis not present

## 2019-07-25 DIAGNOSIS — J479 Bronchiectasis, uncomplicated: Secondary | ICD-10-CM | POA: Diagnosis not present

## 2019-07-29 NOTE — Progress Notes (Signed)
David Rhodes was seen today in follow up for essential tremor. My previous records were reviewed prior to todays visit.  Outside records that were made available to me were reviewed.  Pt is a 64 y/o with hx of DBS and noncompliance in regards to f/u seen today for tremor.  Not seen since 10/2017 at which point he wanted to go to [facility].  He never did end up going there.  Pt has had much happened since our last visit.  Many medical records were reviewed.patient has had numerous hospitalizations and emergency room visits.  Patient went to the OR in October with Dr. Vertell Limber after having an open wound on the chest which had been draining for 4 months.  The IPG was removed.  His IPG was replaced on August 10, 2018.    Pt states that IPG was replaced into the abdomen.  "I've adjusted it but I need it more on the R hand."  States that he doesn't care if interferes with his speech.      ALLERGIES:   Allergies  Allergen Reactions  . Penicillins Shortness Of Breath    PATIENT HAS HAD A PCN REACTION WITH IMMEDIATE RASH, FACIAL/TONGUE/THROAT SWELLING, SOB, OR LIGHTHEADEDNESS WITH HYPOTENSION:  #  #  YES  #  #  Has patient had a PCN reaction causing severe rash involving mucus membranes or skin necrosis: No Has patient had a PCN reaction that required hospitalization No Has patient had a PCN reaction occurring within the last 10 years: No If all of the above answers are "NO", then may proceed with Cephalosporin use. **tolerated cefazolin and cephalexin 03/2017    CURRENT MEDICATIONS:  Outpatient Encounter Medications as of 08/02/2019  Medication Sig  . albuterol (VENTOLIN HFA) 108 (90 Base) MCG/ACT inhaler Inhale 2 puffs into the lungs every 6 (six) hours as needed for wheezing or shortness of breath.  . baclofen (LIORESAL) 10 MG tablet TAKE 1 TABLET(10 MG) BY MOUTH THREE TIMES DAILY (Patient taking differently: Take 10 mg by mouth 3 (three) times daily. )  . BREO ELLIPTA 100-25 MCG/INH AEPB   .  budesonide (PULMICORT) 0.5 MG/2ML nebulizer solution Inhale into the lungs.  . Fluticasone-Umeclidin-Vilant (TRELEGY ELLIPTA) 200-62.5-25 MCG/INH AEPB Inhale 1 puff into the lungs daily. Rinse mouth well after use  . gabapentin (NEURONTIN) 600 MG tablet TAKE 1 TABLET(600 MG) BY MOUTH FOUR TIMES DAILY (Patient taking differently: Take 600 mg by mouth 4 (four) times daily. )  . HYDROcodone-acetaminophen (NORCO/VICODIN) 5-325 MG tablet Take 1-2 tablets by mouth every 4 (four) hours as needed for severe pain ((score 7 to 10)).  Marland Kitchen ipratropium-albuterol (DUONEB) 0.5-2.5 (3) MG/3ML SOLN Inhale into the lungs.  Marland Kitchen lisinopril (ZESTRIL) 40 MG tablet Take by mouth.  . loratadine (CLARITIN) 10 MG tablet Take 1 tablet (10 mg total) by mouth daily.  . meloxicam (MOBIC) 15 MG tablet TAKE 1 TABLET(15 MG) BY MOUTH DAILY (Patient taking differently: Take 15 mg by mouth daily. )  . montelukast (SINGULAIR) 10 MG tablet TAKE 1 TABLET(10 MG) BY MOUTH AT BEDTIME (Patient taking differently: Take 10 mg by mouth at bedtime. )  . omeprazole (PRILOSEC) 20 MG capsule TAKE 1 CAPSULE BY MOUTH DAILY (Patient taking differently: Take 20 mg by mouth daily. )  . predniSONE (STERAPRED UNI-PAK 21 TAB) 10 MG (21) TBPK tablet Take as directed in package  . rOPINIRole (REQUIP) 2 MG tablet TAKE 1 TABLET BY MOUTH TWICE DAILY (Patient taking differently: Take 2 mg by mouth 2 (  two) times daily. )   No facility-administered encounter medications on file as of 08/02/2019.    PHYSICAL EXAMINATION:    VITALS:   Vitals:   08/02/19 1326  BP: (!) 147/100  Pulse: (!) 102  Resp: 16  SpO2: 100%  Weight: 158 lb 9.6 oz (71.9 kg)  Height: 6' (1.829 m)    GEN:  The patient appears stated age and is in NAD. HEENT:  Normocephalic, atraumatic.  The mucous membranes are moist. The superficial temporal arteries are without ropiness or tenderness. CV:  Tachy.  regular Lungs:  CTAB Neck/HEME:  There are no carotid bruits bilaterally. Skin: The  incision over the left abdomen is well-healed.  Neurological examination:  Orientation: The patient is alert and oriented x3. Cranial nerves: There is good facial symmetry. The speech is fluent and just minimally dysarthric. Soft palate rises symmetrically and there is no tongue deviation. Hearing is intact to conversational tone. Sensation: Sensation is intact to light touch throughout Motor: Strength is at least antigravity x4.  Movement examination: Tone: There is normal tone in the UE/LE Abnormal movements: There is no rest tremor.  There is no postural tremor.  There is no intention tremor.  When he is given a weight, he does have some intention tremor on the right hand side prior to programming. Coordination:  There is now decremation with RAM's,  Gait and Station: The patient ambulates well.   ASSESSMENT/PLAN:  1.   Essential Tremor  Status post VIM DBS in June, 2008 with Dr. Venetia Maxon.  IPG changed last in February, 2020.  Patient had to have IPG removed in October, 2020 after draining chest wound (draining for 4 months).  The IPG is now in the left abdomen.  Patient has had speech changes with the left brain lead.  This has been reprogrammed multiple times, but he ultimately always goes back to the original setting.  In addition, he keeps turning up the setting.  He and I discussed again today that more amplitude is not always better.  He states that he would rather have speech trouble than tremor.  I did reprogram his device today.   -Patient with poor compliance.  Discussed the importance of follow-up visits and making sure he is not ignoring his own health. 2.  Restless leg  -On gabapentin 3.  Hyperreflexia  -Patient with older DBS and cannot be scanned with MRI.  Total time spent on today's visit was 30 minutes, including both face-to-face time and nonface-to-face time.  Time included that spent on review of records (prior notes available to me/labs/imaging if pertinent),  discussing treatment and goals, answering patient's questions and coordinating care.  This did not include DBS programming time today.  Cc:  Reubin Milan, MD/

## 2019-07-30 DIAGNOSIS — M25512 Pain in left shoulder: Secondary | ICD-10-CM | POA: Diagnosis not present

## 2019-07-30 DIAGNOSIS — J441 Chronic obstructive pulmonary disease with (acute) exacerbation: Secondary | ICD-10-CM | POA: Diagnosis not present

## 2019-07-30 DIAGNOSIS — J449 Chronic obstructive pulmonary disease, unspecified: Secondary | ICD-10-CM | POA: Diagnosis not present

## 2019-07-30 DIAGNOSIS — J9621 Acute and chronic respiratory failure with hypoxia: Secondary | ICD-10-CM | POA: Diagnosis not present

## 2019-07-30 DIAGNOSIS — R918 Other nonspecific abnormal finding of lung field: Secondary | ICD-10-CM | POA: Diagnosis not present

## 2019-08-01 DIAGNOSIS — J45909 Unspecified asthma, uncomplicated: Secondary | ICD-10-CM | POA: Diagnosis not present

## 2019-08-01 DIAGNOSIS — J479 Bronchiectasis, uncomplicated: Secondary | ICD-10-CM | POA: Diagnosis not present

## 2019-08-01 DIAGNOSIS — T17800A Unspecified foreign body in other parts of respiratory tract causing asphyxiation, initial encounter: Secondary | ICD-10-CM | POA: Diagnosis not present

## 2019-08-02 ENCOUNTER — Encounter: Payer: Self-pay | Admitting: Neurology

## 2019-08-02 ENCOUNTER — Ambulatory Visit (INDEPENDENT_AMBULATORY_CARE_PROVIDER_SITE_OTHER): Payer: Medicare HMO | Admitting: Neurology

## 2019-08-02 ENCOUNTER — Other Ambulatory Visit: Payer: Self-pay

## 2019-08-02 VITALS — BP 147/100 | HR 102 | Resp 16 | Ht 72.0 in | Wt 158.6 lb

## 2019-08-02 DIAGNOSIS — G250 Essential tremor: Secondary | ICD-10-CM | POA: Diagnosis not present

## 2019-08-02 DIAGNOSIS — R471 Dysarthria and anarthria: Secondary | ICD-10-CM | POA: Diagnosis not present

## 2019-08-02 NOTE — Procedures (Signed)
DBS Programming was performed.    Manufacturer of DBS device: [employer]tronic  Total time spent programming was 30 minutes.  Device was confirmed to be on.  Soft start was confirmed to be on.  Impedences were checked and were within normal limits.  Battery was checked and was determined to be functioning normally and not near the end of life (2.96).  Final settings were as follows:   Active Contacts Amplitude (V) PW (ms) Frequency (hz)  Left Brain      08/02/19 8-C+ 3.9 70 140        Prior 2-C+ 4.7 60 160   2-1+ 3.7 90 135  Right Brain      08/02/19 0-C+ 2.7 60 140        Prior 7-C+ 3.7 90 160   7-C+ 3.4 110 135

## 2019-08-03 ENCOUNTER — Encounter: Payer: Medicare HMO | Admitting: Neurology

## 2019-08-03 NOTE — Telephone Encounter (Signed)
Routing to Dr. Gonzalez for f/u. 

## 2019-08-15 DIAGNOSIS — T17800S Unspecified foreign body in other parts of respiratory tract causing asphyxiation, sequela: Secondary | ICD-10-CM | POA: Diagnosis not present

## 2019-08-15 DIAGNOSIS — R918 Other nonspecific abnormal finding of lung field: Secondary | ICD-10-CM | POA: Diagnosis not present

## 2019-08-15 DIAGNOSIS — J4540 Moderate persistent asthma, uncomplicated: Secondary | ICD-10-CM | POA: Diagnosis not present

## 2019-08-15 DIAGNOSIS — J479 Bronchiectasis, uncomplicated: Secondary | ICD-10-CM | POA: Diagnosis not present

## 2019-08-16 ENCOUNTER — Telehealth: Payer: Self-pay | Admitting: Internal Medicine

## 2019-08-16 NOTE — Telephone Encounter (Signed)
Pt needs a handicap sticker, he wants to know if he needs to come in in order to have one filled out. Please advise

## 2019-08-16 NOTE — Telephone Encounter (Signed)
He does not need to come in for an appt. I will get this done and place up front for patient to pick up. Please call pt to pick up the form.

## 2019-08-21 DIAGNOSIS — J441 Chronic obstructive pulmonary disease with (acute) exacerbation: Secondary | ICD-10-CM | POA: Diagnosis not present

## 2019-08-21 DIAGNOSIS — M25512 Pain in left shoulder: Secondary | ICD-10-CM | POA: Diagnosis not present

## 2019-08-21 DIAGNOSIS — J9621 Acute and chronic respiratory failure with hypoxia: Secondary | ICD-10-CM | POA: Diagnosis not present

## 2019-08-21 DIAGNOSIS — R918 Other nonspecific abnormal finding of lung field: Secondary | ICD-10-CM | POA: Diagnosis not present

## 2019-08-21 DIAGNOSIS — J449 Chronic obstructive pulmonary disease, unspecified: Secondary | ICD-10-CM | POA: Diagnosis not present

## 2019-08-23 ENCOUNTER — Other Ambulatory Visit: Payer: Self-pay | Admitting: Internal Medicine

## 2019-08-28 DIAGNOSIS — J9621 Acute and chronic respiratory failure with hypoxia: Secondary | ICD-10-CM | POA: Diagnosis not present

## 2019-08-28 DIAGNOSIS — J441 Chronic obstructive pulmonary disease with (acute) exacerbation: Secondary | ICD-10-CM | POA: Diagnosis not present

## 2019-08-28 DIAGNOSIS — J449 Chronic obstructive pulmonary disease, unspecified: Secondary | ICD-10-CM | POA: Diagnosis not present

## 2019-08-28 DIAGNOSIS — M25512 Pain in left shoulder: Secondary | ICD-10-CM | POA: Diagnosis not present

## 2019-08-28 DIAGNOSIS — R918 Other nonspecific abnormal finding of lung field: Secondary | ICD-10-CM | POA: Diagnosis not present

## 2019-09-01 DIAGNOSIS — T17800S Unspecified foreign body in other parts of respiratory tract causing asphyxiation, sequela: Secondary | ICD-10-CM | POA: Diagnosis not present

## 2019-09-01 DIAGNOSIS — J479 Bronchiectasis, uncomplicated: Secondary | ICD-10-CM | POA: Diagnosis not present

## 2019-09-01 DIAGNOSIS — X58XXXS Exposure to other specified factors, sequela: Secondary | ICD-10-CM | POA: Diagnosis not present

## 2019-09-01 DIAGNOSIS — R0602 Shortness of breath: Secondary | ICD-10-CM | POA: Diagnosis not present

## 2019-09-01 DIAGNOSIS — J4540 Moderate persistent asthma, uncomplicated: Secondary | ICD-10-CM | POA: Diagnosis not present

## 2019-09-12 ENCOUNTER — Other Ambulatory Visit: Payer: Self-pay | Admitting: Pulmonary Disease

## 2019-09-16 NOTE — Telephone Encounter (Signed)
I do not see letter template that fits this I will work on this early in the week.

## 2019-09-18 DIAGNOSIS — J441 Chronic obstructive pulmonary disease with (acute) exacerbation: Secondary | ICD-10-CM | POA: Diagnosis not present

## 2019-09-18 DIAGNOSIS — J449 Chronic obstructive pulmonary disease, unspecified: Secondary | ICD-10-CM | POA: Diagnosis not present

## 2019-09-18 DIAGNOSIS — J9621 Acute and chronic respiratory failure with hypoxia: Secondary | ICD-10-CM | POA: Diagnosis not present

## 2019-09-18 DIAGNOSIS — R918 Other nonspecific abnormal finding of lung field: Secondary | ICD-10-CM | POA: Diagnosis not present

## 2019-09-18 DIAGNOSIS — M25512 Pain in left shoulder: Secondary | ICD-10-CM | POA: Diagnosis not present

## 2019-09-21 NOTE — Telephone Encounter (Signed)
Sent to Lauren in regards to help with a template for Dr. Reece Agar.

## 2019-09-21 NOTE — Telephone Encounter (Signed)
Just checking the status of discharge letter for patient. Please advise.

## 2019-09-21 NOTE — Telephone Encounter (Signed)
Can you help with this template for Dr. Jayme Cloud.

## 2019-09-21 NOTE — Telephone Encounter (Signed)
Just need a template for

## 2019-09-23 ENCOUNTER — Encounter: Payer: Self-pay | Admitting: *Deleted

## 2019-09-23 NOTE — Progress Notes (Signed)
Letter created, printed for Dr. Jayme Cloud, She will be back in office October 05, 2019. It will be ready for her to sign form.  Will route to LG as FYI

## 2019-09-23 NOTE — Telephone Encounter (Signed)
Letter was sent to Dr. Jayme Cloud.  She returns to office on 10/05/19

## 2019-09-27 DIAGNOSIS — J441 Chronic obstructive pulmonary disease with (acute) exacerbation: Secondary | ICD-10-CM | POA: Diagnosis not present

## 2019-09-27 DIAGNOSIS — J9621 Acute and chronic respiratory failure with hypoxia: Secondary | ICD-10-CM | POA: Diagnosis not present

## 2019-09-27 DIAGNOSIS — R918 Other nonspecific abnormal finding of lung field: Secondary | ICD-10-CM | POA: Diagnosis not present

## 2019-09-27 DIAGNOSIS — J449 Chronic obstructive pulmonary disease, unspecified: Secondary | ICD-10-CM | POA: Diagnosis not present

## 2019-09-27 DIAGNOSIS — M25512 Pain in left shoulder: Secondary | ICD-10-CM | POA: Diagnosis not present

## 2019-10-03 NOTE — Telephone Encounter (Signed)
Letter signed.  Being sent today.

## 2019-10-05 NOTE — Progress Notes (Signed)
Certified letter sent to patient.   Gailen Shelter, MD [facility]

## 2019-10-07 NOTE — Telephone Encounter (Signed)
Noted, waiting on signed receipt

## 2019-10-10 DIAGNOSIS — I83893 Varicose veins of bilateral lower extremities with other complications: Secondary | ICD-10-CM | POA: Diagnosis not present

## 2019-10-19 DIAGNOSIS — M25512 Pain in left shoulder: Secondary | ICD-10-CM | POA: Diagnosis not present

## 2019-10-19 DIAGNOSIS — J449 Chronic obstructive pulmonary disease, unspecified: Secondary | ICD-10-CM | POA: Diagnosis not present

## 2019-10-19 DIAGNOSIS — J441 Chronic obstructive pulmonary disease with (acute) exacerbation: Secondary | ICD-10-CM | POA: Diagnosis not present

## 2019-10-19 DIAGNOSIS — J9621 Acute and chronic respiratory failure with hypoxia: Secondary | ICD-10-CM | POA: Diagnosis not present

## 2019-10-19 DIAGNOSIS — R918 Other nonspecific abnormal finding of lung field: Secondary | ICD-10-CM | POA: Diagnosis not present

## 2019-10-26 NOTE — Telephone Encounter (Signed)
Per David Rhodes letter was sent certified mail and we are still awaiting receipt.

## 2019-10-27 ENCOUNTER — Other Ambulatory Visit: Payer: Self-pay | Admitting: Internal Medicine

## 2019-10-28 DIAGNOSIS — J449 Chronic obstructive pulmonary disease, unspecified: Secondary | ICD-10-CM | POA: Diagnosis not present

## 2019-10-28 DIAGNOSIS — J441 Chronic obstructive pulmonary disease with (acute) exacerbation: Secondary | ICD-10-CM | POA: Diagnosis not present

## 2019-10-28 DIAGNOSIS — M25512 Pain in left shoulder: Secondary | ICD-10-CM | POA: Diagnosis not present

## 2019-10-28 DIAGNOSIS — J9621 Acute and chronic respiratory failure with hypoxia: Secondary | ICD-10-CM | POA: Diagnosis not present

## 2019-10-28 DIAGNOSIS — R918 Other nonspecific abnormal finding of lung field: Secondary | ICD-10-CM | POA: Diagnosis not present

## 2019-10-31 ENCOUNTER — Other Ambulatory Visit: Payer: Self-pay | Admitting: Internal Medicine

## 2019-10-31 NOTE — Telephone Encounter (Signed)
Requested Prescriptions  Pending Prescriptions Disp Refills  . albuterol (VENTOLIN HFA) 108 (90 Base) MCG/ACT inhaler [Pharmacy Med Name: ALBUTEROL HFA INH(200 PUFFS)18GM] 18 g 5    Sig: INHALE 2 PUFFS INTO THE LUNGS EVERY 6 HOURS AS NEEDED FOR WHEEZING OR SHORTNESS OF BREATH     Pulmonology:  Beta Agonists Failed - 10/31/2019  5:18 PM      Failed - One inhaler should last at least one month. If the patient is requesting refills earlier, contact the patient to check for uncontrolled symptoms.      Passed - Valid encounter within last 12 months    Recent Outpatient Visits          5 months ago Acute pain of both shoulders   [facility] Reubin Milan, MD   6 months ago Annual physical exam   [facility] Reubin Milan, MD   1 year ago Essential hypertension   [facility] Reubin Milan, MD   1 year ago COPD with acute exacerbation [facility])   [facility] Reubin Milan, MD   1 year ago Community acquired pneumonia, unspecified laterality   [facility] Reubin Milan, MD      Future Appointments            In 5 months Judithann Graves Nyoka Cowden, MD [facility], [facility]

## 2019-11-01 NOTE — Telephone Encounter (Signed)
David Rhodes has check on this the last 2 days and we have not received anything as of yet.   Went to [employer] and typed in tracking number:  Track your package Data provided by [employer] number [id number] Package returned to sender  We will wait to see if we get letter back in the mail.

## 2019-11-09 NOTE — Telephone Encounter (Signed)
Nothing received yet. 

## 2019-11-10 NOTE — Telephone Encounter (Signed)
Nothing received as of this morning

## 2019-11-16 NOTE — Telephone Encounter (Signed)
We have not received anything at this time

## 2019-11-18 DIAGNOSIS — J9621 Acute and chronic respiratory failure with hypoxia: Secondary | ICD-10-CM | POA: Diagnosis not present

## 2019-11-18 DIAGNOSIS — R918 Other nonspecific abnormal finding of lung field: Secondary | ICD-10-CM | POA: Diagnosis not present

## 2019-11-18 DIAGNOSIS — J449 Chronic obstructive pulmonary disease, unspecified: Secondary | ICD-10-CM | POA: Diagnosis not present

## 2019-11-18 DIAGNOSIS — J441 Chronic obstructive pulmonary disease with (acute) exacerbation: Secondary | ICD-10-CM | POA: Diagnosis not present

## 2019-11-18 DIAGNOSIS — M25512 Pain in left shoulder: Secondary | ICD-10-CM | POA: Diagnosis not present

## 2019-11-23 DIAGNOSIS — I83893 Varicose veins of bilateral lower extremities with other complications: Secondary | ICD-10-CM | POA: Diagnosis not present

## 2019-12-07 DIAGNOSIS — I83893 Varicose veins of bilateral lower extremities with other complications: Secondary | ICD-10-CM | POA: Diagnosis not present

## 2019-12-19 DIAGNOSIS — M25512 Pain in left shoulder: Secondary | ICD-10-CM | POA: Diagnosis not present

## 2019-12-19 DIAGNOSIS — J449 Chronic obstructive pulmonary disease, unspecified: Secondary | ICD-10-CM | POA: Diagnosis not present

## 2019-12-19 DIAGNOSIS — R918 Other nonspecific abnormal finding of lung field: Secondary | ICD-10-CM | POA: Diagnosis not present

## 2019-12-19 DIAGNOSIS — J441 Chronic obstructive pulmonary disease with (acute) exacerbation: Secondary | ICD-10-CM | POA: Diagnosis not present

## 2019-12-19 DIAGNOSIS — J9621 Acute and chronic respiratory failure with hypoxia: Secondary | ICD-10-CM | POA: Diagnosis not present

## 2019-12-26 ENCOUNTER — Ambulatory Visit: Payer: Self-pay

## 2019-12-26 ENCOUNTER — Other Ambulatory Visit: Payer: Self-pay

## 2019-12-26 ENCOUNTER — Ambulatory Visit
Admission: EM | Admit: 2019-12-26 | Discharge: 2019-12-26 | Disposition: A | Discharge status: Home / Self Care | Payer: Medicare HMO | Attending: Family Medicine | Admitting: Family Medicine

## 2019-12-26 ENCOUNTER — Encounter: Payer: Self-pay | Admitting: Emergency Medicine

## 2019-12-26 DIAGNOSIS — L255 Unspecified contact dermatitis due to plants, except food: Secondary | ICD-10-CM

## 2019-12-26 MED ORDER — PREDNISONE 10 MG PO TABS: ORAL_TABLET | ORAL | 0 refills | Status: DC

## 2019-12-26 NOTE — Telephone Encounter (Signed)
Reviewed.  CM 

## 2019-12-26 NOTE — ED Triage Notes (Signed)
Pt c/o rash that he says is poison ivy. Started about 2 days ago. Rash is located on bilateral arms, face and neck.

## 2019-12-26 NOTE — Telephone Encounter (Signed)
Patient called and says he's been out in poison ivy or oak and has a rash since Saturday. He says the rash is pretty large covering his arms, face and neck. He says he hasn't used anything OTC for itching and the itching is real bad. He says it's red, raised rash. I advised no availability with Dr. Judithann Graves today, but an available slot with Dr. Yetta Barre. He says he only wants to see Dr. Judithann Graves. I placed him on hold and called the office, spoke to [place], [facility] She asked if patient could be put into the Same Day opening tomorrow at 1140, she got approval and scheduled the patient. I advised the patient of the appointment and he says he will just go to the walk in clinic next to the office today, because he can't stand another day waiting. I advised to call the office for follow up if he needs to, he verbalized understanding.  Reason for Disposition . SEVERE itching (e.g., interferes with sleep or normal activities)  Answer Assessment - Initial Assessment Questions 1. APPEARANCE of RASH: "Describe the rash."      Red, raised 2. LOCATION: "Where is the rash located?"      Arms, neck and face 3. SIZE: "How large is the rash?"      Pretty big covering 4. ONSET: "When did the rash begin?"      Noticed it Saturday morning 5. ITCHING: "Does the rash itch?" If Yes, ask: "How bad is it?"   - MILD - doesn't interfere with normal activities   - MODERATE-SEVERE: interferes with work, school, sleep, or other activities      Moderate-severe 6. PREGNANCY: "Is there any chance you are pregnant?" "When was your last menstrual period?"     N/A  Protocols used: POISON IVY - OAK - SUMAC-A-AH

## 2019-12-27 ENCOUNTER — Other Ambulatory Visit: Payer: Self-pay | Admitting: Internal Medicine

## 2019-12-27 ENCOUNTER — Ambulatory Visit: Payer: Medicare HMO | Admitting: Internal Medicine

## 2019-12-27 NOTE — Telephone Encounter (Signed)
Requested medication (s) are due for refill today: yes  Requested medication (s) are on the active medication list: yes  Last refill:  11/28/19  Future visit scheduled: yes  Notes to clinic: historical provider    Requested Prescriptions  Pending Prescriptions Disp Refills   BREO ELLIPTA 100-25 MCG/INH AEPB [Pharmacy Med Name: BREO ELLIPTA 100-25MCG ORAL INH(30)] 60 each     Sig: INHALE 1 PUFF INTO THE LUNGS DAILY      Pulmonology:  Combination Products Passed - 12/27/2019  1:24 PM      Passed - Valid encounter within last 12 months    Recent Outpatient Visits           7 months ago Acute pain of both shoulders   [facility] Reubin Milan, MD   8 months ago Annual physical exam   [facility] Reubin Milan, MD   1 year ago Essential hypertension   [facility] Reubin Milan, MD   2 years ago COPD with acute exacerbation [facility])   [facility] Reubin Milan, MD   2 years ago Community acquired pneumonia, unspecified laterality   [facility] Reubin Milan, MD       Future Appointments             In 3 months Judithann Graves Nyoka Cowden, MD [facility], [facility]

## 2019-12-27 NOTE — ED Provider Notes (Signed)
[facility]    CSN: [id number] Arrival date & time: 12/26/19  1545  History   Chief Complaint Chief Complaint  Patient presents with  . Rash   HPI  64 year old male presents with rash.  2-day history of rash.  Located on the arms, face, neck.  He believes that this is secondary to poison oak/ivy/oak or sumac.  Itches.  No relieving factors.  No other complaints or concerns at this time.  Past Medical History:  Diagnosis Date  . Allergy    Hay fever  . Anxiety   . Asthma   . Chronic pain due to trauma   . COPD (chronic obstructive pulmonary disease) (HCC)   . Depression   . Essential tremor   . GERD (gastroesophageal reflux disease)   . Headache(784.0)    neck pain  . Hypertension   . 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 Inguinal hernia    bilateral  . Pneumonia    many years ago  . Shortness of breath    when allergies flare up  . Urine incontinence   . Wears glasses     Patient Active Problem List   Diagnosis Date Noted  . Acute respiratory failure with hypoxia (HCC) 06/19/2019  . Leukocytosis 04/15/2019  . COPD exacerbation (HCC) 12/30/2018  . Acute on chronic respiratory failure with hypoxemia (HCC) 12/25/2018  . Inguinal hernia recurrent bilateral 11/16/2018  . Chronic left shoulder pain 10/05/2018  . Tremor 08/10/2018  . PICC (peripherally inserted central catheter) in place 04/16/2018  . Essential tremor 10/18/2017  . Cervical radiculopathy 07/23/2017  . Bilateral temporomandibular joint pain 07/23/2017  . Neck pain 05/19/2017  . Medication monitoring encounter 09/11/2016  . Pulmonary nodules 12/31/2015  . COPD (chronic obstructive pulmonary disease) (HCC) 12/27/2015  . Chronic cough 12/27/2015  . Asthma 09/24/2015  . Allergic rhinitis 11/11/2014  . Nerve root pain 11/11/2014  . Dyskinesia 11/11/2014  . Acid reflux 11/11/2014  . Calcium blood increased 11/11/2014  . Affective disorder (HCC) 11/11/2014  . Essential  hypertension 12/25/2011  . Chronic back pain 12/25/2011    Past Surgical History:  Procedure Laterality Date  . DEEP BRAIN STIMULATOR PLACEMENT    . INGUINAL HERNIA REPAIR Bilateral 02/07/2014   Procedure:  BILATERAL INGUINAL HERNIA REPAIR;  Surgeon: Axel Filler, MD;  Location: [facility] OR;  Service: General;  Laterality: Bilateral;  . INSERTION OF MESH Bilateral 02/07/2014   Procedure: INSERTION OF MESH;  Surgeon: Axel Filler, MD;  Location: [facility] OR;  Service: General;  Laterality: Bilateral;  . NASAL SINUS SURGERY    . PULSE GENERATOR IMPLANT Left 09/11/2016   Procedure: Left chest-Change implantable pulse generator battery;  Surgeon: Maeola Harman, MD;  Location: [facility] OR;  Service: Neurosurgery;  Laterality: Left;  Left chest-Change implantable pulse generator battery  . PULSE GENERATOR IMPLANT Left 04/06/2018   Procedure: Revision of left chest implantable pulse generator, extension, and pocket adapter;  Surgeon: Maeola Harman, MD;  Location: [facility] OR;  Service: Neurosurgery;  Laterality: Left;  . SUBTHALAMIC STIMULATOR BATTERY REPLACEMENT  12/09/2011   Procedure: SUBTHALAMIC STIMULATOR BATTERY REPLACEMENT;  Surgeon: Maeola Harman, MD;  Location: [facility] NEURO ORS;  Service: Neurosurgery;  Laterality: N/A;   deep brain stimulator, implantable pulse generator change  . SUBTHALAMIC STIMULATOR BATTERY REPLACEMENT Left 04/21/2014   Procedure: Deep brain stimulator battery change;  Surgeon: Maeola Harman, MD;  Location: [facility] NEURO ORS;  Service: Neurosurgery;  Laterality: Left;  Deep brain stimulator battery change  . SUBTHALAMIC STIMULATOR BATTERY REPLACEMENT  N/A 08/10/2018   Procedure: Replacement of deep brain stimulator pulse generator/left abdomen with long lead extensions;  Surgeon: Maeola Harman, MD;  Location: [facility] OR;  Service: Neurosurgery;  Laterality: N/A;       Home Medications    Prior to Admission medications   Medication Sig Start Date End Date Taking? Authorizing Provider  albuterol (VENTOLIN  HFA) 108 (90 Base) MCG/ACT inhaler INHALE 2 PUFFS INTO THE LUNGS EVERY 6 HOURS AS NEEDED FOR WHEEZING OR SHORTNESS OF BREATH 10/31/19  Yes Reubin Milan, MD  baclofen (LIORESAL) 10 MG tablet TAKE 1 TABLET(10 MG) BY MOUTH THREE TIMES DAILY Patient taking differently: Take 10 mg by mouth 3 (three) times daily.  05/08/19  Yes Reubin Milan, MD  BREO ELLIPTA 100-25 MCG/INH AEPB  07/20/19  Yes [provider]  budesonide (PULMICORT) 0.5 MG/2ML nebulizer solution Inhale into the lungs. 07/25/19 07/24/20 Yes [provider]  Fluticasone-Umeclidin-Vilant (TRELEGY ELLIPTA) 200-62.5-25 MCG/INH AEPB Inhale 1 puff into the lungs daily. Rinse mouth well after use 06/30/19  Yes Salena Saner, MD  gabapentin (NEURONTIN) 600 MG tablet TAKE 1 TABLET(600 MG) BY MOUTH FOUR TIMES DAILY 10/27/19  Yes Reubin Milan, MD  ipratropium-albuterol (DUONEB) 0.5-2.5 (3) MG/3ML SOLN Inhale into the lungs. 07/25/19 07/24/20 Yes [provider]  lisinopril (ZESTRIL) 40 MG tablet Take by mouth.   Yes [provider]  loratadine (CLARITIN) 10 MG tablet Take 1 tablet (10 mg total) by mouth daily. 10/13/17  Yes Enedina Finner, MD  meloxicam (MOBIC) 15 MG tablet TAKE 1 TABLET(15 MG) BY MOUTH DAILY Patient taking differently: Take 15 mg by mouth daily.  12/06/18  Yes Reubin Milan, MD  montelukast (SINGULAIR) 10 MG tablet TAKE 1 TABLET(10 MG) BY MOUTH AT BEDTIME Patient taking differently: Take 10 mg by mouth at bedtime.  05/08/19  Yes Reubin Milan, MD  omeprazole (PRILOSEC) 20 MG capsule TAKE 1 CAPSULE BY MOUTH DAILY Patient taking differently: Take 20 mg by mouth daily.  12/05/18  Yes Reubin Milan, MD  rOPINIRole (REQUIP) 2 MG tablet TAKE 1 TABLET BY MOUTH TWICE DAILY 08/23/19  Yes Reubin Milan, MD  predniSONE (DELTASONE) 10 MG tablet 60 mg daily x 3 days, 50 mg daily x 3 days, then 40 mg daily x 3 days, then 30 mg daily x 3 days, then 20 mg daily x 3 days, then 10 mg daily x 3 days.  12/26/19   Tommie Sams, DO    Family History Family History  Problem Relation Age of Onset  . Myoclonus Mother   . Breast cancer Mother   . Prostate cancer Father   . Heart disease Paternal Grandfather   . Myoclonus Maternal Uncle     Social History Social History   Tobacco Use  . Smoking status: Never Smoker  . Smokeless tobacco: Current User    Types: Chew  Vaping Use  . Vaping Use: Never used  Substance Use Topics  . Alcohol use: No    Alcohol/week: 0.0 standard drinks  . Drug use: No     Allergies   Penicillins   Review of Systems Review of Systems  Skin: Positive for rash.     Physical Exam Triage Vital Signs ED Triage Vitals  Enc Vitals Group     BP 12/26/19 1636 (!) 134/102     Pulse Rate 12/26/19 1636 99     Resp 12/26/19 1636 (!) 22     Temp 12/26/19 1636 98.4 F (36.9 C)  Temp Source 12/26/19 1636 Oral     SpO2 12/26/19 1636 100 %     Weight 12/26/19 1632 158 lb 8.2 oz (71.9 kg)     Height 12/26/19 1632 6' (1.829 m)     Head Circumference --      Peak Flow --      Pain Score 12/26/19 1632 0     Pain Loc --      Pain Edu? --      Excl. in GC? --    Updated Vital Signs BP (!) 134/102 (BP Location: Right Arm)   Pulse 99   Temp 98.4 F (36.9 C) (Oral)   Resp (!) 22   Ht 6' (1.829 m)   Wt 71.9 kg   SpO2 100%   BMI 21.50 kg/m   Visual Acuity Right Eye Distance:   Left Eye Distance:   Bilateral Distance:    Right Eye Near:   Left Eye Near:    Bilateral Near:     Physical Exam Constitutional:      Comments: Thin, chronically ill appearing; on [place] O2  HENT:     Head: Normocephalic and atraumatic.  Skin:    Comments: Arms, face with diffuse erythematous vesicular rash.  Neurological:     Mental Status: He is alert.  Psychiatric:        Mood and Affect: Mood normal.        Behavior: Behavior normal.    UC Treatments / Results  Labs (all labs ordered are listed, but only abnormal results are displayed) Labs Reviewed -  No data to display  EKG   Radiology No results found.  Procedures Procedures (including critical care time)  Medications Ordered in UC Medications - No data to display  Initial Impression / Assessment and Plan / UC Course  I have reviewed the triage vital signs and the nursing notes.  Pertinent labs & imaging results that were available during my care of the patient were reviewed by me and considered in my medical decision making (see chart for details).    64 year old male presents with dermatitis secondary to poison ivy/oak/sumac.  Treating with prednisone.  Final Clinical Impressions(s) / UC Diagnoses   Final diagnoses:  Dermatitis due to plants, including poison ivy, sumac, and oak   Discharge Instructions   None    ED Prescriptions    Medication Sig Dispense Auth. Provider   predniSONE (DELTASONE) 10 MG tablet 60 mg daily x 3 days, 50 mg daily x 3 days, then 40 mg daily x 3 days, then 30 mg daily x 3 days, then 20 mg daily x 3 days, then 10 mg daily x 3 days. 63 tablet [place], [place] G, DO     PDMP not reviewed this encounter.   Tommie Sams, [place] 12/27/19 [phone]

## 2019-12-30 ENCOUNTER — Telehealth: Payer: Self-pay | Admitting: Internal Medicine

## 2019-12-30 NOTE — Telephone Encounter (Signed)
Copied from CRM [id number]. Topic: Quick Communication - See Telephone Encounter >> Dec 30, 2019 11:34 AM Jens Som A wrote: CRM for notification. See Telephone encounter for: 12/30/19. Helmut Muster from [facility] is calling to confirm a telephone number for the patient.

## 2020-01-18 DIAGNOSIS — R918 Other nonspecific abnormal finding of lung field: Secondary | ICD-10-CM | POA: Diagnosis not present

## 2020-01-18 DIAGNOSIS — J441 Chronic obstructive pulmonary disease with (acute) exacerbation: Secondary | ICD-10-CM | POA: Diagnosis not present

## 2020-01-18 DIAGNOSIS — M25512 Pain in left shoulder: Secondary | ICD-10-CM | POA: Diagnosis not present

## 2020-01-18 DIAGNOSIS — J9621 Acute and chronic respiratory failure with hypoxia: Secondary | ICD-10-CM | POA: Diagnosis not present

## 2020-01-18 DIAGNOSIS — J449 Chronic obstructive pulmonary disease, unspecified: Secondary | ICD-10-CM | POA: Diagnosis not present

## 2020-01-31 ENCOUNTER — Telehealth: Payer: Self-pay | Admitting: Internal Medicine

## 2020-01-31 NOTE — Telephone Encounter (Unsigned)
Copied from CRM [id number]. Topic: General - Call Back - No Documentation >> Jan 31, 2020  1:57 PM Randol Kern wrote: Reason for CRM: Duwaine Maxin, from [employer] called requesting to speak to the office regarding patient's stand in order for oxygen. Please advise  Best contact: [phone]

## 2020-02-18 DIAGNOSIS — J441 Chronic obstructive pulmonary disease with (acute) exacerbation: Secondary | ICD-10-CM | POA: Diagnosis not present

## 2020-02-18 DIAGNOSIS — J9621 Acute and chronic respiratory failure with hypoxia: Secondary | ICD-10-CM | POA: Diagnosis not present

## 2020-02-18 DIAGNOSIS — R918 Other nonspecific abnormal finding of lung field: Secondary | ICD-10-CM | POA: Diagnosis not present

## 2020-02-18 DIAGNOSIS — J449 Chronic obstructive pulmonary disease, unspecified: Secondary | ICD-10-CM | POA: Diagnosis not present

## 2020-02-18 DIAGNOSIS — M25512 Pain in left shoulder: Secondary | ICD-10-CM | POA: Diagnosis not present

## 2020-03-07 DIAGNOSIS — R05 Cough: Secondary | ICD-10-CM | POA: Diagnosis not present

## 2020-03-07 DIAGNOSIS — J470 Bronchiectasis with acute lower respiratory infection: Secondary | ICD-10-CM | POA: Diagnosis not present

## 2020-03-07 DIAGNOSIS — J441 Chronic obstructive pulmonary disease with (acute) exacerbation: Secondary | ICD-10-CM | POA: Diagnosis not present

## 2020-03-07 DIAGNOSIS — Z03818 Encounter for observation for suspected exposure to other biological agents ruled out: Secondary | ICD-10-CM | POA: Diagnosis not present

## 2020-03-20 DIAGNOSIS — J441 Chronic obstructive pulmonary disease with (acute) exacerbation: Secondary | ICD-10-CM | POA: Diagnosis not present

## 2020-03-20 DIAGNOSIS — M25512 Pain in left shoulder: Secondary | ICD-10-CM | POA: Diagnosis not present

## 2020-03-20 DIAGNOSIS — J9621 Acute and chronic respiratory failure with hypoxia: Secondary | ICD-10-CM | POA: Diagnosis not present

## 2020-03-20 DIAGNOSIS — J449 Chronic obstructive pulmonary disease, unspecified: Secondary | ICD-10-CM | POA: Diagnosis not present

## 2020-03-20 DIAGNOSIS — R918 Other nonspecific abnormal finding of lung field: Secondary | ICD-10-CM | POA: Diagnosis not present

## 2020-03-31 DIAGNOSIS — J441 Chronic obstructive pulmonary disease with (acute) exacerbation: Secondary | ICD-10-CM | POA: Diagnosis not present

## 2020-03-31 DIAGNOSIS — I10 Essential (primary) hypertension: Secondary | ICD-10-CM | POA: Diagnosis not present

## 2020-03-31 DIAGNOSIS — R0602 Shortness of breath: Secondary | ICD-10-CM | POA: Diagnosis not present

## 2020-03-31 DIAGNOSIS — Z79899 Other long term (current) drug therapy: Secondary | ICD-10-CM | POA: Diagnosis not present

## 2020-03-31 DIAGNOSIS — Z20822 Contact with and (suspected) exposure to covid-19: Secondary | ICD-10-CM | POA: Diagnosis not present

## 2020-03-31 DIAGNOSIS — Z9981 Dependence on supplemental oxygen: Secondary | ICD-10-CM | POA: Diagnosis not present

## 2020-04-01 ENCOUNTER — Other Ambulatory Visit: Payer: Self-pay | Admitting: Internal Medicine

## 2020-04-18 ENCOUNTER — Encounter: Payer: Self-pay | Admitting: Internal Medicine

## 2020-04-18 ENCOUNTER — Ambulatory Visit (INDEPENDENT_AMBULATORY_CARE_PROVIDER_SITE_OTHER): Payer: Medicare HMO | Admitting: Internal Medicine

## 2020-04-18 ENCOUNTER — Other Ambulatory Visit: Payer: Self-pay

## 2020-04-18 VITALS — BP 140/82 | HR 79 | Ht 72.0 in | Wt 152.0 lb

## 2020-04-18 DIAGNOSIS — G250 Essential tremor: Secondary | ICD-10-CM | POA: Diagnosis not present

## 2020-04-18 DIAGNOSIS — I10 Essential (primary) hypertension: Secondary | ICD-10-CM

## 2020-04-18 DIAGNOSIS — Z0000 Encounter for general adult medical examination without abnormal findings: Secondary | ICD-10-CM

## 2020-04-18 DIAGNOSIS — Z9981 Dependence on supplemental oxygen: Secondary | ICD-10-CM | POA: Diagnosis not present

## 2020-04-18 DIAGNOSIS — R69 Illness, unspecified: Secondary | ICD-10-CM | POA: Diagnosis not present

## 2020-04-18 DIAGNOSIS — J439 Emphysema, unspecified: Secondary | ICD-10-CM

## 2020-04-18 DIAGNOSIS — F39 Unspecified mood [affective] disorder: Secondary | ICD-10-CM

## 2020-04-18 DIAGNOSIS — Z125 Encounter for screening for malignant neoplasm of prostate: Secondary | ICD-10-CM | POA: Diagnosis not present

## 2020-04-18 LAB — POC URINALYSIS WITH MICROSCOPIC (NON AUTO)MANUAL RESULT
Bilirubin, UA: NEGATIVE
Crystals: POSITIVE
Glucose, UA: NEGATIVE
Ketones, UA: NEGATIVE
Mucus, UA: 0
Nitrite, UA: NEGATIVE
Protein, UA: NEGATIVE
RBC: 0 M/uL — AB (ref 4.69–6.13)
Spec Grav, UA: 1.015 (ref 1.010–1.025)
Urobilinogen, UA: 0.2 E.U./dL
WBC Casts, UA: 4
pH, UA: 5 (ref 5.0–8.0)

## 2020-04-18 MED ORDER — GABAPENTIN 600 MG PO TABS: 600.0000 mg | ORAL_TABLET | Freq: Four times a day (QID) | ORAL | 1 refills | Status: DC

## 2020-04-18 MED ORDER — BACLOFEN 10 MG PO TABS
10.0000 mg | ORAL_TABLET | Freq: Three times a day (TID) | ORAL | 1 refills | Status: DC
Start: 2020-04-18 — End: 2020-09-12

## 2020-04-18 MED ORDER — BREO ELLIPTA 100-25 MCG/INH IN AEPB: 1.0000 | INHALATION_SPRAY | Freq: Every day | RESPIRATORY_TRACT | 5 refills | Status: DC

## 2020-04-18 MED ORDER — ALBUTEROL SULFATE HFA 108 (90 BASE) MCG/ACT IN AERS: 2.0000 | INHALATION_SPRAY | RESPIRATORY_TRACT | 5 refills | Status: DC | PRN
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
## 2020-04-18 NOTE — Patient Instructions (Signed)
Dr. Gerre Couch - pulmonary Address: [address] [place] [address], [place] [address] Hours:  Open ? Closes 5PM Phone: [phone]

## 2020-04-19 LAB — LIPID PANEL
Chol/HDL Ratio: 2.5 ratio (ref 0.0–5.0)
Cholesterol, Total: 154 mg/dL (ref 100–199)
HDL: 62 mg/dL (ref >39)
LDL Chol Calc (NIH): 76 mg/dL (ref 0–99)
Triglycerides: 85 mg/dL (ref 0–149)
VLDL Cholesterol Cal: 16 mg/dL (ref 5–40)

## 2020-04-19 LAB — CBC WITH DIFFERENTIAL/PLATELET
Basophils Absolute: 0.1 10*3/uL (ref 0.0–0.2)
Basos: 1 %
EOS (ABSOLUTE): 0.9 10*3/uL — ABNORMAL HIGH (ref 0.0–0.4)
Eos: 9 %
Hematocrit: 48.4 % (ref 37.5–51.0)
Hemoglobin: 15.8 g/dL (ref 13.0–17.7)
Immature Grans (Abs): 0 10*3/uL (ref 0.0–0.1)
Immature Granulocytes: 0 %
Lymphocytes Absolute: 2.3 10*3/uL (ref 0.7–3.1)
Lymphs: 21 %
MCH: 29.1 pg (ref 26.6–33.0)
MCHC: 32.6 g/dL (ref 31.5–35.7)
MCV: 89 fL (ref 79–97)
Monocytes Absolute: 0.8 10*3/uL (ref 0.1–0.9)
Monocytes: 7 %
Neutrophils Absolute: 6.6 10*3/uL (ref 1.4–7.0)
Neutrophils: 62 %
Platelets: 217 10*3/uL (ref 150–450)
RBC: 5.43 x10E6/uL (ref 4.14–5.80)
RDW: 13 % (ref 11.6–15.4)
WBC: 10.8 10*3/uL (ref 3.4–10.8)

## 2020-04-19 LAB — COMPREHENSIVE METABOLIC PANEL
ALT: 12 IU/L (ref 0–44)
AST: 13 IU/L (ref 0–40)
Albumin/Globulin Ratio: 1.8 (ref 1.2–2.2)
Albumin: 4.1 g/dL (ref 3.8–4.8)
Alkaline Phosphatase: 111 IU/L (ref 44–121)
BUN/Creatinine Ratio: 7 — ABNORMAL LOW (ref 10–24)
BUN: 7 mg/dL — ABNORMAL LOW (ref 8–27)
Bilirubin Total: 0.6 mg/dL (ref 0.0–1.2)
CO2: 25 mmol/L (ref 20–29)
Calcium: 11.2 mg/dL — ABNORMAL HIGH (ref 8.6–10.2)
Chloride: 105 mmol/L (ref 96–106)
Creatinine, Ser: 1.06 mg/dL (ref 0.76–1.27)
GFR calc Af Amer: 85 mL/min/{1.73_m2} (ref >59)
GFR calc non Af Amer: 74 mL/min/{1.73_m2} (ref >59)
Globulin, Total: 2.3 g/dL (ref 1.5–4.5)
Glucose: 82 mg/dL (ref 65–99)
Potassium: 5.1 mmol/L (ref 3.5–5.2)
Sodium: 143 mmol/L (ref 134–144)
Total Protein: 6.4 g/dL (ref 6.0–8.5)

## 2020-04-19 LAB — PSA: Prostate Specific Ag, Serum: 0.6 ng/mL (ref 0.0–4.0)

## 2020-04-30 DIAGNOSIS — M25512 Pain in left shoulder: Secondary | ICD-10-CM | POA: Diagnosis not present

## 2020-04-30 DIAGNOSIS — J449 Chronic obstructive pulmonary disease, unspecified: Secondary | ICD-10-CM | POA: Diagnosis not present

## 2020-04-30 DIAGNOSIS — J9621 Acute and chronic respiratory failure with hypoxia: Secondary | ICD-10-CM | POA: Diagnosis not present

## 2020-04-30 DIAGNOSIS — R918 Other nonspecific abnormal finding of lung field: Secondary | ICD-10-CM | POA: Diagnosis not present

## 2020-04-30 DIAGNOSIS — J441 Chronic obstructive pulmonary disease with (acute) exacerbation: Secondary | ICD-10-CM | POA: Diagnosis not present

## 2020-06-11 ENCOUNTER — Telehealth: Payer: Self-pay | Admitting: Internal Medicine

## 2020-06-11 NOTE — Telephone Encounter (Signed)
Left message for patient to call back and schedule Medicare Annual Wellness Visit (AWV) either virtually or in office. Whichever the patients preference is.  No history of AWV; please schedule at anytime with [facility]-Nurse Health Advisor.  This should be a 40 minute visit  AWV-I PER [facility] AS OF 06/30/2009 

## 2020-06-19 DIAGNOSIS — J449 Chronic obstructive pulmonary disease, unspecified: Secondary | ICD-10-CM | POA: Diagnosis not present

## 2020-06-19 DIAGNOSIS — J9621 Acute and chronic respiratory failure with hypoxia: Secondary | ICD-10-CM | POA: Diagnosis not present

## 2020-06-19 DIAGNOSIS — R918 Other nonspecific abnormal finding of lung field: Secondary | ICD-10-CM | POA: Diagnosis not present

## 2020-06-19 DIAGNOSIS — J441 Chronic obstructive pulmonary disease with (acute) exacerbation: Secondary | ICD-10-CM | POA: Diagnosis not present

## 2020-06-19 DIAGNOSIS — M25512 Pain in left shoulder: Secondary | ICD-10-CM | POA: Diagnosis not present

## 2020-07-09 ENCOUNTER — Telehealth: Payer: Self-pay

## 2020-07-09 ENCOUNTER — Other Ambulatory Visit: Payer: Self-pay

## 2020-07-09 ENCOUNTER — Encounter: Payer: Self-pay | Admitting: Emergency Medicine

## 2020-07-09 ENCOUNTER — Ambulatory Visit
Admission: EM | Admit: 2020-07-09 | Discharge: 2020-07-09 | Disposition: A | Discharge status: Home / Self Care | Payer: Medicare HMO | Attending: Family Medicine | Admitting: Family Medicine

## 2020-07-09 DIAGNOSIS — N419 Inflammatory disease of prostate, unspecified: Secondary | ICD-10-CM | POA: Diagnosis not present

## 2020-07-09 LAB — URINALYSIS, COMPLETE (UACMP) WITH MICROSCOPIC
Bilirubin Urine: NEGATIVE
Glucose, UA: NEGATIVE mg/dL
Ketones, ur: NEGATIVE mg/dL
Nitrite: NEGATIVE
Protein, ur: 30 mg/dL — AB
RBC / HPF: >50 RBC/hpf (ref 0–5)
Specific Gravity, Urine: 1.02 (ref 1.005–1.030)
pH: 6 (ref 5.0–8.0)

## 2020-07-09 MED ORDER — CIPROFLOXACIN HCL 500 MG PO TABS: 500.0000 mg | ORAL_TABLET | Freq: Two times a day (BID) | ORAL | 0 refills | Status: DC

## 2020-07-09 NOTE — Discharge Instructions (Addendum)
Take the Cipro twice daily for 14 days to treat your prostatitis.  Increase your oral water intake so that you increase your urine production and help to flush your urinary tract.  If you have any worsening of symptoms, you develop a fever, develop nausea or vomiting, or chills return for reevaluation or go to the ER.

## 2020-07-09 NOTE — ED Triage Notes (Signed)
Pt c/o urinary urgency, and dysuria. Started about 3 weeks ago.

## 2020-07-09 NOTE — ED Provider Notes (Signed)
[facility]    CSN: [id number] Arrival date & time: 07/09/20  1835      History   Chief Complaint Chief Complaint  Patient presents with  . Urinary Urgency    HPI David Rhodes is a 65 y.o. male.   HPI   65 year old male here for evaluation of urinary urgency and pain with urination that has been going on for the past 3 weeks. Patient reports that he has had some intermittent blood in his urine and also a decrease in the strength of his stream. Patient denies fever, abdominal pain, nausea or vomiting, low back pain, or cloudiness of his urine. Patient states that he hasn't had UTIs or prostatitis before.  Past Medical History:  Diagnosis Date  . Allergy    Hay fever  . Anxiety   . Asthma   . Chronic pain due to trauma   . COPD (chronic obstructive pulmonary disease) (HCC)   . Depression   . Essential tremor   . GERD (gastroesophageal reflux disease)   . Headache(784.0)    neck pain  . Hypertension   . 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 Inguinal hernia    bilateral  . Pneumonia    many years ago  . Shortness of breath    when allergies flare up  . Urine incontinence   . Wears glasses     Patient Active Problem List   Diagnosis Date Noted  . Dependence on supplemental oxygen 04/18/2020  . Leukocytosis 04/15/2019  . Inguinal hernia recurrent bilateral 11/16/2018  . Chronic left shoulder pain 10/05/2018  . PICC (peripherally inserted central catheter) in place 04/16/2018  . Essential tremor 10/18/2017  . Cervical radiculopathy 07/23/2017  . Bilateral temporomandibular joint pain 07/23/2017  . Neck pain 05/19/2017  . Medication monitoring encounter 09/11/2016  . Pulmonary nodules 12/31/2015  . COPD (chronic obstructive pulmonary disease) (HCC) 12/27/2015  . Chronic cough 12/27/2015  . Asthma 09/24/2015  . Allergic rhinitis 11/11/2014  . Nerve root pain 11/11/2014  . Dyskinesia 11/11/2014  . Acid reflux 11/11/2014  . Calcium  blood increased 11/11/2014  . Affective disorder (HCC) 11/11/2014  . Essential hypertension 12/25/2011  . Chronic back pain 12/25/2011    Past Surgical History:  Procedure Laterality Date  . DEEP BRAIN STIMULATOR PLACEMENT    . INGUINAL HERNIA REPAIR Bilateral 02/07/2014   Procedure:  BILATERAL INGUINAL HERNIA REPAIR;  Surgeon: Axel Filler, MD;  Location: [facility] OR;  Service: General;  Laterality: Bilateral;  . INSERTION OF MESH Bilateral 02/07/2014   Procedure: INSERTION OF MESH;  Surgeon: Axel Filler, MD;  Location: [facility] OR;  Service: General;  Laterality: Bilateral;  . NASAL SINUS SURGERY    . PULSE GENERATOR IMPLANT Left 09/11/2016   Procedure: Left chest-Change implantable pulse generator battery;  Surgeon: Maeola Harman, MD;  Location: [facility] OR;  Service: Neurosurgery;  Laterality: Left;  Left chest-Change implantable pulse generator battery  . PULSE GENERATOR IMPLANT Left 04/06/2018   Procedure: Revision of left chest implantable pulse generator, extension, and pocket adapter;  Surgeon: Maeola Harman, MD;  Location: [facility] OR;  Service: Neurosurgery;  Laterality: Left;  . SUBTHALAMIC STIMULATOR BATTERY REPLACEMENT  12/09/2011   Procedure: SUBTHALAMIC STIMULATOR BATTERY REPLACEMENT;  Surgeon: Maeola Harman, MD;  Location: [facility];  Service: Neurosurgery;  Laterality: N/A;   deep brain stimulator, implantable pulse generator change  . SUBTHALAMIC STIMULATOR BATTERY REPLACEMENT Left 04/21/2014   Procedure: Deep brain stimulator battery change;  Surgeon: Maeola Harman, MD;  Location:  [facility];  Service: Neurosurgery;  Laterality: Left;  Deep brain stimulator battery change  . SUBTHALAMIC STIMULATOR BATTERY REPLACEMENT N/A 08/10/2018   Procedure: Replacement of deep brain stimulator pulse generator/left abdomen with long lead extensions;  Surgeon: Maeola Harman, MD;  Location: [facility] OR;  Service: Neurosurgery;  Laterality: N/A;       Home Medications    Prior to Admission medications    Medication Sig Start Date End Date Taking? Authorizing Provider  baclofen (LIORESAL) 10 MG tablet Take 1 tablet (10 mg total) by mouth 3 (three) times daily. 04/18/20  Yes Reubin Milan, MD  budesonide (PULMICORT) 0.5 MG/2ML nebulizer solution Inhale into the lungs. 07/25/19 07/24/20 Yes [provider]  ciprofloxacin (CIPRO) 500 MG tablet Take 1 tablet (500 mg total) by mouth 2 (two) times daily. 07/09/20  Yes Becky Augusta, NP  fluticasone furoate-vilanterol (BREO ELLIPTA) 100-25 MCG/INH AEPB Inhale 1 puff into the lungs daily. 04/18/20  Yes Reubin Milan, MD  Fluticasone-Umeclidin-Vilant (TRELEGY ELLIPTA) 200-62.5-25 MCG/INH AEPB Inhale 1 puff into the lungs daily. Rinse mouth well after use 06/30/19  Yes Salena Saner, MD  gabapentin (NEURONTIN) 600 MG tablet Take 1 tablet (600 mg total) by mouth in the morning, at noon, in the evening, and at bedtime. 04/18/20  Yes Reubin Milan, MD  ipratropium-albuterol (DUONEB) 0.5-2.5 (3) MG/3ML SOLN Inhale into the lungs. 07/25/19 07/24/20 Yes [provider]  lisinopril (ZESTRIL) 40 MG tablet Take by mouth.   Yes [provider]  loratadine (CLARITIN) 10 MG tablet Take 1 tablet (10 mg total) by mouth daily. 10/13/17  Yes Enedina Finner, MD  meloxicam (MOBIC) 15 MG tablet TAKE 1 TABLET(15 MG) BY MOUTH DAILY Patient taking differently: Take 15 mg by mouth daily. 12/06/18  Yes Reubin Milan, MD  montelukast (SINGULAIR) 10 MG tablet TAKE 1 TABLET(10 MG) BY MOUTH AT BEDTIME Patient taking differently: Take 10 mg by mouth at bedtime. 05/08/19  Yes Reubin Milan, MD  omeprazole (PRILOSEC) 20 MG capsule TAKE 1 CAPSULE BY MOUTH DAILY Patient taking differently: Take 20 mg by mouth daily. 12/05/18  Yes Reubin Milan, MD  OXYGEN Inhale into the lungs. 3 L of oxygen   Yes [provider]  rOPINIRole (REQUIP) 2 MG tablet TAKE 1 TABLET BY MOUTH TWICE DAILY 04/01/20  Yes Reubin Milan, MD  albuterol (VENTOLIN HFA)  108 (90 Base) MCG/ACT inhaler Inhale 2 puffs into the lungs every 4 (four) hours as needed for wheezing or shortness of breath. 04/18/20   Reubin Milan, MD    Family History Family History  Problem Relation Age of Onset  . Myoclonus Mother   . Breast cancer Mother   . Prostate cancer Father   . Heart disease Paternal Grandfather   . Myoclonus Maternal Uncle     Social History Social History   Tobacco Use  . Smoking status: Never Smoker  . Smokeless tobacco: Current User    Types: Chew  Vaping Use  . Vaping Use: Never used  Substance Use Topics  . Alcohol use: No    Alcohol/week: 0.0 standard drinks  . Drug use: No     Allergies   Penicillins   Review of Systems Review of Systems  Constitutional: Negative for activity change, appetite change and fever.  Gastrointestinal: Negative for abdominal pain, diarrhea and nausea.  Genitourinary: Positive for difficulty urinating, dysuria, hematuria and urgency.  Musculoskeletal: Negative for back pain.  Skin: Negative for rash.  Hematological: Negative.  Psychiatric/Behavioral: Negative.      Physical Exam Triage Vital Signs ED Triage Vitals  Enc Vitals Group     BP 07/09/20 1954 (!) 165/111     Pulse Rate 07/09/20 1954 92     Resp 07/09/20 1954 18     Temp 07/09/20 1954 98.3 F (36.8 C)     Temp Source 07/09/20 1954 Oral     SpO2 07/09/20 1954 100 %     Weight 07/09/20 1952 151 lb 14.4 oz (68.9 kg)     Height 07/09/20 1952 6' (1.829 m)     Head Circumference --      Peak Flow --      Pain Score 07/09/20 1952 0     Pain Loc --      Pain Edu? --      Excl. in GC? --    No data found.  Updated Vital Signs BP (!) 165/111 (BP Location: Left Arm)   Pulse 92   Temp 98.3 F (36.8 C) (Oral)   Resp 18   Ht 6' (1.829 m)   Wt 151 lb 14.4 oz (68.9 kg)   SpO2 100%   BMI 20.60 kg/m   Visual Acuity Right Eye Distance:   Left Eye Distance:   Bilateral Distance:    Right Eye Near:   Left Eye Near:     Bilateral Near:     Physical Exam Vitals and nursing note reviewed.  Constitutional:      General: He is not in acute distress.    Appearance: Normal appearance. He is not toxic-appearing.  HENT:     Head: Normocephalic and atraumatic.  Cardiovascular:     Rate and Rhythm: Normal rate and regular rhythm.     Pulses: Normal pulses.     Heart sounds: Normal heart sounds. No murmur heard. No gallop.   Pulmonary:     Effort: Pulmonary effort is normal.     Breath sounds: Normal breath sounds. No rhonchi or rales.  Abdominal:     Tenderness: There is no right CVA tenderness or left CVA tenderness.  Skin:    General: Skin is warm and dry.     Capillary Refill: Capillary refill takes less than 2 seconds.     Findings: No erythema or rash.  Neurological:     General: No focal deficit present.     Mental Status: He is alert and oriented to person, place, and time.  Psychiatric:        Mood and Affect: Mood normal.        Behavior: Behavior normal.        Thought Content: Thought content normal.        Judgment: Judgment normal.      UC Treatments / Results  Labs (all labs ordered are listed, but only abnormal results are displayed) Labs Reviewed  URINALYSIS, COMPLETE (UACMP) WITH MICROSCOPIC - Abnormal; Notable for the following components:      Result Value   APPearance HAZY (*)    Hgb urine dipstick LARGE (*)    Protein, ur 30 (*)    Leukocytes,Ua TRACE (*)    Bacteria, UA FEW (*)    All other components within normal limits  URINE CULTURE    EKG   Radiology No results found.  Procedures Procedures (including critical care time)  Medications Ordered in UC Medications - No data to display  Initial Impression / Assessment and Plan / UC Course  I have reviewed the triage vital signs and  the nursing notes.  Pertinent labs & imaging results that were available during my care of the patient were reviewed by me and considered in my medical decision making (see  chart for details).   Patient is here for evaluation of 3 weeks worth of urinary urgency and pain with urination with intermittent blood in his urine. Patient states that he has had a decrease in the strength of his stream over the past 3 weeks. Patient denies low back pain or flulike symptoms. Rectal exam deferred by patient. Small volume of urine was able to be collected and sent for analysis.  Urinalysis shows large amount of hemoglobin, 30 protein, trace leukocytes, greater than 50 RBCs and few bacteria. WBCs 0-5 squamous epithelial 0-5. Will send urine for culture.  Patient symptoms are consistent with prostatitis. We'll treat with Cipro twice daily x14 days.   Final Clinical Impressions(s) / UC Diagnoses   Final diagnoses:  Prostatitis, unspecified prostatitis type     Discharge Instructions     Take the Cipro twice daily for 14 days to treat your prostatitis.  Increase your oral water intake so that you increase your urine production and help to flush your urinary tract.  If you have any worsening of symptoms, you develop a fever, develop nausea or vomiting, or chills return for reevaluation or go to the ER.    ED Prescriptions    Medication Sig Dispense Auth. Provider   ciprofloxacin (CIPRO) 500 MG tablet Take 1 tablet (500 mg total) by mouth 2 (two) times daily. 28 tablet Becky Augusta, NP     PDMP not reviewed this encounter.   Becky Augusta, NP 07/09/20 2046

## 2020-07-09 NOTE — Telephone Encounter (Unsigned)
Copied from CRM [id number]. Topic: Appointment Scheduling - Scheduling Inquiry for Clinic >> Jul 09, 2020  3:22 PM David Rhodes wrote: Reason for CRM: Pt scheduled an appt for a problem he is having when using the restroom / he stated he has burning and it feels like he going to bust a gut and then nothing comes out much/ pt asked for a sooner appt if one comes available / he doesn't think he can wait that long / please advise

## 2020-07-10 NOTE — Telephone Encounter (Signed)
Called and left VM telling patient if he cannot use the restroom then he needs to go to UC or ER for a catheter placement so he can have some relief and then they can determine what the issue is as to why he cannot urinate.

## 2020-07-11 ENCOUNTER — Telehealth: Payer: Self-pay | Admitting: Family Medicine

## 2020-07-11 ENCOUNTER — Ambulatory Visit: Payer: Medicare HMO | Admitting: Internal Medicine

## 2020-07-11 LAB — URINE CULTURE: Culture: <10000 — AB

## 2020-07-11 MED ORDER — PHENAZOPYRIDINE HCL 200 MG PO TABS: 200.0000 mg | ORAL_TABLET | Freq: Three times a day (TID) | ORAL | 0 refills | Status: DC | PRN

## 2020-07-11 NOTE — Telephone Encounter (Signed)
Patient requesting something for dysuria.  Rx sent for Pyridium.   Everlene Other DO [facility]

## 2020-07-13 ENCOUNTER — Emergency Department
Admission: EM | Admit: 2020-07-13 | Discharge: 2020-07-13 | Disposition: A | Discharge status: Home / Self Care | Payer: Medicare HMO | Attending: Emergency Medicine | Admitting: Emergency Medicine

## 2020-07-13 ENCOUNTER — Encounter: Payer: Self-pay | Admitting: Internal Medicine

## 2020-07-13 ENCOUNTER — Other Ambulatory Visit: Payer: Self-pay

## 2020-07-13 ENCOUNTER — Ambulatory Visit (INDEPENDENT_AMBULATORY_CARE_PROVIDER_SITE_OTHER): Payer: Medicare HMO | Admitting: Internal Medicine

## 2020-07-13 VITALS — BP 140/94 | HR 104 | Temp 97.3°F | Ht 72.0 in | Wt 160.0 lb

## 2020-07-13 DIAGNOSIS — J439 Emphysema, unspecified: Secondary | ICD-10-CM

## 2020-07-13 DIAGNOSIS — Z5321 Procedure and treatment not carried out due to patient leaving prior to being seen by health care provider: Secondary | ICD-10-CM | POA: Diagnosis not present

## 2020-07-13 DIAGNOSIS — R3915 Urgency of urination: Secondary | ICD-10-CM | POA: Diagnosis not present

## 2020-07-13 DIAGNOSIS — R339 Retention of urine, unspecified: Secondary | ICD-10-CM

## 2020-07-13 NOTE — Progress Notes (Signed)
Date:  07/13/2020   Name:  David Rhodes   DOB:  [birth year]   MRN:  [id number]   Chief Complaint: Hematuria (X5 days,Painful to urinate, urgency to pee and not being able to use the bathroom on time using it in diaper ) Patient seen in UC 4 days ago.  Had hematuria, urine culture negative.  He was treated with Cipro which he has not completed.  Urinary Frequency  This is a new problem. The current episode started 1 to 4 weeks ago. The problem occurs every urination. The patient is experiencing no pain. There has been no fever. Associated symptoms include frequency and hematuria. Pertinent negatives include no chills. He has tried antibiotics for the symptoms. The treatment provided no relief. There is no history of kidney stones or recurrent UTIs.  He is only able to urinate a few drop with effort.  However, later he will urinate spontaneously and is having to wear a depends.  He still sees blood.  No fever or chills.  Taking the Cipro.  Also using AZO without improvement in penile discomfort.   Lab Results  Component Value Date   CREATININE 1.06 04/18/2020   BUN 7 (L) 04/18/2020   NA 143 04/18/2020   K 5.1 04/18/2020   CL 105 04/18/2020   CO2 25 04/18/2020   Lab Results  Component Value Date   CHOL 154 04/18/2020   HDL 62 04/18/2020   LDLCALC 76 04/18/2020   TRIG 85 04/18/2020   CHOLHDL 2.5 04/18/2020   Lab Results  Component Value Date   TSH 1.522 12/25/2018   Lab Results  Component Value Date   HGBA1C 5.3 12/25/2018   Lab Results  Component Value Date   WBC 10.8 04/18/2020   HGB 15.8 04/18/2020   HCT 48.4 04/18/2020   MCV 89 04/18/2020   PLT 217 04/18/2020   Lab Results  Component Value Date   ALT 12 04/18/2020   AST 13 04/18/2020   ALKPHOS 111 04/18/2020   BILITOT 0.6 04/18/2020     Review of Systems  Constitutional: Negative for chills, fatigue and fever.  Respiratory: Positive for shortness of breath. Negative for chest tightness.   Cardiovascular:  Negative for chest pain.  Genitourinary: Positive for frequency and hematuria.  Neurological: Positive for tremors. Negative for dizziness.    Patient Active Problem List   Diagnosis Date Noted  . Dependence on supplemental oxygen 04/18/2020  . Leukocytosis 04/15/2019  . Inguinal hernia recurrent bilateral 11/16/2018  . Chronic left shoulder pain 10/05/2018  . PICC (peripherally inserted central catheter) in place 04/16/2018  . Essential tremor 10/18/2017  . Cervical radiculopathy 07/23/2017  . Bilateral temporomandibular joint pain 07/23/2017  . Neck pain 05/19/2017  . Medication monitoring encounter 09/11/2016  . Pulmonary nodules 12/31/2015  . COPD (chronic obstructive pulmonary disease) (HCC) 12/27/2015  . Chronic cough 12/27/2015  . Asthma 09/24/2015  . Allergic rhinitis 11/11/2014  . Nerve root pain 11/11/2014  . Dyskinesia 11/11/2014  . Acid reflux 11/11/2014  . Calcium blood increased 11/11/2014  . Affective disorder (HCC) 11/11/2014  . Essential hypertension 12/25/2011  . Chronic back pain 12/25/2011    Allergies  Allergen Reactions  . Penicillins Shortness Of Breath    PATIENT HAS HAD A PCN REACTION WITH IMMEDIATE RASH, FACIAL/TONGUE/THROAT SWELLING, SOB, OR LIGHTHEADEDNESS WITH HYPOTENSION:  #  #  YES  #  #  Has patient had a PCN reaction causing severe rash involving mucus membranes or skin necrosis: No Has patient had  a PCN reaction that required hospitalization No Has patient had a PCN reaction occurring within the last 10 years: No If all of the above answers are "NO", then may proceed with Cephalosporin use. **tolerated cefazolin and cephalexin 03/2017    Past Surgical History:  Procedure Laterality Date  . DEEP BRAIN STIMULATOR PLACEMENT    . INGUINAL HERNIA REPAIR Bilateral 02/07/2014   Procedure:  BILATERAL INGUINAL HERNIA REPAIR;  Surgeon: Axel Filler, MD;  Location: [facility] OR;  Service: General;  Laterality: Bilateral;  . INSERTION OF MESH Bilateral  02/07/2014   Procedure: INSERTION OF MESH;  Surgeon: Axel Filler, MD;  Location: [facility] OR;  Service: General;  Laterality: Bilateral;  . NASAL SINUS SURGERY    . PULSE GENERATOR IMPLANT Left 09/11/2016   Procedure: Left chest-Change implantable pulse generator battery;  Surgeon: Maeola Harman, MD;  Location: [facility] OR;  Service: Neurosurgery;  Laterality: Left;  Left chest-Change implantable pulse generator battery  . PULSE GENERATOR IMPLANT Left 04/06/2018   Procedure: Revision of left chest implantable pulse generator, extension, and pocket adapter;  Surgeon: Maeola Harman, MD;  Location: [facility] OR;  Service: Neurosurgery;  Laterality: Left;  . SUBTHALAMIC STIMULATOR BATTERY REPLACEMENT  12/09/2011   Procedure: SUBTHALAMIC STIMULATOR BATTERY REPLACEMENT;  Surgeon: Maeola Harman, MD;  Location: [facility];  Service: Neurosurgery;  Laterality: N/A;   deep brain stimulator, implantable pulse generator change  . SUBTHALAMIC STIMULATOR BATTERY REPLACEMENT Left 04/21/2014   Procedure: Deep brain stimulator battery change;  Surgeon: Maeola Harman, MD;  Location: [facility];  Service: Neurosurgery;  Laterality: Left;  Deep brain stimulator battery change  . SUBTHALAMIC STIMULATOR BATTERY REPLACEMENT N/A 08/10/2018   Procedure: Replacement of deep brain stimulator pulse generator/left abdomen with long lead extensions;  Surgeon: Maeola Harman, MD;  Location: [facility] OR;  Service: Neurosurgery;  Laterality: N/A;    Social History   Tobacco Use  . Smoking status: Never Smoker  . Smokeless tobacco: Current User    Types: Chew  Vaping Use  . Vaping Use: Never used  Substance Use Topics  . Alcohol use: No    Alcohol/week: 0.0 standard drinks  . Drug use: No     Medication list has been reviewed and updated.  Current Meds  Medication Sig  . albuterol (VENTOLIN HFA) 108 (90 Base) MCG/ACT inhaler Inhale 2 puffs into the lungs every 4 (four) hours as needed for wheezing or shortness of breath.  . baclofen  (LIORESAL) 10 MG tablet Take 1 tablet (10 mg total) by mouth 3 (three) times daily.  . budesonide (PULMICORT) 0.5 MG/2ML nebulizer solution Inhale into the lungs.  . ciprofloxacin (CIPRO) 500 MG tablet Take 1 tablet (500 mg total) by mouth 2 (two) times daily.  . fluticasone furoate-vilanterol (BREO ELLIPTA) 100-25 MCG/INH AEPB Inhale 1 puff into the lungs daily.  . Fluticasone-Umeclidin-Vilant (TRELEGY ELLIPTA) 200-62.5-25 MCG/INH AEPB Inhale 1 puff into the lungs daily. Rinse mouth well after use  . gabapentin (NEURONTIN) 600 MG tablet Take 1 tablet (600 mg total) by mouth in the morning, at noon, in the evening, and at bedtime.  Marland Kitchen ipratropium-albuterol (DUONEB) 0.5-2.5 (3) MG/3ML SOLN Inhale into the lungs.  Marland Kitchen lisinopril (ZESTRIL) 40 MG tablet Take by mouth.  . loratadine (CLARITIN) 10 MG tablet Take 1 tablet (10 mg total) by mouth daily.  . meloxicam (MOBIC) 15 MG tablet TAKE 1 TABLET(15 MG) BY MOUTH DAILY (Patient taking differently: Take 15 mg by mouth daily.)  . montelukast (SINGULAIR) 10 MG tablet TAKE 1 TABLET(10 MG) BY  MOUTH AT BEDTIME (Patient taking differently: Take 10 mg by mouth at bedtime.)  . omeprazole (PRILOSEC) 20 MG capsule TAKE 1 CAPSULE BY MOUTH DAILY (Patient taking differently: Take 20 mg by mouth daily.)  . OXYGEN Inhale into the lungs. 3 L of oxygen  . phenazopyridine (PYRIDIUM) 200 MG tablet Take 1 tablet (200 mg total) by mouth 3 (three) times daily as needed (Pain with urination).  Marland Kitchen rOPINIRole (REQUIP) 2 MG tablet TAKE 1 TABLET BY MOUTH TWICE DAILY    PHQ 2/9 Scores 07/13/2020 04/18/2020 05/23/2019 04/14/2019  PHQ - 2 Score 2 4 3  0  PHQ- 9 Score 4 8 10  -    GAD 7 : Generalized Anxiety Score 07/13/2020 04/18/2020  Nervous, Anxious, on Edge 0 1  Control/stop worrying 1 1  Worry too much - different things 1 1  Trouble relaxing 0 0  Restless 0 0  Easily annoyed or irritable 0 1  Afraid - awful might happen 0 0  Total GAD 7 Score 2 4  Anxiety Difficulty - Not  difficult at all    BP Readings from Last 3 Encounters:  07/13/20 (!) 140/94  07/09/20 (!) 165/111  04/18/20 140/82    Physical Exam Constitutional:      General: He is not in acute distress.    Appearance: Normal appearance.  Cardiovascular:     Rate and Rhythm: Normal rate and regular rhythm.     Pulses: Normal pulses.  Pulmonary:     Effort: Pulmonary effort is normal.     Breath sounds: Decreased breath sounds present. No wheezing or rhonchi.     Comments: O2 [place] Abdominal:     General: Abdomen is flat. Bowel sounds are normal.     Palpations: Abdomen is soft.     Tenderness: There is no abdominal tenderness.     Hernia: A hernia is present. Hernia is present in the right inguinal area.     Comments: Bladder is not distended.  Musculoskeletal:     Cervical back: Normal range of motion.  Neurological:     Mental Status: He is alert.  Psychiatric:        Attention and Perception: Attention normal.        Mood and Affect: Mood normal.     Wt Readings from Last 3 Encounters:  07/13/20 160 lb (72.6 kg)  07/09/20 151 lb 14.4 oz (68.9 kg)  04/18/20 152 lb (68.9 kg)    BP (!) 140/94   Pulse (!) 104   Temp (!) 97.3 F (36.3 C) (Oral)   Ht 6' (1.829 m)   Wt 160 lb (72.6 kg)   SpO2 99%   BMI 21.70 kg/m   Assessment and Plan: 1. Urgency of urination Continue Cipro for presumed prostatitis - Ambulatory referral to Urology  2. Urinary retention - Ambulatory referral to Urology  3. Pulmonary emphysema, unspecified emphysema type (HCC) Now on O2 [place] continuously Followed by Pulmonary    Partially dictated using 09/06/20. Any errors are unintentional.  04/20/20, MD [facility]  07/13/2020

## 2020-07-13 NOTE — ED Triage Notes (Signed)
Pt was recently dx with prostatitis 1/10 and started on antibiotics. States here for continued burning on urination and incontinence.

## 2020-07-13 NOTE — Patient Instructions (Signed)
Finish antibiotics  Take Ibuprofen twice a day - 400 mg

## 2020-07-14 ENCOUNTER — Encounter: Payer: Self-pay | Admitting: Emergency Medicine

## 2020-07-14 DIAGNOSIS — N410 Acute prostatitis: Secondary | ICD-10-CM | POA: Insufficient documentation

## 2020-07-14 DIAGNOSIS — R350 Frequency of micturition: Secondary | ICD-10-CM | POA: Insufficient documentation

## 2020-07-14 DIAGNOSIS — Z5321 Procedure and treatment not carried out due to patient leaving prior to being seen by health care provider: Secondary | ICD-10-CM | POA: Insufficient documentation

## 2020-07-14 LAB — LIPASE, BLOOD: Lipase: 27 U/L (ref 11–51)

## 2020-07-14 LAB — CBC
HCT: 44.3 % (ref 39.0–52.0)
Hemoglobin: 14.8 g/dL (ref 13.0–17.0)
MCH: 29.6 pg (ref 26.0–34.0)
MCHC: 33.4 g/dL (ref 30.0–36.0)
MCV: 88.6 fL (ref 80.0–100.0)
Platelets: 315 10*3/uL (ref 150–400)
RBC: 5 MIL/uL (ref 4.22–5.81)
RDW: 12.7 % (ref 11.5–15.5)
WBC: 12 10*3/uL — ABNORMAL HIGH (ref 4.0–10.5)
nRBC: 0 % (ref 0.0–0.2)

## 2020-07-14 LAB — COMPREHENSIVE METABOLIC PANEL
ALT: 13 U/L (ref 0–44)
AST: 16 U/L (ref 15–41)
Albumin: 3.5 g/dL (ref 3.5–5.0)
Alkaline Phosphatase: 65 U/L (ref 38–126)
Anion gap: 8 (ref 5–15)
BUN: 12 mg/dL (ref 8–23)
CO2: 26 mmol/L (ref 22–32)
Calcium: 10.3 mg/dL (ref 8.9–10.3)
Chloride: 109 mmol/L (ref 98–111)
Creatinine, Ser: 0.85 mg/dL (ref 0.61–1.24)
GFR, Estimated: >60 mL/min (ref >60)
Glucose, Bld: 88 mg/dL (ref 70–99)
Potassium: 3.9 mmol/L (ref 3.5–5.1)
Sodium: 143 mmol/L (ref 135–145)
Total Bilirubin: 0.5 mg/dL (ref 0.3–1.2)
Total Protein: 6.5 g/dL (ref 6.5–8.1)

## 2020-07-14 NOTE — ED Triage Notes (Signed)
Pt reports he was diagnosed with prostatitis on 1/10, started on antibiotics. Pt to ED tonight due to continued incontinence and urinary frequency.

## 2020-07-15 ENCOUNTER — Emergency Department
Admission: EM | Admit: 2020-07-15 | Discharge: 2020-07-15 | Disposition: A | Discharge status: Home / Self Care | Payer: Medicare HMO | Attending: Emergency Medicine | Admitting: Emergency Medicine

## 2020-07-15 DIAGNOSIS — J449 Chronic obstructive pulmonary disease, unspecified: Secondary | ICD-10-CM | POA: Diagnosis not present

## 2020-07-15 DIAGNOSIS — Z7951 Long term (current) use of inhaled steroids: Secondary | ICD-10-CM | POA: Diagnosis not present

## 2020-07-15 DIAGNOSIS — R32 Unspecified urinary incontinence: Secondary | ICD-10-CM | POA: Diagnosis not present

## 2020-07-15 DIAGNOSIS — Z79899 Other long term (current) drug therapy: Secondary | ICD-10-CM | POA: Diagnosis not present

## 2020-07-15 DIAGNOSIS — Z880 Allergy status to penicillin: Secondary | ICD-10-CM | POA: Diagnosis not present

## 2020-07-15 DIAGNOSIS — N369 Urethral disorder, unspecified: Secondary | ICD-10-CM | POA: Diagnosis not present

## 2020-07-15 DIAGNOSIS — R300 Dysuria: Secondary | ICD-10-CM | POA: Diagnosis not present

## 2020-07-15 DIAGNOSIS — G250 Essential tremor: Secondary | ICD-10-CM | POA: Diagnosis not present

## 2020-07-15 NOTE — ED Notes (Signed)
Patient up to stat desk asking how much longer it would be.  Explain process to patient.  Patient asked if he was going to see a specialist.  Explained to patient that he would have to see an ED provider first and they would decide.  Patient states he might leave.

## 2020-07-16 DIAGNOSIS — R32 Unspecified urinary incontinence: Secondary | ICD-10-CM | POA: Diagnosis not present

## 2020-07-16 DIAGNOSIS — J449 Chronic obstructive pulmonary disease, unspecified: Secondary | ICD-10-CM | POA: Diagnosis not present

## 2020-07-16 DIAGNOSIS — N211 Calculus in urethra: Secondary | ICD-10-CM | POA: Diagnosis not present

## 2020-07-16 DIAGNOSIS — Z7951 Long term (current) use of inhaled steroids: Secondary | ICD-10-CM | POA: Diagnosis not present

## 2020-07-16 DIAGNOSIS — Z880 Allergy status to penicillin: Secondary | ICD-10-CM | POA: Diagnosis not present

## 2020-07-16 DIAGNOSIS — T194XXA Foreign body in penis, initial encounter: Secondary | ICD-10-CM | POA: Diagnosis not present

## 2020-07-16 DIAGNOSIS — R300 Dysuria: Secondary | ICD-10-CM | POA: Diagnosis not present

## 2020-07-16 DIAGNOSIS — N390 Urinary tract infection, site not specified: Secondary | ICD-10-CM | POA: Diagnosis not present

## 2020-07-16 DIAGNOSIS — G250 Essential tremor: Secondary | ICD-10-CM | POA: Diagnosis not present

## 2020-07-17 ENCOUNTER — Other Ambulatory Visit: Payer: Self-pay

## 2020-07-17 ENCOUNTER — Ambulatory Visit (INDEPENDENT_AMBULATORY_CARE_PROVIDER_SITE_OTHER): Payer: Medicare HMO | Admitting: Urology

## 2020-07-17 ENCOUNTER — Encounter: Payer: Self-pay | Admitting: Urology

## 2020-07-17 VITALS — BP 116/70 | HR 106 | Ht 72.0 in | Wt 150.0 lb

## 2020-07-17 DIAGNOSIS — R3915 Urgency of urination: Secondary | ICD-10-CM

## 2020-07-17 DIAGNOSIS — N211 Calculus in urethra: Secondary | ICD-10-CM

## 2020-07-17 DIAGNOSIS — Z87898 Personal history of other specified conditions: Secondary | ICD-10-CM | POA: Diagnosis not present

## 2020-07-17 LAB — BLADDER SCAN AMB NON-IMAGING

## 2020-07-17 NOTE — Patient Instructions (Signed)
Cystoscopy Cystoscopy is a procedure that is used to help diagnose and sometimes treat conditions that affect the lower urinary tract. The lower urinary tract includes the bladder and the urethra. The urethra is the tube that drains urine from the bladder. Cystoscopy is done using a thin, tube-shaped instrument with a light and camera at the end (cystoscope). The cystoscope may be hard or flexible, depending on the goal of the procedure. The cystoscope is inserted through the urethra, into the bladder. Cystoscopy may be recommended if you have:  Urinary tract infections that keep coming back.  Blood in the urine (hematuria).  An inability to control when you urinate (urinary incontinence) or an overactive bladder.  Unusual cells found in a urine sample.  A blockage in the urethra, such as a urinary stone.  Painful urination.  An abnormality in the bladder found during an intravenous pyelogram (IVP) or CT scan. Cystoscopy may also be done to remove a sample of tissue to be examined under a microscope (biopsy). What are the risks? Generally, this is a safe procedure. However, problems may occur, including:  Infection.  Bleeding.  What happens during the procedure?  1. You will be given one or more of the following: ? A medicine to numb the area (local anesthetic). 2. The area around the opening of your urethra will be cleaned. 3. The cystoscope will be passed through your urethra into your bladder. 4. Germ-free (sterile) fluid will flow through the cystoscope to fill your bladder. The fluid will stretch your bladder so that your health care provider can clearly examine your bladder walls. 5. Your doctor will look at the urethra and bladder. 6. The cystoscope will be removed The procedure may vary among health care providers  What can I expect after the procedure? After the procedure, it is common to have: 1. Some soreness or pain in your abdomen and urethra. 2. Urinary symptoms.  These include: ? Mild pain or burning when you urinate. Pain should stop within a few minutes after you urinate. This may last for up to 1 week. ? A small amount of blood in your urine for several days. ? Feeling like you need to urinate but producing only a small amount of urine. Follow these instructions at home: General instructions  Return to your normal activities as told by your health care provider.   Do not drive for 24 hours if you were given a sedative during your procedure.  Watch for any blood in your urine. If the amount of blood in your urine increases, call your health care provider.  If a tissue sample was removed for testing (biopsy) during your procedure, it is up to you to get your test results. Ask your health care provider, or the department that is doing the test, when your results will be ready.  Drink enough fluid to keep your urine pale yellow.  Keep all follow-up visits as told by your health care provider. This is important. Contact a health care provider if you:  Have pain that gets worse or does not get better with medicine, especially pain when you urinate.  Have trouble urinating.  Have more blood in your urine. Get help right away if you:  Have blood clots in your urine.  Have abdominal pain.  Have a fever or chills.  Are unable to urinate. Summary  Cystoscopy is a procedure that is used to help diagnose and sometimes treat conditions that affect the lower urinary tract.  Cystoscopy is done using   a thin, tube-shaped instrument with a light and camera at the end.  After the procedure, it is common to have some soreness or pain in your abdomen and urethra.  Watch for any blood in your urine. If the amount of blood in your urine increases, call your health care provider.  If you were prescribed an antibiotic medicine, take it as told by your health care provider. Do not stop taking the antibiotic even if you start to feel better. This  information is not intended to replace advice given to you by your health care provider. Make sure you discuss any questions you have with your health care provider. Document Revised: 06/08/2018 Document Reviewed: 06/08/2018 Elsevier Patient Education  2020 Elsevier Inc.   

## 2020-07-17 NOTE — Progress Notes (Signed)
07/17/20 1:46 PM   David Rhodes October 17, 1955 [id number]  CC: Urethral stone, urinary symptoms  HPI: I saw David Rhodes today for evaluation of urinary symptoms and recent urethral stone.  He is a comorbid 65 year old male with COPD on oxygen who reports male with COPD on oxygen who reports 1 month of urinary urgency and urge incontinence, and 1 week of severe pain with urination and difficulty urinating.  Urine culture from 07/15/2020 grew 10-50k E. coli, but sensitivities were not performed.  He was seen at urgent care previously and treated for possible prostatitis with Cipro.  His symptoms worsened and he presented to the ER at [facility] last night, and the urology resident reportedly removed a stone at the meatus with graspers.  It does not sound like cystoscopy was performed.  The patient reports complete resolution of his urinary symptoms since stone removal last night.  He denies any prior kidney stones or flank pain.  He denies any prior UTIs.  Bladder scan today normal at 0 mL.  PSA normal at 0.6 04/18/2020  PMH: Past Medical History:  Diagnosis Date  . Allergy    Hay fever  . Anxiety   . Asthma   . Chronic pain due to trauma   . COPD (chronic obstructive pulmonary disease) (HCC)   . Depression   . Essential tremor   . GERD (gastroesophageal reflux disease)   . Headache(784.0)    neck pain  . Hypertension   . 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 Inguinal hernia    bilateral  . Pneumonia    many years ago  . Shortness of breath    when allergies flare up  . Urine incontinence   . Wears glasses     Surgical History: Past Surgical History:  Procedure Laterality Date  . DEEP BRAIN STIMULATOR PLACEMENT    . INGUINAL HERNIA REPAIR Bilateral 02/07/2014   Procedure:  BILATERAL INGUINAL HERNIA REPAIR;  Surgeon: Axel Filler, MD;  Location: [facility] OR;  Service: General;  Laterality: Bilateral;  . INSERTION OF MESH Bilateral 02/07/2014   Procedure: INSERTION OF MESH;  Surgeon: Axel Filler, MD;   Location: [facility] OR;  Service: General;  Laterality: Bilateral;  . NASAL SINUS SURGERY    . PULSE GENERATOR IMPLANT Left 09/11/2016   Procedure: Left chest-Change implantable pulse generator battery;  Surgeon: Maeola Harman, MD;  Location: [facility] OR;  Service: Neurosurgery;  Laterality: Left;  Left chest-Change implantable pulse generator battery  . PULSE GENERATOR IMPLANT Left 04/06/2018   Procedure: Revision of left chest implantable pulse generator, extension, and pocket adapter;  Surgeon: Maeola Harman, MD;  Location: [facility] OR;  Service: Neurosurgery;  Laterality: Left;  . SUBTHALAMIC STIMULATOR BATTERY REPLACEMENT  12/09/2011   Procedure: SUBTHALAMIC STIMULATOR BATTERY REPLACEMENT;  Surgeon: Maeola Harman, MD;  Location: [facility] NEURO ORS;  Service: Neurosurgery;  Laterality: N/A;   deep brain stimulator, implantable pulse generator change  . SUBTHALAMIC STIMULATOR BATTERY REPLACEMENT Left 04/21/2014   Procedure: Deep brain stimulator battery change;  Surgeon: Maeola Harman, MD;  Location: [facility] NEURO ORS;  Service: Neurosurgery;  Laterality: Left;  Deep brain stimulator battery change  . SUBTHALAMIC STIMULATOR BATTERY REPLACEMENT N/A 08/10/2018   Procedure: Replacement of deep brain stimulator pulse generator/left abdomen with long lead extensions;  Surgeon: Maeola Harman, MD;  Location: [facility] OR;  Service: Neurosurgery;  Laterality: N/A;    Family History: Family History  Problem Relation Age of Onset  . Myoclonus Mother   . Breast cancer Mother   . Prostate cancer Father   .  Heart disease Paternal Grandfather   . Myoclonus Maternal Uncle     Social History:  reports that he has never smoked. His smokeless tobacco use includes chew. He reports that he does not drink alcohol and does not use drugs.  Physical Exam: BP 116/70 (BP Location: Left Arm, Patient Position: Sitting, Cuff Size: Normal)   Pulse (!) 106   Ht 6' (1.829 m)   Wt 150 lb (68 kg)   BMI 20.34 kg/m    Constitutional: Frail-appearing, on  oxygen Cardiovascular: No clubbing, cyanosis, or edema. Respiratory: Normal respiratory effort, no increased work of breathing. GI: Abdomen is soft, nontender, nondistended, no abdominal masses GU: Circumcised phallus with meatal stenosis and erythema around the opening, there is some firmness at the ventral aspect of the glans, unclear if inflammation from recently removed stone or persistent stone fragment.  Unable to express any foreign body or stone.  Moderately tender.  Laboratory Data: Reviewed, see HPI  Pertinent Imaging: None to review  Assessment & Plan:   65 year old male with 1 month of urinary urgency and 1 week of severe pain with urination likely secondary to a urethral stone.  This was removed last night at the ER at [facility].  He is emptying his bladder well today with a PVR of 0 mL, but has some persistent tenderness at the glans, and unclear if this represents resolving inflammation or a persistent small stone fragment.  With his complete resolution of symptoms after stone removal last night, I think it is reasonable to see how he does over the next week before pursuing cystoscopy.  I recommended completing the course of Cipro for his recent E. coli UTI, and taking Pyridium as needed for any dysuria.  I recommended close follow-up in 1 week for repeat urinalysis to evaluate resolution of microscopic hematuria and likely cystoscopy to rule out any other urethral stricture or stones.  We will also perform KUB at that time to evaluate for any other stone disease.  RTC 1 week for KUB, exam, urinalysis, and likely cystoscopy  Legrand Rams, MD 07/17/2020  [facility] [address] [place], [place] [address] [phone]

## 2020-07-20 DIAGNOSIS — J9621 Acute and chronic respiratory failure with hypoxia: Secondary | ICD-10-CM | POA: Diagnosis not present

## 2020-07-20 DIAGNOSIS — J449 Chronic obstructive pulmonary disease, unspecified: Secondary | ICD-10-CM | POA: Diagnosis not present

## 2020-07-20 DIAGNOSIS — J441 Chronic obstructive pulmonary disease with (acute) exacerbation: Secondary | ICD-10-CM | POA: Diagnosis not present

## 2020-07-20 DIAGNOSIS — R918 Other nonspecific abnormal finding of lung field: Secondary | ICD-10-CM | POA: Diagnosis not present

## 2020-07-20 DIAGNOSIS — M25512 Pain in left shoulder: Secondary | ICD-10-CM | POA: Diagnosis not present

## 2020-07-26 ENCOUNTER — Encounter: Payer: Self-pay | Admitting: Urology

## 2020-07-26 ENCOUNTER — Ambulatory Visit
Admission: RE | Admit: 2020-07-26 | Discharge: 2020-07-26 | Disposition: A | Discharge status: Home / Self Care | Payer: Medicare HMO | Source: Ambulatory Visit | Attending: Urology | Admitting: Urology

## 2020-07-26 ENCOUNTER — Other Ambulatory Visit: Payer: Self-pay

## 2020-07-26 ENCOUNTER — Ambulatory Visit (INDEPENDENT_AMBULATORY_CARE_PROVIDER_SITE_OTHER): Payer: Medicare HMO | Admitting: Urology

## 2020-07-26 VITALS — BP 135/89 | HR 97 | Ht 72.0 in | Wt 150.0 lb

## 2020-07-26 DIAGNOSIS — Z87898 Personal history of other specified conditions: Secondary | ICD-10-CM

## 2020-07-26 DIAGNOSIS — N211 Calculus in urethra: Secondary | ICD-10-CM | POA: Insufficient documentation

## 2020-07-26 DIAGNOSIS — Z0389 Encounter for observation for other suspected diseases and conditions ruled out: Secondary | ICD-10-CM | POA: Diagnosis not present

## 2020-07-26 DIAGNOSIS — R3915 Urgency of urination: Secondary | ICD-10-CM | POA: Diagnosis not present

## 2020-07-26 LAB — URINALYSIS, COMPLETE
Bilirubin, UA: NEGATIVE
Glucose, UA: NEGATIVE
Ketones, UA: NEGATIVE
Leukocytes,UA: NEGATIVE
Nitrite, UA: NEGATIVE
Protein,UA: NEGATIVE
RBC, UA: NEGATIVE
Specific Gravity, UA: 1.025 (ref 1.005–1.030)
Urobilinogen, Ur: 0.2 mg/dL (ref 0.2–1.0)
pH, UA: 5.5 (ref 5.0–7.5)

## 2020-07-26 LAB — MICROSCOPIC EXAMINATION
Bacteria, UA: NONE SEEN
Epithelial Cells (non renal): NONE SEEN /hpf (ref 0–10)
RBC, Urine: NONE SEEN /hpf (ref 0–2)

## 2020-07-26 NOTE — Progress Notes (Signed)
Patient with recent urethral stone removal at ER at [facility] on 07/16/2020.  Here today for KUB and possible cystoscopy.  He reports his urinary symptoms have completely resolved and he denies any dysuria or gross hematuria.  Urinalysis today is completely benign with no WBCs or microscopic hematuria.  He refused cystoscopy today which I think is reasonable with his lack of symptoms and benign urinalysis.  He is amenable to a KUB today to confirm no residual urethral or bladder stones.  We will call with those results.  Legrand Rams, MD 07/26/2020

## 2020-07-27 ENCOUNTER — Telehealth: Payer: Self-pay

## 2020-07-27 NOTE — Telephone Encounter (Signed)
Called pt, no answer. LM for pt per DPR informing him of the information below. Advised pt to call back for questions or concerns.  

## 2020-07-27 NOTE — Telephone Encounter (Signed)
-----   Message from Sondra Come, MD sent at 07/27/2020  7:20 AM EST ----- No stones on xray, rtc prn  Legrand Rams, MD 07/27/2020

## 2020-07-27 NOTE — Telephone Encounter (Signed)
Patient returned call, informed of results. Voiced understanding.

## 2020-07-31 NOTE — Progress Notes (Deleted)
Assessment/Plan:    1.  Essential Tremor  ***Status post VIM DBS in June, 2008.  IPG removed in October, 2020 after draining chest wound.  IPG is now on the left abdomen.  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  This has been reprogrammed multiple times and does improve speech change, but patient ultimately returns it to the original setting each time.  In addition, he will turn up the setting.  He states that he would rather has speech trouble than tremor.  -Patient with poor compliance.  He has poor follow-up, and has certainly ignored his health (which resulted in removal of the IPG).  2.  Restless leg  -On gabapentin  3.  Hyperreflexia  -Patient otherwise asymptomatic.  Has older DBS and cannot be scanned with MRI.  Subjective:   David Rhodes was seen today in follow up for essential tremor.  My previous records were reviewed prior to todays visit. Pt denies falls.  Records reviewed since last visit.  Was recently in the emergency room for prostatitis, and returned back to the emergency room, but left before being seen, for the same.  He ultimately went to [facility] ER and a urethral stone was apparently removed and symptoms resolved.  Was seen by urology on January 18.  Current prescribed movement disorder medications: ***   PREVIOUS MEDICATIONS: {Parkinson's RX:18200}  ALLERGIES:   Allergies  Allergen Reactions  . Penicillins Shortness Of Breath    PATIENT HAS HAD A PCN REACTION WITH IMMEDIATE RASH, FACIAL/TONGUE/THROAT SWELLING, SOB, OR LIGHTHEADEDNESS WITH HYPOTENSION:  #  #  YES  #  #  Has patient had a PCN reaction causing severe rash involving mucus membranes or skin necrosis: No Has patient had a PCN reaction that required hospitalization No Has patient had a PCN reaction occurring within the last 10 years: No If all of the above answers are "NO", then may proceed with Cephalosporin use. **tolerated cefazolin and cephalexin 03/2017    CURRENT  MEDICATIONS:  Outpatient Encounter Medications as of 08/02/2020  Medication Sig  . albuterol (VENTOLIN HFA) 108 (90 Base) MCG/ACT inhaler Inhale 2 puffs into the lungs every 4 (four) hours as needed for wheezing or shortness of breath.  . baclofen (LIORESAL) 10 MG tablet Take 1 tablet (10 mg total) by mouth 3 (three) times daily.  . ciprofloxacin (CIPRO) 500 MG tablet Take 1 tablet (500 mg total) by mouth 2 (two) times daily.  . fluticasone furoate-vilanterol (BREO ELLIPTA) 100-25 MCG/INH AEPB Inhale 1 puff into the lungs daily.  . Fluticasone-Umeclidin-Vilant (TRELEGY ELLIPTA) 200-62.5-25 MCG/INH AEPB Inhale 1 puff into the lungs daily. Rinse mouth well after use  . gabapentin (NEURONTIN) 600 MG tablet Take 1 tablet (600 mg total) by mouth in the morning, at noon, in the evening, and at bedtime.  Marland Kitchen lisinopril (ZESTRIL) 40 MG tablet Take by mouth.  . loratadine (CLARITIN) 10 MG tablet Take 1 tablet (10 mg total) by mouth daily.  . meloxicam (MOBIC) 15 MG tablet TAKE 1 TABLET(15 MG) BY MOUTH DAILY (Patient taking differently: Take 15 mg by mouth daily.)  . montelukast (SINGULAIR) 10 MG tablet TAKE 1 TABLET(10 MG) BY MOUTH AT BEDTIME (Patient taking differently: Take 10 mg by mouth at bedtime.)  . omeprazole (PRILOSEC) 20 MG capsule TAKE 1 CAPSULE BY MOUTH DAILY (Patient taking differently: Take 20 mg by mouth daily.)  . OXYGEN Inhale into the lungs. 3 L of oxygen  . phenazopyridine (PYRIDIUM) 200 MG tablet Take 1 tablet (  200 mg total) by mouth 3 (three) times daily as needed (Pain with urination).  Marland Kitchen rOPINIRole (REQUIP) 2 MG tablet TAKE 1 TABLET BY MOUTH TWICE DAILY  . tamsulosin (FLOMAX) 0.4 MG CAPS capsule Take by mouth.   No facility-administered encounter medications on file as of 08/02/2020.     Objective:    PHYSICAL EXAMINATION:    VITALS:  There were no vitals filed for this visit.  GEN:  The patient appears stated age and is in NAD. HEENT:  Normocephalic, atraumatic.  The mucous  membranes are moist. The superficial temporal arteries are without ropiness or tenderness. CV:  RRR Lungs:  CTAB Neck/HEME:  There are no carotid bruits bilaterally.  Neurological examination:  Orientation: The patient is alert and oriented x3. Cranial nerves: There is good facial symmetry. The speech is fluent and clear. Soft palate rises symmetrically and there is no tongue deviation. Hearing is intact to conversational tone. Sensation: Sensation is intact to light touch throughout Motor: Strength is at least antigravity x4.  Movement examination: Tone: There is normal tone in the UE/LE Abnormal movements: *** Coordination:  There is *** decremation with RAM's, *** Gait and Station: The patient has *** difficulty arising out of a deep-seated chair without the use of the hands. The patient's stride length is good I have reviewed and interpreted the following labs independently   Chemistry      Component Value Date/Time   NA 143 07/14/2020 2157   NA 143 04/18/2020 1141   NA 138 06/26/2012 1812   K 3.9 07/14/2020 2157   K 4.0 06/26/2012 1812   CL 109 07/14/2020 2157   CL 106 06/26/2012 1812   CO2 26 07/14/2020 2157   CO2 24 06/26/2012 1812   BUN 12 07/14/2020 2157   BUN 7 (L) 04/18/2020 1141   BUN 13 06/26/2012 1812   CREATININE 0.85 07/14/2020 2157   CREATININE 0.76 04/16/2018 1206      Component Value Date/Time   CALCIUM 10.3 07/14/2020 2157   CALCIUM 9.4 06/26/2012 1812   ALKPHOS 65 07/14/2020 2157   ALKPHOS 84 07/20/2011 0208   AST 16 07/14/2020 2157   AST 18 07/20/2011 0208   ALT 13 07/14/2020 2157   ALT 22 07/20/2011 0208   BILITOT 0.5 07/14/2020 2157   BILITOT 0.6 04/18/2020 1141   BILITOT 0.3 07/20/2011 0208      Lab Results  Component Value Date   WBC 12.0 (H) 07/14/2020   HGB 14.8 07/14/2020   HCT 44.3 07/14/2020   MCV 88.6 07/14/2020   PLT 315 07/14/2020   Lab Results  Component Value Date   TSH 1.522 12/25/2018     Chemistry      Component  Value Date/Time   NA 143 07/14/2020 2157   NA 143 04/18/2020 1141   NA 138 06/26/2012 1812   K 3.9 07/14/2020 2157   K 4.0 06/26/2012 1812   CL 109 07/14/2020 2157   CL 106 06/26/2012 1812   CO2 26 07/14/2020 2157   CO2 24 06/26/2012 1812   BUN 12 07/14/2020 2157   BUN 7 (L) 04/18/2020 1141   BUN 13 06/26/2012 1812   CREATININE 0.85 07/14/2020 2157   CREATININE 0.76 04/16/2018 1206      Component Value Date/Time   CALCIUM 10.3 07/14/2020 2157   CALCIUM 9.4 06/26/2012 1812   ALKPHOS 65 07/14/2020 2157   ALKPHOS 84 07/20/2011 0208   AST 16 07/14/2020 2157   AST 18 07/20/2011 0208   ALT 13  07/14/2020 2157   ALT 22 07/20/2011 0208   BILITOT 0.5 07/14/2020 2157   BILITOT 0.6 04/18/2020 1141   BILITOT 0.3 07/20/2011 0208         Total time spent on today's visit was ***30 minutes, including both face-to-face time and nonface-to-face time.  Time included that spent on review of records (prior notes available to me/labs/imaging if pertinent), discussing treatment and goals, answering patient's questions and coordinating care.  Cc:  Reubin Milan, MD

## 2020-07-31 NOTE — Procedures (Deleted)
DBS Programming was performed.    Manufacturer of DBS device: [employer]onic  Total time spent programming was *** minutes.  Device was confirmed to be on.  Soft start was confirmed to be on.  Impedences were checked and were within normal limits.  Battery was checked and was determined to be functioning normally and not near the end of life.  Final settings were as follows:   Active Contact Amplitude (V) PW (ms) Frequency (hz) Side Effects Battery  Left Brain        08/02/20 8-C+ 3.9 70 140                            Right Brain        08/02/20 0-C+ 2.7 60 140

## 2020-08-02 ENCOUNTER — Encounter: Payer: Medicare HMO | Admitting: Neurology

## 2020-08-20 DIAGNOSIS — J441 Chronic obstructive pulmonary disease with (acute) exacerbation: Secondary | ICD-10-CM | POA: Diagnosis not present

## 2020-08-20 DIAGNOSIS — R918 Other nonspecific abnormal finding of lung field: Secondary | ICD-10-CM | POA: Diagnosis not present

## 2020-08-20 DIAGNOSIS — J9621 Acute and chronic respiratory failure with hypoxia: Secondary | ICD-10-CM | POA: Diagnosis not present

## 2020-08-20 DIAGNOSIS — J449 Chronic obstructive pulmonary disease, unspecified: Secondary | ICD-10-CM | POA: Diagnosis not present

## 2020-08-20 DIAGNOSIS — M25512 Pain in left shoulder: Secondary | ICD-10-CM | POA: Diagnosis not present

## 2020-09-12 ENCOUNTER — Other Ambulatory Visit: Payer: Self-pay | Admitting: Internal Medicine

## 2020-09-17 DIAGNOSIS — J441 Chronic obstructive pulmonary disease with (acute) exacerbation: Secondary | ICD-10-CM | POA: Diagnosis not present

## 2020-09-17 DIAGNOSIS — J449 Chronic obstructive pulmonary disease, unspecified: Secondary | ICD-10-CM | POA: Diagnosis not present

## 2020-09-17 DIAGNOSIS — M25512 Pain in left shoulder: Secondary | ICD-10-CM | POA: Diagnosis not present

## 2020-09-17 DIAGNOSIS — J9621 Acute and chronic respiratory failure with hypoxia: Secondary | ICD-10-CM | POA: Diagnosis not present

## 2020-09-17 DIAGNOSIS — R918 Other nonspecific abnormal finding of lung field: Secondary | ICD-10-CM | POA: Diagnosis not present

## 2020-09-28 ENCOUNTER — Other Ambulatory Visit: Payer: Self-pay | Admitting: Internal Medicine

## 2020-09-28 ENCOUNTER — Telehealth: Payer: Self-pay | Admitting: Internal Medicine

## 2020-09-28 NOTE — Telephone Encounter (Signed)
Left message for patient to call back and schedule Medicare Annual Wellness Visit (AWV) either virtually or in office. No detailed message left   AWV-I PER [place] AS OF 06/30/2009 please schedule at anytime  This should be a 40 minute visit.

## 2020-09-28 NOTE — Telephone Encounter (Signed)
Pt called in and was not sure when to schedule his visit. Pt stated he would call back when he is ready to make the appt. FYI

## 2020-10-18 ENCOUNTER — Telehealth: Payer: Self-pay

## 2020-10-18 DIAGNOSIS — M25512 Pain in left shoulder: Secondary | ICD-10-CM | POA: Diagnosis not present

## 2020-10-18 DIAGNOSIS — J441 Chronic obstructive pulmonary disease with (acute) exacerbation: Secondary | ICD-10-CM | POA: Diagnosis not present

## 2020-10-18 DIAGNOSIS — J9621 Acute and chronic respiratory failure with hypoxia: Secondary | ICD-10-CM | POA: Diagnosis not present

## 2020-10-18 DIAGNOSIS — R918 Other nonspecific abnormal finding of lung field: Secondary | ICD-10-CM | POA: Diagnosis not present

## 2020-10-18 DIAGNOSIS — J449 Chronic obstructive pulmonary disease, unspecified: Secondary | ICD-10-CM | POA: Diagnosis not present

## 2020-10-18 NOTE — Telephone Encounter (Signed)
His oxygen would come from home health- he needs to keep trying to contact them OR he may look on the oxygen tank and see if he can find a number of who is supplying the oxygen for him.

## 2020-10-18 NOTE — Telephone Encounter (Signed)
Spoke to pt will contact home health or number on tank

## 2020-10-18 NOTE — Telephone Encounter (Unsigned)
Copied from CRM [id number]. Topic: General - Other >> Oct 18, 2020  2:21 PM David Rhodes A wrote: Reason for CRM: Patient would like to be contacted by a member of staff when possible  Patient has concerns related to traveling with their oxygen tank as well as maintaining a supply of oxygen  Patient will be traveling to [place] within the next few weeks and is uncertain of how to get more oxygen  Patient has contacted their home healthcare provider with no success for assistance  Please contact to further advise when possible

## 2020-11-17 DIAGNOSIS — J9621 Acute and chronic respiratory failure with hypoxia: Secondary | ICD-10-CM | POA: Diagnosis not present

## 2020-11-17 DIAGNOSIS — R918 Other nonspecific abnormal finding of lung field: Secondary | ICD-10-CM | POA: Diagnosis not present

## 2020-11-17 DIAGNOSIS — J449 Chronic obstructive pulmonary disease, unspecified: Secondary | ICD-10-CM | POA: Diagnosis not present

## 2020-11-17 DIAGNOSIS — M25512 Pain in left shoulder: Secondary | ICD-10-CM | POA: Diagnosis not present

## 2020-11-17 DIAGNOSIS — J441 Chronic obstructive pulmonary disease with (acute) exacerbation: Secondary | ICD-10-CM | POA: Diagnosis not present

## 2020-12-10 ENCOUNTER — Telehealth: Payer: Self-pay

## 2020-12-10 NOTE — Telephone Encounter (Signed)
Copied from CRM [id number]. Topic: General - Other >> Dec 10, 2020  1:21 PM Jaquita Rector A wrote: Reason for CRM: Patient called in to inform Dr Judithann Graves that he need a new Rx to be sent to [facility] for his oxygen therapy say he spoke to them and they told him he need a new Rx. Also states that he will be going up [place] and need to have adequate oxygen to take with him so need this done possibly by Friday 12/14/2020. Please call with questions Ph# [phone]

## 2020-12-11 NOTE — Telephone Encounter (Signed)
Tried reaching patient on phone number listed but could not leave a message.

## 2020-12-13 NOTE — Telephone Encounter (Signed)
Pt called in to follow up on request. Made pt aware of message below per provider. Pt says that he will call his pulmonologist to have them send info to PCP.

## 2020-12-13 NOTE — Telephone Encounter (Signed)
Noted  For your information   

## 2020-12-18 ENCOUNTER — Telehealth: Payer: Self-pay

## 2020-12-18 ENCOUNTER — Ambulatory Visit: Payer: Self-pay

## 2020-12-18 DIAGNOSIS — J449 Chronic obstructive pulmonary disease, unspecified: Secondary | ICD-10-CM | POA: Diagnosis not present

## 2020-12-18 DIAGNOSIS — M25512 Pain in left shoulder: Secondary | ICD-10-CM | POA: Diagnosis not present

## 2020-12-18 DIAGNOSIS — J441 Chronic obstructive pulmonary disease with (acute) exacerbation: Secondary | ICD-10-CM | POA: Diagnosis not present

## 2020-12-18 DIAGNOSIS — R918 Other nonspecific abnormal finding of lung field: Secondary | ICD-10-CM | POA: Diagnosis not present

## 2020-12-18 DIAGNOSIS — J9621 Acute and chronic respiratory failure with hypoxia: Secondary | ICD-10-CM | POA: Diagnosis not present

## 2020-12-18 NOTE — Telephone Encounter (Signed)
Patient called and says he has SOB and a cough for the past month that is getting worse than his usual SOB. He says he's weaker and gets SOB when he's talking. He says he's wearing oxygen 3L/min. He says the cough is congested, patient coughed while on the phone several times and I could hear the congested cough. He says the phlegm gets stuck in his throat and he coughs hard to get it up and his chest hurts. He denies fever. I advised to go to the ED for evaluation. Patient says he will think about it. I advised the number to reach pulmonary to request a prescription for oxygen as per encounter today.   Reason for Disposition  [1] MODERATE difficulty breathing (e.g., speaks in phrases, SOB even at rest, pulse 100-120) AND [2] NEW-onset or WORSE than normal  Answer Assessment - Initial Assessment Questions 1. RESPIRATORY STATUS: "Describe your breathing?" (e.g., wheezing, shortness of breath, unable to speak, severe coughing)      Shortness of breath 2. ONSET: "When did this breathing problem begin?"      Ongoing for about a month 3. PATTERN "Does the difficult breathing come and go, or has it been constant since it started?"      Constant 4. SEVERITY: "How bad is your breathing?" (e.g., mild, moderate, severe)    - MILD: No SOB at rest, mild SOB with walking, speaks normally in sentences, can lie down, no retractions, pulse < 100.    - MODERATE: SOB at rest, SOB with minimal exertion and prefers to sit, cannot lie down flat, speaks in phrases, mild retractions, audible wheezing, pulse 100-120.    - SEVERE: Very SOB at rest, speaks in single words, struggling to breathe, sitting hunched forward, retractions, pulse > 120      Moderate 5. RECURRENT SYMPTOM: "Have you had difficulty breathing before?" If Yes, ask: "When was the last time?" and "What happened that time?"      Yes on oxygen 6. CARDIAC HISTORY: "Do you have any history of heart disease?" (e.g., heart attack, angina, bypass surgery,  angioplasty)      No 7. LUNG HISTORY: "Do you have any history of lung disease?"  (e.g., pulmonary embolus, asthma, emphysema)     COPD, asthma, emphysema 8. CAUSE: "What do you think is causing the breathing problem?"      No 9. OTHER SYMPTOMS: "Do you have any other symptoms? (e.g., dizziness, runny nose, cough, chest pain, fever)     Cough 10. O2 SATURATION MONITOR:  "Do you use an oxygen saturation monitor (pulse oximeter) at home?" If Yes, "What is your reading (oxygen level) today?" "What is your usual oxygen saturation reading?" (e.g., 95%)       No 11. PREGNANCY: "Is there any chance you are pregnant?" "When was your last menstrual period?"       N/A 12. TRAVEL: "Have you traveled out of the country in the last month?" (e.g., travel history, exposures)       No  Protocols used: Breathing Difficulty-A-AH

## 2020-12-18 NOTE — Telephone Encounter (Signed)
Patient called the patient relations number to say he needed to speak with Dr. Karn Cassis office about him not feeling well.   Tried calling pt 3X and VM is full and cannot leave a message.  Patient has not been seen since January of this Year and his next appt is scheduled for October 2022.  If patient returns the call to clinic, patient can schedule an appointment to see Dr. Judithann Graves or if symptoms are bad he may need to seek care at the ER.

## 2020-12-21 ENCOUNTER — Other Ambulatory Visit: Payer: Self-pay | Admitting: Internal Medicine

## 2020-12-21 DIAGNOSIS — G250 Essential tremor: Secondary | ICD-10-CM

## 2020-12-21 NOTE — Telephone Encounter (Signed)
Requested medications are due for refill today yes  Requested medications are on the active medication list yes  Last refill 4/1  Last visit 03/2020  Future visit scheduled 03/2021  Notes to clinic canceled appt in 07/2020, please assess.

## 2021-01-09 ENCOUNTER — Other Ambulatory Visit: Payer: Self-pay

## 2021-01-09 ENCOUNTER — Encounter: Payer: Self-pay | Admitting: Internal Medicine

## 2021-01-09 ENCOUNTER — Ambulatory Visit (INDEPENDENT_AMBULATORY_CARE_PROVIDER_SITE_OTHER): Payer: Medicare HMO | Admitting: Internal Medicine

## 2021-01-09 VITALS — BP 124/82 | HR 88 | Ht 72.0 in | Wt 150.0 lb

## 2021-01-09 DIAGNOSIS — R69 Illness, unspecified: Secondary | ICD-10-CM | POA: Diagnosis not present

## 2021-01-09 DIAGNOSIS — M26642 Arthritis of left temporomandibular joint: Secondary | ICD-10-CM | POA: Insufficient documentation

## 2021-01-09 DIAGNOSIS — K4090 Unilateral inguinal hernia, without obstruction or gangrene, not specified as recurrent: Secondary | ICD-10-CM | POA: Diagnosis not present

## 2021-01-09 DIAGNOSIS — F39 Unspecified mood [affective] disorder: Secondary | ICD-10-CM | POA: Diagnosis not present

## 2021-01-09 MED ORDER — SERTRALINE HCL 50 MG PO TABS: 50.0000 mg | ORAL_TABLET | Freq: Every day | ORAL | 1 refills | Status: DC

## 2021-01-09 MED ORDER — MELOXICAM 15 MG PO TABS: 15.0000 mg | ORAL_TABLET | Freq: Every day | ORAL | 0 refills | Status: DC

## 2021-01-09 NOTE — Progress Notes (Signed)
Date:  01/09/2021   Name:  David Rhodes   DOB:  02/19/1956   MRN:  [id number]   Chief Complaint: Jaw Pain (Started less than a month ago. Hurts when moving it. Left side of jaw. ) and Hernia (Feels a lump in groin area on the right side. Been there for several months. Painful to touch. Otherwise, does not feel it. )  Depression        This is a chronic problem.  The onset quality is gradual.   The problem occurs daily.  The problem has been gradually worsening since onset.  Associated symptoms include no suicidal ideas.  Past treatments include nothing. Jaw pain - new pain on left side.  No trauma, no dental issue (adentulous).  Much worse with yawning or chewing.  He has not taken any medication for this.  Hernia - hx of bilateral hernia repair in 2015.  Now has another bulge on the right.  It is not tender unless he pushes on it.    Lab Results  Component Value Date   CREATININE 0.85 07/14/2020   BUN 12 07/14/2020   NA 143 07/14/2020   K 3.9 07/14/2020   CL 109 07/14/2020   CO2 26 07/14/2020   Lab Results  Component Value Date   CHOL 154 04/18/2020   HDL 62 04/18/2020   LDLCALC 76 04/18/2020   TRIG 85 04/18/2020   CHOLHDL 2.5 04/18/2020   Lab Results  Component Value Date   TSH 1.522 12/25/2018   Lab Results  Component Value Date   HGBA1C 5.3 12/25/2018   Lab Results  Component Value Date   WBC 12.0 (H) 07/14/2020   HGB 14.8 07/14/2020   HCT 44.3 07/14/2020   MCV 88.6 07/14/2020   PLT 315 07/14/2020   Lab Results  Component Value Date   ALT 13 07/14/2020   AST 16 07/14/2020   ALKPHOS 65 07/14/2020   BILITOT 0.5 07/14/2020     Review of Systems  Constitutional:  Negative for chills and fever.  Respiratory:  Positive for cough, chest tightness and shortness of breath.   Cardiovascular:  Negative for chest pain.  Genitourinary:  Negative for difficulty urinating and testicular pain.       Right groin swelling  Musculoskeletal:  Positive for  arthralgias (left jaw pain).  Neurological:  Negative for dizziness.  Psychiatric/Behavioral:  Positive for depression, dysphoric mood and sleep disturbance. Negative for suicidal ideas. The patient is not hyperactive.    Patient Active Problem List   Diagnosis Date Noted   Dependence on supplemental oxygen 04/18/2020   Leukocytosis 04/15/2019   Inguinal hernia recurrent bilateral 11/16/2018   Chronic left shoulder pain 10/05/2018   PICC (peripherally inserted central catheter) in place 04/16/2018   Essential tremor 10/18/2017   Cervical radiculopathy 07/23/2017   Bilateral temporomandibular joint pain 07/23/2017   Neck pain 05/19/2017   Medication monitoring encounter 09/11/2016   Pulmonary nodules 12/31/2015   COPD (chronic obstructive pulmonary disease) (HCC) 12/27/2015   Chronic cough 12/27/2015   Asthma 09/24/2015   Allergic rhinitis 11/11/2014   Nerve root pain 11/11/2014   Dyskinesia 11/11/2014   Acid reflux 11/11/2014   Calcium blood increased 11/11/2014   Affective disorder (HCC) 11/11/2014   Essential hypertension 12/25/2011   Chronic back pain 12/25/2011    Allergies  Allergen Reactions   Penicillins Shortness Of Breath    PATIENT HAS HAD A PCN REACTION WITH IMMEDIATE RASH, FACIAL/TONGUE/THROAT SWELLING, SOB, OR LIGHTHEADEDNESS WITH HYPOTENSION:  #  #  YES  #  #  Has patient had a PCN reaction causing severe rash involving mucus membranes or skin necrosis: No Has patient had a PCN reaction that required hospitalization No Has patient had a PCN reaction occurring within the last 10 years: No If all of the above answers are "NO", then may proceed with Cephalosporin use. **tolerated cefazolin and cephalexin 03/2017    Past Surgical History:  Procedure Laterality Date   DEEP BRAIN STIMULATOR PLACEMENT     INGUINAL HERNIA REPAIR Bilateral 02/07/2014   Procedure:  BILATERAL INGUINAL HERNIA REPAIR;  Surgeon: Axel Filler, MD;  Location: [facility] OR;  Service: General;   Laterality: Bilateral;   INSERTION OF MESH Bilateral 02/07/2014   Procedure: INSERTION OF MESH;  Surgeon: Axel Filler, MD;  Location: [facility] OR;  Service: General;  Laterality: Bilateral;   NASAL SINUS SURGERY     PULSE GENERATOR IMPLANT Left 09/11/2016   Procedure: Left chest-Change implantable pulse generator battery;  Surgeon: Maeola Harman, MD;  Location: [facility] OR;  Service: Neurosurgery;  Laterality: Left;  Left chest-Change implantable pulse generator battery   PULSE GENERATOR IMPLANT Left 04/06/2018   Procedure: Revision of left chest implantable pulse generator, extension, and pocket adapter;  Surgeon: Maeola Harman, MD;  Location: [facility] OR;  Service: Neurosurgery;  Laterality: Left;   SUBTHALAMIC STIMULATOR BATTERY REPLACEMENT  12/09/2011   Procedure: SUBTHALAMIC STIMULATOR BATTERY REPLACEMENT;  Surgeon: Maeola Harman, MD;  Location: [facility];  Service: Neurosurgery;  Laterality: N/A;   deep brain stimulator, implantable pulse generator change   SUBTHALAMIC STIMULATOR BATTERY REPLACEMENT Left 04/21/2014   Procedure: Deep brain stimulator battery change;  Surgeon: Maeola Harman, MD;  Location: [facility];  Service: Neurosurgery;  Laterality: Left;  Deep brain stimulator battery change   SUBTHALAMIC STIMULATOR BATTERY REPLACEMENT N/A 08/10/2018   Procedure: Replacement of deep brain stimulator pulse generator/left abdomen with long lead extensions;  Surgeon: Maeola Harman, MD;  Location: [facility] OR;  Service: Neurosurgery;  Laterality: N/A;    Social History   Tobacco Use   Smoking status: Never   Smokeless tobacco: Current    Types: Chew  Vaping Use   Vaping Use: Never used  Substance Use Topics   Alcohol use: No    Alcohol/week: 0.0 standard drinks   Drug use: No     Medication list has been reviewed and updated.  Current Meds  Medication Sig   albuterol (VENTOLIN HFA) 108 (90 Base) MCG/ACT inhaler Inhale 2 puffs into the lungs every 4 (four) hours as needed for wheezing or shortness  of breath.   baclofen (LIORESAL) 10 MG tablet TAKE 1 TABLET(10 MG) BY MOUTH THREE TIMES DAILY   fluticasone furoate-vilanterol (BREO ELLIPTA) 100-25 MCG/INH AEPB Inhale 1 puff into the lungs daily.   Fluticasone-Umeclidin-Vilant (TRELEGY ELLIPTA) 200-62.5-25 MCG/INH AEPB Inhale 1 puff into the lungs daily. Rinse mouth well after use   gabapentin (NEURONTIN) 600 MG tablet TAKE 1 TABLET BY MOUTH EVERY MORNING, AT NOON, EVERY EVENING, AND AT BEDTIME   lisinopril (ZESTRIL) 40 MG tablet Take by mouth.   loratadine (CLARITIN) 10 MG tablet Take 1 tablet (10 mg total) by mouth daily.   meloxicam (MOBIC) 15 MG tablet TAKE 1 TABLET(15 MG) BY MOUTH DAILY (Patient taking differently: Take 15 mg by mouth daily.)   montelukast (SINGULAIR) 10 MG tablet TAKE 1 TABLET(10 MG) BY MOUTH AT BEDTIME (Patient taking differently: Take 10 mg by mouth at bedtime.)   omeprazole (PRILOSEC) 20 MG capsule TAKE 1 CAPSULE BY MOUTH DAILY (Patient taking  differently: Take 20 mg by mouth daily.)   OXYGEN Inhale into the lungs. 3 L of oxygen   phenazopyridine (PYRIDIUM) 200 MG tablet Take 1 tablet (200 mg total) by mouth 3 (three) times daily as needed (Pain with urination).   rOPINIRole (REQUIP) 2 MG tablet TAKE 1 TABLET BY MOUTH TWICE DAILY    PHQ 2/9 Scores 01/09/2021 07/13/2020 04/18/2020 05/23/2019  PHQ - 2 Score 3 2 4 3   PHQ- 9 Score 9 4 8 10     GAD 7 : Generalized Anxiety Score 01/09/2021 07/13/2020 04/18/2020  Nervous, Anxious, on Edge 1 0 1  Control/stop worrying 0 1 1  Worry too much - different things 0 1 1  Trouble relaxing 0 0 0  Restless 0 0 0  Easily annoyed or irritable 0 0 1  Afraid - awful might happen 0 0 0  Total GAD 7 Score 1 2 4   Anxiety Difficulty Not difficult at all - Not difficult at all    BP Readings from Last 3 Encounters:  01/09/21 124/82  07/26/20 135/89  07/17/20 116/70    Physical Exam Constitutional:      General: He is not in acute distress. HENT:     Head:     Jaw: Tenderness,  pain on movement and malocclusion present.  Cardiovascular:     Rate and Rhythm: Normal rate and regular rhythm.  Pulmonary:     Effort: Accessory muscle usage and prolonged expiration present.     Breath sounds: Decreased air movement present.  Abdominal:     Hernia: A hernia is present. Hernia is present in the right inguinal area. There is no hernia in the left inguinal area.  Musculoskeletal:     Cervical back: Normal range of motion.  Lymphadenopathy:     Cervical: No cervical adenopathy.  Neurological:     Mental Status: He is alert.  Psychiatric:        Attention and Perception: Attention normal.        Mood and Affect: Mood normal.        Thought Content: Thought content does not include suicidal ideation. Thought content does not include suicidal plan.        Cognition and Memory: Cognition normal.    Wt Readings from Last 3 Encounters:  01/09/21 150 lb (68 kg)  07/26/20 150 lb (68 kg)  07/17/20 150 lb (68 kg)    BP 124/82 (BP Location: Right Arm, Patient Position: Sitting, Cuff Size: Normal)   Pulse 88   Ht 6' (1.829 m)   Wt 150 lb (68 kg)   SpO2 97%   BMI 20.34 kg/m   Assessment and Plan: 1. Reducible right inguinal hernia Needs to see General Surgery - Ambulatory referral to General Surgery  2. Arthritis of left temporomandibular joint Recommend resuming Baclofen Start Mobic Avoid hard chewy and sticky foods - meloxicam (MOBIC) 15 MG tablet; Take 1 tablet (15 mg total) by mouth daily. TAKE 1 TABLET(15 MG) BY MOUTH DAILY  Dispense: 90 tablet; Refill: 0  3. Mood disorder (HCC) Worsening depressive sx due to medical illness and physical limitations Begin Sertraline Follow up in 6 weeks - sertraline (ZOLOFT) 50 MG tablet; Take 1 tablet (50 mg total) by mouth daily.  Dispense: 30 tablet; Refill: 1   Partially dictated using 01/11/21. Any errors are unintentional.  07/28/20, MD [facility]  01/09/2021

## 2021-01-09 NOTE — Patient Instructions (Addendum)
Start Meloxicam 15 mg once a day for jaw pain  Start Baclofen twice a day for jaw pain  Take Sertraline at bedtime daily - follow up with me in 6 weeks.

## 2021-01-15 ENCOUNTER — Other Ambulatory Visit: Payer: Self-pay | Admitting: Internal Medicine

## 2021-01-15 DIAGNOSIS — J439 Emphysema, unspecified: Secondary | ICD-10-CM

## 2021-01-17 DIAGNOSIS — R918 Other nonspecific abnormal finding of lung field: Secondary | ICD-10-CM | POA: Diagnosis not present

## 2021-01-17 DIAGNOSIS — M25512 Pain in left shoulder: Secondary | ICD-10-CM | POA: Diagnosis not present

## 2021-01-17 DIAGNOSIS — J449 Chronic obstructive pulmonary disease, unspecified: Secondary | ICD-10-CM | POA: Diagnosis not present

## 2021-01-17 DIAGNOSIS — J9621 Acute and chronic respiratory failure with hypoxia: Secondary | ICD-10-CM | POA: Diagnosis not present

## 2021-01-17 DIAGNOSIS — J441 Chronic obstructive pulmonary disease with (acute) exacerbation: Secondary | ICD-10-CM | POA: Diagnosis not present

## 2021-01-23 ENCOUNTER — Telehealth: Payer: Self-pay | Admitting: Pulmonary Disease

## 2021-01-23 ENCOUNTER — Ambulatory Visit: Payer: Medicare HMO | Admitting: Primary Care

## 2021-01-23 NOTE — Telephone Encounter (Signed)
Patient was dismissed from our practice 09/23/2019. Patient was scheduled for an office visit with Buelah Manis, NP today.  I spoke with patient once he arrived for his appointment and explained that he was dismissed from our practice, therefore we can not see him today. Patient was very understanding and stated that he now follows with [facility] pulmonary. Patient stated that his oxygen tank was low. I offered to check his oxygen level and place him on one of our oxygen tank but he declined the offer. It does not appear that medical record department has updated patient's chart. Lm for medical records

## 2021-01-25 NOTE — Telephone Encounter (Signed)
I have spoken to [place] with Medical records, who stated that she will make a flag in patient's chart. Nothing further needed at this time.

## 2021-02-05 ENCOUNTER — Telehealth: Payer: Self-pay

## 2021-02-05 NOTE — Telephone Encounter (Signed)
Copied from CRM [id number]. Topic: Referral - Question >> Feb 05, 2021  3:45 PM Aretta Nip wrote:  Reason for CRM: FU with pt leave a message if can't  get to phone. [phone] States he was referred to a lung dr out of [facility] by Dr B. I do not see anything pertaining to that, please FU with pt to advise

## 2021-02-06 NOTE — Telephone Encounter (Signed)
Called pt left VM stating that Dr. Judithann Graves placed a RF for general surgery for his hernia and not a lung specialist.  KP

## 2021-02-08 DIAGNOSIS — J42 Unspecified chronic bronchitis: Secondary | ICD-10-CM | POA: Diagnosis not present

## 2021-02-08 DIAGNOSIS — K4091 Unilateral inguinal hernia, without obstruction or gangrene, recurrent: Secondary | ICD-10-CM | POA: Diagnosis not present

## 2021-02-17 DIAGNOSIS — J441 Chronic obstructive pulmonary disease with (acute) exacerbation: Secondary | ICD-10-CM | POA: Diagnosis not present

## 2021-02-17 DIAGNOSIS — R918 Other nonspecific abnormal finding of lung field: Secondary | ICD-10-CM | POA: Diagnosis not present

## 2021-02-17 DIAGNOSIS — M25512 Pain in left shoulder: Secondary | ICD-10-CM | POA: Diagnosis not present

## 2021-02-17 DIAGNOSIS — J449 Chronic obstructive pulmonary disease, unspecified: Secondary | ICD-10-CM | POA: Diagnosis not present

## 2021-02-17 DIAGNOSIS — J9621 Acute and chronic respiratory failure with hypoxia: Secondary | ICD-10-CM | POA: Diagnosis not present

## 2021-02-26 ENCOUNTER — Ambulatory Visit (INDEPENDENT_AMBULATORY_CARE_PROVIDER_SITE_OTHER): Payer: Medicare Other | Admitting: Internal Medicine

## 2021-02-26 ENCOUNTER — Ambulatory Visit
Admission: RE | Admit: 2021-02-26 | Discharge: 2021-02-26 | Disposition: A | Discharge status: Home / Self Care | Payer: Medicare Other | Source: Ambulatory Visit | Attending: Internal Medicine | Admitting: Internal Medicine

## 2021-02-26 ENCOUNTER — Encounter: Payer: Self-pay | Admitting: Internal Medicine

## 2021-02-26 ENCOUNTER — Other Ambulatory Visit: Payer: Self-pay

## 2021-02-26 ENCOUNTER — Ambulatory Visit
Admission: RE | Admit: 2021-02-26 | Discharge: 2021-02-26 | Disposition: A | Discharge status: Home / Self Care | Payer: Medicare Other | Attending: Internal Medicine | Admitting: Internal Medicine

## 2021-02-26 VITALS — BP 118/70 | HR 90 | Ht 72.0 in | Wt 144.0 lb

## 2021-02-26 DIAGNOSIS — K4021 Bilateral inguinal hernia, without obstruction or gangrene, recurrent: Secondary | ICD-10-CM | POA: Diagnosis not present

## 2021-02-26 DIAGNOSIS — M1812 Unilateral primary osteoarthritis of first carpometacarpal joint, left hand: Secondary | ICD-10-CM | POA: Insufficient documentation

## 2021-02-26 DIAGNOSIS — M79645 Pain in left finger(s): Secondary | ICD-10-CM | POA: Diagnosis not present

## 2021-02-26 DIAGNOSIS — F321 Major depressive disorder, single episode, moderate: Secondary | ICD-10-CM | POA: Diagnosis not present

## 2021-02-26 DIAGNOSIS — I10 Essential (primary) hypertension: Secondary | ICD-10-CM | POA: Diagnosis not present

## 2021-02-26 DIAGNOSIS — J439 Emphysema, unspecified: Secondary | ICD-10-CM

## 2021-02-26 MED ORDER — SERTRALINE HCL 50 MG PO TABS: 50.0000 mg | ORAL_TABLET | Freq: Every day | ORAL | 1 refills | Status: DC

## 2021-02-26 NOTE — Progress Notes (Signed)
Date:  02/26/2021   Name:  David Rhodes   DOB:  1956-03-26   MRN:  [id number]   Chief Complaint: Mood Disorder  Depression        This is a chronic problem.  The problem occurs daily.The problem is unchanged.  Associated symptoms include decreased concentration, fatigue, helplessness, decreased interest, appetite change and sad.  Associated symptoms include no headaches and no suicidal ideas.  Treatments tried: did not start sertraline.  Previous treatment provided no relief relief. Hand Pain  Incident onset: jumped from behind last fall. The injury mechanism was a fall. The pain is present in the left hand. The quality of the pain is described as aching and cramping. The pain does not radiate. The pain is moderate. The pain has been Fluctuating since the incident. Pertinent negatives include no chest pain. The symptoms are aggravated by movement.  Hernia - recurrent inguinal hernia.  Seen by General Surgery who declines to perform surgery and recommended he [facility]  Patient has not heard from anyone.  The hernia is unchanged.  He is not wearing a truss.  He does not do any heavy lifting.  Lab Results  Component Value Date   CREATININE 0.85 07/14/2020   BUN 12 07/14/2020   NA 143 07/14/2020   K 3.9 07/14/2020   CL 109 07/14/2020   CO2 26 07/14/2020   Lab Results  Component Value Date   CHOL 154 04/18/2020   HDL 62 04/18/2020   LDLCALC 76 04/18/2020   TRIG 85 04/18/2020   CHOLHDL 2.5 04/18/2020   Lab Results  Component Value Date   TSH 1.522 12/25/2018   Lab Results  Component Value Date   HGBA1C 5.3 12/25/2018   Lab Results  Component Value Date   WBC 12.0 (H) 07/14/2020   HGB 14.8 07/14/2020   HCT 44.3 07/14/2020   MCV 88.6 07/14/2020   PLT 315 07/14/2020   Lab Results  Component Value Date   ALT 13 07/14/2020   AST 16 07/14/2020   ALKPHOS 65 07/14/2020   BILITOT 0.5 07/14/2020     Review of Systems  Constitutional:  Positive for appetite  change and fatigue.  Respiratory:  Positive for chest tightness and shortness of breath. Negative for wheezing.   Cardiovascular:  Negative for chest pain, palpitations and leg swelling.  Gastrointestinal:  Positive for abdominal distention (hernia).  Musculoskeletal:  Positive for arthralgias.  Neurological:  Negative for dizziness and headaches.  Psychiatric/Behavioral:  Positive for decreased concentration, depression and sleep disturbance. Negative for suicidal ideas. The patient is not nervous/anxious.    Patient Active Problem List   Diagnosis Date Noted   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   Dependence on supplemental oxygen 04/18/2020   Leukocytosis 04/15/2019   Inguinal hernia recurrent bilateral 11/16/2018   Chronic left shoulder pain 10/05/2018   PICC (peripherally inserted central catheter) in place 04/16/2018   Essential tremor 10/18/2017   Cervical radiculopathy 07/23/2017   Bilateral temporomandibular joint pain 07/23/2017   Neck pain 05/19/2017   Medication monitoring encounter 09/11/2016   Pulmonary nodules 12/31/2015   COPD (chronic obstructive pulmonary disease) (HCC) 12/27/2015   Chronic cough 12/27/2015   Asthma 09/24/2015   Allergic rhinitis 11/11/2014   Nerve root pain 11/11/2014   Dyskinesia 11/11/2014   Acid reflux 11/11/2014   Calcium blood increased 11/11/2014   Affective disorder (HCC) 11/11/2014   Essential hypertension 12/25/2011   Chronic back pain 12/25/2011    Allergies  Allergen  Reactions   Penicillins Shortness Of Breath    PATIENT HAS HAD A PCN REACTION WITH IMMEDIATE RASH, FACIAL/TONGUE/THROAT SWELLING, SOB, OR LIGHTHEADEDNESS WITH HYPOTENSION:  #  #  YES  #  #  Has patient had a PCN reaction causing severe rash involving mucus membranes or skin necrosis: No Has patient had a PCN reaction that required hospitalization No Has patient had a PCN reaction occurring within the last 10 years: No If all of the above answers are  "NO", then may proceed with Cephalosporin use. **tolerated cefazolin and cephalexin 03/2017    Past Surgical History:  Procedure Laterality Date   DEEP BRAIN STIMULATOR PLACEMENT     INGUINAL HERNIA REPAIR Bilateral 02/07/2014   Procedure:  BILATERAL INGUINAL HERNIA REPAIR;  Surgeon: Axel Filler, MD;  Location: [facility] OR;  Service: General;  Laterality: Bilateral;   INSERTION OF MESH Bilateral 02/07/2014   Procedure: INSERTION OF MESH;  Surgeon: Axel Filler, MD;  Location: [facility] OR;  Service: General;  Laterality: Bilateral;   NASAL SINUS SURGERY     PULSE GENERATOR IMPLANT Left 09/11/2016   Procedure: Left chest-Change implantable pulse generator battery;  Surgeon: Maeola Harman, MD;  Location[facility] OR;  Service: Neurosurgery;  Laterality: Left;  Left chest-Change implantable pulse generator battery   PULSE GENERATOR IMPLANT Left 04/06/2018   Procedure: Revision of left chest implantable pulse generator, extension, and pocket adapter;  Surgeon: Maeola Harman, MD;  Location: [facility] OR;  Service: Neurosurgery;  Laterality: Left;   SUBTHALAMIC STIMULATOR BATTERY REPLACEMENT  12/09/2011   Procedure: SUBTHALAMIC STIMULATOR BATTERY REPLACEMENT;  Surgeon: Maeola Harman, MD;  Location: [facility];  Service: Neurosurgery;  Laterality: N/A;   deep brain stimulator, implantable pulse generator change   SUBTHALAMIC STIMULATOR BATTERY REPLACEMENT Left 04/21/2014   Procedure: Deep brain stimulator battery change;  Surgeon: Maeola Harman, MD;  Location: [facility];  Service: Neurosurgery;  Laterality: Left;  Deep brain stimulator battery change   SUBTHALAMIC STIMULATOR BATTERY REPLACEMENT N/A 08/10/2018   Procedure: Replacement of deep brain stimulator pulse generator/left abdomen with long lead extensions;  Surgeon: Maeola Harman, MD;  Location: [facility] OR;  Service: Neurosurgery;  Laterality: N/A;    Social History   Tobacco Use   Smoking status: Former    Types: Cigarettes   Smokeless tobacco: Current    Types:  Chew  Vaping Use   Vaping Use: Never used  Substance Use Topics   Alcohol use: No    Alcohol/week: 0.0 standard drinks   Drug use: No     Medication list has been reviewed and updated.  Current Meds  Medication Sig   albuterol (VENTOLIN HFA) 108 (90 Base) MCG/ACT inhaler Inhale 2 puffs into the lungs every 4 (four) hours as needed for wheezing or shortness of breath.   baclofen (LIORESAL) 10 MG tablet TAKE 1 TABLET(10 MG) BY MOUTH THREE TIMES DAILY   budesonide (PULMICORT) 0.5 MG/2ML nebulizer solution Inhale into the lungs.   fluticasone furoate-vilanterol (BREO ELLIPTA) 100-25 MCG/INH AEPB INHALE 1 PUFF INTO THE LUNGS DAILY   Fluticasone-Umeclidin-Vilant (TRELEGY ELLIPTA) 200-62.5-25 MCG/INH AEPB Inhale 1 puff into the lungs daily. Rinse mouth well after use   gabapentin (NEURONTIN) 600 MG tablet TAKE 1 TABLET BY MOUTH EVERY MORNING, AT NOON, EVERY EVENING, AND AT BEDTIME   ipratropium-albuterol (DUONEB) 0.5-2.5 (3) MG/3ML SOLN Inhale into the lungs.   lisinopril (ZESTRIL) 40 MG tablet Take by mouth.   loratadine (CLARITIN) 10 MG tablet Take 1 tablet (10 mg total) by mouth daily.   meloxicam (  MOBIC) 15 MG tablet Take 1 tablet (15 mg total) by mouth daily. TAKE 1 TABLET(15 MG) BY MOUTH DAILY   montelukast (SINGULAIR) 10 MG tablet TAKE 1 TABLET(10 MG) BY MOUTH AT BEDTIME (Patient taking differently: Take 10 mg by mouth at bedtime.)   omeprazole (PRILOSEC) 20 MG capsule TAKE 1 CAPSULE BY MOUTH DAILY (Patient taking differently: Take 20 mg by mouth daily.)   OXYGEN Inhale into the lungs. 3 L of oxygen   phenazopyridine (PYRIDIUM) 200 MG tablet Take 1 tablet (200 mg total) by mouth 3 (three) times daily as needed (Pain with urination).   rOPINIRole (REQUIP) 2 MG tablet TAKE 1 TABLET BY MOUTH TWICE DAILY   sertraline (ZOLOFT) 50 MG tablet Take 1 tablet (50 mg total) by mouth daily.    PHQ 2/9 Scores 02/26/2021 01/09/2021 07/13/2020 04/18/2020  PHQ - 2 Score 4 3 2 4   PHQ- 9 Score 15 9 4 8      GAD 7 : Generalized Anxiety Score 02/26/2021 01/09/2021 07/13/2020 04/18/2020  Nervous, Anxious, on Edge 0 1 0 1  Control/stop worrying 0 0 1 1  Worry too much - different things 0 0 1 1  Trouble relaxing 1 0 0 0  Restless 0 0 0 0  Easily annoyed or irritable 1 0 0 1  Afraid - awful might happen 0 0 0 0  Total GAD 7 Score 2 1 2 4   Anxiety Difficulty - Not difficult at all - Not difficult at all    BP Readings from Last 3 Encounters:  02/26/21 118/70  01/09/21 124/82  07/26/20 135/89    Physical Exam Vitals and nursing note reviewed.  Constitutional:      General: He is not in acute distress.    Appearance: He is well-developed.  HENT:     Head: Normocephalic and atraumatic.  Cardiovascular:     Rate and Rhythm: Normal rate and regular rhythm.  Pulmonary:     Effort: Pulmonary effort is normal. No respiratory distress.     Breath sounds: Decreased air movement present. No decreased breath sounds or wheezing.  Musculoskeletal:     Right hand: Normal.     Left hand: Swelling (at the base of the thumb) present. No tenderness. Decreased range of motion. Decreased strength.     Cervical back: Normal range of motion.  Lymphadenopathy:     Cervical: No cervical adenopathy.  Skin:    General: Skin is warm and dry.     Findings: No rash.  Neurological:     Mental Status: He is alert and oriented to person, place, and time.  Psychiatric:        Mood and Affect: Mood normal.        Behavior: Behavior normal.    Wt Readings from Last 3 Encounters:  02/26/21 144 lb (65.3 kg)  01/09/21 150 lb (68 kg)  07/26/20 150 lb (68 kg)    BP 118/70   Pulse 90   Ht 6' (1.829 m)   Wt 144 lb (65.3 kg)   SpO2 95%   BMI 19.53 kg/m   Assessment and Plan: 1. Current moderate episode of major depressive disorder without prior episode [facility]) He never started Sertraline for unclear reasons.  He is willing to try so a new Rx is sent in. Follow up in 6 weeks. - sertraline (ZOLOFT) 50 MG  tablet; Take 1 tablet (50 mg total) by mouth daily.  Dispense: 30 tablet; Refill: 1  2. Essential hypertension BP is normal off of medication.  Will continue to monitor.  3. Thumb pain, left S/p fall last year.  Now with pain, swelling and decreased ROM. - DG Finger Thumb Left  4. Bilateral recurrent inguinal hernia without obstruction or gangrene [facility] place [facility] - Ambulatory referral to General Surgery  5. Pulmonary emphysema, unspecified emphysema type (HCC) On O2 around the clock. He wants a portable shoulder strap tank - it is too difficult to manage the tank on the cart Recommend he contact [employer] to see if he can get a small tank.   Partially dictated using Animal nutritionistDragon software. Any errors are unintentional.  Bari EdwardLaura Darlis Wragg, MD [facility]  02/26/2021

## 2021-03-18 ENCOUNTER — Ambulatory Visit (INDEPENDENT_AMBULATORY_CARE_PROVIDER_SITE_OTHER): Payer: Medicare Other

## 2021-03-18 ENCOUNTER — Other Ambulatory Visit: Payer: Self-pay

## 2021-03-18 VITALS — BP 122/84 | HR 68 | Temp 97.7°F | Resp 17 | Ht 72.0 in | Wt 147.6 lb

## 2021-03-18 DIAGNOSIS — Z0100 Encounter for examination of eyes and vision without abnormal findings: Secondary | ICD-10-CM

## 2021-03-18 DIAGNOSIS — Z0000 Encounter for general adult medical examination without abnormal findings: Secondary | ICD-10-CM

## 2021-03-18 DIAGNOSIS — Z23 Encounter for immunization: Secondary | ICD-10-CM | POA: Diagnosis not present

## 2021-03-18 NOTE — Patient Instructions (Signed)
David Rhodes , Thank you for taking time to come for your Medicare Wellness Visit. I appreciate your ongoing commitment to your health goals. Please review the following plan we discussed and let me know if I can assist you in the future.   Screening recommendations/referrals: Colonoscopy: declined Recommended yearly ophthalmology/optometry visit for glaucoma screening and checkup Recommended yearly dental visit for hygiene and checkup  Vaccinations: Influenza vaccine: declined Pneumococcal vaccine: Prevnar20 done today Tdap vaccine: done 03/31/19 Shingles vaccine: Shingrix discussed. Please contact your pharmacy for coverage information.  Covid-19: Shingrix discussed. Please contact your pharmacy for coverage information.   Advanced directives: Advance directive discussed with you today. I have provided a copy for you to complete at home and have notarized. Once this is complete please bring a copy in to our office so we can scan it into your chart.   Conditions/risks identified: recommend drinking 6-8 glasses of water per day   Next appointment: Follow up in one year for your annual wellness visit.   Preventive Care 30 Years and Older, Male Preventive care refers to lifestyle choices and visits with your health care provider that can promote health and wellness. What does preventive care include? A yearly physical exam. This is also called an annual well check. Dental exams once or twice a year. Routine eye exams. Ask your health care provider how often you should have your eyes checked. Personal lifestyle choices, including: Daily care of your teeth and gums. Regular physical activity. Eating a healthy diet. Avoiding tobacco and drug use. Limiting alcohol use. Practicing safe sex. Taking low doses of aspirin every day. Taking vitamin and mineral supplements as recommended by your health care provider. What happens during an annual well check? The services and screenings done by  your health care provider during your annual well check will depend on your age, overall health, lifestyle risk factors, and family history of disease. Counseling  Your health care provider may ask you questions about your: Alcohol use. Tobacco use. Drug use. Emotional well-being. Home and relationship well-being. Sexual activity. Eating habits. History of falls. Memory and ability to understand (cognition). Work and work Astronomer. Screening  You may have the following tests or measurements: Height, weight, and BMI. Blood pressure. Lipid and cholesterol levels. These may be checked every 5 years, or more frequently if you are over 60 years old. Skin check. Lung cancer screening. You may have this screening every year starting at age 42 if you have a 30-pack-year history of smoking and currently smoke or have quit within the past 15 years. Fecal occult blood test (FOBT) of the stool. You may have this test every year starting at age 51. Flexible sigmoidoscopy or colonoscopy. You may have a sigmoidoscopy every 5 years or a colonoscopy every 10 years starting at age 59. Prostate cancer screening. Recommendations will vary depending on your family history and other risks. Hepatitis C blood test. Hepatitis B blood test. Sexually transmitted disease (STD) testing. Diabetes screening. This is done by checking your blood sugar (glucose) after you have not eaten for a while (fasting). You may have this done every 1-3 years. Abdominal aortic aneurysm (AAA) screening. You may need this if you are a current or former smoker. Osteoporosis. You may be screened starting at age 9 if you are at high risk. Talk with your health care provider about your test results, treatment options, and if necessary, the need for more tests. Vaccines  Your health care provider may recommend certain vaccines, such as: Influenza  vaccine. This is recommended every year. Tetanus, diphtheria, and acellular pertussis  (Tdap, Td) vaccine. You may need a Td booster every 10 years. Zoster vaccine. You may need this after age 52. Pneumococcal 13-valent conjugate (PCV13) vaccine. One dose is recommended after age 74. Pneumococcal polysaccharide (PPSV23) vaccine. One dose is recommended after age 81. Talk to your health care provider about which screenings and vaccines you need and how often you need them. This information is not intended to replace advice given to you by your health care provider. Make sure you discuss any questions you have with your health care provider. Document Released: 07/13/2015 Document Revised: 03/05/2016 Document Reviewed: 04/17/2015 Elsevier Interactive Patient Education  2017 [place] Prevention in the Home Falls can cause injuries. They can happen to people of all ages. There are many things you can do to make your home safe and to help prevent falls. What can I do on the outside of my home? Regularly fix the edges of walkways and driveways and fix any cracks. Remove anything that might make you trip as you walk through a door, such as a raised step or threshold. Trim any bushes or trees on the path to your home. Use bright outdoor lighting. Clear any walking paths of anything that might make someone trip, such as rocks or tools. Regularly check to see if handrails are loose or broken. Make sure that both sides of any steps have handrails. Any raised decks and porches should have guardrails on the edges. Have any leaves, snow, or ice cleared regularly. Use sand or salt on walking paths during winter. Clean up any spills in your garage right away. This includes oil or grease spills. What can I do in the bathroom? Use night lights. Install grab bars by the toilet and in the tub and shower. Do not use towel bars as grab bars. Use non-skid mats or decals in the tub or shower. If you need to sit down in the shower, use a plastic, non-slip stool. Keep the floor dry. Clean  up any water that spills on the floor as soon as it happens. Remove soap buildup in the tub or shower regularly. Attach bath mats securely with double-sided non-slip rug tape. Do not have throw rugs and other things on the floor that can make you trip. What can I do in the bedroom? Use night lights. Make sure that you have a light by your bed that is easy to reach. Do not use any sheets or blankets that are too big for your bed. They should not hang down onto the floor. Have a firm chair that has side arms. You can use this for support while you get dressed. Do not have throw rugs and other things on the floor that can make you trip. What can I do in the kitchen? Clean up any spills right away. Avoid walking on wet floors. Keep items that you use a lot in easy-to-reach places. If you need to reach something above you, use a strong step stool that has a grab bar. Keep electrical cords out of the way. Do not use floor polish or wax that makes floors slippery. If you must use wax, use non-skid floor wax. Do not have throw rugs and other things on the floor that can make you trip. What can I do with my stairs? Do not leave any items on the stairs. Make sure that there are handrails on both sides of the stairs and use them. Fix  handrails that are broken or loose. Make sure that handrails are as long as the stairways. Check any carpeting to make sure that it is firmly attached to the stairs. Fix any carpet that is loose or worn. Avoid having throw rugs at the top or bottom of the stairs. If you do have throw rugs, attach them to the floor with carpet tape. Make sure that you have a light switch at the top of the stairs and the bottom of the stairs. If you do not have them, ask someone to add them for you. What else can I do to help prevent falls? Wear shoes that: Do not have high heels. Have rubber bottoms. Are comfortable and fit you well. Are closed at the toe. Do not wear sandals. If you  use a stepladder: Make sure that it is fully opened. Do not climb a closed stepladder. Make sure that both sides of the stepladder are locked into place. Ask someone to hold it for you, if possible. Clearly mark and make sure that you can see: Any grab bars or handrails. First and last steps. Where the edge of each step is. Use tools that help you move around (mobility aids) if they are needed. These include: Canes. Walkers. Scooters. Crutches. Turn on the lights when you go into a dark area. Replace any light bulbs as soon as they burn out. Set up your furniture so you have a clear path. Avoid moving your furniture around. If any of your floors are uneven, fix them. If there are any pets around you, be aware of where they are. Review your medicines with your doctor. Some medicines can make you feel dizzy. This can increase your chance of falling. Ask your doctor what other things that you can do to help prevent falls. This information is not intended to replace advice given to you by your health care provider. Make sure you discuss any questions you have with your health care provider. Document Released: 04/12/2009 Document Revised: 11/22/2015 Document Reviewed: 07/21/2014 Elsevier Interactive Patient Education  2017 [employer].

## 2021-03-18 NOTE — Progress Notes (Signed)
Subjective:   David Rhodes is a 64 y.o. male who presents for an Initial Medicare Annual Wellness Visit.  Review of Systems     Cardiac Risk Factors include: advanced age (>82men, >40 women);sedentary lifestyle;male gender     Objective:    Today's Vitals   03/18/21 0823  BP: 122/84  Pulse: 68  Resp: 17  Temp: 97.7 F (36.5 C)  TempSrc: Oral  SpO2: 99%  Weight: 147 lb 9.6 oz (67 kg)  Height: 6' (1.829 m)   Body mass index is 20.02 kg/m.  Advanced Directives 03/18/2021 12/26/2019 06/19/2019 05/19/2019 04/29/2019 03/31/2019 03/21/2019  Does Patient Have a Medical Advance Directive? No No No No No No No  Would patient like information on creating a medical advance directive? Yes (MAU/Ambulatory/Procedural Areas - Information given) - No - Patient declined - No - Patient declined - -    Current Medications (verified) Outpatient Encounter Medications as of 03/18/2021  Medication Sig   albuterol (VENTOLIN HFA) 108 (90 Base) MCG/ACT inhaler Inhale 2 puffs into the lungs every 4 (four) hours as needed for wheezing or shortness of breath.   baclofen (LIORESAL) 10 MG tablet TAKE 1 TABLET(10 MG) BY MOUTH THREE TIMES DAILY   budesonide (PULMICORT) 0.5 MG/2ML nebulizer solution Inhale into the lungs.   fluticasone furoate-vilanterol (BREO ELLIPTA) 100-25 MCG/INH AEPB INHALE 1 PUFF INTO THE LUNGS DAILY   gabapentin (NEURONTIN) 600 MG tablet TAKE 1 TABLET BY MOUTH EVERY MORNING, AT NOON, EVERY EVENING, AND AT BEDTIME   ipratropium-albuterol (DUONEB) 0.5-2.5 (3) MG/3ML SOLN Inhale into the lungs.   loratadine (CLARITIN) 10 MG tablet Take 1 tablet (10 mg total) by mouth daily.   meloxicam (MOBIC) 15 MG tablet Take 1 tablet (15 mg total) by mouth daily. TAKE 1 TABLET(15 MG) BY MOUTH DAILY   montelukast (SINGULAIR) 10 MG tablet TAKE 1 TABLET(10 MG) BY MOUTH AT BEDTIME (Patient taking differently: Take 10 mg by mouth at bedtime.)   omeprazole (PRILOSEC) 20 MG capsule TAKE 1 CAPSULE BY MOUTH  DAILY (Patient taking differently: Take 20 mg by mouth daily.)   OXYGEN Inhale into the lungs. 3 L of oxygen   rOPINIRole (REQUIP) 2 MG tablet TAKE 1 TABLET BY MOUTH TWICE DAILY   sertraline (ZOLOFT) 50 MG tablet Take 1 tablet (50 mg total) by mouth daily.   predniSONE (DELTASONE) 20 MG tablet Take 20 mg by mouth daily. (Patient not taking: Reported on 03/18/2021)   [DISCONTINUED] Fluticasone-Umeclidin-Vilant (TRELEGY ELLIPTA) 200-62.5-25 MCG/INH AEPB Inhale 1 puff into the lungs daily. Rinse mouth well after use   [DISCONTINUED] phenazopyridine (PYRIDIUM) 200 MG tablet Take 1 tablet (200 mg total) by mouth 3 (three) times daily as needed (Pain with urination).   No facility-administered encounter medications on file as of 03/18/2021.    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   Inguinal hernia    bilateral   Pneumonia    many years ago   Shortness of breath    when allergies flare up   Urine incontinence    Wears glasses  Past Surgical History:  Procedure Laterality Date   DEEP BRAIN STIMULATOR PLACEMENT     INGUINAL HERNIA REPAIR Bilateral 02/07/2014   Procedure:  BILATERAL INGUINAL HERNIA REPAIR;  Surgeon: Axel Filler, MD;  Location: [facility] OR;  Service: General;  Laterality: Bilateral;   INSERTION OF MESH Bilateral 02/07/2014   Procedure: INSERTION OF MESH;  Surgeon: Axel Filler, MD;  Location: [facility] OR;  Service: General;  Laterality: Bilateral;   NASAL SINUS SURGERY     PULSE GENERATOR IMPLANT Left 09/11/2016   Procedure: Left chest-Change implantable pulse generator battery;  Surgeon: Maeola Harman, MD;  Location: [facility] OR;  Service:  Neurosurgery;  Laterality: Left;  Left chest-Change implantable pulse generator battery   PULSE GENERATOR IMPLANT Left 04/06/2018   Procedure: Revision of left chest implantable pulse generator, extension, and pocket adapter;  Surgeon: Maeola Harman, MD;  Location: [facility] OR;  Service: Neurosurgery;  Laterality: Left;   SUBTHALAMIC STIMULATOR BATTERY REPLACEMENT  12/09/2011   Procedure: SUBTHALAMIC STIMULATOR BATTERY REPLACEMENT;  Surgeon: Maeola Harman, MD;  Location: [facility] OR[facility];  Service: Neurosurgery;  Laterality: N/A;   deep brain stimulator, implantable pulse generator change   SUBTHALAMIC STIMULATOR BATTERY REPLACEMENT Left 04/21/2014   Procedure: Deep brain stimulator battery change;  Surgeon: Maeola Harman, MD;  Location: [facility] OR[facility];  Service: Neurosurgery;  Laterality: Left;  Deep brain stimulator battery change   SUBTHALAMIC STIMULATOR BATTERY REPLACEMENT N/A 08/10/2018   Procedure: Replacement of deep brain stimulator pulse generator/left abdomen with long lead extensions;  Surgeon: Maeola Harman, MD;  Location: [facility] OR;  Service: Neurosurgery;  Laterality: N/A;   Family History  Problem Relation Age of Onset   Myoclonus Mother    Breast cancer Mother    Prostate cancer Father    Heart disease Paternal Grandfather    Myoclonus Maternal Uncle    Social History   Socioeconomic History   Marital status: Single    Spouse name: 0   Number of children: Not on file   Years of education: Not on file   Highest education level: Associate degree: occupational, Scientist, product/process development, or vocational program  Occupational History   Not on file  Tobacco Use   Smoking status: Former    Types: Cigarettes   Smokeless tobacco: Current    Types: Associate Professor Use: Never used  Substance and Sexual Activity   Alcohol use: No    Alcohol/week: 0.0 standard drinks   Drug use: No   Sexual activity: Not on file  Other Topics Concern   Not on file  Social History Narrative   Pt lives alone    disabled   Social Determinants of Health   Financial Resource Strain: Medium Risk   Difficulty of Paying Living Expenses: Somewhat hard  Food Insecurity: No Food Insecurity   Worried About Programme researcher, broadcasting/film/video in the Last Year: Never true   Ran Out of Food in the Last Year: Never true  Transportation Needs: No Transportation Needs   Lack of Transportation (Medical): No   Lack of Transportation (Non-Medical): No  Physical Activity: Inactive   Days of Exercise per Week: 0 days   Minutes of Exercise per Session: 0 min  Stress: No Stress Concern Present   Feeling of Stress : Only a little  Social Connections: Socially Isolated   Frequency of Communication with Friends and Family: More than three times a week   Frequency of Social Gatherings with Friends and Family: Three times a week   Attends Religious Services: Never  Active Member of Clubs or Organizations: No   Attends Banker Meetings: Never   Marital Status: Never married    Tobacco Counseling Ready to quit: No Counseling given: Not Answered   Clinical Intake:  Pre-visit preparation completed: Yes  Pain : No/denies pain     BMI - recorded: 20.02 Nutritional Status: BMI of 19-24  Normal Nutritional Risks: None Diabetes: No  How often do you need to have someone help you when you read instructions, pamphlets, or other written materials from your doctor or pharmacy?: 1 - Never    Interpreter Needed?: No  Information entered by :: Reather Littler LPN   Activities of Daily Living In your present state of health, do you have any difficulty performing the following activities: 03/18/2021 02/26/2021  Hearing? N N  Vision? Y Y  Difficulty concentrating or making decisions? N N  Walking or climbing stairs? N N  Dressing or bathing? N N  Doing errands, shopping? N N  Preparing Food and eating ? N -  Using the Toilet? N -  In the past six months, have you accidently leaked urine? N -  Do you have problems  with loss of bowel control? N -  Managing your Medications? N -  Managing your Finances? N -  Housekeeping or managing your Housekeeping? N -  Some recent data might be hidden    Patient Care Team: Reubin Milan, MD as PCP - General (Internal Medicine) Mertie Moores, MD as Referring Physician (Pulmonary Disease) Maeola Harman, MD as Consulting Physician (Neurosurgery) Rosey Bath, MD (Inactive) as Referring Physician (Hematology and Oncology) Janora Norlander, MD as Referring Physician (Pulmonary Disease)  Indicate any recent Medical Services you may have received from other than [facility] providers in the past year (date may be approximate).     Assessment:   This is a routine wellness examination for David Rhodes.  Hearing/Vision screen Hearing Screening - Comments:: Pt denies hearing difficulty Vision Screening - Comments:: Pt due for eye exam; referral to ophthalmology sent today  Dietary issues and exercise activities discussed: Current Exercise Habits: The patient does not participate in regular exercise at present, Exercise limited by: respiratory conditions(s)   Goals Addressed             This Visit's Progress    DIET - INCREASE WATER INTAKE       Recommend drinking 6-8 glasses of water per day        Depression Screen PHQ 2/9 Scores 03/18/2021 02/26/2021 01/09/2021 07/13/2020 04/18/2020 05/23/2019 04/14/2019  PHQ - 2 Score 6 4 3 2 4 3  0  PHQ- 9 Score 15 15 9 4 8 10  -    Fall Risk Fall Risk  03/18/2021 02/26/2021 01/09/2021 07/13/2020 04/18/2020  Falls in the past year? 0 0 0 0 0  Number falls in past yr: 0 0 0 - 0  Injury with Fall? 0 0 0 - 0  Risk for fall due to : Other (Comment) No Fall Risks Impaired balance/gait - No Fall Risks  Risk for fall due to: Comment oxygen tank - - - -  Follow up Falls prevention discussed Falls evaluation completed Falls evaluation completed Falls evaluation completed Falls evaluation completed    FALL RISK PREVENTION  PERTAINING TO THE HOME:  Any stairs in or around the home? Yes  If so, are there any without handrails? No  Home free of loose throw rugs in walkways, pet beds, electrical cords, etc? Yes  Adequate lighting in your  home to reduce risk of falls? Yes   ASSISTIVE DEVICES UTILIZED TO PREVENT FALLS:  Life alert? No  Use of a cane, walker or w/c? No  Grab bars in the bathroom? No  Shower chair or bench in shower? No  Elevated toilet seat or a handicapped toilet? No   TIMED UP AND GO:  Was the test performed? Yes .  Length of time to ambulate 10 feet: 7 sec.   Gait slow and steady without use of assistive device  Cognitive Function: Normal cognitive status assessed by direct observation by this Nurse Health Advisor. No abnormalities found.          Immunizations Immunization History  Administered Date(s) Administered   PNEUMOCOCCAL CONJUGATE-20 03/18/2021   Pneumococcal Polysaccharide-23 04/14/2019   Tdap 03/31/2019    TDAP status: Up to date  Flu Vaccine status: Declined, Education has been provided regarding the importance of this vaccine but patient still declined. Advised may receive this vaccine at local pharmacy or Health Dept. Aware to provide a copy of the vaccination record if obtained from local pharmacy or Health Dept. Verbalized acceptance and understanding.  Pneumococcal vaccine status: Completed during today's visit.  Covid-19 vaccine status: Declined, Education has been provided regarding the importance of this vaccine but patient still declined. Advised may receive this vaccine at local pharmacy or Health Dept.or vaccine clinic. Aware to provide a copy of the vaccination record if obtained from local pharmacy or Health Dept. Verbalized acceptance and understanding.  Qualifies for Shingles Vaccine? Yes   Zostavax completed No   Shingrix Completed?: No.    Education has been provided regarding the importance of this vaccine. Patient has been advised to call  insurance company to determine out of pocket expense if they have not yet received this vaccine. Advised may also receive vaccine at local pharmacy or Health Dept. Verbalized acceptance and understanding.  Screening Tests Health Maintenance  Topic Date Due   COVID-19 Vaccine (1) Never done   Zoster Vaccines- Shingrix (1 of 2) 04/11/2021 (Originally 09/23/1974)   COLONOSCOPY (Pts 45-35yrs Insurance coverage will need to be confirmed)  04/18/2021 (Originally 09/22/2000)   INFLUENZA VACCINE  09/27/2021 (Originally 01/28/2021)   TETANUS/TDAP  03/30/2029   Hepatitis C Screening  Completed   HIV Screening  Completed   HPV VACCINES  Aged Out    Health Maintenance  Health Maintenance Due  Topic Date Due   COVID-19 Vaccine (1) Never done    Colorectal Cancer Screening: pt declined colonoscopy  Lung Cancer Screening: (Low Dose CT Chest recommended if Age 33-80 years, 30 pack-year currently smoking OR have quit w/in 15years.) does not qualify.    Additional Screening:  Hepatitis C Screening: does qualify; Completed 4/720  Vision Screening: Recommended annual ophthalmology exams for early detection of glaucoma and other disorders of the eye. Is the patient up to date with their annual eye exam?  No  Who is the provider or what is the name of the office in which the patient attends annual eye exams? Not established If pt is not established with a provider, would they like to be referred to a provider to establish care? Yes .   Dental Screening: Recommended annual dental exams for proper oral hygiene  Community Resource Referral / Chronic Care Management: CRR required this visit?  No  - pt states difficulty with utilities, declined CRR referral today  CCM required this visit?  No      Plan:     I have personally reviewed and noted  the following in the patient's chart:   Medical and social history Use of alcohol, tobacco or illicit drugs  Current medications and supplements including  opioid prescriptions. Patient is not currently taking opioid prescriptions. Functional ability and status Nutritional status Physical activity Advanced directives List of other physicians Hospitalizations, surgeries, and ER visits in previous 12 months Vitals Screenings to include cognitive, depression, and falls Referrals and appointments  In addition, I have reviewed and discussed with patient certain preventive protocols, quality metrics, and best practice recommendations. A written personalized care plan for preventive services as well as general preventive health recommendations were provided to patient.     Reather Littler, LPN   10/06/8117   Nurse Notes: none

## 2021-03-20 DIAGNOSIS — J441 Chronic obstructive pulmonary disease with (acute) exacerbation: Secondary | ICD-10-CM | POA: Diagnosis not present

## 2021-03-20 DIAGNOSIS — R918 Other nonspecific abnormal finding of lung field: Secondary | ICD-10-CM | POA: Diagnosis not present

## 2021-03-20 DIAGNOSIS — J9621 Acute and chronic respiratory failure with hypoxia: Secondary | ICD-10-CM | POA: Diagnosis not present

## 2021-03-20 DIAGNOSIS — J449 Chronic obstructive pulmonary disease, unspecified: Secondary | ICD-10-CM | POA: Diagnosis not present

## 2021-03-20 DIAGNOSIS — M25512 Pain in left shoulder: Secondary | ICD-10-CM | POA: Diagnosis not present

## 2021-03-21 ENCOUNTER — Other Ambulatory Visit: Payer: Self-pay | Admitting: Internal Medicine

## 2021-03-21 DIAGNOSIS — G250 Essential tremor: Secondary | ICD-10-CM

## 2021-04-10 ENCOUNTER — Ambulatory Visit: Payer: Self-pay | Admitting: *Deleted

## 2021-04-10 NOTE — Telephone Encounter (Signed)
C/o not feeling well and reports zoloft in ineffective. C/o feeling no desire to do anything all of the time "dragging" , "no desire to do anything". C/o difficulty breathing hx COPD. NT attempted to ask patient questions and patient became agitated and reports he "can't do anything about breathing" and refused further questions. Because it takes too much energy to answer questions.  Patient said Bye, and hung up. Called patient back to clarify request for PCP.Marland Kitchen Patient answered phone and reports "do not listen" to what he is requesting . Patient clarified zoloft does not work and he is not tired or fatigued but has no desire to do anything and hung up again. Please advise. Unable to give care advise.

## 2021-04-10 NOTE — Telephone Encounter (Signed)
Reason for Disposition  [1] MODERATE longstanding difficulty breathing (e.g., speaks in phrases, SOB even at rest, pulse 100-120) AND [2] SAME as normal  Answer Assessment - Initial Assessment Questions 1. RESPIRATORY STATUS: "Describe your breathing?" (e.g., wheezing, shortness of breath, unable to speak, severe coughing)      "Always have difficulty breathing due to COPD" 2. ONSET: "When did this breathing problem begin?"      na 3. PATTERN "Does the difficult breathing come and go, or has it been constant since it started?"      na 4. SEVERITY: "How bad is your breathing?" (e.g., mild, moderate, severe)    - MILD: No SOB at rest, mild SOB with walking, speaks normally in sentences, can lie down, no retractions, pulse < 100.    - MODERATE: SOB at rest, SOB with minimal exertion and prefers to sit, cannot lie down flat, speaks in phrases, mild retractions, audible wheezing, pulse 100-120.    - SEVERE: Very SOB at rest, speaks in single words, struggling to breathe, sitting hunched forward, retractions, pulse > 120      Getting worse on past few years  5. RECURRENT SYMPTOM: "Have you had difficulty breathing before?" If Yes, ask: "When was the last time?" and "What happened that time?"      Hx COPD 6. CARDIAC HISTORY: "Do you have any history of heart disease?" (e.g., heart attack, angina, bypass surgery, angioplasty)      na 7. LUNG HISTORY: "Do you have any history of lung disease?"  (e.g., pulmonary embolus, asthma, emphysema)     Hx COPD 8. CAUSE: "What do you think is causing the breathing problem?"      Na  9. OTHER SYMPTOMS: "Do you have any other symptoms? (e.g., dizziness, runny nose, cough, chest pain, fever)     na 10. O2 SATURATION MONITOR:  "Do you use an oxygen saturation monitor (pulse oximeter) at home?" If Yes, "What is your reading (oxygen level) today?" "What is your usual oxygen saturation reading?" (e.g., 95%)       na 11. PREGNANCY: "Is there any chance you are  pregnant?" "When was your last menstrual period?"       na 12. TRAVEL: "Have you traveled out of the country in the last month?" (e.g., travel history, exposures)       na  Protocols used: Breathing Difficulty-A-AH

## 2021-04-11 ENCOUNTER — Telehealth: Payer: Self-pay

## 2021-04-11 NOTE — Telephone Encounter (Signed)
Called pt left VM to call back. ? ?PEC nurse may give results to patient if they return call to clinic, a CRM has been created. ? ?KP

## 2021-04-11 NOTE — Telephone Encounter (Signed)
Copied from CRM [id number]. Topic: General - Other >> Apr 10, 2021  4:59 PM Gaetana Michaelis A wrote: Reason for CRM: The patient has called to share that they believe their sertraline (ZOLOFT) 50 MG tablet [[id number] is ineffective  The patient would like to be prescribed an alternative if possible  The patient shares that they took two of the tablets more than a week ago and continued to feel no effect   He patient would like to discuss this further when possible

## 2021-04-15 ENCOUNTER — Other Ambulatory Visit: Payer: Self-pay | Admitting: Internal Medicine

## 2021-04-15 DIAGNOSIS — J449 Chronic obstructive pulmonary disease, unspecified: Secondary | ICD-10-CM | POA: Diagnosis not present

## 2021-04-15 DIAGNOSIS — K4091 Unilateral inguinal hernia, without obstruction or gangrene, recurrent: Secondary | ICD-10-CM | POA: Diagnosis not present

## 2021-04-15 DIAGNOSIS — M26642 Arthritis of left temporomandibular joint: Secondary | ICD-10-CM

## 2021-04-15 DIAGNOSIS — Z9981 Dependence on supplemental oxygen: Secondary | ICD-10-CM | POA: Diagnosis not present

## 2021-04-19 DIAGNOSIS — M25512 Pain in left shoulder: Secondary | ICD-10-CM | POA: Diagnosis not present

## 2021-04-19 DIAGNOSIS — J449 Chronic obstructive pulmonary disease, unspecified: Secondary | ICD-10-CM | POA: Diagnosis not present

## 2021-04-19 DIAGNOSIS — J9621 Acute and chronic respiratory failure with hypoxia: Secondary | ICD-10-CM | POA: Diagnosis not present

## 2021-04-19 DIAGNOSIS — R918 Other nonspecific abnormal finding of lung field: Secondary | ICD-10-CM | POA: Diagnosis not present

## 2021-04-19 DIAGNOSIS — J441 Chronic obstructive pulmonary disease with (acute) exacerbation: Secondary | ICD-10-CM | POA: Diagnosis not present

## 2021-04-22 ENCOUNTER — Other Ambulatory Visit: Payer: Self-pay

## 2021-04-22 ENCOUNTER — Encounter: Payer: Self-pay | Admitting: Internal Medicine

## 2021-04-22 ENCOUNTER — Ambulatory Visit (INDEPENDENT_AMBULATORY_CARE_PROVIDER_SITE_OTHER): Payer: Medicare Other | Admitting: Internal Medicine

## 2021-04-22 ENCOUNTER — Other Ambulatory Visit
Admission: RE | Admit: 2021-04-22 | Discharge: 2021-04-22 | Disposition: A | Discharge status: Home / Self Care | Payer: Medicare Other | Attending: Internal Medicine | Admitting: Internal Medicine

## 2021-04-22 VITALS — BP 124/88 | HR 81 | Ht 72.0 in | Wt 146.0 lb

## 2021-04-22 DIAGNOSIS — Z0000 Encounter for general adult medical examination without abnormal findings: Secondary | ICD-10-CM

## 2021-04-22 DIAGNOSIS — J439 Emphysema, unspecified: Secondary | ICD-10-CM | POA: Diagnosis not present

## 2021-04-22 DIAGNOSIS — Z1211 Encounter for screening for malignant neoplasm of colon: Secondary | ICD-10-CM | POA: Diagnosis not present

## 2021-04-22 DIAGNOSIS — G250 Essential tremor: Secondary | ICD-10-CM

## 2021-04-22 DIAGNOSIS — R030 Elevated blood-pressure reading, without diagnosis of hypertension: Secondary | ICD-10-CM

## 2021-04-22 DIAGNOSIS — F321 Major depressive disorder, single episode, moderate: Secondary | ICD-10-CM | POA: Diagnosis not present

## 2021-04-22 DIAGNOSIS — Z125 Encounter for screening for malignant neoplasm of prostate: Secondary | ICD-10-CM | POA: Insufficient documentation

## 2021-04-22 DIAGNOSIS — I10 Essential (primary) hypertension: Secondary | ICD-10-CM | POA: Diagnosis not present

## 2021-04-22 DIAGNOSIS — M79645 Pain in left finger(s): Secondary | ICD-10-CM | POA: Diagnosis not present

## 2021-04-22 LAB — CBC WITH DIFFERENTIAL/PLATELET
Abs Immature Granulocytes: 0.02 10*3/uL (ref 0.00–0.07)
Basophils Absolute: 0.1 10*3/uL (ref 0.0–0.1)
Basophils Relative: 1 %
Eosinophils Absolute: 1.4 10*3/uL — ABNORMAL HIGH (ref 0.0–0.5)
Eosinophils Relative: 12 %
HCT: 47.8 % (ref 39.0–52.0)
Hemoglobin: 15.8 g/dL (ref 13.0–17.0)
Immature Granulocytes: 0 %
Lymphocytes Relative: 19 %
Lymphs Abs: 2.2 10*3/uL (ref 0.7–4.0)
MCH: 29.8 pg (ref 26.0–34.0)
MCHC: 33.1 g/dL (ref 30.0–36.0)
MCV: 90 fL (ref 80.0–100.0)
Monocytes Absolute: 0.8 10*3/uL (ref 0.1–1.0)
Monocytes Relative: 7 %
Neutro Abs: 6.9 10*3/uL (ref 1.7–7.7)
Neutrophils Relative %: 61 %
Platelets: 257 10*3/uL (ref 150–400)
RBC: 5.31 MIL/uL (ref 4.22–5.81)
RDW: 13.2 % (ref 11.5–15.5)
WBC: 11.5 10*3/uL — ABNORMAL HIGH (ref 4.0–10.5)
nRBC: 0 % (ref 0.0–0.2)

## 2021-04-22 LAB — COMPREHENSIVE METABOLIC PANEL
ALT: 17 U/L (ref 0–44)
AST: 21 U/L (ref 15–41)
Albumin: 4.1 g/dL (ref 3.5–5.0)
Alkaline Phosphatase: 75 U/L (ref 38–126)
Anion gap: 4 — ABNORMAL LOW (ref 5–15)
BUN: 11 mg/dL (ref 8–23)
CO2: 28 mmol/L (ref 22–32)
Calcium: 10.5 mg/dL — ABNORMAL HIGH (ref 8.9–10.3)
Chloride: 106 mmol/L (ref 98–111)
Creatinine, Ser: 0.77 mg/dL (ref 0.61–1.24)
GFR, Estimated: >60 mL/min (ref >60)
Glucose, Bld: 88 mg/dL (ref 70–99)
Potassium: 4.3 mmol/L (ref 3.5–5.1)
Sodium: 138 mmol/L (ref 135–145)
Total Bilirubin: 0.8 mg/dL (ref 0.3–1.2)
Total Protein: 7.3 g/dL (ref 6.5–8.1)

## 2021-04-22 LAB — LIPID PANEL
Cholesterol: 119 mg/dL (ref 0–200)
HDL: 59 mg/dL (ref >40)
LDL Cholesterol: 52 mg/dL (ref 0–99)
Total CHOL/HDL Ratio: 2 RATIO
Triglycerides: 41 mg/dL (ref <150)
VLDL: 8 mg/dL (ref 0–40)

## 2021-04-22 LAB — TSH: TSH: 1.019 u[IU]/mL (ref 0.350–4.500)

## 2021-04-22 LAB — PSA: Prostatic Specific Antigen: 0.62 ng/mL (ref 0.00–4.00)

## 2021-04-22 MED ORDER — MONTELUKAST SODIUM 10 MG PO TABS: 10.0000 mg | ORAL_TABLET | Freq: Every day | ORAL | 3 refills | Status: DC

## 2021-04-22 NOTE — Progress Notes (Signed)
Date:  04/22/2021   Name:  David Rhodes   DOB:  [birth year]   MRN:  [id number]   Chief Complaint: Annual Exam David Rhodes is a 65 y.o. male who presents today for his Complete Annual Exam. He feels poorly. He reports exercising - none at this time. He reports he is sleeping poorly.   Colonoscopy: none  Immunization History  Administered Date(s) Administered   PNEUMOCOCCAL CONJUGATE-20 03/18/2021   Pneumococcal Polysaccharide-23 04/14/2019   Tdap 03/31/2019    Hypertension This is a chronic problem. The problem is controlled. Associated symptoms include shortness of breath. Pertinent negatives include no chest pain, headaches or palpitations.  Depression        This is a chronic problem.  The problem occurs daily.The problem is unchanged (he only took sertraline for one week and stopped when he did not have any benefit).  Associated symptoms include decreased concentration, fatigue and appetite change.  Associated symptoms include no headaches and no suicidal ideas.  Treatments tried: did not start sertraline.  Previous treatment provided no relief relief.  Lab Results  Component Value Date   CREATININE 0.85 07/14/2020   BUN 12 07/14/2020   NA 143 07/14/2020   K 3.9 07/14/2020   CL 109 07/14/2020   CO2 26 07/14/2020   Lab Results  Component Value Date   CHOL 154 04/18/2020   HDL 62 04/18/2020   LDLCALC 76 04/18/2020   TRIG 85 04/18/2020   CHOLHDL 2.5 04/18/2020   Lab Results  Component Value Date   TSH 1.522 12/25/2018   Lab Results  Component Value Date   HGBA1C 5.3 12/25/2018   Lab Results  Component Value Date   WBC 12.0 (H) 07/14/2020   HGB 14.8 07/14/2020   HCT 44.3 07/14/2020   MCV 88.6 07/14/2020   PLT 315 07/14/2020   Lab Results  Component Value Date   ALT 13 07/14/2020   AST 16 07/14/2020   ALKPHOS 65 07/14/2020   BILITOT 0.5 07/14/2020   Lab Results  Component Value Date   PSA1 0.6 04/18/2020   PSA1 0.5 04/14/2019   PSA1 0.4  12/28/2014   PSA 0.3 11/03/2013     Review of Systems  Constitutional:  Positive for appetite change and fatigue.  HENT:  Positive for tinnitus. Negative for ear pain and trouble swallowing.   Eyes:  Negative for visual disturbance.  Respiratory:  Positive for chest tightness and shortness of breath. Negative for wheezing.   Cardiovascular:  Negative for chest pain, palpitations and leg swelling.  Gastrointestinal:  Positive for abdominal distention (hernia). Negative for blood in stool, constipation and diarrhea.  Genitourinary:  Negative for frequency and hematuria.  Musculoskeletal:  Positive for arthralgias (left thumb). Negative for gait problem.  Skin:  Negative for color change and rash.  Neurological:  Positive for tremors and light-headedness. Negative for dizziness and headaches.  Psychiatric/Behavioral:  Positive for decreased concentration, depression and sleep disturbance. Negative for suicidal ideas. The patient is not nervous/anxious.    Patient Active Problem List   Diagnosis Date Noted   Current moderate episode of major depressive disorder without prior episode (HCC) 02/26/2021   Thumb pain, left 02/26/2021   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   Dependence on supplemental oxygen 04/18/2020   Leukocytosis 04/15/2019   Inguinal hernia recurrent bilateral 11/16/2018   Chronic left shoulder pain 10/05/2018   PICC (peripherally inserted central catheter) in place 04/16/2018   Essential tremor 10/18/2017   Cervical radiculopathy 07/23/2017  Bilateral temporomandibular joint pain 07/23/2017   Neck pain 05/19/2017   Medication monitoring encounter 09/11/2016   Pulmonary nodules 12/31/2015   COPD (chronic obstructive pulmonary disease) (HCC) 12/27/2015   Chronic cough 12/27/2015   Asthma 09/24/2015   Allergic rhinitis 11/11/2014   Nerve root pain 11/11/2014   Dyskinesia 11/11/2014   Acid reflux 11/11/2014   Calcium blood increased 11/11/2014    Affective disorder (HCC) 11/11/2014   Essential hypertension 12/25/2011   Chronic back pain 12/25/2011    Allergies  Allergen Reactions   Penicillins Shortness Of Breath    PATIENT HAS HAD A PCN REACTION WITH IMMEDIATE RASH, FACIAL/TONGUE/THROAT SWELLING, SOB, OR LIGHTHEADEDNESS WITH HYPOTENSION:  #  #  YES  #  #  Has patient had a PCN reaction causing severe rash involving mucus membranes or skin necrosis: No Has patient had a PCN reaction that required hospitalization No Has patient had a PCN reaction occurring within the last 10 years: No If all of the above answers are "NO", then may proceed with Cephalosporin use. **tolerated cefazolin and cephalexin 03/2017    Past Surgical History:  Procedure Laterality Date   DEEP BRAIN STIMULATOR PLACEMENT     INGUINAL HERNIA REPAIR Bilateral 02/07/2014   Procedure:  BILATERAL INGUINAL HERNIA REPAIR;  Surgeon: Axel Filler, MD;  Location: [facility] OR;  Service: General;  Laterality: Bilateral;   INSERTION OF MESH Bilateral 02/07/2014   Procedure: INSERTION OF MESH;  Surgeon: Axel Filler, MD;  Location: [facility] OR;  Service: General;  Laterality: Bilateral;   NASAL SINUS SURGERY     PULSE GENERATOR IMPLANT Left 09/11/2016   Procedure: Left chest-Change implantable pulse generator battery;  Surgeon: Maeola Harman, MD;  Location: [facility] OR;  Service: Neurosurgery;  Laterality: Left;  Left chest-Change implantable pulse generator battery   PULSE GENERATOR IMPLANT Left 04/06/2018   Procedure: Revision of left chest implantable pulse generator, extension, and pocket adapter;  Surgeon: Maeola Harman, MD;  Location: [facility] OR;  Service: Neurosurgery;  Laterality: Left;   SUBTHALAMIC STIMULATOR BATTERY REPLACEMENT  12/09/2011   Procedure: SUBTHALAMIC STIMULATOR BATTERY REPLACEMENT;  Surgeon: Maeola Harman, MD;  Location: [facility];  Service: Neurosurgery;  Laterality: N/A;   deep brain stimulator, implantable pulse generator change   SUBTHALAMIC STIMULATOR BATTERY  REPLACEMENT Left 04/21/2014   Procedure: Deep brain stimulator battery change;  Surgeon: Maeola Harman, MD;  Location: [facility];  Service: Neurosurgery;  Laterality: Left;  Deep brain stimulator battery change   SUBTHALAMIC STIMULATOR BATTERY REPLACEMENT N/A 08/10/2018   Procedure: Replacement of deep brain stimulator pulse generator/left abdomen with long lead extensions;  Surgeon: Maeola Harman, MD;  Location[facility];  Service: Neurosurgery;  Laterality: N/A;    Social History   Tobacco Use   Smoking status: Former    Types: Cigarettes   Smokeless tobacco: Current    Types: Chew  Vaping Use   Vaping Use: Never used  Substance Use Topics   Alcohol use: No    Alcohol/week: 0.0 standard drinks   Drug use: No     Medication list has been reviewed and updated.  Current Meds  Medication Sig   albuterol (VENTOLIN HFA) 108 (90 Base) MCG/ACT inhaler Inhale 2 puffs into the lungs every 4 (four) hours as needed for wheezing or shortness of breath.   baclofen (LIORESAL) 10 MG tablet TAKE 1 TABLET(10 MG) BY MOUTH THREE TIMES DAILY   budesonide (PULMICORT) 0.5 MG/2ML nebulizer solution Inhale into the lungs.   fluticasone furoate-vilanterol (BREO ELLIPTA) 100-25 MCG/INH AEPB INHALE  1 PUFF INTO THE LUNGS DAILY   gabapentin (NEURONTIN) 600 MG tablet TAKE 1 TABLET BY MOUTH EVERY MORNING, 1 TABLET AT NOON, 1 TABLET EVERY EVENING AND 1 TABLET AT BEDTIME   ipratropium-albuterol (DUONEB) 0.5-2.5 (3) MG/3ML SOLN Inhale into the lungs.   loratadine (CLARITIN) 10 MG tablet Take 1 tablet (10 mg total) by mouth daily.   meloxicam (MOBIC) 15 MG tablet TAKE 1 TABLET(15 MG) BY MOUTH DAILY   montelukast (SINGULAIR) 10 MG tablet TAKE 1 TABLET(10 MG) BY MOUTH AT BEDTIME (Patient taking differently: Take 10 mg by mouth at bedtime.)   omeprazole (PRILOSEC) 20 MG capsule TAKE 1 CAPSULE BY MOUTH DAILY (Patient taking differently: Take 20 mg by mouth daily.)   OXYGEN Inhale into the lungs. 3 L of oxygen    predniSONE (DELTASONE) 20 MG tablet Take 20 mg by mouth daily.   rOPINIRole (REQUIP) 2 MG tablet TAKE 1 TABLET BY MOUTH TWICE DAILY   sertraline (ZOLOFT) 50 MG tablet Take 1 tablet (50 mg total) by mouth daily.    PHQ 2/9 Scores 04/22/2021 03/18/2021 02/26/2021 01/09/2021  PHQ - 2 Score 6 6 4 3   PHQ- 9 Score 18 15 15 9     GAD 7 : Generalized Anxiety Score 04/22/2021 02/26/2021 01/09/2021 07/13/2020  Nervous, Anxious, on Edge 0 0 1 0  Control/stop worrying 0 0 0 1  Worry too much - different things 0 0 0 1  Trouble relaxing 1 1 0 0  Restless 0 0 0 0  Easily annoyed or irritable 0 1 0 0  Afraid - awful might happen 0 0 0 0  Total GAD 7 Score 1 2 1 2   Anxiety Difficulty Not difficult at all - Not difficult at all -    BP Readings from Last 3 Encounters:  04/22/21 124/88  03/18/21 122/84  02/26/21 118/70    Physical Exam Vitals and nursing note reviewed.  Constitutional:      Appearance: Normal appearance. He is well-developed.  HENT:     Head: Normocephalic.     Right Ear: There is impacted cerumen.     Left Ear: Tympanic membrane normal.     Nose: Nose normal.  Eyes:     Conjunctiva/sclera: Conjunctivae normal.     Pupils: Pupils are equal, round, and reactive to light.  Neck:     Thyroid: No thyromegaly.     Vascular: No carotid bruit.  Cardiovascular:     Rate and Rhythm: Normal rate and regular rhythm.     Heart sounds: Normal heart sounds.  Pulmonary:     Effort: Accessory muscle usage present.     Breath sounds: Decreased air movement present. No wheezing.  Chest:     Chest wall: No mass, deformity, swelling or tenderness.  Breasts:    Right: No mass.     Left: No mass.  Abdominal:     General: Bowel sounds are normal.     Palpations: Abdomen is soft.     Tenderness: There is no abdominal tenderness.     Hernia: A hernia is present. Hernia is present in the right inguinal area.  Musculoskeletal:        General: Normal range of motion.     Left hand: Swelling,  deformity and tenderness present.     Cervical back: Normal range of motion and neck supple.     Right lower leg: No edema.     Left lower leg: No edema.  Lymphadenopathy:     Cervical: No cervical adenopathy.  Skin:    General: Skin is warm and dry.  Neurological:     Mental Status: He is alert and oriented to person, place, and time.     Sensory: Sensation is intact.     Motor: Tremor present.     Deep Tendon Reflexes: Reflexes are normal and symmetric.  Psychiatric:        Attention and Perception: Attention normal.        Mood and Affect: Mood normal.        Thought Content: Thought content normal.    Wt Readings from Last 3 Encounters:  04/22/21 146 lb (66.2 kg)  03/18/21 147 lb 9.6 oz (67 kg)  02/26/21 144 lb (65.3 kg)    BP 124/88   Pulse 81   Ht 6' (1.829 m)   Wt 146 lb (66.2 kg)   SpO2 97%   BMI 19.80 kg/m   Assessment and Plan: 1. Annual physical exam Exam stable; no significant change. Right inguinal hernia repair planned for December - Lipid panel  2. Colon cancer screening He declines screening at this time  3. Prostate cancer screening DRE deferred - PSA  4. 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 Elevated blood pressure, situational Normal today - continue to monitor at intervals - CBC with Differential/Platelet - Comprehensive metabolic panel - POCT urinalysis dipstick  6. Thumb pain, left Degenerative changes seen on imaging; now with apparent mucus cyst causing more discomfort - Ambulatory referral to Orthopedic Surgery  7. Pulmonary emphysema, unspecified emphysema type (HCC) Followed by Pulmonary.   Dependent on O2; on Breo, Duonebs, Pulmicort - montelukast (SINGULAIR) 10 MG tablet; Take 1 tablet (10 mg total) by mouth at bedtime. TAKE 1 TABLET(10 MG) BY MOUTH AT  BEDTIME  Dispense: 90 tablet; Refill: 3  8. Essential tremor Improved but still having difficulty with fine motor skills like holding a cup. He is followed closely by Neurosurgery with deep brain stimulator.   Partially dictated using Animal nutritionist. Any errors are unintentional.  Bari Edward, MD [facility]  04/22/2021

## 2021-04-23 DIAGNOSIS — M189 Osteoarthritis of first carpometacarpal joint, unspecified: Secondary | ICD-10-CM | POA: Diagnosis not present

## 2021-04-23 DIAGNOSIS — S66012A Strain of long flexor muscle, fascia and tendon of left thumb at wrist and hand level, initial encounter: Secondary | ICD-10-CM | POA: Diagnosis not present

## 2021-04-28 ENCOUNTER — Other Ambulatory Visit: Payer: Self-pay | Admitting: Internal Medicine

## 2021-04-28 DIAGNOSIS — J439 Emphysema, unspecified: Secondary | ICD-10-CM

## 2021-04-28 NOTE — Telephone Encounter (Signed)
Requested Prescriptions  Pending Prescriptions Disp Refills  . albuterol (VENTOLIN HFA) 108 (90 Base) MCG/ACT inhaler [Pharmacy Med Name: ALBUTEROL HFA INH(200 PUFFS)18GM] 18 g 5    Sig: INHALE 2 PUFFS INTO THE LUNGS EVERY 4 HOURS AS NEEDED FOR WHEEZING OR SHORTNESS OF BREATH     Pulmonology:  Beta Agonists Failed - 04/28/2021  5:30 PM      Failed - One inhaler should last at least one month. If the patient is requesting refills earlier, contact the patient to check for uncontrolled symptoms.      Passed - Valid encounter within last 12 months    Recent Outpatient Visits          6 days ago Annual physical exam   [facility] Reubin Milan, MD   2 months ago Current moderate episode of major depressive disorder without prior episode [facility])   [facility] Reubin Milan, MD   3 months ago Reducible right inguinal hernia   [facility] Reubin Milan, MD   9 months ago Urgency of urination   [facility] Reubin Milan, MD   1 year ago Annual physical exam   [facility] Reubin Milan, MD      Future Appointments            In 12 months Judithann Graves Nyoka Cowden, MD [facility], [facility]

## 2021-04-29 ENCOUNTER — Telehealth: Payer: Self-pay | Admitting: Internal Medicine

## 2021-04-29 NOTE — Telephone Encounter (Signed)
Unable to select David Rhodes for reordering due to error message below, routing requests to office for refill.

## 2021-04-29 NOTE — Telephone Encounter (Signed)
Medication Refill - Medication: budesonide (PULMICORT) 0.5 MG/2ML nebulizer solution   fluticasone furoate-vilanterol (BREO ELLIPTA) 100-25 MCG/INH AEPB  Has the patient contacted their pharmacy? Yes.   (Agent: If no, request that the patient contact the pharmacy for the refill. If patient does not wish to contact the pharmacy document the reason why and proceed with request.) (Agent: If yes, when and what did the pharmacy advise?)  Preferred Pharmacy (with phone number or street name):  [id number] [place], [place] - [address] AT [facility] OF [address]  [address] [place] [address]  Phone: [phone] Fax: [phone]   Has the patient been seen for an appointment in the last year OR does the patient have an upcoming appointment? Yes.    Agent: Please be advised that RX refills may take up to 3 business days. We ask that you follow-up with your pharmacy.

## 2021-04-30 ENCOUNTER — Other Ambulatory Visit: Payer: Self-pay

## 2021-04-30 MED ORDER — FLUTICASONE FUROATE-VILANTEROL 100-25 MCG/ACT IN AEPB: 1.0000 | INHALATION_SPRAY | Freq: Every day | RESPIRATORY_TRACT | 1 refills | Status: DC

## 2021-04-30 NOTE — Telephone Encounter (Signed)
Refilled Breo

## 2021-05-11 ENCOUNTER — Ambulatory Visit (INDEPENDENT_AMBULATORY_CARE_PROVIDER_SITE_OTHER): Payer: Medicare Other

## 2021-05-11 ENCOUNTER — Ambulatory Visit
Admission: EM | Admit: 2021-05-11 | Discharge: 2021-05-11 | Disposition: A | Discharge status: Home / Self Care | Payer: Medicare Other | Attending: Physician Assistant | Admitting: Physician Assistant

## 2021-05-11 ENCOUNTER — Other Ambulatory Visit: Payer: Self-pay

## 2021-05-11 DIAGNOSIS — R059 Cough, unspecified: Secondary | ICD-10-CM | POA: Diagnosis not present

## 2021-05-11 DIAGNOSIS — J441 Chronic obstructive pulmonary disease with (acute) exacerbation: Secondary | ICD-10-CM | POA: Insufficient documentation

## 2021-05-11 DIAGNOSIS — Z9981 Dependence on supplemental oxygen: Secondary | ICD-10-CM | POA: Insufficient documentation

## 2021-05-11 DIAGNOSIS — G250 Essential tremor: Secondary | ICD-10-CM | POA: Diagnosis not present

## 2021-05-11 DIAGNOSIS — R0602 Shortness of breath: Secondary | ICD-10-CM | POA: Diagnosis not present

## 2021-05-11 DIAGNOSIS — R051 Acute cough: Secondary | ICD-10-CM | POA: Insufficient documentation

## 2021-05-11 DIAGNOSIS — F419 Anxiety disorder, unspecified: Secondary | ICD-10-CM | POA: Diagnosis not present

## 2021-05-11 DIAGNOSIS — Z20822 Contact with and (suspected) exposure to covid-19: Secondary | ICD-10-CM | POA: Insufficient documentation

## 2021-05-11 DIAGNOSIS — G8929 Other chronic pain: Secondary | ICD-10-CM | POA: Diagnosis not present

## 2021-05-11 DIAGNOSIS — J45909 Unspecified asthma, uncomplicated: Secondary | ICD-10-CM | POA: Insufficient documentation

## 2021-05-11 DIAGNOSIS — J449 Chronic obstructive pulmonary disease, unspecified: Secondary | ICD-10-CM | POA: Diagnosis not present

## 2021-05-11 DIAGNOSIS — I10 Essential (primary) hypertension: Secondary | ICD-10-CM | POA: Insufficient documentation

## 2021-05-11 LAB — RESP PANEL BY RT-PCR (FLU A&B, COVID) ARPGX2
Influenza A by PCR: NEGATIVE
Influenza B by PCR: NEGATIVE
SARS Coronavirus 2 by RT PCR: NEGATIVE

## 2021-05-11 MED ORDER — IPRATROPIUM-ALBUTEROL 0.5-2.5 (3) MG/3ML IN SOLN
3.0000 mL | Freq: Once | RESPIRATORY_TRACT | Status: AC
  Administered 2021-05-11: 3 mL via RESPIRATORY_TRACT

## 2021-05-11 MED ORDER — PREDNISONE 20 MG PO TABS: ORAL_TABLET | ORAL | 0 refills | Status: DC

## 2021-05-11 MED ORDER — DOXYCYCLINE HYCLATE 100 MG PO CAPS: 100.0000 mg | ORAL_CAPSULE | Freq: Two times a day (BID) | ORAL | 0 refills | Status: AC

## 2021-05-11 MED ORDER — METHYLPREDNISOLONE SODIUM SUCC 125 MG IJ SOLR
125.0000 mg | Freq: Once | INTRAMUSCULAR | Status: AC
Start: 2021-05-11 — End: 2021-05-11
  Administered 2021-05-11: 125 mg via INTRAMUSCULAR

## 2021-05-11 NOTE — ED Provider Notes (Signed)
[facility]    CSN: [id number] Arrival date & time: 05/11/21  1154      History   Chief Complaint Chief Complaint  Patient presents with   Shortness of Breath    HPI David Rhodes is a 65 y.o. male with history of COPD, allergies, and asthma.  Patient presents today for more shortness of breath than usual.  Patient is having difficulty speaking in full sentences due to feeling short of breath and wheezing.  Patient normally is on 3 L of oxygen at home.  He is still on 3 L of oxygen.  He says his at home breathing treatments have not helped and he believes he may need some corticosteroids at this point.  Reports a productive cough of tan-colored sputum.  He denies any associated fevers or pain in his chest.  No sick contacts.  No COVID or flu exposure.  Patient has been using his inhalers as prescribed but says when he has shortness of breath like this it is difficulty even performing nebulizer treatment himself.  Patient reports that he has been to the emergency department for COPD exacerbations in the past but has not been this year.  He has no history of DVT or PE.  He says his current symptoms are consistent with a COPD exacerbation.  Other medical history significant for anxiety, chronic pain, depression, essential tremors, hypertension which is mostly situational.  No other concerns today.  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   Inguinal hernia    bilateral   Pneumonia    many years ago   Shortness of breath    when allergies flare up   Urine incontinence    Wears glasses     Patient Active Problem List   Diagnosis Date Noted   Current moderate  episode of major depressive disorder without prior episode (HCC) 02/26/2021   Thumb pain, left 02/26/2021   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   Dependence on supplemental oxygen 04/18/2020   Leukocytosis 04/15/2019   Inguinal hernia recurrent bilateral 11/16/2018   Chronic left shoulder pain 10/05/2018   PICC (peripherally inserted central catheter) in place 04/16/2018   Essential tremor 10/18/2017   Cervical radiculopathy 07/23/2017   Bilateral temporomandibular joint pain 07/23/2017   Neck pain 05/19/2017   Medication monitoring encounter 09/11/2016   Pulmonary nodules 12/31/2015   COPD (chronic obstructive pulmonary disease) (HCC) 12/27/2015   Chronic cough 12/27/2015   Asthma 09/24/2015   Allergic rhinitis 11/11/2014   Nerve root pain 11/11/2014   Dyskinesia 11/11/2014   Acid reflux 11/11/2014   Calcium blood increased 11/11/2014   Affective disorder (HCC) 11/11/2014   Essential hypertension 12/25/2011   Chronic back pain 12/25/2011    Past Surgical History:  Procedure Laterality Date   DEEP BRAIN STIMULATOR PLACEMENT     INGUINAL HERNIA REPAIR Bilateral 02/07/2014   Procedure:  BILATERAL INGUINAL HERNIA REPAIR;  Surgeon: Axel Filler, MD;  Location: [facility] OR;  Service: General;  Laterality: Bilateral;   INSERTION OF MESH Bilateral 02/07/2014   Procedure: INSERTION OF MESH;  Surgeon: Axel Filler,  MD;  Location: [facility] OR;  Service: General;  Laterality: Bilateral;   NASAL SINUS SURGERY     PULSE GENERATOR IMPLANT Left 09/11/2016   Procedure: Left chest-Change implantable pulse generator battery;  Surgeon: Maeola Harman, MD;  Location: [facility] OR;  Service: Neurosurgery;  Laterality: Left;  Left chest-Change implantable pulse generator battery   PULSE GENERATOR IMPLANT Left 04/06/2018   Procedure: Revision of left chest implantable pulse generator, extension, and pocket adapter;  Surgeon: Maeola Harman, MD;  Location: [facility] OR;  Service: Neurosurgery;  Laterality: Left;    SUBTHALAMIC STIMULATOR BATTERY REPLACEMENT  12/09/2011   Procedure: SUBTHALAMIC STIMULATOR BATTERY REPLACEMENT;  Surgeon: Maeola Harman, MD;  Location: [facility];  Service: Neurosurgery;  Laterality: N/A;   deep brain stimulator, implantable pulse generator change   SUBTHALAMIC STIMULATOR BATTERY REPLACEMENT Left 04/21/2014   Procedure: Deep brain stimulator battery change;  Surgeon: Maeola Harman, MD;  Location: [facility];  Service: Neurosurgery;  Laterality: Left;  Deep brain stimulator battery change   SUBTHALAMIC STIMULATOR BATTERY REPLACEMENT N/A 08/10/2018   Procedure: Replacement of deep brain stimulator pulse generator/left abdomen with long lead extensions;  Surgeon: Maeola Harman, MD;  Location: [facility] OR;  Service: Neurosurgery;  Laterality: N/A;       Home Medications    Prior to Admission medications   Medication Sig Start Date End Date Taking? Authorizing Provider  doxycycline (VIBRAMYCIN) 100 MG capsule Take 1 capsule (100 mg total) by mouth 2 (two) times daily for 7 days. 05/11/21 05/18/21 Yes Eusebio Friendly B, PA-C  predniSONE (DELTASONE) 20 MG tablet Take 2 tabs PO x 5 day and then 1 tab x 5 days 05/11/21  Yes Eusebio Friendly B, PA-C  albuterol (VENTOLIN HFA) 108 (90 Base) MCG/ACT inhaler INHALE 2 PUFFS INTO THE LUNGS EVERY 4 HOURS AS NEEDED FOR WHEEZING OR SHORTNESS OF BREATH 04/28/21   Reubin Milan, MD  baclofen (LIORESAL) 10 MG tablet TAKE 1 TABLET(10 MG) BY MOUTH THREE TIMES DAILY 09/12/20   Reubin Milan, MD  budesonide (PULMICORT) 0.5 MG/2ML nebulizer solution Inhale into the lungs. 02/11/21 02/11/22  [provider]  fluticasone furoate-vilanterol (BREO ELLIPTA) 100-25 MCG/ACT AEPB Inhale 1 puff into the lungs daily. 04/30/21   Reubin Milan, MD  gabapentin (NEURONTIN) 600 MG tablet TAKE 1 TABLET BY MOUTH EVERY MORNING, 1 TABLET AT NOON, 1 TABLET EVERY EVENING AND 1 TABLET AT BEDTIME 03/21/21   Reubin Milan, MD  ipratropium-albuterol (DUONEB) 0.5-2.5  (3) MG/3ML SOLN Inhale into the lungs. 02/11/21 02/11/22  [provider]  loratadine (CLARITIN) 10 MG tablet Take 1 tablet (10 mg total) by mouth daily. 10/13/17   Enedina Finner, MD  meloxicam (MOBIC) 15 MG tablet TAKE 1 TABLET(15 MG) BY MOUTH DAILY 04/15/21   Reubin Milan, MD  montelukast (SINGULAIR) 10 MG tablet Take 1 tablet (10 mg total) by mouth at bedtime. TAKE 1 TABLET(10 MG) BY MOUTH AT BEDTIME 04/22/21   Reubin Milan, MD  omeprazole (PRILOSEC) 20 MG capsule TAKE 1 CAPSULE BY MOUTH DAILY Patient taking differently: Take 20 mg by mouth daily. 12/05/18   Reubin Milan, MD  OXYGEN Inhale into the lungs. 3 L of oxygen    [provider]  rOPINIRole (REQUIP) 2 MG tablet TAKE 1 TABLET BY MOUTH TWICE DAILY 03/21/21   Reubin Milan, MD  sertraline (ZOLOFT) 50 MG tablet Take 1 tablet (50 mg total) by mouth daily. 02/26/21   Reubin Milan, MD  tamsulosin (FLOMAX) 0.4 MG CAPS capsule  tamsulosin 0.4 mg capsule    [provider]    Family History Family History  Problem Relation Age of Onset   Myoclonus Mother    Breast cancer Mother    Prostate cancer Father    Heart disease Paternal Grandfather    Myoclonus Maternal Uncle     Social History Social History   Tobacco Use   Smoking status: Former    Passive exposure: Past   Smokeless tobacco: Current    Types: Associate Professor Use: Never used  Substance Use Topics   Alcohol use: No    Alcohol/week: 0.0 standard drinks   Drug use: No     Allergies   Penicillins   Review of Systems Review of Systems  Constitutional:  Negative for fatigue and fever.  HENT:  Positive for congestion. Negative for rhinorrhea, sinus pressure, sinus pain and sore throat.   Respiratory:  Positive for cough and wheezing. Negative for shortness of breath.   Cardiovascular:  Negative for chest pain.  Gastrointestinal:  Negative for abdominal pain, diarrhea, nausea and vomiting.  Musculoskeletal:   Negative for myalgias.  Neurological:  Negative for weakness, light-headedness and headaches.  Hematological:  Negative for adenopathy.    Physical Exam Triage Vital Signs ED Triage Vitals  Enc Vitals Group     BP 05/11/21 1159 (!) 179/121     Pulse Rate 05/11/21 1159 100     Resp 05/11/21 1159 (!) 36     Temp 05/11/21 1159 97.6 F (36.4 C)     Temp Source 05/11/21 1159 Oral     SpO2 05/11/21 1159 97 %     Weight 05/11/21 1200 145 lb 15.1 oz (66.2 kg)     Height 05/11/21 1200 6' (1.829 m)     Head Circumference --      Peak Flow --      Pain Score 05/11/21 1200 0     Pain Loc --      Pain Edu? --      Excl. in GC? --    No data found.  Updated Vital Signs BP (!) 179/121 (BP Location: Left Arm)   Pulse 100   Temp 97.6 F (36.4 C) (Oral)   Resp (!) 32   Ht 6' (1.829 m)   Wt 145 lb 15.1 oz (66.2 kg)   SpO2 100%   BMI 19.79 kg/m    Physical Exam Vitals and nursing note reviewed.  Constitutional:      General: He is in acute distress.     Appearance: Normal appearance. He is well-developed. He is ill-appearing.  HENT:     Head: Normocephalic and atraumatic.     Nose: Nose normal.     Mouth/Throat:     Mouth: Mucous membranes are moist.     Pharynx: Oropharynx is clear.  Eyes:     General: No scleral icterus.    Conjunctiva/sclera: Conjunctivae normal.  Cardiovascular:     Rate and Rhythm: Normal rate and regular rhythm.     Heart sounds: Normal heart sounds.  Pulmonary:     Effort: Tachypnea and respiratory distress (moderate distress, speaks 2-3 words and then pauses to catch breath. Prefers sitting in tripod position) present.     Breath sounds: Wheezing (diffuse wheezing throughout all lung fields bilaterally) present.  Musculoskeletal:     Cervical back: Neck supple.  Skin:    General: Skin is warm and dry.  Neurological:     General: No focal deficit present.  Mental Status: He is alert. Mental status is at baseline.     Motor: No weakness.      Coordination: Coordination normal.     Gait: Gait normal.  Psychiatric:        Mood and Affect: Mood normal.        Behavior: Behavior normal.        Thought Content: Thought content normal.     UC Treatments / Results  Labs (all labs ordered are listed, but only abnormal results are displayed) Labs Reviewed  RESP PANEL BY RT-PCR (FLU A&B, COVID) [id number]    EKG   Radiology DG Chest 2 View  Result Date: 05/11/2021 CLINICAL DATA:  Cough and shortness of breath for the past few days. EXAM: CHEST - 2 VIEW COMPARISON:  Chest x-ray dated June 19, 2019. FINDINGS: The heart size and mediastinal contours are within normal limits. The lungs remain hyperinflated. No focal consolidation, pleural effusion, or pneumothorax. No acute osseous abnormality. IMPRESSION: 1. COPD.  No acute cardiopulmonary disease. Electronically Signed   By: Obie Dredge M.D.   On: 05/11/2021 12:40    Procedures Procedures (including critical care time)  Medications Ordered in UC Medications  ipratropium-albuterol (DUONEB) 0.5-2.5 (3) MG/3ML nebulizer solution 3 mL (3 mLs Nebulization Given 05/11/21 1210)  methylPREDNISolone sodium succinate (SOLU-MEDROL) 125 mg/2 mL injection 125 mg (125 mg Intramuscular Given 05/11/21 1230)    Initial Impression / Assessment and Plan / UC Course  I have reviewed the triage vital signs and the nursing notes.  Pertinent labs & imaging results that were available during my care of the patient were reviewed by me and considered in my medical decision making (see chart for details).  65 year old male with history of COPD and asthma presenting for shortness of breath that has worsened over the past few days and especially today.  Patient comes in in acute respiratory distress, unable to speak more than 2-3 words without pausing to take of breath, tripoding.  Use of accessory muscles.  Oxygen is 100% on 3 L of oxygen.  Patient brings his supplemental oxygen in.  He is afebrile.   BP is elevated at 179/121.  He does have diffuse wheezing throughout all lung fields.  Patient placed on our room oxygen at 3 to 4 L.  Patient given DuoNeb treatment.  He is able to speak full sentences after having a DuoNeb treatment.  Patient was given 125 IM Solu-Medrol.  Respiratory panel obtained to assess for influenza and COVID-19.  Chest x-ray performed to assess for possible pneumonia.  Independently viewed the chest x-ray.  Overread shows no evidence of pneumonia, only COPD.  Respiratory panel is negative.  Clinical presentation is consistent with COPD exacerbation.  He improves quite a bit after the DuoNeb treatment.  I did offer another treatment but he says he feels well enough to use his at home medication.  Patient is able to speak in full sentences now.  Wheezing slightly improved.  Very low suspicion for DVT or PE given that he has improved so much and has no history of DVT or PE.  Will treat for COPD exacerbation at this time with doxycycline and prednisone.  Also encouraged him to perform his nebulizer treatments at home.  Advised increasing rest and fluids.  Close monitoring of his condition and if his shortness of breath worsens he knows to call 911 or have someone take him to the emergency department.  Advised following up with PCP.   Final Clinical Impressions(s) / UC  Diagnoses   Final diagnoses:  COPD exacerbation (HCC)  Shortness of breath  Acute cough     Discharge Instructions      -Negative COVID, flu -No pneumonia on your CXR.  Suspect you are having an exacerbation of your COPD. - You have been given a breathing treatment in the clinic and intramuscular steroids.  I have sent prednisone to the pharmacy as well as an antibiotic.  Make sure to take it all. - Increase rest and fluids. - Do you nebulizer 3-4 times a day for and continue to use your Breo Ellipta.  Also continue your supplemental oxygen. - If your breathing worsens or you develop a fever or chest  pain he should call 911 or have something to the emergency department. - Make a follow-up appointment with your PCP.   ED Prescriptions     Medication Sig Dispense Auth. Provider   doxycycline (VIBRAMYCIN) 100 MG capsule Take 1 capsule (100 mg total) by mouth 2 (two) times daily for 7 days. 14 capsule Eusebio Friendly B, PA-C   predniSONE (DELTASONE) 20 MG tablet Take 2 tabs PO x 5 day and then 1 tab x 5 days 15 tablet Shirlee Latch, PA-C      PDMP not reviewed this encounter.   Shirlee Latch, PA-C 05/11/21 1337

## 2021-05-11 NOTE — Discharge Instructions (Signed)
-  Negative COVID, flu -No pneumonia on your CXR.  Suspect you are having an exacerbation of your COPD. - You have been given a breathing treatment in the clinic and intramuscular steroids.  I have sent prednisone to the pharmacy as well as an antibiotic.  Make sure to take it all. - Increase rest and fluids. - Do you nebulizer 3-4 times a day for and continue to use your Breo Ellipta.  Also continue your supplemental oxygen. - If your breathing worsens or you develop a fever or chest pain he should call 911 or have something to the emergency department. - Make a follow-up appointment with your PCP.

## 2021-05-11 NOTE — ED Triage Notes (Signed)
Cough and shortness of breath for past few days. Worsening today. Pt having difficulty speaking in full sentences. On home O2 at 3L

## 2021-05-15 DIAGNOSIS — J4540 Moderate persistent asthma, uncomplicated: Secondary | ICD-10-CM | POA: Diagnosis not present

## 2021-05-15 DIAGNOSIS — J45909 Unspecified asthma, uncomplicated: Secondary | ICD-10-CM | POA: Diagnosis not present

## 2021-05-15 DIAGNOSIS — R1310 Dysphagia, unspecified: Secondary | ICD-10-CM | POA: Diagnosis not present

## 2021-05-15 DIAGNOSIS — R0602 Shortness of breath: Secondary | ICD-10-CM | POA: Diagnosis not present

## 2021-05-21 DIAGNOSIS — J45909 Unspecified asthma, uncomplicated: Secondary | ICD-10-CM | POA: Diagnosis not present

## 2021-05-21 DIAGNOSIS — J449 Chronic obstructive pulmonary disease, unspecified: Secondary | ICD-10-CM | POA: Diagnosis not present

## 2021-05-21 DIAGNOSIS — I10 Essential (primary) hypertension: Secondary | ICD-10-CM | POA: Diagnosis not present

## 2021-05-28 DIAGNOSIS — Z01812 Encounter for preprocedural laboratory examination: Secondary | ICD-10-CM | POA: Diagnosis not present

## 2021-05-28 DIAGNOSIS — Z20822 Contact with and (suspected) exposure to covid-19: Secondary | ICD-10-CM | POA: Diagnosis not present

## 2021-06-11 DIAGNOSIS — M182 Bilateral post-traumatic osteoarthritis of first carpometacarpal joints: Secondary | ICD-10-CM | POA: Diagnosis not present

## 2021-06-19 ENCOUNTER — Other Ambulatory Visit: Payer: Self-pay | Admitting: Internal Medicine

## 2021-06-19 DIAGNOSIS — G250 Essential tremor: Secondary | ICD-10-CM

## 2021-06-19 NOTE — Telephone Encounter (Signed)
Requested Prescriptions  Pending Prescriptions Disp Refills   rOPINIRole (REQUIP) 2 MG tablet [Pharmacy Med Name: ROPINIROLE 2MG  TABLETS] 180 tablet 0    Sig: TAKE 1 TABLET BY MOUTH TWICE DAILY     Neurology:  Parkinsonian Agents Failed - 06/19/2021  6:19 AM      Failed - Last BP in normal range    BP Readings from Last 1 Encounters:  05/11/21 (!) 179/121         Passed - Valid encounter within last 12 months    Recent Outpatient Visits          1 month ago Annual physical exam   [facility], MD   3 months ago Current moderate episode of major depressive disorder without prior episode [facility])   [facility], MD   5 months ago Reducible right inguinal hernia   [facility], MD   11 months ago Urgency of urination   [facility], MD   1 year ago Annual physical exam   [facility], MD      Future Appointments            In 10 months Reubin Milan, MD [facility]            gabapentin (NEURONTIN) 600 MG tablet [Pharmacy Med Name: GABAPENTIN 600MG  TABLETS] 360 tablet 0    Sig: TAKE 1 TABLET BY MOUTH EVERY MORNING, 1 TABLET AT NOON, 1 TABLET EVERY EVENING AND 1 TABLET AT BEDTIME     Neurology: Anticonvulsants - gabapentin Passed - 06/19/2021  6:19 AM      Passed - Valid encounter within last 12 months    Recent Outpatient Visits          1 month ago Annual physical exam   [facility] 06/21/[facility], MD   3 months ago Current moderate episode of major depressive disorder without prior episode [facility])   [facility] Reubin Milan, MD   5 months ago Reducible right inguinal hernia   [facility] Reubin Milan, MD   11 months ago Urgency of urination   [facility] Reubin Milan, MD   1 year ago Annual physical exam   [facility] Reubin Milan, MD       Future Appointments            In 10 months [facility] Reubin Milan, MD [facility]

## 2021-07-03 ENCOUNTER — Other Ambulatory Visit: Payer: Self-pay

## 2021-07-03 ENCOUNTER — Encounter: Payer: Self-pay | Admitting: Emergency Medicine

## 2021-07-03 ENCOUNTER — Ambulatory Visit
Admission: EM | Admit: 2021-07-03 | Discharge: 2021-07-03 | Disposition: A | Discharge status: Home / Self Care | Payer: Medicare Other | Attending: Medical Oncology | Admitting: Medical Oncology

## 2021-07-03 DIAGNOSIS — Z1152 Encounter for screening for COVID-19: Secondary | ICD-10-CM | POA: Diagnosis not present

## 2021-07-03 DIAGNOSIS — Z01812 Encounter for preprocedural laboratory examination: Secondary | ICD-10-CM | POA: Diagnosis not present

## 2021-07-03 DIAGNOSIS — Z20822 Contact with and (suspected) exposure to covid-19: Secondary | ICD-10-CM | POA: Diagnosis not present

## 2021-07-03 NOTE — ED Triage Notes (Signed)
PT needs covid test for surgery scheduled Friday AM. No symptoms

## 2021-07-04 LAB — SARS CORONAVIRUS 2 (TAT 6-24 HRS): SARS Coronavirus 2: NEGATIVE

## 2021-07-05 DIAGNOSIS — I10 Essential (primary) hypertension: Secondary | ICD-10-CM | POA: Diagnosis not present

## 2021-07-05 DIAGNOSIS — Z7952 Long term (current) use of systemic steroids: Secondary | ICD-10-CM | POA: Diagnosis not present

## 2021-07-05 DIAGNOSIS — K4091 Unilateral inguinal hernia, without obstruction or gangrene, recurrent: Secondary | ICD-10-CM | POA: Diagnosis not present

## 2021-07-05 DIAGNOSIS — Z791 Long term (current) use of non-steroidal anti-inflammatories (NSAID): Secondary | ICD-10-CM | POA: Diagnosis not present

## 2021-07-05 DIAGNOSIS — Z79899 Other long term (current) drug therapy: Secondary | ICD-10-CM | POA: Diagnosis not present

## 2021-07-05 DIAGNOSIS — F17220 Nicotine dependence, chewing tobacco, uncomplicated: Secondary | ICD-10-CM | POA: Diagnosis not present

## 2021-07-05 DIAGNOSIS — Z880 Allergy status to penicillin: Secondary | ICD-10-CM | POA: Diagnosis not present

## 2021-07-05 DIAGNOSIS — J449 Chronic obstructive pulmonary disease, unspecified: Secondary | ICD-10-CM | POA: Diagnosis not present

## 2021-07-05 HISTORY — PX: INGUINAL HERNIA REPAIR: SHX194

## 2021-07-06 DIAGNOSIS — F17220 Nicotine dependence, chewing tobacco, uncomplicated: Secondary | ICD-10-CM | POA: Diagnosis not present

## 2021-07-06 DIAGNOSIS — Z880 Allergy status to penicillin: Secondary | ICD-10-CM | POA: Diagnosis not present

## 2021-07-06 DIAGNOSIS — J449 Chronic obstructive pulmonary disease, unspecified: Secondary | ICD-10-CM | POA: Diagnosis not present

## 2021-07-06 DIAGNOSIS — Z791 Long term (current) use of non-steroidal anti-inflammatories (NSAID): Secondary | ICD-10-CM | POA: Diagnosis not present

## 2021-07-06 DIAGNOSIS — I10 Essential (primary) hypertension: Secondary | ICD-10-CM | POA: Diagnosis not present

## 2021-07-06 DIAGNOSIS — Z79899 Other long term (current) drug therapy: Secondary | ICD-10-CM | POA: Diagnosis not present

## 2021-07-06 DIAGNOSIS — K4091 Unilateral inguinal hernia, without obstruction or gangrene, recurrent: Secondary | ICD-10-CM | POA: Diagnosis not present

## 2021-07-06 DIAGNOSIS — Z7952 Long term (current) use of systemic steroids: Secondary | ICD-10-CM | POA: Diagnosis not present

## 2021-07-16 DIAGNOSIS — M189 Osteoarthritis of first carpometacarpal joint, unspecified: Secondary | ICD-10-CM | POA: Diagnosis not present

## 2021-07-17 ENCOUNTER — Other Ambulatory Visit: Payer: Self-pay

## 2021-07-17 ENCOUNTER — Encounter: Payer: Commercial Managed Care - HMO | Attending: Pulmonary Disease | Admitting: *Deleted

## 2021-07-17 ENCOUNTER — Encounter: Payer: Self-pay | Admitting: *Deleted

## 2021-07-17 DIAGNOSIS — J4540 Moderate persistent asthma, uncomplicated: Secondary | ICD-10-CM

## 2021-07-17 NOTE — Progress Notes (Signed)
Virtual orientation call completed today. he has an appointment on Date: 07/31/2021  for EP eval and gym Orientation.  Documentation of diagnosis can be found in [facility]  Date: 05/15/2021 .

## 2021-07-31 ENCOUNTER — Encounter: Payer: Commercial Managed Care - HMO | Attending: Pulmonary Disease

## 2021-08-22 DIAGNOSIS — Z20822 Contact with and (suspected) exposure to covid-19: Secondary | ICD-10-CM | POA: Diagnosis not present

## 2021-08-22 DIAGNOSIS — R062 Wheezing: Secondary | ICD-10-CM | POA: Diagnosis not present

## 2021-08-22 DIAGNOSIS — R0602 Shortness of breath: Secondary | ICD-10-CM | POA: Diagnosis not present

## 2021-08-22 DIAGNOSIS — R053 Chronic cough: Secondary | ICD-10-CM | POA: Diagnosis not present

## 2021-08-22 DIAGNOSIS — R0981 Nasal congestion: Secondary | ICD-10-CM | POA: Diagnosis not present

## 2021-08-22 DIAGNOSIS — J441 Chronic obstructive pulmonary disease with (acute) exacerbation: Secondary | ICD-10-CM | POA: Diagnosis not present

## 2021-08-22 DIAGNOSIS — Z880 Allergy status to penicillin: Secondary | ICD-10-CM | POA: Diagnosis not present

## 2021-08-22 DIAGNOSIS — Z79899 Other long term (current) drug therapy: Secondary | ICD-10-CM | POA: Diagnosis not present

## 2021-08-30 IMAGING — CR DG FINGER THUMB 2+V*L*
3 series · 3 of 3 positions shown · non-contrast
Comparison: None.

CLINICAL DATA: Left thumb pain. Remote thumb injury, now enlarged
joint and painful.

EXAM:
LEFT THUMB 2+V

[finger ap]
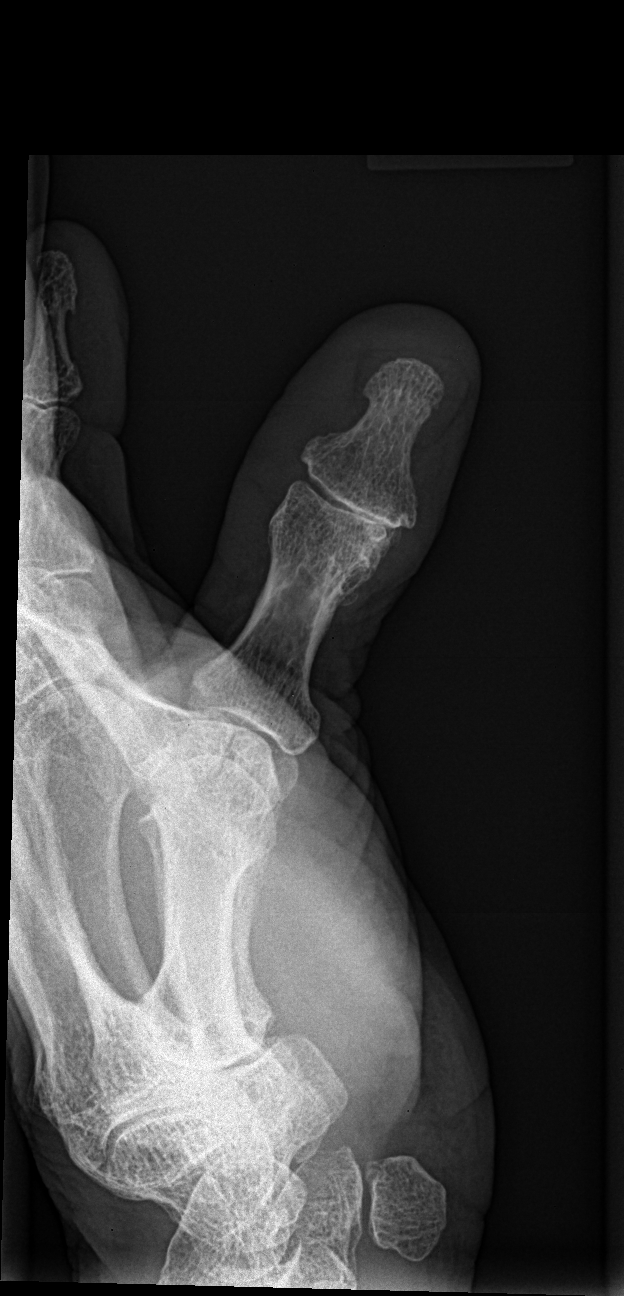

[finger obl]
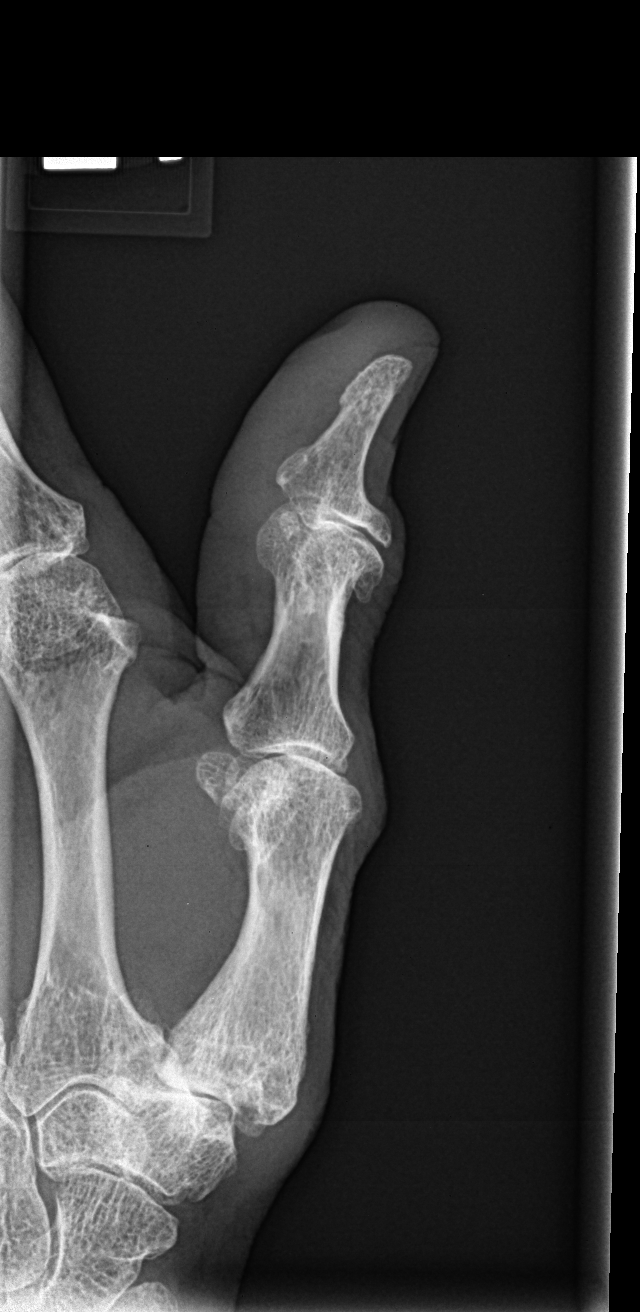

[finger lat]
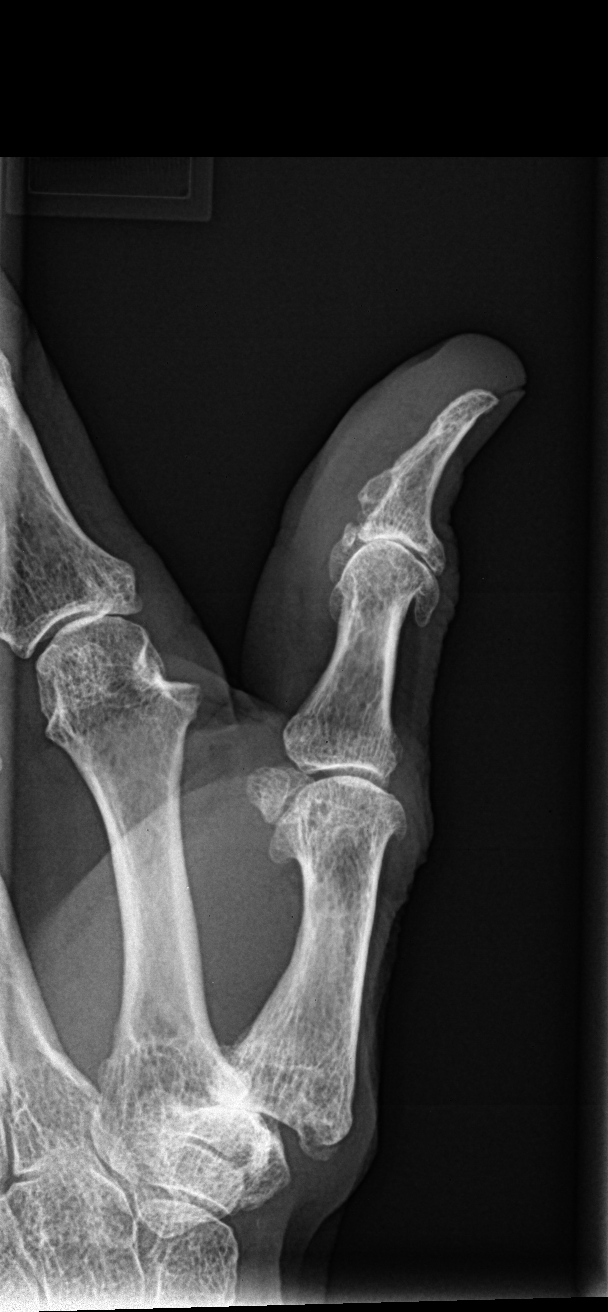

[3 of 3 positions shown; findings below may reference images not displayed]

FINDINGS: No acute or evident healed fracture. Joint space narrowing of the
interphalangeal joint with mild subchondral cystic change and
prominent osteophytes. Fragmented osteophyte versus accessory
ossicle about the volar surface. Mild osteoarthritis of the thumb
metacarpal phalangeal joint as well as carpal metacarpal joint with
joint space narrowing and subchondral cystic change. No erosion. No
soft tissue calcifications.
IMPRESSION: Moderate osteoarthritis of the interphalangeal joint. Mild
osteoarthritis of the metacarpophalangeal joint and carpometacarpal
joint. No evidence of inflammatory arthropathy.

## 2021-09-17 ENCOUNTER — Other Ambulatory Visit: Payer: Self-pay | Admitting: Internal Medicine

## 2021-09-17 DIAGNOSIS — G250 Essential tremor: Secondary | ICD-10-CM

## 2021-09-18 NOTE — Telephone Encounter (Signed)
Requested Prescriptions  ?Pending Prescriptions Disp Refills  ?? rOPINIRole (REQUIP) 2 MG tablet [Pharmacy Med Name: ROPINIROLE 2MG  TABLETS] 180 tablet 2  ?  Sig: TAKE 1 TABLET BY MOUTH TWICE DAILY  ?  ? Neurology:  Parkinsonian Agents Failed - 09/17/2021  6:19 AM  ?  ?  Failed - Last BP in normal range  ?  BP Readings from Last 1 Encounters:  ?07/03/21 (!) 146/105  ?   ?  ?  Passed - Last Heart Rate in normal range  ?  Pulse Readings from Last 1 Encounters:  ?07/03/21 96  ?   ?  ?  Passed - Valid encounter within last 12 months  ?  Recent Outpatient Visits   ?      ? 4 months ago Annual physical exam  ? [facility], MD  ? 6 months ago Current moderate episode of major depressive disorder without prior episode [facility])  ? [facility], MD  ? 8 months ago Reducible right inguinal hernia  ? [facility], MD  ? 1 year ago Urgency of urination  ? [facility], MD  ? 1 year ago Annual physical exam  ? [facility], MD  ?  ?  ?Future Appointments   ?        ? In 7 months Reubin Milan Judithann Graves, MD [facility]  ?  ? ?  ?  ?  ?? gabapentin (NEURONTIN) 600 MG tablet [Pharmacy Med Name: GABAPENTIN 600MG  TABLETS] 360 tablet 2  ?  Sig: TAKE 1 TABLET BY MOUTH EVERY MORNING, 1 TABLET AT NOON, 1 TABLET EVERY EVENING AND 1 TABLET EVERY NIGHT AT BEDTIME  ?  ? Neurology: Anticonvulsants - gabapentin Passed - 09/17/2021  6:19 AM  ?  ?  Passed - Cr in normal range and within 360 days  ?  Creat  ?Date Value Ref Range Status  ?04/16/2018 0.76 0.70 - 1.25 mg/dL Final  ?  Comment:  ?  For patients >51 years of age, the reference limit ?for Creatinine is approximately 13% higher for people ?identified as African-American. ?. ?  ? ?Creatinine, Ser  ?Date Value Ref Range Status  ?04/22/2021 0.77 0.61 - 1.24 mg/dL Final  ?   ?  ?  Passed - Completed PHQ-2 or PHQ-9 in the last 360 days  ?  ?  Passed -  Valid encounter within last 12 months  ?  Recent Outpatient Visits   ?      ? 4 months ago Annual physical exam  ? [facility] 04/24/2021, MD  ? 6 months ago Current moderate episode of major depressive disorder without prior episode [facility])  ? [facility], MD  ? 8 months ago Reducible right inguinal hernia  ? [facility] Reubin Milan, MD  ? 1 year ago Urgency of urination  ? [facility] Reubin Milan, MD  ? 1 year ago Annual physical exam  ? [facility] Reubin Milan, MD  ?  ?  ?Future Appointments   ?        ? In 7 months [facility] Reubin Milan, MD [facility]  ?  ? ?  ?  ?  ? ?

## 2021-10-03 ENCOUNTER — Other Ambulatory Visit: Payer: Self-pay

## 2021-10-03 ENCOUNTER — Emergency Department
Admission: EM | Admit: 2021-10-03 | Discharge: 2021-10-03 | Disposition: A | Discharge status: Home / Self Care | Payer: Medicare Other | Attending: Emergency Medicine | Admitting: Emergency Medicine

## 2021-10-03 ENCOUNTER — Emergency Department: Payer: Medicare Other

## 2021-10-03 ENCOUNTER — Encounter: Payer: Self-pay | Admitting: Emergency Medicine

## 2021-10-03 DIAGNOSIS — Z20822 Contact with and (suspected) exposure to covid-19: Secondary | ICD-10-CM | POA: Diagnosis not present

## 2021-10-03 DIAGNOSIS — J441 Chronic obstructive pulmonary disease with (acute) exacerbation: Secondary | ICD-10-CM | POA: Insufficient documentation

## 2021-10-03 DIAGNOSIS — R059 Cough, unspecified: Secondary | ICD-10-CM | POA: Diagnosis not present

## 2021-10-03 DIAGNOSIS — R0603 Acute respiratory distress: Secondary | ICD-10-CM

## 2021-10-03 DIAGNOSIS — Z743 Need for continuous supervision: Secondary | ICD-10-CM | POA: Diagnosis not present

## 2021-10-03 DIAGNOSIS — R0602 Shortness of breath: Secondary | ICD-10-CM | POA: Diagnosis not present

## 2021-10-03 DIAGNOSIS — R0689 Other abnormalities of breathing: Secondary | ICD-10-CM | POA: Diagnosis not present

## 2021-10-03 DIAGNOSIS — I10 Essential (primary) hypertension: Secondary | ICD-10-CM | POA: Insufficient documentation

## 2021-10-03 DIAGNOSIS — R6889 Other general symptoms and signs: Secondary | ICD-10-CM | POA: Diagnosis not present

## 2021-10-03 LAB — BLOOD GAS, VENOUS
Acid-Base Excess: 1.7 mmol/L (ref 0.0–2.0)
Bicarbonate: 28.2 mmol/L — ABNORMAL HIGH (ref 20.0–28.0)
O2 Saturation: 60.3 %
Patient temperature: 37
pCO2, Ven: 51 mmHg (ref 44–60)
pH, Ven: 7.35 (ref 7.25–7.43)
pO2, Ven: 35 mmHg (ref 32–45)

## 2021-10-03 LAB — RESP PANEL BY RT-PCR (FLU A&B, COVID) ARPGX2
Influenza A by PCR: NEGATIVE
Influenza B by PCR: NEGATIVE
SARS Coronavirus 2 by RT PCR: NEGATIVE

## 2021-10-03 LAB — COMPREHENSIVE METABOLIC PANEL
ALT: 15 U/L (ref 0–44)
AST: 25 U/L (ref 15–41)
Albumin: 3.9 g/dL (ref 3.5–5.0)
Alkaline Phosphatase: 74 U/L (ref 38–126)
Anion gap: 10 (ref 5–15)
BUN: 12 mg/dL (ref 8–23)
CO2: 25 mmol/L (ref 22–32)
Calcium: 10.5 mg/dL — ABNORMAL HIGH (ref 8.9–10.3)
Chloride: 105 mmol/L (ref 98–111)
Creatinine, Ser: 0.9 mg/dL (ref 0.61–1.24)
GFR, Estimated: >60 mL/min (ref >60)
Glucose, Bld: 112 mg/dL — ABNORMAL HIGH (ref 70–99)
Potassium: 4.1 mmol/L (ref 3.5–5.1)
Sodium: 140 mmol/L (ref 135–145)
Total Bilirubin: 1.1 mg/dL (ref 0.3–1.2)
Total Protein: 7.4 g/dL (ref 6.5–8.1)

## 2021-10-03 LAB — CBC WITH DIFFERENTIAL/PLATELET
Abs Immature Granulocytes: 0.04 10*3/uL (ref 0.00–0.07)
Basophils Absolute: 0.1 10*3/uL (ref 0.0–0.1)
Basophils Relative: 1 %
Eosinophils Absolute: 1 10*3/uL — ABNORMAL HIGH (ref 0.0–0.5)
Eosinophils Relative: 9 %
HCT: 48.1 % (ref 39.0–52.0)
Hemoglobin: 15.7 g/dL (ref 13.0–17.0)
Immature Granulocytes: 0 %
Lymphocytes Relative: 24 %
Lymphs Abs: 2.8 10*3/uL (ref 0.7–4.0)
MCH: 29 pg (ref 26.0–34.0)
MCHC: 32.6 g/dL (ref 30.0–36.0)
MCV: 88.7 fL (ref 80.0–100.0)
Monocytes Absolute: 0.8 10*3/uL (ref 0.1–1.0)
Monocytes Relative: 7 %
Neutro Abs: 6.8 10*3/uL (ref 1.7–7.7)
Neutrophils Relative %: 59 %
Platelets: 222 10*3/uL (ref 150–400)
RBC: 5.42 MIL/uL (ref 4.22–5.81)
RDW: 12.9 % (ref 11.5–15.5)
WBC: 11.6 10*3/uL — ABNORMAL HIGH (ref 4.0–10.5)
nRBC: 0 % (ref 0.0–0.2)

## 2021-10-03 MED ORDER — IPRATROPIUM-ALBUTEROL 0.5-2.5 (3) MG/3ML IN SOLN
3.0000 mL | Freq: Once | RESPIRATORY_TRACT | Status: AC
  Administered 2021-10-03: 3 mL via RESPIRATORY_TRACT
  Filled 2021-10-03: qty 3

## 2021-10-03 MED ORDER — METHYLPREDNISOLONE SODIUM SUCC 125 MG IJ SOLR
125.0000 mg | Freq: Once | INTRAMUSCULAR | Status: AC
  Administered 2021-10-03: 125 mg via INTRAVENOUS
  Filled 2021-10-03: qty 2

## 2021-10-03 MED ORDER — PREDNISONE 10 MG (21) PO TBPK: ORAL_TABLET | ORAL | 0 refills | Status: DC

## 2021-10-03 MED ORDER — DOXYCYCLINE MONOHYDRATE 100 MG PO TABS: 100.0000 mg | ORAL_TABLET | Freq: Two times a day (BID) | ORAL | 0 refills | Status: DC

## 2021-10-03 NOTE — Discharge Instructions (Signed)
Follow-up with your regular doctor if not improving in 1 day.  Return emergency department for worsening.  Use medications as prescribed.  We do recommend that you stay in the hospital, however since she want to leave you should definitely return if you are worsening.  Please call 911 if you feel that your breathing has worsened. ?

## 2021-10-03 NOTE — ED Triage Notes (Signed)
BIB EMS from home ?3l o2 at home  ?90% on 3l at home labored breathing ?Coughing up yellow  ?18lfa ?1 albuterol tx ?2 duoneb ?125 solumedrol ? ?EMS VS  ?HR 110  ?100% on 8l after treatments ?Cap25 ?Rr 20 ?150/70 ?

## 2021-10-03 NOTE — ED Provider Notes (Signed)
? ?[facility] ?Provider Note ? ? ? Event Date/Time  ? First MD Initiated Contact with Patient 10/03/21 1233   ?  (approximate) ? ? ?History  ? ?Shortness of Breath ? ? ?HPI ? ?David Rhodes is a 66 y.o. male with history of COPD, hypertension, and essential tremor presents emergency department in respiratory distress.  Patient states that he has been using his inhalers as prescribed.  Wears 3 L of O2 nasal cannula at home.  Patient's had more difficulty breathing.  Started over the past week.  Got much worse today.  Denies fever/chills. ? ?  ? ? ?Physical Exam  ? ?Triage Vital Signs: ?ED Triage Vitals  ?Enc Vitals Group  ?   BP 10/03/21 1232 (!) 152/99  ?   Pulse Rate 10/03/21 1232 (!) 106  ?   Resp 10/03/21 1232 (!) 24  ?   Temp --   ?   Temp src --   ?   SpO2 10/03/21 1231 98 %  ?   Weight --   ?   Height --   ?   Head Circumference --   ?   Peak Flow --   ?   Pain Score --   ?   Pain Loc --   ?   Pain Edu? --   ?   Excl. in GC? --   ? ? ?Most recent vital signs: ?Vitals:  ? 10/03/21 1308 10/03/21 1330  ?BP: (!) 127/91 125/86  ?Pulse: 92 87  ?Resp: 20 (!) 22  ?SpO2: 100% 99%  ? ? ? ?General: Awake, no distress.   ?CV:  Good peripheral perfusion tachycardic ?Resp:  Normal effort. Lungs decreased breath sounds throughout all, wheezing noted, accessory muscle use ?Abd:  No distention.   ?Other:    ? ? ?ED Results / Procedures / Treatments  ? ?Labs ?(all labs ordered are listed, but only abnormal results are displayed) ?Labs Reviewed  ?CBC WITH DIFFERENTIAL/PLATELET - Abnormal; Notable for the following components:  ?    Result Value  ? WBC 11.6 (*)   ? Eosinophils Absolute 1.0 (*)   ? All other components within normal limits  ?COMPREHENSIVE METABOLIC PANEL - Abnormal; Notable for the following components:  ? Glucose, Bld 112 (*)   ? Calcium 10.5 (*)   ? All other components within normal limits  ?BLOOD GAS, VENOUS - Abnormal; Notable for the following components:  ? Bicarbonate 28.2 (*)   ?  All other components within normal limits  ?RESP PANEL BY RT-PCR (FLU A&B, COVID) [id number]  ? ? ? ?EKG ? ? ? ? ?RADIOLOGY ?Chest x-ray ? ? ? ?PROCEDURES: ? ? ?Marland KitchenCritical Care E&M ?Performed by: Faythe Ghee, PA-C ? ?Critical care provider statement:  ?  Critical care time (minutes):  45 ?  Critical care time was exclusive of:  Separately billable procedures and treating other patients ?  Critical care was necessary to treat or prevent imminent or life-threatening deterioration of the following conditions:  Respiratory failure ?  Critical care was time spent personally by me on the following activities:  Blood draw for specimens, development of treatment plan with patient or surrogate, evaluation of patient's response to treatment, examination of patient, obtaining history from patient or surrogate, ordering and performing treatments and interventions, ordering and review of laboratory studies, ordering and review of radiographic studies, pulse oximetry, re-evaluation of patient's condition and review of old charts ?After initial E/M assessment, critical care services were subsequently performed that  were exclusive of separately billable procedures or treatment.  ? ? ? ?MEDICATIONS ORDERED IN ED: ?Medications  ?ipratropium-albuterol (DUONEB) 0.5-2.5 (3) MG/3ML nebulizer solution 3 mL (3 mLs Nebulization Given 10/03/21 1305)  ?ipratropium-albuterol (DUONEB) 0.5-2.5 (3) MG/3ML nebulizer solution 3 mL (3 mLs Nebulization Given 10/03/21 1305)  ?ipratropium-albuterol (DUONEB) 0.5-2.5 (3) MG/3ML nebulizer solution 3 mL (3 mLs Nebulization Given 10/03/21 1305)  ?methylPREDNISolone sodium succinate (SOLU-MEDROL) 125 mg/2 mL injection 125 mg (125 mg Intravenous Given 10/03/21 1305)  ? ? ? ?IMPRESSION / MDM / ASSESSMENT AND PLAN / ED COURSE  ?I reviewed the triage vital signs and the nursing notes. ?             ?               ? ?Differential diagnosis includes, but is not limited to, acute respiratory distress, COPD exasperation,  CAP, COVID, PE ? ?Patient was given DuoNeb x3, Solu-Medrol 125 mg IV, labs and imaging ordered ? ?Venous blood gas is normal ?Patient oxygen saturation has remained at 98% on 3 L nasal cannula. ? ?Patient had some relief and feels that he is breathing easier after the 3 DuoNebs.  Patient has continued diffuse wheezing.  Did discuss admission with the patient.  At this time he would rather go home.  I did explain to the patient that it was important for him to stay that he may go home and get worse.  That this could be a fatal respiratory distress if he returns home.  Patient would still like to go home.  We will have Dr. Katrinka Blazing evaluate the patient and discuss with him. ? ?Dr. Katrinka Blazing and see the patient.  He also reinforced concerns about the patient going home.  Did tell him this could be life-threatening and he could worsen.  Patient still would like to go home.  He was discharged with a Sterapred pack and doxycycline. ? ? ? ?  ? ? ?FINAL CLINICAL IMPRESSION(S) / ED DIAGNOSES  ? ?Final diagnoses:  ?COPD exacerbation (HCC)  ?Acute respiratory distress  ? ? ? ?Rx / DC Orders  ? ?ED Discharge Orders   ? ?      Ordered  ?  doxycycline (ADOXA) 100 MG tablet  2 times daily       ? 10/03/21 1357  ?  predniSONE (STERAPRED UNI-PAK 21 TAB) 10 MG (21) TBPK tablet       ? 10/03/21 1357  ? ?  ?  ? ?  ? ? ? ?Note:  This document was prepared using Dragon voice recognition software and may include unintentional dictation errors. ? ?  ?Faythe Ghee, PA-C ?10/03/21 1359 ? ?  ?Gilles Chiquito, MD ?10/03/21 1638 ? ?

## 2021-10-03 NOTE — ED Notes (Signed)
Dc instructions and scripts reviewed with pt. Admission discussed with pt but pt stated he wanted to go home. Pt was wheeled to lobby with personal portable oxygen to wait for friend to transport home.denies any questions or concerns at this time.  ?

## 2021-10-21 ENCOUNTER — Encounter: Payer: Self-pay | Admitting: Internal Medicine

## 2021-10-21 ENCOUNTER — Ambulatory Visit (INDEPENDENT_AMBULATORY_CARE_PROVIDER_SITE_OTHER): Payer: Medicare Other | Admitting: Internal Medicine

## 2021-10-21 VITALS — BP 122/84 | HR 67 | Ht 72.0 in | Wt 149.0 lb

## 2021-10-21 DIAGNOSIS — I10 Essential (primary) hypertension: Secondary | ICD-10-CM

## 2021-10-21 DIAGNOSIS — M1812 Unilateral primary osteoarthritis of first carpometacarpal joint, left hand: Secondary | ICD-10-CM | POA: Diagnosis not present

## 2021-10-21 DIAGNOSIS — J4550 Severe persistent asthma, uncomplicated: Secondary | ICD-10-CM

## 2021-10-21 DIAGNOSIS — F321 Major depressive disorder, single episode, moderate: Secondary | ICD-10-CM

## 2021-10-21 NOTE — Patient Instructions (Addendum)
Vigeland, Danton Clap, MD   ?[address]   ?[address]   ?[place], [place] [address]   ?[phone] (Work)   ?[phone] (Fax)   ?  ?Kellie Shropshire, MD   ?[address]   ?[place], [place] [address]   ?[phone] (Work)   ?[phone] (Fax)   ? ?Check to see if you are taking Sertraline 50 mg. ?

## 2021-10-21 NOTE — Progress Notes (Signed)
? ? ?Date:  10/21/2021  ? ?Name:  David BienenstockRonald E Rhodes   DOB:  May 01, 1956   MRN:  [id number] ? ? ?Chief Complaint: Hypertension, Depression, and Asthma ? ?Hypertension ?This is a chronic problem. The problem is controlled. Associated symptoms include shortness of breath. Pertinent negatives include no chest pain, headaches or palpitations. Compliance problems include diet.   ?Depression ?       This is a chronic problem.The problem is unchanged.  Associated symptoms include fatigue.  Associated symptoms include no appetite change and no headaches.     The symptoms are aggravated by social issues.  Past treatments include SSRIs - Selective serotonin reuptake inhibitors (sertraline in the past - he is not sure if he is taking it at this time). ?Asthma ?He complains of chest tightness, difficulty breathing, shortness of breath, sputum production and wheezing. This is a chronic problem. The problem occurs constantly. Pertinent negatives include no appetite change, chest pain, fever or headaches. His symptoms are aggravated by any activity. His symptoms are alleviated by beta-agonist, leukotriene antagonist and steroid inhaler. He reports minimal improvement on treatment. His past medical history is significant for asthma. Past medical history comments: He has had an ED visit every month so far this year.Marland Kitchen.  ?Thumb pain - he was seen by [facility] in January.  They advised that he needed surgery but then decided that he was too high risk.  The told him they were going to refer him to someone else but he has never heard back. ? ?Lab Results  ?Component Value Date  ? NA 140 10/03/2021  ? K 4.1 10/03/2021  ? CO2 25 10/03/2021  ? GLUCOSE 112 (H) 10/03/2021  ? BUN 12 10/03/2021  ? CREATININE 0.90 10/03/2021  ? CALCIUM 10.5 (H) 10/03/2021  ? GFRNONAA >60 10/03/2021  ? ?Lab Results  ?Component Value Date  ? CHOL 119 04/22/2021  ? HDL 59 04/22/2021  ? LDLCALC 52 04/22/2021  ? TRIG 41 04/22/2021  ? CHOLHDL 2.0 04/22/2021  ? ?Lab  Results  ?Component Value Date  ? TSH 1.019 04/22/2021  ? ?Lab Results  ?Component Value Date  ? HGBA1C 5.3 12/25/2018  ? ?Lab Results  ?Component Value Date  ? WBC 11.6 (H) 10/03/2021  ? HGB 15.7 10/03/2021  ? HCT 48.1 10/03/2021  ? MCV 88.7 10/03/2021  ? PLT 222 10/03/2021  ? ?Lab Results  ?Component Value Date  ? ALT 15 10/03/2021  ? AST 25 10/03/2021  ? ALKPHOS 74 10/03/2021  ? BILITOT 1.1 10/03/2021  ? ?No results found for: 25OHVITD2, 25OHVITD3, VD25OH  ? ?Review of Systems  ?Constitutional:  Positive for fatigue. Negative for appetite change, diaphoresis, fever and unexpected weight change.  ?Respiratory:  Positive for sputum production, chest tightness, shortness of breath and wheezing.   ?Cardiovascular:  Negative for chest pain, palpitations and leg swelling.  ?Musculoskeletal:  Positive for arthralgias and joint swelling.  ?Neurological:  Positive for tremors. Negative for dizziness, syncope and headaches.  ?Psychiatric/Behavioral:  Positive for depression, dysphoric mood and sleep disturbance. The patient is not nervous/anxious.   ? ?Patient Active Problem List  ? Diagnosis Date Noted  ? Current moderate episode of major depressive disorder without prior episode (HCC) 02/26/2021  ? Thumb pain, left 02/26/2021  ? 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 Dependence on supplemental oxygen 04/18/2020  ? Leukocytosis 04/15/2019  ? Inguinal hernia recurrent bilateral 11/16/2018  ? Chronic left shoulder pain 10/05/2018  ? PICC (peripherally inserted central catheter) in place 04/16/2018  ?  Essential tremor 10/18/2017  ? Cervical radiculopathy 07/23/2017  ? Bilateral temporomandibular joint pain 07/23/2017  ? Neck pain 05/19/2017  ? Medication monitoring encounter 09/11/2016  ? Pulmonary nodules 12/31/2015  ? COPD (chronic obstructive pulmonary disease) (HCC) 12/27/2015  ? Chronic cough 12/27/2015  ? Asthma 09/24/2015  ? Allergic rhinitis 11/11/2014  ? Nerve root pain 11/11/2014  ? Dyskinesia  11/11/2014  ? Acid reflux 11/11/2014  ? Calcium blood increased 11/11/2014  ? Affective disorder (HCC) 11/11/2014  ? Essential hypertension 12/25/2011  ? Chronic back pain 12/25/2011  ? ? ?Allergies  ?Allergen Reactions  ? Penicillins Shortness Of Breath  ?  PATIENT HAS HAD A PCN REACTION WITH IMMEDIATE RASH, FACIAL/TONGUE/THROAT SWELLING, SOB, OR LIGHTHEADEDNESS WITH HYPOTENSION:  #  #  YES  #  #  ?Has patient had a PCN reaction causing severe rash involving mucus membranes or skin necrosis: No ?Has patient had a PCN reaction that required hospitalization No ?Has patient had a PCN reaction occurring within the last 10 years: No ?If all of the above answers are "NO", then may proceed with Cephalosporin use. ?**tolerated cefazolin and cephalexin 03/2017  ? ? ?Past Surgical History:  ?Procedure Laterality Date  ? DEEP BRAIN STIMULATOR PLACEMENT    ? INGUINAL HERNIA REPAIR Bilateral 02/07/2014  ? Procedure:  BILATERAL INGUINAL HERNIA REPAIR;  Surgeon: Axel Filler, MD;  Location: [facility] OR;  Service: General;  Laterality: Bilateral;  ? INSERTION OF MESH Bilateral 02/07/2014  ? Procedure: INSERTION OF MESH;  Surgeon: Axel Filler, MD;  Location: [facility] OR;  Service: General;  Laterality: Bilateral;  ? NASAL SINUS SURGERY    ? PULSE GENERATOR IMPLANT Left 09/11/2016  ? Procedure: Left chest-Change implantable pulse generator battery;  Surgeon: Maeola Harman, MD;  Location: [facility] OR;  Service: Neurosurgery;  Laterality: Left;  Left chest-Change implantable pulse generator battery  ? PULSE GENERATOR IMPLANT Left 04/06/2018  ? Procedure: Revision of left chest implantable pulse generator, extension, and pocket adapter;  Surgeon: Maeola Harman, MD;  Location: [facility];  Service: Neurosurgery;  Laterality: Left;  ? SUBTHALAMIC STIMULATOR BATTERY REPLACEMENT  12/09/2011  ? Procedure: SUBTHALAMIC STIMULATOR BATTERY REPLACEMENT;  Surgeon: Maeola Harman, MD;  Location: [facility] OR[facility];  Service: Neurosurgery;  Laterality: N/A;   deep brain  stimulator, implantable pulse generator change  ? SUBTHALAMIC STIMULATOR BATTERY REPLACEMENT Left 04/21/2014  ? Procedure: Deep brain stimulator battery change;  Surgeon: Maeola Harman, MD;  Location: [facility];  Service: Neurosurgery;  Laterality: Left;  Deep brain stimulator battery change  ? SUBTHALAMIC STIMULATOR BATTERY REPLACEMENT N/A 08/10/2018  ? Procedure: Replacement of deep brain stimulator pulse generator/left abdomen with long lead extensions;  Surgeon: Maeola Harman, MD;  Location: [facility] OR;  Service: Neurosurgery;  Laterality: N/A;  ? ? ?Social History  ? ?Tobacco Use  ? Smoking status: Never  ?  Passive exposure: Past  ? Smokeless tobacco: Current  ?  Types: Chew  ? Tobacco comments:  ?  Trying to taper off.  Too expensive  ?Vaping Use  ? Vaping Use: Never used  ?Substance Use Topics  ? Alcohol use: No  ?  Alcohol/week: 0.0 standard drinks  ? Drug use: No  ? ? ? ?Medication list has been reviewed and updated. ? ?Current Meds  ?Medication Sig  ? albuterol (VENTOLIN HFA) 108 (90 Base) MCG/ACT inhaler INHALE 2 PUFFS INTO THE LUNGS EVERY 4 HOURS AS NEEDED FOR WHEEZING OR SHORTNESS OF BREATH  ? baclofen (LIORESAL) 10 MG tablet TAKE 1 TABLET(10 MG) BY MOUTH THREE  TIMES DAILY  ? budesonide (PULMICORT) 0.5 MG/2ML nebulizer solution Inhale into the lungs.  ? fluticasone furoate-vilanterol (BREO ELLIPTA) 100-25 MCG/ACT AEPB Inhale 1 puff into the lungs daily.  ? gabapentin (NEURONTIN) 600 MG tablet TAKE 1 TABLET BY MOUTH EVERY MORNING, 1 TABLET AT NOON, 1 TABLET EVERY EVENING AND 1 TABLET EVERY NIGHT AT BEDTIME  ? ipratropium-albuterol (DUONEB) 0.5-2.5 (3) MG/3ML SOLN Inhale into the lungs.  ? loratadine (CLARITIN) 10 MG tablet Take 1 tablet (10 mg total) by mouth daily.  ? meloxicam (MOBIC) 15 MG tablet TAKE 1 TABLET(15 MG) BY MOUTH DAILY  ? montelukast (SINGULAIR) 10 MG tablet Take 1 tablet (10 mg total) by mouth at bedtime. TAKE 1 TABLET(10 MG) BY MOUTH AT BEDTIME  ? omeprazole (PRILOSEC) 20 MG capsule TAKE 1  CAPSULE BY MOUTH DAILY (Patient taking differently: Take 20 mg by mouth daily.)  ? OXYGEN Inhale into the lungs. 3 L of oxygen  ? rOPINIRole (REQUIP) 2 MG tablet TAKE 1 TABLET BY MOUTH TWICE DAILY  ? sertraline (ZOLO

## 2021-11-25 ENCOUNTER — Ambulatory Visit
Admission: RE | Admit: 2021-11-25 | Discharge: 2021-11-25 | Disposition: A | Discharge status: Home / Self Care | Payer: Medicare Other | Source: Ambulatory Visit | Attending: Internal Medicine | Admitting: Internal Medicine

## 2021-11-25 VITALS — BP 135/101 | HR 109 | Temp 98.2°F | Resp 28

## 2021-11-25 DIAGNOSIS — J441 Chronic obstructive pulmonary disease with (acute) exacerbation: Secondary | ICD-10-CM

## 2021-11-25 NOTE — Discharge Instructions (Addendum)
Please head straight to the ED for management of your COPD flare. I'm concerned you may require admission and monitoring. You have declined transport via EMS, so please head straight there.

## 2021-11-25 NOTE — ED Notes (Signed)
Notified laura, pa of patient 

## 2021-11-25 NOTE — ED Triage Notes (Signed)
Pt reports shortness of breath, cough, burning in the chest when coughing  and phlegm x 3 days. States he s having a COPD flare up. Pt is using 4L O2. States he usually uses 3L 02.

## 2021-11-25 NOTE — ED Notes (Signed)
Patient is being discharged from the Urgent Care and sent to the Emergency Department via EMS-per laura, pa, patient refused  . Per Vernona Rieger, [place], patient is in need of higher level of care due to copd, asthma exacerbation. Patient is aware and verbalizes understanding of plan of care.  Vitals:   11/25/21 1306  BP: (!) 135/101  Pulse: (!) 109  Resp: (!) 28  Temp: 98.2 F (36.8 C)  SpO2: 100%

## 2021-11-25 NOTE — ED Provider Notes (Signed)
, [facility]    CSN: [id number] Arrival date & time: 11/25/21  1304      History   Chief Complaint Chief Complaint  Patient presents with   Appointment    1200   Shortness of Breath    HPI David BienenstockRonald E Rhodes is a 66 y.o. male presenting with shortness of breath, cough, pleuritic burning with coughing, cough productive of green sputum for 3 days.  History of COPD, asthma.  On 3 L of home oxygen at baseline, he is on 4 L today.  States his symptoms are consistent with his typical COPD exacerbation, he is hoping for a shot of "something" to resolve the symptoms.  1 month ago, the emergency department recommended admission for a similar event, and he left AMA.  He is using his albuterol and Breo Ellipta inhalers as directed, but has not been doing nebulizer treatments at home.  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   Inguinal hernia    bilateral   Pneumonia    many years ago   Shortness of breath    when allergies flare up   Urine incontinence    Wears glasses     Patient Active Problem List   Diagnosis Date Noted   Current moderate episode of major depressive disorder without prior episode (HCC) 02/26/2021   Primary osteoarthritis of first carpometacarpal joint of left hand 02/26/2021   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   Dependence on supplemental oxygen 04/18/2020   Leukocytosis 04/15/2019   Inguinal hernia recurrent bilateral 11/16/2018   Chronic left shoulder pain 10/05/2018   PICC (peripherally inserted central catheter) in place 04/16/2018   Essential tremor 10/18/2017   Cervical radiculopathy 07/23/2017   Bilateral  temporomandibular joint pain 07/23/2017   Neck pain 05/19/2017   Medication monitoring encounter 09/11/2016   Pulmonary nodules 12/31/2015   COPD (chronic obstructive pulmonary disease) (HCC) 12/27/2015   Chronic cough 12/27/2015   Asthma 09/24/2015   Allergic rhinitis 11/11/2014   Nerve root pain 11/11/2014   Dyskinesia 11/11/2014   Acid reflux 11/11/2014   Calcium blood increased 11/11/2014   Affective disorder (HCC) 11/11/2014   Essential hypertension 12/25/2011   Chronic back pain 12/25/2011    Past Surgical History:  Procedure Laterality Date   DEEP BRAIN STIMULATOR PLACEMENT     INGUINAL HERNIA REPAIR Bilateral 02/07/2014   Procedure:  BILATERAL INGUINAL HERNIA REPAIR;  Surgeon: Axel FillerArmando Ramirez, MD;  Location: [facility] OR;  Service: General;  Laterality: Bilateral;   INGUINAL HERNIA REPAIR Right 07/05/2021   INSERTION OF MESH Bilateral 02/07/2014   Procedure: INSERTION OF MESH;  Surgeon: Axel FillerArmando Ramirez, MD;  Location: [facility] OR;  Service: General;  Laterality: Bilateral;   NASAL SINUS SURGERY     PULSE GENERATOR IMPLANT Left 09/11/2016   Procedure: Left chest-Change implantable pulse generator battery;  Surgeon: Maeola HarmanJoseph Stern, MD;  [facility];  Service: Neurosurgery;  Laterality: Left;  Left chest-Change implantable pulse generator battery   PULSE GENERATOR IMPLANT Left 04/06/2018   Procedure: Revision of left chest implantable pulse generator, extension, and pocket adapter;  Surgeon:  Maeola Harman, MD;  Location: [facility] OR;  Service: Neurosurgery;  Laterality: Left;   SUBTHALAMIC STIMULATOR BATTERY REPLACEMENT  12/09/2011   Procedure: SUBTHALAMIC STIMULATOR BATTERY REPLACEMENT;  Surgeon: Maeola Harman, MD;  Location: [facility] NEURO ORS;  Service: Neurosurgery;  Laterality: N/A;   deep brain stimulator, implantable pulse generator change   SUBTHALAMIC STIMULATOR BATTERY REPLACEMENT Left 04/21/2014   Procedure: Deep brain stimulator battery change;  Surgeon: Maeola Harman, MD;  Location: [facility]  NEURO ORS;  Service: Neurosurgery;  Laterality: Left;  Deep brain stimulator battery change   SUBTHALAMIC STIMULATOR BATTERY REPLACEMENT N/A 08/10/2018   Procedure: Replacement of deep brain stimulator pulse generator/left abdomen with long lead extensions;  Surgeon: Maeola Harman, MD;  Location: [facility] OR;  Service: Neurosurgery;  Laterality: N/A;       Home Medications    Prior to Admission medications   Medication Sig Start Date End Date Taking? Authorizing Provider  albuterol (VENTOLIN HFA) 108 (90 Base) MCG/ACT inhaler INHALE 2 PUFFS INTO THE LUNGS EVERY 4 HOURS AS NEEDED FOR WHEEZING OR SHORTNESS OF BREATH 04/28/21   Reubin Milan, MD  baclofen (LIORESAL) 10 MG tablet TAKE 1 TABLET(10 MG) BY MOUTH THREE TIMES DAILY 09/12/20   Reubin Milan, MD  budesonide (PULMICORT) 0.5 MG/2ML nebulizer solution Inhale into the lungs. 02/11/21 02/11/22  [provider]  fluticasone furoate-vilanterol (BREO ELLIPTA) 100-25 MCG/ACT AEPB Inhale 1 puff into the lungs daily. 04/30/21   Reubin Milan, MD  gabapentin (NEURONTIN) 600 MG tablet TAKE 1 TABLET BY MOUTH EVERY MORNING, 1 TABLET AT NOON, 1 TABLET EVERY EVENING AND 1 TABLET EVERY NIGHT AT BEDTIME 09/18/21   Reubin Milan, MD  ipratropium-albuterol (DUONEB) 0.5-2.5 (3) MG/3ML SOLN Inhale into the lungs. 02/11/21 02/11/22  [provider]  loratadine (CLARITIN) 10 MG tablet Take 1 tablet (10 mg total) by mouth daily. 10/13/17   Enedina Finner, MD  meloxicam (MOBIC) 15 MG tablet TAKE 1 TABLET(15 MG) BY MOUTH DAILY 04/15/21   Reubin Milan, MD  montelukast (SINGULAIR) 10 MG tablet Take 1 tablet (10 mg total) by mouth at bedtime. TAKE 1 TABLET(10 MG) BY MOUTH AT BEDTIME 04/22/21   Reubin Milan, MD  omeprazole (PRILOSEC) 20 MG capsule TAKE 1 CAPSULE BY MOUTH DAILY Patient taking differently: Take 20 mg by mouth daily. 12/05/18   Reubin Milan, MD  OXYGEN Inhale into the lungs. 3 L of oxygen    [provider]   rOPINIRole (REQUIP) 2 MG tablet TAKE 1 TABLET BY MOUTH TWICE DAILY 09/18/21   Reubin Milan, MD  sertraline (ZOLOFT) 50 MG tablet Take 1 tablet (50 mg total) by mouth daily. 02/26/21   Reubin Milan, MD    Family History Family History  Problem Relation Age of Onset   Myoclonus Mother    Breast cancer Mother    Prostate cancer Father    Heart disease Paternal Grandfather    Myoclonus Maternal Uncle     Social History Social History   Tobacco Use   Smoking status: Never    Passive exposure: Past   Smokeless tobacco: Current    Types: Chew   Tobacco comments:    Trying to taper off.  Too expensive  Vaping Use   Vaping Use: Never used  Substance Use Topics   Alcohol use: No    Alcohol/week: 0.0 standard drinks   Drug use: No     Allergies   Penicillins   Review of Systems Review of Systems  Respiratory:  Positive  for shortness of breath.   All other systems reviewed and are negative.   Physical Exam Triage Vital Signs ED Triage Vitals  Enc Vitals Group     BP 11/25/21 1306 (!) 135/101     Pulse Rate 11/25/21 1306 (!) 109     Resp 11/25/21 1306 (!) 28     Temp 11/25/21 1306 98.2 F (36.8 C)     Temp Source 11/25/21 1306 Oral     SpO2 11/25/21 1306 100 %     Weight --      Height --      Head Circumference --      Peak Flow --      Pain Score 11/25/21 1308 0     Pain Loc --      Pain Edu? --      Excl. in GC? --    No data found.  Updated Vital Signs BP (!) 135/101 (BP Location: Right Arm)   Pulse (!) 109   Temp 98.2 F (36.8 C) (Oral)   Resp (!) 28   SpO2 100%   Visual Acuity Right Eye Distance:   Left Eye Distance:   Bilateral Distance:    Right Eye Near:   Left Eye Near:    Bilateral Near:     Physical Exam Vitals reviewed.  Constitutional:      General: He is not in acute distress.    Appearance: Normal appearance. He is cachectic. He is not ill-appearing or diaphoretic.  HENT:     Head: Normocephalic and atraumatic.   Cardiovascular:     Rate and Rhythm: Normal rate and regular rhythm.     Heart sounds: Normal heart sounds.  Pulmonary:     Effort: Pulmonary effort is normal.     Breath sounds: Decreased breath sounds present. No wheezing, rhonchi or rales.     Comments: Decreased breath sounds throughout  Nasal cannula in place  Skin:    General: Skin is warm.  Neurological:     General: No focal deficit present.     Mental Status: He is alert and oriented to person, place, and time.  Psychiatric:        Mood and Affect: Mood normal.        Behavior: Behavior normal.        Thought Content: Thought content normal.        Judgment: Judgment normal.     UC Treatments / Results  Labs (all labs ordered are listed, but only abnormal results are displayed) Labs Reviewed - No data to display  EKG   Radiology No results found.  Procedures Procedures (including critical care time)  Medications Ordered in UC Medications - No data to display  Initial Impression / Assessment and Plan / UC Course  I have reviewed the triage vital signs and the nursing notes.  Pertinent labs & imaging results that were available during my care of the patient were reviewed by me and considered in my medical decision making (see chart for details).     This patient is a very pleasant 66 y.o. year old male presenting with COPD flare. Tachy and tachypneic. He is on home O2 3L by nasal cannula at baseline, he is on 4L during visit. He is visibly tachypneic, with decreased breath sounds throughout. Discussed options for management, and strongly recommended ED evaluation. He is in agreement and states he will head straight there. Declines transport via EMS.   Final Clinical Impressions(s) / UC Diagnoses   Final  diagnoses:  COPD exacerbation [facility])     Discharge Instructions      Please head straight to the ED for management of your COPD flare. I'm concerned you may require admission and monitoring. You have  declined transport via EMS, so please head straight there.    ED Prescriptions   None    PDMP not reviewed this encounter.   Rhys Martini, PA-C 11/25/21 1334

## 2021-11-28 ENCOUNTER — Emergency Department
Admission: EM | Admit: 2021-11-28 | Discharge: 2021-11-28 | Disposition: A | Discharge status: Home / Self Care | Payer: Medicare Other | Attending: Emergency Medicine | Admitting: Emergency Medicine

## 2021-11-28 ENCOUNTER — Emergency Department: Payer: Medicare Other

## 2021-11-28 ENCOUNTER — Other Ambulatory Visit: Payer: Self-pay

## 2021-11-28 DIAGNOSIS — I10 Essential (primary) hypertension: Secondary | ICD-10-CM | POA: Insufficient documentation

## 2021-11-28 DIAGNOSIS — J449 Chronic obstructive pulmonary disease, unspecified: Secondary | ICD-10-CM | POA: Diagnosis not present

## 2021-11-28 DIAGNOSIS — R062 Wheezing: Secondary | ICD-10-CM | POA: Diagnosis not present

## 2021-11-28 DIAGNOSIS — R0602 Shortness of breath: Secondary | ICD-10-CM | POA: Diagnosis not present

## 2021-11-28 DIAGNOSIS — J441 Chronic obstructive pulmonary disease with (acute) exacerbation: Secondary | ICD-10-CM | POA: Insufficient documentation

## 2021-11-28 LAB — COMPREHENSIVE METABOLIC PANEL
ALT: 18 U/L (ref 0–44)
AST: 26 U/L (ref 15–41)
Albumin: 4 g/dL (ref 3.5–5.0)
Alkaline Phosphatase: 72 U/L (ref 38–126)
Anion gap: 3 — ABNORMAL LOW (ref 5–15)
BUN: 13 mg/dL (ref 8–23)
CO2: 26 mmol/L (ref 22–32)
Calcium: 10.2 mg/dL (ref 8.9–10.3)
Chloride: 110 mmol/L (ref 98–111)
Creatinine, Ser: 0.81 mg/dL (ref 0.61–1.24)
GFR, Estimated: >60 mL/min (ref >60)
Glucose, Bld: 102 mg/dL — ABNORMAL HIGH (ref 70–99)
Potassium: 4.5 mmol/L (ref 3.5–5.1)
Sodium: 139 mmol/L (ref 135–145)
Total Bilirubin: 0.8 mg/dL (ref 0.3–1.2)
Total Protein: 7.3 g/dL (ref 6.5–8.1)

## 2021-11-28 LAB — CBC
HCT: 48.3 % (ref 39.0–52.0)
Hemoglobin: 15.8 g/dL (ref 13.0–17.0)
MCH: 28.9 pg (ref 26.0–34.0)
MCHC: 32.7 g/dL (ref 30.0–36.0)
MCV: 88.5 fL (ref 80.0–100.0)
Platelets: 210 10*3/uL (ref 150–400)
RBC: 5.46 MIL/uL (ref 4.22–5.81)
RDW: 13.4 % (ref 11.5–15.5)
WBC: 9.6 10*3/uL (ref 4.0–10.5)
nRBC: 0 % (ref 0.0–0.2)

## 2021-11-28 LAB — TROPONIN I (HIGH SENSITIVITY): Troponin I (High Sensitivity): 3 ng/L (ref <18)

## 2021-11-28 MED ORDER — ALBUTEROL SULFATE HFA 108 (90 BASE) MCG/ACT IN AERS
2.0000 | INHALATION_SPRAY | RESPIRATORY_TRACT | Status: DC | PRN
Start: 2021-11-28 — End: 2021-11-28

## 2021-11-28 MED ORDER — PREDNISONE 10 MG PO TABS: 10.0000 mg | ORAL_TABLET | Freq: Every day | ORAL | 0 refills | Status: DC

## 2021-11-28 MED ORDER — IPRATROPIUM-ALBUTEROL 0.5-2.5 (3) MG/3ML IN SOLN
3.0000 mL | Freq: Once | RESPIRATORY_TRACT | Status: AC
Start: 2021-11-28 — End: 2021-11-28
  Administered 2021-11-28: 3 mL via RESPIRATORY_TRACT
  Filled 2021-11-28: qty 3

## 2021-11-28 MED ORDER — IPRATROPIUM-ALBUTEROL 0.5-2.5 (3) MG/3ML IN SOLN
3.0000 mL | Freq: Once | RESPIRATORY_TRACT | Status: AC
  Administered 2021-11-28: 3 mL via RESPIRATORY_TRACT
  Filled 2021-11-28: qty 3

## 2021-11-28 MED ORDER — AZITHROMYCIN 250 MG PO TABS: ORAL_TABLET | ORAL | 0 refills | Status: AC

## 2021-11-28 MED ORDER — METHYLPREDNISOLONE SODIUM SUCC 125 MG IJ SOLR
125.0000 mg | Freq: Once | INTRAMUSCULAR | Status: AC
  Administered 2021-11-28: 125 mg via INTRAVENOUS
  Filled 2021-11-28: qty 2

## 2021-11-28 NOTE — ED Triage Notes (Signed)
Pt states that he can't get better on his on, states that he has copd and reports that he is having an exacerbation, pt is wheezing and labored to breathe, states that he is always on 3L with his concentrator

## 2021-11-28 NOTE — ED Provider Notes (Signed)
[facility] Provider Note    Event Date/Time   First MD Initiated Contact with Patient 11/28/21 1302     (approximate)  History   Chief Complaint: Shortness of Breath  HPI  David Rhodes is a 66 y.o. male with a past medical history of hypertension, COPD on 3 L chronically who presents to the emergency department for worsening shortness of breath.  According to the patient for the past day or so he has been having worsening shortness of breath does state occasional cough.  Patient states he wears 3 L sometimes 4 L at home but continues to feel short of breath.  Patient denies any chest pain.  Denies any fever.  No leg pain or swelling.  Physical Exam   Triage Vital Signs: ED Triage Vitals  Enc Vitals Group     BP 11/28/21 1257 (!) 139/104     Pulse Rate 11/28/21 1257 (!) 104     Resp 11/28/21 1257 (!) 30     Temp 11/28/21 1257 97.6 F (36.4 C)     Temp Source 11/28/21 1257 Oral     SpO2 11/28/21 1257 96 %     Weight 11/28/21 1258 145 lb (65.8 kg)     Height 11/28/21 1258 6' (1.829 m)     Head Circumference --      Peak Flow --      Pain Score 11/28/21 1258 0     Pain Loc --      Pain Edu? --      Excl. in GC? --     Most recent vital signs: Vitals:   11/28/21 1257 11/28/21 1315  BP: (!) 139/104 (!) 141/113  Pulse: (!) 104 98  Resp: (!) 30 15  Temp: 97.6 F (36.4 C)   SpO2: 96% 96%    General: Awake, no distress.  CV:  Good peripheral perfusion.  Regular rate and rhythm  Resp:  Mild tachypnea, mild wheeze bilaterally. Abd:  No distention.  Soft, nontender.  No rebound or guarding.    ED Results / Procedures / Treatments   EKG  EKG viewed and interpreted by myself shows what appears to be a sinus rhythm at 97 bpm with a narrow QRS.  Normal axis.  Of the leads that I can see intervals they appear to be normal however patient has significant electrical interference making evaluation quite difficult.  RADIOLOGY  I have reviewed the  chest x-ray images no acute pneumonia or pneumothorax seen on my evaluation.   MEDICATIONS ORDERED IN ED: Medications  albuterol (VENTOLIN HFA) 108 (90 Base) MCG/ACT inhaler 2 puff (has no administration in time range)  methylPREDNISolone sodium succinate (SOLU-MEDROL) 125 mg/2 mL injection 125 mg (has no administration in time range)  ipratropium-albuterol (DUONEB) 0.5-2.5 (3) MG/3ML nebulizer solution 3 mL (has no administration in time range)  ipratropium-albuterol (DUONEB) 0.5-2.5 (3) MG/3ML nebulizer solution 3 mL (has no administration in time range)     IMPRESSION / MDM / ASSESSMENT AND PLAN / ED COURSE  I reviewed the triage vital signs and the nursing notes.  Patient's presentation is most consistent with acute presentation with potential threat to life or bodily function.  Patient presents emergency department for several days of worsening shortness of breath.  Patient has a history of COPD with wheeze on examination most consistent with likely COPD exacerbation.  We will check labs including cardiac enzymes to rule out ACS.  I have reviewed the chest x-ray images, no obvious pneumonia or  pneumothorax seen on my evaluation awaiting radiology read.  Presentation most consistent with COPD exacerbation.  We will treat with Solu-Medrol, DuoNebs and continue to closely monitor.  Patient agreeable to plan of care.  Patient's labs have resulted largely within normal limits.  CBC is normal including a normal white blood cell count.  Normal chemistry and a negative troponin.  Chest x-ray does not appear to show any acute abnormality.  Patient states he is feeling much better after breathing treatments and steroids.  I offered admission given his chronic COPD however patient wishes to be discharged home.  We will discharge with a prednisone taper as well as a course of Zithromax have the patient follow-up with his doctor.  Discussed return precautions.  FINAL CLINICAL IMPRESSION(S) / ED  DIAGNOSES   Dyspnea COPD exacerbation   Note:  This document was prepared using Dragon voice recognition software and may include unintentional dictation errors.   Minna Antis, MD 11/28/21 1440

## 2021-12-08 DIAGNOSIS — J439 Emphysema, unspecified: Secondary | ICD-10-CM | POA: Diagnosis not present

## 2021-12-08 DIAGNOSIS — E872 Acidosis, unspecified: Secondary | ICD-10-CM | POA: Diagnosis not present

## 2021-12-08 DIAGNOSIS — J441 Chronic obstructive pulmonary disease with (acute) exacerbation: Secondary | ICD-10-CM | POA: Diagnosis not present

## 2021-12-08 DIAGNOSIS — J9602 Acute respiratory failure with hypercapnia: Secondary | ICD-10-CM | POA: Diagnosis not present

## 2021-12-08 DIAGNOSIS — K219 Gastro-esophageal reflux disease without esophagitis: Secondary | ICD-10-CM | POA: Diagnosis not present

## 2021-12-08 DIAGNOSIS — R0602 Shortness of breath: Secondary | ICD-10-CM | POA: Diagnosis not present

## 2021-12-08 DIAGNOSIS — G2581 Restless legs syndrome: Secondary | ICD-10-CM | POA: Diagnosis not present

## 2021-12-08 DIAGNOSIS — Z880 Allergy status to penicillin: Secondary | ICD-10-CM | POA: Diagnosis not present

## 2021-12-08 DIAGNOSIS — J449 Chronic obstructive pulmonary disease, unspecified: Secondary | ICD-10-CM | POA: Diagnosis not present

## 2021-12-08 DIAGNOSIS — I10 Essential (primary) hypertension: Secondary | ICD-10-CM | POA: Diagnosis not present

## 2021-12-08 DIAGNOSIS — F17220 Nicotine dependence, chewing tobacco, uncomplicated: Secondary | ICD-10-CM | POA: Diagnosis not present

## 2021-12-08 DIAGNOSIS — J45909 Unspecified asthma, uncomplicated: Secondary | ICD-10-CM | POA: Diagnosis not present

## 2021-12-08 DIAGNOSIS — Z7722 Contact with and (suspected) exposure to environmental tobacco smoke (acute) (chronic): Secondary | ICD-10-CM | POA: Diagnosis not present

## 2021-12-08 DIAGNOSIS — Z681 Body mass index (BMI) 19 or less, adult: Secondary | ICD-10-CM | POA: Diagnosis not present

## 2021-12-08 DIAGNOSIS — E43 Unspecified severe protein-calorie malnutrition: Secondary | ICD-10-CM | POA: Diagnosis not present

## 2021-12-08 DIAGNOSIS — E46 Unspecified protein-calorie malnutrition: Secondary | ICD-10-CM | POA: Diagnosis not present

## 2021-12-08 DIAGNOSIS — J9622 Acute and chronic respiratory failure with hypercapnia: Secondary | ICD-10-CM | POA: Diagnosis not present

## 2021-12-08 DIAGNOSIS — M6250 Muscle wasting and atrophy, not elsewhere classified, unspecified site: Secondary | ICD-10-CM | POA: Diagnosis not present

## 2021-12-08 DIAGNOSIS — Z66 Do not resuscitate: Secondary | ICD-10-CM | POA: Diagnosis not present

## 2021-12-08 DIAGNOSIS — Z79899 Other long term (current) drug therapy: Secondary | ICD-10-CM | POA: Diagnosis not present

## 2021-12-08 DIAGNOSIS — E213 Hyperparathyroidism, unspecified: Secondary | ICD-10-CM | POA: Diagnosis not present

## 2021-12-18 ENCOUNTER — Encounter: Payer: Self-pay | Admitting: Infectious Diseases

## 2021-12-23 ENCOUNTER — Telehealth: Payer: Self-pay | Admitting: Internal Medicine

## 2021-12-24 NOTE — Telephone Encounter (Signed)
Called Melissa let her know that we did get the fax but  Dr. Judithann Graves did not prescribe the nebulizer or the solution that the patient has a pulmonologist. She verbalized understanding.  KP

## 2021-12-25 ENCOUNTER — Telehealth: Payer: Self-pay | Admitting: Internal Medicine

## 2021-12-25 NOTE — Telephone Encounter (Signed)
[facility] called and spoke to [facility], Pensions consultant and advised of the note below to contact Pulmonologist office. She says that Dr. Judithann Graves approved the Duoneb today and the patient says he doesn't have a pulmonologist. She says all they need is the last OV note indicating a pulmonary diagnosis so the insurance will approve the nebulizer solution. Advised I will send this to Dr. Judithann Graves.

## 2021-12-25 NOTE — Telephone Encounter (Signed)
[facility] called yesterday 12/24/2021. Let them know that pt has a pulmonologist that the orders need to be faxed to them.  KP

## 2021-12-25 NOTE — Telephone Encounter (Signed)
Copied from CRM [id number]. Topic: General - Other >> Dec 25, 2021  3:07 PM Lyman Speller wrote: Reason for CRM: [facility] called and needs the OV note from April for the RX for the nebulizer/ medicare requires documentation to support it / please fax to [phone]

## 2021-12-26 NOTE — Telephone Encounter (Signed)
Last office visit printed and faxed.  KP 

## 2022-02-10 DIAGNOSIS — J439 Emphysema, unspecified: Secondary | ICD-10-CM | POA: Diagnosis not present

## 2022-02-10 DIAGNOSIS — J45909 Unspecified asthma, uncomplicated: Secondary | ICD-10-CM | POA: Diagnosis not present

## 2022-02-10 DIAGNOSIS — J449 Chronic obstructive pulmonary disease, unspecified: Secondary | ICD-10-CM | POA: Diagnosis not present

## 2022-02-12 DIAGNOSIS — R0609 Other forms of dyspnea: Secondary | ICD-10-CM | POA: Diagnosis not present

## 2022-02-12 DIAGNOSIS — J4540 Moderate persistent asthma, uncomplicated: Secondary | ICD-10-CM | POA: Diagnosis not present

## 2022-02-12 DIAGNOSIS — J449 Chronic obstructive pulmonary disease, unspecified: Secondary | ICD-10-CM | POA: Diagnosis not present

## 2022-02-12 DIAGNOSIS — R079 Chest pain, unspecified: Secondary | ICD-10-CM | POA: Diagnosis not present

## 2022-02-26 DIAGNOSIS — R0609 Other forms of dyspnea: Secondary | ICD-10-CM | POA: Diagnosis not present

## 2022-02-26 DIAGNOSIS — R079 Chest pain, unspecified: Secondary | ICD-10-CM | POA: Diagnosis not present

## 2022-03-13 DIAGNOSIS — J45909 Unspecified asthma, uncomplicated: Secondary | ICD-10-CM | POA: Diagnosis not present

## 2022-03-13 DIAGNOSIS — J449 Chronic obstructive pulmonary disease, unspecified: Secondary | ICD-10-CM | POA: Diagnosis not present

## 2022-03-13 DIAGNOSIS — J439 Emphysema, unspecified: Secondary | ICD-10-CM | POA: Diagnosis not present

## 2022-03-18 NOTE — Progress Notes (Deleted)
Subjective:   David Rhodes is a 66 y.o. male who presents for Medicare Annual/Subsequent preventive examination.  I connected with  David Rhodes on 03/18/22 by an in person visit verified that I am speaking with the correct person using two identifiers.  Patient Location: Other:  Publishing copy Location: Office/Clinic  Review of Systems    Defer to PCP       Objective:    There were no vitals filed for this visit. There is no height or weight on file to calculate BMI.     11/28/2021    1:01 PM 10/03/2021   12:42 PM 07/17/2021    2:05 PM 05/11/2021   12:04 PM 03/18/2021    8:34 AM 12/26/2019    4:35 PM 06/19/2019    5:26 PM  Advanced Directives  Does Patient Have a Medical Advance Directive? No No No No No No No  Would patient like information on creating a medical advance directive? No - Patient declined No - Patient declined No - Patient declined No - Patient declined Yes (MAU/Ambulatory/Procedural Areas - Information given)  No - Patient declined    Current Medications (verified) Outpatient Encounter Medications as of 03/19/2022  Medication Sig   albuterol (VENTOLIN HFA) 108 (90 Base) MCG/ACT inhaler INHALE 2 PUFFS INTO THE LUNGS EVERY 4 HOURS AS NEEDED FOR WHEEZING OR SHORTNESS OF BREATH   baclofen (LIORESAL) 10 MG tablet TAKE 1 TABLET(10 MG) BY MOUTH THREE TIMES DAILY   fluticasone furoate-vilanterol (BREO ELLIPTA) 100-25 MCG/ACT AEPB Inhale 1 puff into the lungs daily.   gabapentin (NEURONTIN) 600 MG tablet TAKE 1 TABLET BY MOUTH EVERY MORNING, 1 TABLET AT NOON, 1 TABLET EVERY EVENING AND 1 TABLET EVERY NIGHT AT BEDTIME   loratadine (CLARITIN) 10 MG tablet Take 1 tablet (10 mg total) by mouth daily.   meloxicam (MOBIC) 15 MG tablet TAKE 1 TABLET(15 MG) BY MOUTH DAILY   montelukast (SINGULAIR) 10 MG tablet Take 1 tablet (10 mg total) by mouth at bedtime. TAKE 1 TABLET(10 MG) BY MOUTH AT BEDTIME   omeprazole (PRILOSEC) 20 MG capsule TAKE 1 CAPSULE BY MOUTH DAILY  (Patient taking differently: Take 20 mg by mouth daily.)   OXYGEN Inhale into the lungs. 3 L of oxygen   predniSONE (DELTASONE) 10 MG tablet Take 1 tablet (10 mg total) by mouth daily. Day 1-3: take 4 tablets PO daily Day 4-6: take 3 tablets PO daily Day 7-9: take 2 tablets PO daily Day 10-12: take 1 tablet PO daily   rOPINIRole (REQUIP) 2 MG tablet TAKE 1 TABLET BY MOUTH TWICE DAILY   sertraline (ZOLOFT) 50 MG tablet Take 1 tablet (50 mg total) by mouth daily.   No facility-administered encounter medications on file as of 03/19/2022.    Allergies (verified) Penicillins   History: Past Medical History:  Diagnosis Date   Allergy    Hay fever   Anxiety    Asthma    Chronic pain due to trauma    COPD (chronic obstructive pulmonary disease) (HCC)    Depression    Essential tremor    GERD (gastroesophageal reflux disease)    Headache(784.0)    neck pain   Hypertension    Infection of chest IPG pocket ([place]) 04/06/2018   Inguinal hernia    bilateral   Pneumonia    many years ago   Shortness of breath    when allergies flare up   Urine incontinence    Wears glasses    Past Surgical  History:  Procedure Laterality Date   DEEP BRAIN STIMULATOR PLACEMENT     INGUINAL HERNIA REPAIR Bilateral 02/07/2014   Procedure:  BILATERAL INGUINAL HERNIA REPAIR;  Surgeon: Ralene Ok, MD;  Location: [facility];  Service: General;  Laterality: Bilateral;   INGUINAL HERNIA REPAIR Right 07/05/2021   INSERTION OF MESH Bilateral 02/07/2014   Procedure: INSERTION OF MESH;  Surgeon: Ralene Ok, MD;  Location: [facility];  Service: General;  Laterality: Bilateral;   NASAL SINUS SURGERY     PULSE GENERATOR IMPLANT Left 09/11/2016   Procedure: Left chest-Change implantable pulse generator battery;  Surgeon: Erline Levine, MD;  Location: [facility];  Service: Neurosurgery;  Laterality: Left;  Left chest-Change implantable pulse generator battery   PULSE GENERATOR IMPLANT Left 04/06/2018   Procedure: Revision of  left chest implantable pulse generator, extension, and pocket adapter;  Surgeon: Erline Levine, MD;  Location: [facility];  Service: Neurosurgery;  Laterality: Left;   SUBTHALAMIC STIMULATOR BATTERY REPLACEMENT  12/09/2011   Procedure: SUBTHALAMIC STIMULATOR BATTERY REPLACEMENT;  Surgeon: Erline Levine, MD;  Location: [facility];  Service: Neurosurgery;  Laterality: N/A;   deep brain stimulator, implantable pulse generator change   SUBTHALAMIC STIMULATOR BATTERY REPLACEMENT Left 04/21/2014   Procedure: Deep brain stimulator battery change;  Surgeon: Erline Levine, MD;  Location: [facility];  Service: Neurosurgery;  Laterality: Left;  Deep brain stimulator battery change   SUBTHALAMIC STIMULATOR BATTERY REPLACEMENT N/A 08/10/2018   Procedure: Replacement of deep brain stimulator pulse generator/left abdomen with long lead extensions;  Surgeon: Erline Levine, MD;  Location: [facility];  Service: Neurosurgery;  Laterality: N/A;   Family History  Problem Relation Age of Onset   Myoclonus Mother    Breast cancer Mother    Prostate cancer Father    Heart disease Paternal Grandfather    Myoclonus Maternal Uncle    Social History   Socioeconomic History   Marital status: Single    Spouse name: 0   Number of children: Not on file   Years of education: Not on file   Highest education level: Associate degree: occupational, Hotel manager, or vocational program  Occupational History   Not on file  Tobacco Use   Smoking status: Never    Passive exposure: Past   Smokeless tobacco: Current    Types: Chew   Tobacco comments:    Trying to taper off.  Too expensive  Vaping Use   Vaping Use: Never used  Substance and Sexual Activity   Alcohol use: No    Alcohol/week: 0.0 standard drinks of alcohol   Drug use: No   Sexual activity: Not on file  Other Topics Concern   Not on file  Social History Narrative   Pt lives alone   disabled   Social Determinants of Health   Financial Resource Strain: Medium  Risk (03/18/2021)   Overall Financial Resource Strain (CARDIA)    Difficulty of Paying Living Expenses: Somewhat hard  Food Insecurity: No Food Insecurity (03/18/2021)   Hunger Vital Sign    Worried About Running Out of Food in the Last Year: Never true    Ran Out of Food in the Last Year: Never true  Transportation Needs: No Transportation Needs (03/18/2021)   PRAPARE - Hydrologist (Medical): No    Lack of Transportation (Non-Medical): No  Physical Activity: Inactive (03/18/2021)   Exercise Vital Sign    Days of Exercise per Week: 0 days    Minutes of Exercise per Session: 0 min  Stress: No Stress Concern Present (03/18/2021)   [place]    Feeling of Stress : Only a little  Social Connections: Socially Isolated (03/18/2021)   Social Connection and Isolation Panel [NHANES]    Frequency of Communication with Friends and Family: More than three times a week    Frequency of Social Gatherings with Friends and Family: Three times a week    Attends Religious Services: Never    Active Member of Clubs or Organizations: No    Attends Archivist Meetings: Never    Marital Status: Never married    Tobacco Counseling Ready to quit: Not Answered Counseling given: Not Answered Tobacco comments: Trying to taper off.  Too expensive   Clinical Intake:                 Diabetic? No.         Activities of Daily Living    10/21/2021    8:28 AM 04/22/2021    9:23 AM  In your present state of health, do you have any difficulty performing the following activities:  Hearing? 1 0  Vision? 0 1  Difficulty concentrating or making decisions? 0 0  Walking or climbing stairs? 0 1  Dressing or bathing? 0 1  Doing errands, shopping? 0 0    Patient Care Team: Glean Hess, MD as PCP - General (Internal Medicine) Erby Pian, MD as Referring Physician (Pulmonary  Disease) Erline Levine, MD as Consulting Physician (Neurosurgery) Lequita Asal, MD (Inactive) as Referring Physician (Hematology and Oncology) Marni Griffon, MD as Referring Physician (Pulmonary Disease)  Indicate any recent Medical Services you may have received from other than [facility] providers in the past year (date may be approximate).     Assessment:   This is a routine wellness examination for [place].  Hearing/Vision screen No results found.  Dietary issues and exercise activities discussed:     Goals Addressed   None   Depression Screen    10/21/2021    8:28 AM 04/22/2021    9:23 AM 03/18/2021    8:31 AM 02/26/2021   10:18 AM 01/09/2021   10:15 AM 07/13/2020   10:15 AM 04/18/2020   10:54 AM  PHQ 2/9 Scores  PHQ - 2 Score 2 6 6 4 3 2 4   PHQ- 9 Score 8 18 15 15 9 4 8     Fall Risk    10/21/2021    8:29 AM 07/17/2021    2:05 PM 04/22/2021    9:23 AM 03/18/2021    8:35 AM 02/26/2021   10:19 AM  Fall Risk   Falls in the past year? 0 0 0 0 0  Number falls in past yr: 0  0 0 0  Injury with Fall? 0  0 0 0  Risk for fall due to : No Fall Risks Medication side effect No Fall Risks Other (Comment) No Fall Risks  Risk for fall due to: Comment    oxygen tank   Follow up Falls evaluation completed Education provided;Falls prevention discussed Falls evaluation completed Falls prevention discussed Falls evaluation completed    FALL RISK PREVENTION PERTAINING TO THE HOME:  Any stairs in or around the home? {YES/NO:21197} If so, are there any without handrails? {YES/NO:21197} Home free of loose throw rugs in walkways, pet beds, electrical cords, etc? {YES/NO:21197} Adequate lighting in your home to reduce risk of falls? {YES/NO:21197}  ASSISTIVE DEVICES UTILIZED TO PREVENT FALLS:  Life alert? {  YES/NO:21197} Use of a cane, walker or w/c? {YES/NO:21197} Grab bars in the bathroom? {YES/NO:21197} Shower chair or bench in shower? {YES/NO:21197} Elevated toilet seat or a  handicapped toilet? {YES/NO:21197}  TIMED UP AND GO:  Was the test performed? {YES/NO:21197}.   {Appearance of GXQJ:1941740}  Cognitive Function:        Immunizations Immunization History  Administered Date(s) Administered   PNEUMOCOCCAL CONJUGATE-20 03/18/2021   Pneumococcal Polysaccharide-23 04/14/2019   Tdap 03/31/2019    TDAP status: Up to date  Flu Vaccine status: Declined, Education has been provided regarding the importance of this vaccine but patient still declined. Advised may receive this vaccine at local pharmacy or Health Dept. Aware to provide a copy of the vaccination record if obtained from local pharmacy or Health Dept. Verbalized acceptance and understanding.  Pneumococcal vaccine status: Up to date  Covid-19 vaccine status: Declined, Education has been provided regarding the importance of this vaccine but patient still declined. Advised may receive this vaccine at local pharmacy or Health Dept.or vaccine clinic. Aware to provide a copy of the vaccination record if obtained from local pharmacy or Health Dept. Verbalized acceptance and understanding.  Qualifies for Shingles Vaccine? Yes   Zostavax completed No   Shingrix Completed?: No.    Education has been provided regarding the importance of this vaccine. Patient has been advised to call insurance company to determine out of pocket expense if they have not yet received this vaccine. Advised may also receive vaccine at local pharmacy or Health Dept. Verbalized acceptance and understanding.  Screening Tests Health Maintenance  Topic Date Due   COVID-19 Vaccine (1) Never done   Zoster Vaccines- Shingrix (1 of 2) Never done   COLONOSCOPY (Pts 45-36yrs Insurance coverage will need to be confirmed)  Never done   INFLUENZA VACCINE  Never done   TETANUS/TDAP  03/30/2029   Pneumonia Vaccine 67+ Years old  Completed   Hepatitis C Screening  Completed   HPV VACCINES  Aged Out    Health Maintenance  Health  Maintenance Due  Topic Date Due   COVID-19 Vaccine (1) Never done   Zoster Vaccines- Shingrix (1 of 2) Never done   COLONOSCOPY (Pts 45-57yrs Insurance coverage will need to be confirmed)  Never done   INFLUENZA VACCINE  Never done    {Colorectal cancer screening:2101809}  Lung Cancer Screening: (Low Dose CT Chest recommended if Age 75-80 years, 30 pack-year currently smoking OR have quit w/in 15years.) does qualify.   Lung Cancer Screening Referral: Pt Declined.  Additional Screening:  Hepatitis C Screening: does qualify; Completed 10/05/2018  Vision Screening: Recommended annual ophthalmology exams for early detection of glaucoma and other disorders of the eye. Is the patient up to date with their annual eye exam?  {YES/NO:21197} Who is the provider or what is the name of the office in which the patient attends annual eye exams? *** If pt is not established with a provider, would they like to be referred to a provider to establish care? {YES/NO:21197}.   Dental Screening: Recommended annual dental exams for proper oral hygiene  Community Resource Referral / Chronic Care Management: CRR required this visit?  {YES/NO:21197}  CCM required this visit?  {YES/NO:21197}     Plan:     I have personally reviewed and noted the following in the patient's chart:   Medical and social history Use of alcohol, tobacco or illicit drugs  Current medications and supplements including opioid prescriptions. Patient is not currently taking opioid prescriptions. Functional ability and status  Nutritional status Physical activity Advanced directives List of other physicians Hospitalizations, surgeries, and ER visits in previous 12 months Vitals Screenings to include cognitive, depression, and falls Referrals and appointments  In addition, I have reviewed and discussed with patient certain preventive protocols, quality metrics, and best practice recommendations. A written personalized care  plan for preventive services as well as general preventive health recommendations were provided to patient.     Clista Bernhardt, [place]   03/18/2022    Mr. Schuerman , Thank you for taking time to come for your Medicare Wellness Visit. I appreciate your ongoing commitment to your health goals. Please review the following plan we discussed and let me know if I can assist you in the future.   These are the goals we discussed:  Goals      DIET - INCREASE WATER INTAKE     Recommend drinking 6-8 glasses of water per day         This is a list of the screening recommended for you and due dates:  Health Maintenance  Topic Date Due   COVID-19 Vaccine (1) Never done   Zoster (Shingles) Vaccine (1 of 2) Never done   Colon Cancer Screening  Never done   Flu Shot  Never done   Tetanus Vaccine  03/30/2029   Pneumonia Vaccine  Completed   Hepatitis C Screening: USPSTF Recommendation to screen - Ages 106-79 yo.  Completed   HPV Vaccine  Aged Out      Nurse Notes: ***

## 2022-03-19 ENCOUNTER — Ambulatory Visit (INDEPENDENT_AMBULATORY_CARE_PROVIDER_SITE_OTHER): Payer: Medicare Other

## 2022-03-19 ENCOUNTER — Other Ambulatory Visit: Payer: Self-pay

## 2022-03-19 ENCOUNTER — Encounter (HOSPITAL_COMMUNITY): Payer: Self-pay | Admitting: Emergency Medicine

## 2022-03-19 ENCOUNTER — Emergency Department (HOSPITAL_COMMUNITY)
Admission: EM | Admit: 2022-03-19 | Discharge: 2022-03-19 | Disposition: A | Discharge status: Home / Self Care | Payer: Medicare Other | Attending: Emergency Medicine | Admitting: Emergency Medicine

## 2022-03-19 DIAGNOSIS — R251 Tremor, unspecified: Secondary | ICD-10-CM | POA: Diagnosis not present

## 2022-03-19 DIAGNOSIS — Z0000 Encounter for general adult medical examination without abnormal findings: Secondary | ICD-10-CM

## 2022-03-19 NOTE — ED Provider Notes (Signed)
[facility] EMERGENCY DEPARTMENT Provider Note   CSN: [id number] Arrival date & time: 03/19/22  1406     History  No chief complaint on file.   David Rhodes is a 66 y.o. male.  The history is provided by the patient and medical records. No language interpreter was used.     66 year old male with hx of chronic headache with brain stimulator presenting requesting for battery replacement of his brain stimulator.  Sts he has an appointment to get battery replaced sometimes last week but missed his appointment.  He has access to a car today and went to the office but it was off hour.  He was told to come to the ER for further care.  Pt report he has trouble with coordination for 1-2 weeks when the stimulator is not functioning.  He denies si/hi, increasing pain.  Home Medications Prior to Admission medications   Medication Sig Start Date End Date Taking? Authorizing Provider  albuterol (VENTOLIN HFA) 108 (90 Base) MCG/ACT inhaler INHALE 2 PUFFS INTO THE LUNGS EVERY 4 HOURS AS NEEDED FOR WHEEZING OR SHORTNESS OF BREATH 04/28/21   Glean Hess, MD  baclofen (LIORESAL) 10 MG tablet TAKE 1 TABLET(10 MG) BY MOUTH THREE TIMES DAILY 09/12/20   Glean Hess, MD  fluticasone furoate-vilanterol (BREO ELLIPTA) 100-25 MCG/ACT AEPB Inhale 1 puff into the lungs daily. 04/30/21   Glean Hess, MD  gabapentin (NEURONTIN) 600 MG tablet TAKE 1 TABLET BY MOUTH EVERY MORNING, 1 TABLET AT NOON, 1 TABLET EVERY EVENING AND 1 TABLET EVERY NIGHT AT BEDTIME 09/18/21   Glean Hess, MD  loratadine (CLARITIN) 10 MG tablet Take 1 tablet (10 mg total) by mouth daily. 10/13/17   Fritzi Mandes, MD  meloxicam (MOBIC) 15 MG tablet TAKE 1 TABLET(15 MG) BY MOUTH DAILY 04/15/21   Glean Hess, MD  montelukast (SINGULAIR) 10 MG tablet Take 1 tablet (10 mg total) by mouth at bedtime. TAKE 1 TABLET(10 MG) BY MOUTH AT BEDTIME 04/22/21   Glean Hess, MD  omeprazole (PRILOSEC) 20 MG  capsule TAKE 1 CAPSULE BY MOUTH DAILY Patient taking differently: Take 20 mg by mouth daily. 12/05/18   Glean Hess, MD  OXYGEN Inhale into the lungs. 3 L of oxygen    [provider]  predniSONE (DELTASONE) 10 MG tablet Take 1 tablet (10 mg total) by mouth daily. Day 1-3: take 4 tablets PO daily Day 4-6: take 3 tablets PO daily Day 7-9: take 2 tablets PO daily Day 10-12: take 1 tablet PO daily 11/28/21   Harvest Dark, MD  rOPINIRole (REQUIP) 2 MG tablet TAKE 1 TABLET BY MOUTH TWICE DAILY 09/18/21   Glean Hess, MD  sertraline (ZOLOFT) 50 MG tablet Take 1 tablet (50 mg total) by mouth daily. 02/26/21   Glean Hess, MD      Allergies    Penicillins    Review of Systems   Review of Systems  All other systems reviewed and are negative.   Physical Exam Updated Vital Signs BP (!) 153/103 (BP Location: Right Arm)   Pulse 78   Temp 98.2 F (36.8 C) (Oral)   Resp 16   SpO2 100%  Physical Exam Vitals and nursing note reviewed.  Constitutional:      General: He is not in acute distress.    Appearance: He is well-developed.  HENT:     Head: Atraumatic.  Eyes:     Conjunctiva/sclera: Conjunctivae normal.  Cardiovascular:  Rate and Rhythm: Normal rate and regular rhythm.     Pulses: Normal pulses.     Heart sounds: Normal heart sounds.  Pulmonary:     Effort: Pulmonary effort is normal.     Breath sounds: Normal breath sounds.  Abdominal:     Palpations: Abdomen is soft.  Musculoskeletal:     Cervical back: Neck supple.  Skin:    Findings: No rash.  Neurological:     Mental Status: He is alert. Mental status is at baseline.     ED Results / Procedures / Treatments   Labs (all labs ordered are listed, but only abnormal results are displayed) Labs Reviewed - No data to display  EKG None  Radiology No results found.  Procedures Procedures    Medications Ordered in ED Medications - No data to display  ED Course/ Medical Decision  Making/ A&P                           Medical Decision Making  BP (!) 153/103 (BP Location: Right Arm)   Pulse 78   Temp 98.2 F (36.8 C) (Oral)   Resp 16   SpO62 82%   66 year old male with hx of deep brain stimulator who missed his appointment to get his battery replaced 1-2 weeks ago.  He has access to a car today and wanted to see his doctor to get his battery replaced but was told to come to the ER to get help.  He does endorse tremors to hands when stimulator is not functioning.  Denies increasing pain or fever.    On exam, pt is having a slight intentional tremor and shaky speech.  I did offer to obtain basic labs and once pt has a room to be evaluated then neurosurgeon can be contact for recommendation.  However, pt sts he now prefers to schedule an appointment on his own time to f/u with his doctor.  I agree with plan and give return precaution.  Will d/c per request. Given sxs ongoing 1-2 weeks and pt is able to maintain and tolerates his tremor, I felt he is safe to be discharge at this time.        Final Clinical Impression(s) / ED Diagnoses Final diagnoses:  Tremor    Rx / DC Orders ED Discharge Orders     None         Fayrene Helper, PA-C 03/19/22 1554    Blane Ohara, MD 03/20/22 1446

## 2022-03-19 NOTE — Discharge Instructions (Signed)
Please call your doctor for further management of your condition including changing your deep brain stimulator battery. Return if you have any concerns.

## 2022-03-19 NOTE — ED Triage Notes (Signed)
Patient sent to ED for evaluation and possible replacement of his brain stimulator which has a dead battery.

## 2022-03-20 ENCOUNTER — Telehealth: Payer: Self-pay | Admitting: Neurology

## 2022-03-20 NOTE — Telephone Encounter (Signed)
Patient did go to ED yesterday for his tremors and in the notes it states he had an appointment to get his battery replaced but missed his appointment

## 2022-03-20 NOTE — Telephone Encounter (Signed)
Patient last seen 08/2019

## 2022-03-20 NOTE — Telephone Encounter (Signed)
Pt called in stating his DBS has stopped working and he is unable to function. He says it's nearly impossible for him to feed himself.

## 2022-03-20 NOTE — Telephone Encounter (Signed)
Pt called advised he needs to call neuro surgery to have his battery changed that Dr Tat can not change his battery he asked if we could fix is tremor while he does not have a battery he was advised he has not been seen In over 3 years in the office pt stated ok and hung up the phone

## 2022-04-10 NOTE — Patient Instructions (Signed)
Health Maintenance, Male Adopting a healthy lifestyle and getting preventive care are important in promoting health and wellness. Ask your health care provider about: The right schedule for you to have regular tests and exams. Things you can do on your own to prevent diseases and keep yourself healthy. What should I know about diet, weight, and exercise? Eat a healthy diet  Eat a diet that includes plenty of vegetables, fruits, low-fat dairy products, and lean protein. Do not eat a lot of foods that are high in solid fats, added sugars, or sodium. Maintain a healthy weight Body mass index (BMI) is a measurement that can be used to identify possible weight problems. It estimates body fat based on height and weight. Your health care provider can help determine your BMI and help you achieve or maintain a healthy weight. Get regular exercise Get regular exercise. This is one of the most important things you can do for your health. Most adults should: Exercise for at least 150 minutes each week. The exercise should increase your heart rate and make you sweat (moderate-intensity exercise). Do strengthening exercises at least twice a week. This is in addition to the moderate-intensity exercise. Spend less time sitting. Even light physical activity can be beneficial. Watch cholesterol and blood lipids Have your blood tested for lipids and cholesterol at 66 years of age, then have this test every 5 years. You may need to have your cholesterol levels checked more often if: Your lipid or cholesterol levels are high. You are older than 66 years of age. You are at high risk for heart disease. What should I know about cancer screening? Many types of cancers can be detected early and may often be prevented. Depending on your health history and family history, you may need to have cancer screening at various ages. This may include screening for: Colorectal cancer. Prostate cancer. Skin cancer. Lung  cancer. What should I know about heart disease, diabetes, and high blood pressure? Blood pressure and heart disease High blood pressure causes heart disease and increases the risk of stroke. This is more likely to develop in people who have high blood pressure readings or are overweight. Talk with your health care provider about your target blood pressure readings. Have your blood pressure checked: Every 3-5 years if you are 18-39 years of age. Every year if you are 40 years old or older. If you are between the ages of 65 and 75 and are a current or former smoker, ask your health care provider if you should have a one-time screening for abdominal aortic aneurysm (AAA). Diabetes Have regular diabetes screenings. This checks your fasting blood sugar level. Have the screening done: Once every three years after age 45 if you are at a normal weight and have a low risk for diabetes. More often and at a younger age if you are overweight or have a high risk for diabetes. What should I know about preventing infection? Hepatitis B If you have a higher risk for hepatitis B, you should be screened for this virus. Talk with your health care provider to find out if you are at risk for hepatitis B infection. Hepatitis C Blood testing is recommended for: Everyone born from 1945 through 1965. Anyone with known risk factors for hepatitis C. Sexually transmitted infections (STIs) You should be screened each year for STIs, including gonorrhea and chlamydia, if: You are sexually active and are younger than 66 years of age. You are older than 66 years of age and your   health care provider tells you that you are at risk for this type of infection. Your sexual activity has changed since you were last screened, and you are at increased risk for chlamydia or gonorrhea. Ask your health care provider if you are at risk. Ask your health care provider about whether you are at high risk for HIV. Your health care provider  may recommend a prescription medicine to help prevent HIV infection. If you choose to take medicine to prevent HIV, you should first get tested for HIV. You should then be tested every 3 months for as long as you are taking the medicine. Follow these instructions at home: Alcohol use Do not drink alcohol if your health care provider tells you not to drink. If you drink alcohol: Limit how much you have to 0-2 drinks a day. Know how much alcohol is in your drink. In the [place] one drink equals one 12 oz bottle of beer (355 mL), one 5 oz glass of wine (148 mL), or one 1 oz glass of hard liquor (44 mL). Lifestyle Do not use any products that contain nicotine or tobacco. These products include cigarettes, chewing tobacco, and vaping devices, such as e-cigarettes. If you need help quitting, ask your health care provider. Do not use street drugs. Do not share needles. Ask your health care provider for help if you need support or information about quitting drugs. General instructions Schedule regular health, dental, and eye exams. Stay current with your vaccines. Tell your health care provider if: You often feel depressed. You have ever been abused or do not feel safe at home. Summary Adopting a healthy lifestyle and getting preventive care are important in promoting health and wellness. Follow your health care provider's instructions about healthy diet, exercising, and getting tested or screened for diseases. Follow your health care provider's instructions on monitoring your cholesterol and blood pressure. This information is not intended to replace advice given to you by your health care provider. Make sure you discuss any questions you have with your health care provider. Document Revised: 11/05/2020 Document Reviewed: 11/05/2020 Elsevier Patient Education  2023 Elsevier Inc.  

## 2022-04-11 ENCOUNTER — Other Ambulatory Visit: Payer: Self-pay | Admitting: Internal Medicine

## 2022-04-11 ENCOUNTER — Telehealth: Payer: Self-pay | Admitting: Internal Medicine

## 2022-04-11 ENCOUNTER — Ambulatory Visit: Payer: Medicare Other

## 2022-04-11 DIAGNOSIS — G250 Essential tremor: Secondary | ICD-10-CM

## 2022-04-11 DIAGNOSIS — Z0000 Encounter for general adult medical examination without abnormal findings: Secondary | ICD-10-CM

## 2022-04-11 NOTE — Telephone Encounter (Signed)
Medication Refill - Medication: gabapentin (NEURONTIN) 600 MG tablet, rOPINIRole (REQUIP) 2 MG tablet  Has the patient contacted their pharmacy? No. (Agent: If no, request that the patient contact the pharmacy for the refill. If patient does not wish to contact the pharmacy document the reason why and proceed with request.)   Preferred Pharmacy (with phone number or street name):  [address] AT [address]  [place] [facility] [place] [address]  Phone: [phone] Fax: [phone]  Hours: Not open 24 hours   Has the patient been seen for an appointment in the last year OR does the patient have an upcoming appointment? Yes.    Agent: Please be advised that RX refills may take up to 3 business days. We ask that you follow-up with your pharmacy.

## 2022-04-11 NOTE — Telephone Encounter (Signed)
Copied from [place] [id number]. Topic: Appointment Scheduling - Scheduling Inquiry for Clinic >> Apr 11, 2022 10:16 AM David Rhodes wrote: Reason for CRM: Pt is returning a missed call for an AWV appointment this morning.  Please return pts call to re-schedule.

## 2022-04-11 NOTE — Telephone Encounter (Signed)
Unable to refill per protocol, Rx request is too soon,. Last RF 09/24/21 for 90 and 2 RF, for both medications.E-Prescribing Status: Receipt confirmed by pharmacy (09/18/2021 2:06 PM EDT). Will refuse.   Requested Prescriptions  Pending Prescriptions Disp Refills  . gabapentin (NEURONTIN) 600 MG tablet 360 tablet 2     Neurology: Anticonvulsants - gabapentin Passed - 04/11/2022 11:16 AM      Passed - Cr in normal range and within 360 days    Creat  Date Value Ref Range Status  04/16/2018 0.76 0.70 - 1.25 mg/dL Final    Comment:    For patients >43 years of age, the reference limit for Creatinine is approximately 13% higher for people identified as African-American. .    Creatinine, Ser  Date Value Ref Range Status  11/28/2021 0.81 0.61 - 1.24 mg/dL Final         Passed - Completed PHQ-2 or PHQ-9 in the last 360 days      Passed - Valid encounter within last 12 months    Recent Outpatient Visits          5 months ago Severe persistent asthma, unspecified whether complicated   [place] [facility] at [facility], Nyoka Cowden, MD   11 months ago Annual physical exam   [place] [facility] at [facility], Nyoka Cowden, MD   1 year ago Current moderate episode of major depressive disorder without prior episode [facility])   [place] [facility] at [facility], Nyoka Cowden, MD   1 year ago Reducible right inguinal hernia   [place] [facility] at [facility], Nyoka Cowden, MD   1 year ago Urgency of urination   [place] [facility] at [facility], Nyoka Cowden, MD      Future Appointments            In 2 weeks Reubin Milan, MD [facility] at [facility]           . rOPINIRole (REQUIP) 2 MG tablet 180 tablet 2    Sig: Take 1 tablet (2 mg total) by mouth 2 (two) times  daily.     Neurology:  Parkinsonian Agents Failed - 04/11/2022 11:16 AM      Failed - Last BP in normal range    BP Readings from Last 1 Encounters:  03/19/22 (!) 153/103         Passed - Last Heart Rate in normal range    Pulse Readings from Last 1 Encounters:  03/19/22 78         Passed - Valid encounter within last 12 months    Recent Outpatient Visits          5 months ago Severe persistent asthma, unspecified whether complicated   [place] [facility] at [facility], Nyoka Cowden, MD   11 months ago Annual physical exam   [place] [facility] at [facility], Nyoka Cowden, MD   1 year ago Current moderate episode of major depressive disorder without prior episode [facility])   [place] [facility] at [facility], Nyoka Cowden, MD   1 year ago Reducible right inguinal hernia    Primary Care and Sports Medicine at [facility], Nyoka Cowden, MD   1 year  ago Urgency of urination   [place] [facility] at [facility], Jesse Sans, MD      Future Appointments            In 2 weeks Army Melia, Jesse Sans, MD [facility] and [facility] Medicine at [facility], [facility]

## 2022-04-12 DIAGNOSIS — J449 Chronic obstructive pulmonary disease, unspecified: Secondary | ICD-10-CM | POA: Diagnosis not present

## 2022-04-12 DIAGNOSIS — J439 Emphysema, unspecified: Secondary | ICD-10-CM | POA: Diagnosis not present

## 2022-04-12 DIAGNOSIS — J45909 Unspecified asthma, uncomplicated: Secondary | ICD-10-CM | POA: Diagnosis not present

## 2022-04-18 ENCOUNTER — Ambulatory Visit: Payer: Medicare Other

## 2022-04-25 ENCOUNTER — Ambulatory Visit (INDEPENDENT_AMBULATORY_CARE_PROVIDER_SITE_OTHER): Payer: Medicare Other | Admitting: Internal Medicine

## 2022-04-25 ENCOUNTER — Encounter: Payer: Self-pay | Admitting: Internal Medicine

## 2022-04-25 VITALS — BP 122/86 | HR 86 | Ht 72.0 in | Wt 154.0 lb

## 2022-04-25 DIAGNOSIS — Z1211 Encounter for screening for malignant neoplasm of colon: Secondary | ICD-10-CM

## 2022-04-25 DIAGNOSIS — K219 Gastro-esophageal reflux disease without esophagitis: Secondary | ICD-10-CM

## 2022-04-25 DIAGNOSIS — I10 Essential (primary) hypertension: Secondary | ICD-10-CM | POA: Diagnosis not present

## 2022-04-25 DIAGNOSIS — Z13220 Encounter for screening for lipoid disorders: Secondary | ICD-10-CM

## 2022-04-25 DIAGNOSIS — Z0000 Encounter for general adult medical examination without abnormal findings: Secondary | ICD-10-CM

## 2022-04-25 DIAGNOSIS — J439 Emphysema, unspecified: Secondary | ICD-10-CM

## 2022-04-25 DIAGNOSIS — G250 Essential tremor: Secondary | ICD-10-CM

## 2022-04-25 DIAGNOSIS — F321 Major depressive disorder, single episode, moderate: Secondary | ICD-10-CM

## 2022-04-25 MED ORDER — MONTELUKAST SODIUM 10 MG PO TABS: 10.0000 mg | ORAL_TABLET | Freq: Every day | ORAL | 3 refills | Status: DC

## 2022-04-25 MED ORDER — OMEPRAZOLE 20 MG PO CPDR: 20.0000 mg | DELAYED_RELEASE_CAPSULE | Freq: Every day | ORAL | 1 refills | Status: DC

## 2022-04-25 MED ORDER — GABAPENTIN 600 MG PO TABS: ORAL_TABLET | ORAL | 2 refills | Status: DC

## 2022-04-25 MED ORDER — SERTRALINE HCL 50 MG PO TABS: 50.0000 mg | ORAL_TABLET | Freq: Every day | ORAL | 1 refills | Status: DC

## 2022-04-25 MED ORDER — BACLOFEN 10 MG PO TABS: ORAL_TABLET | ORAL | 1 refills | Status: DC

## 2022-04-25 MED ORDER — LOSARTAN POTASSIUM 50 MG PO TABS: 50.0000 mg | ORAL_TABLET | Freq: Every day | ORAL | 1 refills | Status: DC

## 2022-04-25 NOTE — Progress Notes (Signed)
Date:  04/25/2022   Name:  David Rhodes   DOB:  Feb 26, 1956   MRN:  [id number]   Chief Complaint: Annual Exam David Rhodes is a 66 y.o. male who presents today for his Complete Annual Exam. He feels fairly well. He reports exercising - none. He reports he is sleeping fairly well.   Colonoscopy: none  Immunization History  Administered Date(s) Administered   PNEUMOCOCCAL CONJUGATE-20 03/18/2021   Pneumococcal Polysaccharide-23 04/14/2019   Tdap 03/31/2019   Health Maintenance Due  Topic Date Due   Medicare Annual Wellness (AWV)  03/18/2022    Lab Results  Component Value Date   PSA1 0.6 04/18/2020   PSA1 0.5 04/14/2019   PSA1 0.4 12/28/2014   PSA 0.3 11/03/2013    Hypertension This is a chronic problem. The problem is uncontrolled. Associated symptoms include shortness of breath. Pertinent negatives include no chest pain, headaches or palpitations. Past treatments include ACE inhibitors (but not currently taking).  Depression        This is a chronic problem.The problem is unchanged.  Associated symptoms include no fatigue, no appetite change, no myalgias and no headaches.  Past treatments include SSRIs - Selective serotonin reuptake inhibitors. Gastroesophageal Reflux He complains of heartburn, water brash and wheezing. He reports no abdominal pain, no chest pain or no choking. This is a recurrent problem. Pertinent negatives include no fatigue. He has tried a PPI (but not taking omeprazole daily) for the symptoms. The treatment provided moderate relief.  COPD - on Breo, singulair, O2, Duonebs  Lab Results  Component Value Date   NA 139 11/28/2021   K 4.5 11/28/2021   CO2 26 11/28/2021   GLUCOSE 102 (H) 11/28/2021   BUN 13 11/28/2021   CREATININE 0.81 11/28/2021   CALCIUM 10.2 11/28/2021   GFRNONAA >60 11/28/2021   Lab Results  Component Value Date   CHOL 119 04/22/2021   HDL 59 04/22/2021   LDLCALC 52 04/22/2021   TRIG 41 04/22/2021   CHOLHDL 2.0  04/22/2021   Lab Results  Component Value Date   TSH 1.019 04/22/2021   Lab Results  Component Value Date   HGBA1C 5.3 12/25/2018   Lab Results  Component Value Date   WBC 9.6 11/28/2021   HGB 15.8 11/28/2021   HCT 48.3 11/28/2021   MCV 88.5 11/28/2021   PLT 210 11/28/2021   Lab Results  Component Value Date   ALT 18 11/28/2021   AST 26 11/28/2021   ALKPHOS 72 11/28/2021   BILITOT 0.8 11/28/2021   No results found for: "25OHVITD2", "25OHVITD3", "VD25OH"   Review of Systems  Constitutional:  Negative for appetite change, chills, diaphoresis, fatigue and unexpected weight change.  HENT:  Negative for hearing loss, tinnitus, trouble swallowing and voice change.   Eyes:  Negative for visual disturbance.  Respiratory:  Positive for chest tightness, shortness of breath and wheezing. Negative for choking.   Cardiovascular:  Negative for chest pain, palpitations and leg swelling.  Gastrointestinal:  Positive for heartburn. Negative for abdominal pain, blood in stool, constipation and diarrhea.  Genitourinary:  Negative for difficulty urinating, dysuria and frequency.  Musculoskeletal:  Negative for back pain and myalgias. Arthralgias: left hand/thumb. Skin:  Negative for color change and rash.  Neurological:  Positive for tremors and weakness. Negative for dizziness, syncope, numbness and headaches.  Hematological:  Negative for adenopathy.  Psychiatric/Behavioral:  Positive for depression. Negative for dysphoric mood and sleep disturbance. The patient is not nervous/anxious.  Patient Active Problem List   Diagnosis Date Noted   Current moderate episode of major depressive disorder without prior episode (HCC) 02/26/2021   Primary osteoarthritis of first carpometacarpal joint of left hand 02/26/2021   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   Dependence on supplemental oxygen 04/18/2020   Leukocytosis 04/15/2019   Inguinal hernia recurrent bilateral 11/16/2018    Chronic left shoulder pain 10/05/2018   PICC (peripherally inserted central catheter) in place 04/16/2018   Essential tremor 10/18/2017   Cervical radiculopathy 07/23/2017   Bilateral temporomandibular joint pain 07/23/2017   Neck pain 05/19/2017   Medication monitoring encounter 09/11/2016   Pulmonary nodules 12/31/2015   COPD (chronic obstructive pulmonary disease) (HCC) 12/27/2015   Chronic cough 12/27/2015   Asthma 09/24/2015   Allergic rhinitis 11/11/2014   Nerve root pain 11/11/2014   Dyskinesia 11/11/2014   GERD (gastroesophageal reflux disease) 11/11/2014   Calcium blood increased 11/11/2014   Affective disorder (HCC) 11/11/2014   Essential hypertension 12/25/2011   Chronic back pain 12/25/2011    Allergies  Allergen Reactions   Penicillins Shortness Of Breath    PATIENT HAS HAD A PCN REACTION WITH IMMEDIATE RASH, FACIAL/TONGUE/THROAT SWELLING, SOB, OR LIGHTHEADEDNESS WITH HYPOTENSION:  #  #  YES  #  #  Has patient had a PCN reaction causing severe rash involving mucus membranes or skin necrosis: No Has patient had a PCN reaction that required hospitalization No Has patient had a PCN reaction occurring within the last 10 years: No If all of the above answers are "NO", then may proceed with Cephalosporin use. **tolerated cefazolin and cephalexin 03/2017    Past Surgical History:  Procedure Laterality Date   DEEP BRAIN STIMULATOR PLACEMENT     INGUINAL HERNIA REPAIR Bilateral 02/07/2014   Procedure:  BILATERAL INGUINAL HERNIA REPAIR;  Surgeon: Axel Filler, MD;  Location: [facility] OR;  Service: General;  Laterality: Bilateral;   INGUINAL HERNIA REPAIR Right 07/05/2021   INSERTION OF MESH Bilateral 02/07/2014   Procedure: INSERTION OF MESH;  Surgeon: Axel Filler, MD;  Location: [facility] OR;  Service: General;  Laterality: Bilateral;   NASAL SINUS SURGERY     PULSE GENERATOR IMPLANT Left 09/11/2016   Procedure: Left chest-Change implantable pulse generator battery;   Surgeon: Maeola Harman, MD;  Location: [facility] OR;  Service: Neurosurgery;  Laterality: Left;  Left chest-Change implantable pulse generator battery   PULSE GENERATOR IMPLANT Left 04/06/2018   Procedure: Revision of left chest implantable pulse generator, extension, and pocket adapter;  Surgeon: Maeola Harman, MD;  Location: [facility] OR;  Service: Neurosurgery;  Laterality: Left;   SUBTHALAMIC STIMULATOR BATTERY REPLACEMENT  12/09/2011   Procedure: SUBTHALAMIC STIMULATOR BATTERY REPLACEMENT;  Surgeon: Maeola Harman, MD;  Location: [facility] NEURO ORS;  Service: Neurosurgery;  Laterality: N/A;   deep brain stimulator, implantable pulse generator change   SUBTHALAMIC STIMULATOR BATTERY REPLACEMENT Left 04/21/2014   Procedure: Deep brain stimulator battery change;  Surgeon: Maeola Harman, MD;  Location: [facility] NEURO ORS;  Service: Neurosurgery;  Laterality: Left;  Deep brain stimulator battery change   SUBTHALAMIC STIMULATOR BATTERY REPLACEMENT N/A 08/10/2018   Procedure: Replacement of deep brain stimulator pulse generator/left abdomen with long lead extensions;  Surgeon: Maeola Harman, MD;  Location: [facility] OR;  Service: Neurosurgery;  Laterality: N/A;    Social History   Tobacco Use   Smoking status: Never    Passive exposure: Past   Smokeless tobacco: Current    Types: Chew   Tobacco comments:    Trying to taper off.  Too  expensive  Vaping Use   Vaping Use: Never used  Substance Use Topics   Alcohol use: No    Alcohol/week: 0.0 standard drinks of alcohol   Drug use: No     Medication list has been reviewed and updated.  Current Meds  Medication Sig   albuterol (VENTOLIN HFA) 108 (90 Base) MCG/ACT inhaler INHALE 2 PUFFS INTO THE LUNGS EVERY 4 HOURS AS NEEDED FOR WHEEZING OR SHORTNESS OF BREATH   baclofen (LIORESAL) 10 MG tablet TAKE 1 TABLET(10 MG) BY MOUTH THREE TIMES DAILY   fluticasone furoate-vilanterol (BREO ELLIPTA) 100-25 MCG/ACT AEPB Inhale 1 puff into the lungs daily.   gabapentin (NEURONTIN) 600 MG  tablet TAKE 1 TABLET BY MOUTH EVERY MORNING, 1 TABLET AT NOON, 1 TABLET EVERY EVENING AND 1 TABLET EVERY NIGHT AT BEDTIME   loratadine (CLARITIN) 10 MG tablet Take 1 tablet (10 mg total) by mouth daily.   meloxicam (MOBIC) 15 MG tablet TAKE 1 TABLET(15 MG) BY MOUTH DAILY   montelukast (SINGULAIR) 10 MG tablet Take 1 tablet (10 mg total) by mouth at bedtime. TAKE 1 TABLET(10 MG) BY MOUTH AT BEDTIME   omeprazole (PRILOSEC) 20 MG capsule TAKE 1 CAPSULE BY MOUTH DAILY (Patient taking differently: Take 20 mg by mouth daily.)   OXYGEN Inhale into the lungs. 3 L of oxygen   rOPINIRole (REQUIP) 2 MG tablet TAKE 1 TABLET BY MOUTH TWICE DAILY   sertraline (ZOLOFT) 50 MG tablet Take 1 tablet (50 mg total) by mouth daily.       04/25/2022    8:44 AM 10/21/2021    8:28 AM 04/22/2021    9:23 AM 02/26/2021   10:19 AM  GAD 7 : Generalized Anxiety Score  Nervous, Anxious, on Edge 3 0 0 0  Control/stop worrying 0 0 0 0  Worry too much - different things 0 0 0 0  Trouble relaxing 2 0 1 1  Restless 3 0 0 0  Easily annoyed or irritable 0 0 0 1  Afraid - awful might happen 0 0 0 0  Total GAD 7 Score 8 0 1 2  Anxiety Difficulty Extremely difficult  Not difficult at all        04/25/2022    8:42 AM 10/21/2021    8:28 AM 04/22/2021    9:23 AM  Depression screen PHQ 2/9  Decreased Interest 2 0 3  Down, Depressed, Hopeless 1 2 3   PHQ - 2 Score 3 2 6   Altered sleeping 1 1 2   Tired, decreased energy 2 2 3   Change in appetite 1 3 2   Feeling bad or failure about yourself  2 0 2  Trouble concentrating 1 0 0  Moving slowly or fidgety/restless 3 0 3  Suicidal thoughts 0 0 0  PHQ-9 Score 13 8 18   Difficult doing work/chores Very difficult Not difficult at all Somewhat difficult    BP Readings from Last 3 Encounters:  04/25/22 122/86  03/19/22 (!) 153/103  11/28/21 (!) 141/113    Physical Exam Vitals and nursing note reviewed.  Constitutional:      General: He is not in acute distress.     Appearance: He is well-developed. He is not ill-appearing.  HENT:     Head: Normocephalic and atraumatic.  Neck:     Vascular: No carotid bruit.  Cardiovascular:     Rate and Rhythm: Normal rate and regular rhythm.     Pulses: Normal pulses.     Heart sounds: No murmur heard. Pulmonary:  Effort: Pulmonary effort is normal. No respiratory distress.     Breath sounds: No wheezing or rhonchi.  Chest:  Breasts:    Right: Normal.     Left: Normal.  Musculoskeletal:     Right shoulder: No tenderness or bony tenderness. Normal range of motion.     Left shoulder: Normal. No tenderness or bony tenderness. Normal range of motion.     Cervical back: Normal range of motion.     Right hip: No tenderness. Normal range of motion.     Left hip: No tenderness. Normal range of motion.     Right lower leg: No edema.     Left lower leg: No edema.     Comments: Moderate OA changes left thumb MCP  Lymphadenopathy:     Cervical: No cervical adenopathy.  Skin:    General: Skin is warm and dry.     Capillary Refill: Capillary refill takes less than 2 seconds.     Findings: No rash.  Neurological:     Mental Status: He is alert and oriented to person, place, and time. Mental status is at baseline.     Gait: Gait normal.  Psychiatric:        Mood and Affect: Mood normal.        Behavior: Behavior normal.     Wt Readings from Last 3 Encounters:  04/25/22 154 lb (69.9 kg)  11/28/21 145 lb (65.8 kg)  10/21/21 149 lb (67.6 kg)    BP 122/86 (BP Location: Left Arm, Patient Position: Sitting, Cuff Size: Normal)   Pulse 86   Ht 6' (1.829 m)   Wt 154 lb (69.9 kg)   SpO2 95%   BMI 20.89 kg/m   Assessment and Plan: 1. Annual physical exam Stable exam He declines all immunizations  2. Colon cancer screening Declines screening  3. Essential hypertension BP not at goal - he was previously prescribed Losartan 50 mg but he has not been taking it due to being unfamiliar with it.  He will take  it if the directions and reason are one the label. - CBC with Differential/Platelet - Comprehensive metabolic panel - TSH - POCT urinalysis dipstick - losartan (COZAAR) 50 MG tablet; Take 1 tablet (50 mg total) by mouth daily. For high BP  Dispense: 90 tablet; Refill: 1  4. Pulmonary emphysema, unspecified emphysema type (HCC) Stable on O2 No recent chest infections or worsening of symptoms - montelukast (SINGULAIR) 10 MG tablet; Take 1 tablet (10 mg total) by mouth at bedtime. TAKE 1 TABLET(10 MG) BY MOUTH AT BEDTIME  Dispense: 90 tablet; Refill: 3  5. Gastroesophageal reflux disease without esophagitis Symptoms fairly well controlled on PPI but not taking regularly No red flag signs such as weight loss, n/v, melena Will continue omeprazole but recommend daily dosing. - CBC with Differential/Platelet - omeprazole (PRILOSEC) 20 MG capsule; Take 1 capsule (20 mg total) by mouth daily.  Dispense: 90 capsule; Refill: 1  6. Current moderate episode of major depressive disorder without prior episode (HCC) Clinically stable on current regimen with good control of symptoms, No SI or HI. Will continue current therapy. - TSH - sertraline (ZOLOFT) 50 MG tablet; Take 1 tablet (50 mg total) by mouth daily.  Dispense: 90 tablet; Refill: 1  7. Hypercalcemia Noted on admission in June with elevated PTH Will repeat labs and advise on follow up. - Parathyroid hormone, intact (no Ca)  8. Screening for lipid disorders - Lipid panel  9. Essential tremor Worse due to  deep brain simulator no operating currently He is trying to get an appointment with the Neurosurgeon - baclofen (LIORESAL) 10 MG tablet; TAKE 1 TABLET(10 MG) BY MOUTH THREE TIMES DAILY  Dispense: 90 tablet; Refill: 1 - gabapentin (NEURONTIN) 600 MG tablet; TAKE 1 TABLET BY MOUTH EVERY MORNING, 1 TABLET AT NOON, 1 TABLET EVERY EVENING AND 1 TABLET EVERY NIGHT AT BEDTIME  Dispense: 360 tablet; Refill: 2   Partially dictated using [employer]  software. Any errors are unintentional.  Bari EdwardLaura Felita Bump, MD [facility]  04/25/2022

## 2022-04-26 LAB — LIPID PANEL
Chol/HDL Ratio: 2.5 ratio (ref 0.0–5.0)
Cholesterol, Total: 156 mg/dL (ref 100–199)
HDL: 63 mg/dL (ref >39)
LDL Chol Calc (NIH): 82 mg/dL (ref 0–99)
Triglycerides: 51 mg/dL (ref 0–149)
VLDL Cholesterol Cal: 11 mg/dL (ref 5–40)

## 2022-04-26 LAB — CBC WITH DIFFERENTIAL/PLATELET
Basophils Absolute: 0.1 10*3/uL (ref 0.0–0.2)
Basos: 1 %
EOS (ABSOLUTE): 0.7 10*3/uL — ABNORMAL HIGH (ref 0.0–0.4)
Eos: 10 %
Hematocrit: 48.5 % (ref 37.5–51.0)
Hemoglobin: 15.8 g/dL (ref 13.0–17.7)
Immature Grans (Abs): 0 10*3/uL (ref 0.0–0.1)
Immature Granulocytes: 0 %
Lymphocytes Absolute: 2.7 10*3/uL (ref 0.7–3.1)
Lymphs: 38 %
MCH: 28.3 pg (ref 26.6–33.0)
MCHC: 32.6 g/dL (ref 31.5–35.7)
MCV: 87 fL (ref 79–97)
Monocytes Absolute: 0.6 10*3/uL (ref 0.1–0.9)
Monocytes: 9 %
Neutrophils Absolute: 3.1 10*3/uL (ref 1.4–7.0)
Neutrophils: 42 %
Platelets: 191 10*3/uL (ref 150–450)
RBC: 5.59 x10E6/uL (ref 4.14–5.80)
RDW: 13.8 % (ref 11.6–15.4)
WBC: 7.2 10*3/uL (ref 3.4–10.8)

## 2022-04-26 LAB — COMPREHENSIVE METABOLIC PANEL
ALT: 19 IU/L (ref 0–44)
AST: 23 IU/L (ref 0–40)
Albumin/Globulin Ratio: 1.7 (ref 1.2–2.2)
Albumin: 4.4 g/dL (ref 3.9–4.9)
Alkaline Phosphatase: 115 IU/L (ref 44–121)
BUN/Creatinine Ratio: 16 (ref 10–24)
BUN: 16 mg/dL (ref 8–27)
Bilirubin Total: 0.4 mg/dL (ref 0.0–1.2)
CO2: 25 mmol/L (ref 20–29)
Calcium: 10.8 mg/dL — ABNORMAL HIGH (ref 8.6–10.2)
Chloride: 104 mmol/L (ref 96–106)
Creatinine, Ser: 1 mg/dL (ref 0.76–1.27)
Globulin, Total: 2.6 g/dL (ref 1.5–4.5)
Glucose: 92 mg/dL (ref 70–99)
Potassium: 4.8 mmol/L (ref 3.5–5.2)
Sodium: 142 mmol/L (ref 134–144)
Total Protein: 7 g/dL (ref 6.0–8.5)
eGFR: 83 mL/min/{1.73_m2} (ref >59)

## 2022-04-26 LAB — PARATHYROID HORMONE, INTACT (NO CA): PTH: 76 pg/mL — ABNORMAL HIGH (ref 15–65)

## 2022-04-26 LAB — TSH: TSH: 2.89 u[IU]/mL (ref 0.450–4.500)

## 2022-04-30 ENCOUNTER — Ambulatory Visit: Payer: Medicare Other

## 2022-04-30 NOTE — Progress Notes (Signed)
Called pt could not leave VM.   PEC may give results if patient returns call - CRM created.  KP

## 2022-05-01 ENCOUNTER — Other Ambulatory Visit: Payer: Self-pay

## 2022-05-01 DIAGNOSIS — E215 Disorder of parathyroid gland, unspecified: Secondary | ICD-10-CM

## 2022-05-13 DIAGNOSIS — J439 Emphysema, unspecified: Secondary | ICD-10-CM | POA: Diagnosis not present

## 2022-05-13 DIAGNOSIS — J45909 Unspecified asthma, uncomplicated: Secondary | ICD-10-CM | POA: Diagnosis not present

## 2022-05-13 DIAGNOSIS — J449 Chronic obstructive pulmonary disease, unspecified: Secondary | ICD-10-CM | POA: Diagnosis not present

## 2022-05-14 ENCOUNTER — Telehealth: Payer: Self-pay | Admitting: Neurology

## 2022-05-14 DIAGNOSIS — R251 Tremor, unspecified: Secondary | ICD-10-CM | POA: Diagnosis not present

## 2022-05-14 DIAGNOSIS — Z4542 Encounter for adjustment and management of neuropacemaker (brain) (peripheral nerve) (spinal cord): Secondary | ICD-10-CM | POA: Diagnosis not present

## 2022-05-14 NOTE — Telephone Encounter (Signed)
Phone call from Dr. Jake Samples received.  Pt showed up in his office wanting battery change.  Dr. Jake Samples had never seen patient and wanted advice on battery type.  Discussed recharger with him but pt very noncompliant and pt may not be compliant enough to charge the device.  Dr. Jake Samples doesn't know how to set up device after insertion.  I told him if patient has activa [place] in him currently to put that in again and I am happy to see the patient after surgery.  Told him that, in my experience, the patient will likely get programmed by [employer] rep in post op recovery and will cancel follow up appointments here and won't show back up until battery is dead again and will go to the ER then unfortunately.  Told him to let me know if pt needs f/u here and happy to schedule that.

## 2022-05-30 ENCOUNTER — Other Ambulatory Visit: Payer: Self-pay | Admitting: Neurological Surgery

## 2022-06-02 ENCOUNTER — Other Ambulatory Visit: Payer: Self-pay | Admitting: Neurological Surgery

## 2022-06-06 ENCOUNTER — Other Ambulatory Visit: Payer: Self-pay

## 2022-06-06 ENCOUNTER — Ambulatory Visit (HOSPITAL_COMMUNITY): Payer: Medicare Other | Admitting: Physician Assistant

## 2022-06-06 ENCOUNTER — Encounter (HOSPITAL_COMMUNITY): Payer: Self-pay | Admitting: Neurological Surgery

## 2022-06-06 NOTE — Progress Notes (Signed)
Mr David Rhodes denies chest pain. Patient denies            s/s of covid.  Mr. David Rhodes has COPD and uses oxygen at 3 liters, most of the time, patient said, " I don't want to wear oxygen all the time, but it seems I need to more and more."  Mr. David Rhodes neighbor was in the home and wrote down the phone for the patient, I asked the neighbor if  Mr. David Rhodes breathing like he always does, she said that he is more short of breath than some times.  Mr. David Rhodes return to the phone, he  said that this is his life now, sometimes breathing is not so hard, and then at times it is a bit harder and then I have these "spells that even talking is hard. Patient confirmed that he spell he is having is like the ones he would go to the hospital?   Mr. David Rhodes said, "yes."  I  asked patient why he has not gone to the hospital, he said "because they would want to keep me and I need to have this surgery on Monday." I asked Mr. David Rhodes if his Drs, .PCP is Dr. Namon Cirri, Cardiologist is Dr, Haze Justin, Pulmonologist is Dr. Orland Dec; I asked if any of them knew he was scheduled for surgery, " I have not told them."  Mr. David Rhodes plans to drive himself to and from the hospital.  Patient does not have anyone to drive him or stay with him. I told patient he is pending the night here, patient began upset again, he said no one had told him that.  I Spoke with Zannie Cove about patient and the condition he is in at this time.

## 2022-06-08 NOTE — Anesthesia Preprocedure Evaluation (Addendum)
Anesthesia Evaluation    Reviewed: Allergy & Precautions, Patient's Chart, lab work & pertinent test results, Unable to perform ROS - Chart review only  Airway        Dental   Pulmonary shortness of breath, asthma , pneumonia, COPD          Cardiovascular hypertension, Pt. on medications      Neuro/Psych  Headaches PSYCHIATRIC DISORDERS Anxiety Depression       GI/Hepatic Neg liver ROS,GERD  ,,  Endo/Other  negative endocrine ROS    Renal/GU negative Renal ROS     Musculoskeletal  (+) Arthritis ,    Abdominal   Peds  Hematology negative hematology ROS (+)   Anesthesia Other Findings   Reproductive/Obstetrics                             Anesthesia Physical Anesthesia Plan  ASA: 3  Anesthesia Plan: General   Post-op Pain Management: Tylenol PO (pre-op)* and Gabapentin PO (pre-op)*   Induction: Intravenous  PONV Risk Score and Plan: 4 or greater and Ondansetron, Dexamethasone, Treatment may vary due to age or medical condition and Midazolam  Airway Management Planned: Oral ETT and LMA  Additional Equipment:   Intra-op Plan:   Post-operative Plan: Extubation in OR  Informed Consent:   Plan Discussed with:   Anesthesia Plan Comments: (Pt with clear worsening of pulmonary symptoms, patient readily admits this. He has pronounced, bilateral expiratory wheezing. He has been reluctant to seek evaluation because he didn't want to delay surgery. I don't see where he was seen recently by his pulmonologist and he cannot remember when he was last seen. It appears to be 04/2021 based on tests performed then. I spoke to the patient about his pronounced increased risk of pulmonary complications based on his current symptoms and lack of recent f/u with pulmonology. )        Anesthesia Quick Evaluation

## 2022-06-09 ENCOUNTER — Encounter (HOSPITAL_COMMUNITY): Payer: Self-pay | Admitting: Neurological Surgery

## 2022-06-09 ENCOUNTER — Encounter (HOSPITAL_COMMUNITY)
Admission: RE | Disposition: A | Discharge status: Home / Self Care | Payer: Self-pay | Source: Home / Self Care | Attending: Neurological Surgery

## 2022-06-09 ENCOUNTER — Ambulatory Visit (HOSPITAL_COMMUNITY)
Admission: RE | Admit: 2022-06-09 | Discharge: 2022-06-09 | Disposition: A | Discharge status: Home / Self Care | Payer: Medicare Other | Attending: Neurological Surgery | Admitting: Neurological Surgery

## 2022-06-09 ENCOUNTER — Other Ambulatory Visit: Payer: Self-pay | Admitting: Internal Medicine

## 2022-06-09 DIAGNOSIS — Z539 Procedure and treatment not carried out, unspecified reason: Secondary | ICD-10-CM | POA: Insufficient documentation

## 2022-06-09 HISTORY — DX: Unspecified osteoarthritis, unspecified site: M19.90

## 2022-06-09 SURGERY — SUBTHALAMIC STIMULATOR BATTERY REPLACEMENT
Anesthesia: General

## 2022-06-09 MED ORDER — GABAPENTIN 300 MG PO CAPS
300.0000 mg | ORAL_CAPSULE | Freq: Once | ORAL | Status: DC
  Filled 2022-06-09: qty 1

## 2022-06-09 MED ORDER — DEXAMETHASONE SODIUM PHOSPHATE 10 MG/ML IJ SOLN
INTRAMUSCULAR | Status: AC
  Filled 2022-06-09: qty 1

## 2022-06-09 MED ORDER — CHLORHEXIDINE GLUCONATE CLOTH 2 % EX PADS: 6.0000 | MEDICATED_PAD | Freq: Once | CUTANEOUS | Status: DC

## 2022-06-09 MED ORDER — PROPOFOL 10 MG/ML IV BOLUS
INTRAVENOUS | Status: AC
  Filled 2022-06-09: qty 20

## 2022-06-09 MED ORDER — MIDAZOLAM HCL 2 MG/2ML IJ SOLN
INTRAMUSCULAR | Status: AC
  Filled 2022-06-09: qty 2

## 2022-06-09 MED ORDER — VANCOMYCIN HCL IN DEXTROSE 1-5 GM/200ML-% IV SOLN
1000.0000 mg | INTRAVENOUS | Status: DC
  Filled 2022-06-09: qty 200

## 2022-06-09 MED ORDER — ONDANSETRON HCL 4 MG/2ML IJ SOLN
INTRAMUSCULAR | Status: AC
  Filled 2022-06-09: qty 2

## 2022-06-09 MED ORDER — LIDOCAINE 2% (20 MG/ML) 5 ML SYRINGE
INTRAMUSCULAR | Status: AC
  Filled 2022-06-09: qty 5

## 2022-06-09 MED ORDER — ROCURONIUM BROMIDE 10 MG/ML (PF) SYRINGE
PREFILLED_SYRINGE | INTRAVENOUS | Status: AC
  Filled 2022-06-09: qty 10

## 2022-06-09 MED ORDER — CHLORHEXIDINE GLUCONATE 0.12 % MT SOLN
15.0000 mL | Freq: Once | OROMUCOSAL | Status: DC
  Filled 2022-06-09: qty 15

## 2022-06-09 MED ORDER — LACTATED RINGERS IV SOLN: INTRAVENOUS | Status: DC

## 2022-06-09 MED ORDER — ACETAMINOPHEN 500 MG PO TABS
1000.0000 mg | ORAL_TABLET | Freq: Once | ORAL | Status: DC
  Filled 2022-06-09: qty 2

## 2022-06-09 MED ORDER — FENTANYL CITRATE (PF) 250 MCG/5ML IJ SOLN
INTRAMUSCULAR | Status: AC
  Filled 2022-06-09: qty 5

## 2022-06-09 MED ORDER — ORAL CARE MOUTH RINSE: 15.0000 mL | Freq: Once | OROMUCOSAL | Status: DC

## 2022-06-09 NOTE — Progress Notes (Signed)
When asked about the FYI in his chart related to blood refusal, pt states he is unaware of this and does not refuse blood. He states he will accept a blood transfusion if needed.

## 2022-06-09 NOTE — Progress Notes (Signed)
Procedure cancelled per anesthesia.

## 2022-06-09 NOTE — Progress Notes (Signed)
Patient complaining of worsening shortness of breath and difficulty breathing.  States he has not been seen by his pulmonologist in quite some time.  His lungs unfortunately did not sound good and therefore I offered him to be seen by hospitalist and pulmonologist here or he can follow-up as an outpatient with his pulmonologist.  He would like to follow-up with his pulmonologist as outpatient and we will reschedule his surgery once he is medically cleared.

## 2022-06-12 DIAGNOSIS — J449 Chronic obstructive pulmonary disease, unspecified: Secondary | ICD-10-CM | POA: Diagnosis not present

## 2022-06-12 DIAGNOSIS — J45909 Unspecified asthma, uncomplicated: Secondary | ICD-10-CM | POA: Diagnosis not present

## 2022-06-12 DIAGNOSIS — J439 Emphysema, unspecified: Secondary | ICD-10-CM | POA: Diagnosis not present

## 2022-06-19 ENCOUNTER — Other Ambulatory Visit: Payer: Self-pay

## 2022-06-19 ENCOUNTER — Encounter (HOSPITAL_COMMUNITY): Payer: Self-pay

## 2022-06-19 ENCOUNTER — Emergency Department (HOSPITAL_COMMUNITY)
Admission: EM | Admit: 2022-06-19 | Discharge: 2022-06-19 | Disposition: A | Discharge status: Home / Self Care | Payer: Medicare Other | Attending: Emergency Medicine | Admitting: Emergency Medicine

## 2022-06-19 ENCOUNTER — Emergency Department (HOSPITAL_COMMUNITY): Payer: Medicare Other

## 2022-06-19 DIAGNOSIS — R0602 Shortness of breath: Secondary | ICD-10-CM | POA: Diagnosis not present

## 2022-06-19 DIAGNOSIS — Z7951 Long term (current) use of inhaled steroids: Secondary | ICD-10-CM | POA: Insufficient documentation

## 2022-06-19 DIAGNOSIS — R059 Cough, unspecified: Secondary | ICD-10-CM | POA: Diagnosis not present

## 2022-06-19 DIAGNOSIS — Z1152 Encounter for screening for COVID-19: Secondary | ICD-10-CM | POA: Diagnosis not present

## 2022-06-19 DIAGNOSIS — J441 Chronic obstructive pulmonary disease with (acute) exacerbation: Secondary | ICD-10-CM | POA: Insufficient documentation

## 2022-06-19 LAB — CBC WITH DIFFERENTIAL/PLATELET
Abs Immature Granulocytes: 0.01 10*3/uL (ref 0.00–0.07)
Basophils Absolute: 0.1 10*3/uL (ref 0.0–0.1)
Basophils Relative: 1 %
Eosinophils Absolute: 1.4 10*3/uL — ABNORMAL HIGH (ref 0.0–0.5)
Eosinophils Relative: 15 %
HCT: 45.4 % (ref 39.0–52.0)
Hemoglobin: 15 g/dL (ref 13.0–17.0)
Immature Granulocytes: 0 %
Lymphocytes Relative: 36 %
Lymphs Abs: 3.3 10*3/uL (ref 0.7–4.0)
MCH: 28.8 pg (ref 26.0–34.0)
MCHC: 33 g/dL (ref 30.0–36.0)
MCV: 87.3 fL (ref 80.0–100.0)
Monocytes Absolute: 0.9 10*3/uL (ref 0.1–1.0)
Monocytes Relative: 9 %
Neutro Abs: 3.7 10*3/uL (ref 1.7–7.7)
Neutrophils Relative %: 39 %
Platelets: 207 10*3/uL (ref 150–400)
RBC: 5.2 MIL/uL (ref 4.22–5.81)
RDW: 13.2 % (ref 11.5–15.5)
WBC: 9.3 10*3/uL (ref 4.0–10.5)
nRBC: 0 % (ref 0.0–0.2)

## 2022-06-19 LAB — RESP PANEL BY RT-PCR (RSV, FLU A&B, COVID)  RVPGX2
Influenza A by PCR: NEGATIVE
Influenza B by PCR: NEGATIVE
Resp Syncytial Virus by PCR: NEGATIVE
SARS Coronavirus 2 by RT PCR: NEGATIVE

## 2022-06-19 LAB — BASIC METABOLIC PANEL
Anion gap: 7 (ref 5–15)
BUN: 12 mg/dL (ref 8–23)
CO2: 25 mmol/L (ref 22–32)
Calcium: 10.7 mg/dL — ABNORMAL HIGH (ref 8.9–10.3)
Chloride: 106 mmol/L (ref 98–111)
Creatinine, Ser: 0.93 mg/dL (ref 0.61–1.24)
GFR, Estimated: >60 mL/min (ref >60)
Glucose, Bld: 133 mg/dL — ABNORMAL HIGH (ref 70–99)
Potassium: 4 mmol/L (ref 3.5–5.1)
Sodium: 138 mmol/L (ref 135–145)

## 2022-06-19 LAB — TROPONIN I (HIGH SENSITIVITY)
Troponin I (High Sensitivity): 4 ng/L (ref <18)
Troponin I (High Sensitivity): 4 ng/L (ref <18)

## 2022-06-19 MED ORDER — SODIUM CHLORIDE 0.9 % IV BOLUS
1000.0000 mL | Freq: Once | INTRAVENOUS | Status: AC
  Administered 2022-06-19: 1000 mL via INTRAVENOUS

## 2022-06-19 MED ORDER — PREDNISONE 20 MG PO TABS: ORAL_TABLET | ORAL | 0 refills | Status: DC

## 2022-06-19 MED ORDER — METHYLPREDNISOLONE SODIUM SUCC 125 MG IJ SOLR
125.0000 mg | Freq: Once | INTRAMUSCULAR | Status: AC
  Administered 2022-06-19: 125 mg via INTRAVENOUS
  Filled 2022-06-19: qty 2

## 2022-06-19 MED ORDER — IPRATROPIUM-ALBUTEROL 0.5-2.5 (3) MG/3ML IN SOLN
3.0000 mL | Freq: Once | RESPIRATORY_TRACT | Status: AC
  Administered 2022-06-19: 3 mL via RESPIRATORY_TRACT
  Filled 2022-06-19: qty 3

## 2022-06-19 MED ORDER — ALBUTEROL SULFATE (2.5 MG/3ML) 0.083% IN NEBU: 2.5000 mg | INHALATION_SOLUTION | Freq: Four times a day (QID) | RESPIRATORY_TRACT | 1 refills | Status: DC | PRN

## 2022-06-19 MED ORDER — ALBUTEROL SULFATE (2.5 MG/3ML) 0.083% IN NEBU
10.0000 mg/h | INHALATION_SOLUTION | Freq: Once | RESPIRATORY_TRACT | Status: AC
  Administered 2022-06-19: 10 mg/h via RESPIRATORY_TRACT
  Filled 2022-06-19: qty 12

## 2022-06-19 NOTE — Discharge Instructions (Addendum)
Use your albuterol nebulizer every 4 hours as needed for cough, shortness of breath, wheezing.  Start the steroids (prednisone) tomorrow, 12/22.  If at any point you develop fever, coughing up blood, new or worsening shortness of breath, or any other new/concerning symptoms then return to the ER or call 911.

## 2022-06-19 NOTE — ED Triage Notes (Signed)
PT arrived POV from home c/o SHOB x several weeks but has gradually gotten worse. Pt was suppose to have surgery next week but they canceled because of his breathing and lungs and was told he needed to take care of that first. Pt wears 3L at baseline.

## 2022-06-19 NOTE — ED Provider Triage Note (Signed)
Emergency Medicine Provider Triage Evaluation Note  ARNET HOFFERBER , a 66 y.o. male  was evaluated in triage.  Pt complains of SOB.  Hx of COPD-- initially used home PRN O2 but now requiring it more often than not.  Has been coughing, some production of mucous (beige/white color).  With more talking/walking/exertion he gets more SOB.  Does do nebs at home, last one around 2200 last evening.  Review of Systems  Positive: SOB, cough Negative: fever  Physical Exam  BP (!) 156/113 (BP Location: Right Arm)   Pulse 92   Temp 97.7 F (36.5 C)   Resp 19   SpO2 99%  Gen:   Awake, no distress   Resp:  Normal effort  MSK:   Moves extremities without difficulty  Other:  On 3L supplemental O2, speaking in shortened sentences, does seem to get winded easily, some coarse breath sounds noted  Medical Decision Making  Medically screening exam initiated at 4:03 AM.  Appropriate orders placed.  [name] was informed that the remainder of the evaluation will be completed by another provider, this initial triage assessment does not replace that evaluation, and the importance of remaining in the ED until their evaluation is complete.  SOB-- hx of COPD, now relying on oxygen at all times.  3L usually.  Has had cough recently.  Will check labs, covid/flu screen, CXR.  Duoneb ordered.   Garlon Hatchet, PA-C 06/19/22 [phone]

## 2022-06-19 NOTE — ED Provider Notes (Signed)
[facility] EMERGENCY DEPARTMENT Provider Note   CSN: [id number] Arrival date & time: 06/19/22  0345     History  Chief Complaint  Patient presents with   Shortness of Breath    David Rhodes is a 66 y.o. male.  HPI 66 year old male presents with shortness of breath.  History is limited as he is quite dyspneic when I am talking to him.  He states has been having a cough and shortness of breath for a long time.  He has chronic COPD and uses home oxygen.  However symptoms are getting worse recently.  He is having chest pressure/heaviness.  Cough is productive.  He denies fevers or leg swelling.  He was given a DuoNeb in triage and feels a little bit better but now is getting worse.  Home Medications Prior to Admission medications   Medication Sig Start Date End Date Taking? Authorizing Provider  albuterol (PROVENTIL) (2.5 MG/3ML) 0.083% nebulizer solution Take 3 mLs (2.5 mg total) by nebulization every 6 (six) hours as needed for wheezing or shortness of breath. 06/19/22  Yes Pricilla Loveless, MD  predniSONE (DELTASONE) 20 MG tablet 2 tabs po daily x 4 days 06/20/22  Yes Pricilla Loveless, MD  albuterol (VENTOLIN HFA) 108 (90 Base) MCG/ACT inhaler INHALE 2 PUFFS INTO THE LUNGS EVERY 4 HOURS AS NEEDED FOR WHEEZING OR SHORTNESS OF BREATH 04/28/21   Reubin Milan, MD  baclofen (LIORESAL) 10 MG tablet TAKE 1 TABLET(10 MG) BY MOUTH THREE TIMES DAILY 04/25/22   Reubin Milan, MD  fluticasone furoate-vilanterol (BREO ELLIPTA) 100-25 MCG/ACT AEPB Inhale 1 puff into the lungs daily. 04/30/21   Reubin Milan, MD  gabapentin (NEURONTIN) 600 MG tablet TAKE 1 TABLET BY MOUTH EVERY MORNING, 1 TABLET AT NOON, 1 TABLET EVERY EVENING AND 1 TABLET EVERY NIGHT AT BEDTIME 04/25/22   Reubin Milan, MD  loratadine (CLARITIN) 10 MG tablet Take 1 tablet (10 mg total) by mouth daily. 10/13/17   Enedina Finner, MD  losartan (COZAAR) 50 MG tablet Take 1 tablet (50 mg total) by mouth  daily. For high BP 04/25/22   Reubin Milan, MD  meloxicam (MOBIC) 15 MG tablet TAKE 1 TABLET(15 MG) BY MOUTH DAILY 04/15/21   Reubin Milan, MD  montelukast (SINGULAIR) 10 MG tablet Take 1 tablet (10 mg total) by mouth at bedtime. TAKE 1 TABLET(10 MG) BY MOUTH AT BEDTIME 04/25/22   Reubin Milan, MD  omeprazole (PRILOSEC) 20 MG capsule Take 1 capsule (20 mg total) by mouth daily. 04/25/22   Reubin Milan, MD  OXYGEN Inhale into the lungs. 3 L of oxygen    [provider]  rOPINIRole (REQUIP) 2 MG tablet TAKE 1 TABLET BY MOUTH TWICE DAILY 06/09/22   Reubin Milan, MD  sertraline (ZOLOFT) 50 MG tablet Take 1 tablet (50 mg total) by mouth daily. 04/25/22   Reubin Milan, MD      Allergies    Penicillins    Review of Systems   Review of Systems  Constitutional:  Negative for fever.  Respiratory:  Positive for cough and shortness of breath.   Cardiovascular:  Positive for chest pain. Negative for leg swelling.    Physical Exam Updated Vital Signs BP (!) 145/98   Pulse 83   Temp 97.8 F (36.6 C) (Oral)   Resp 17   SpO2 100%  Physical Exam Vitals and nursing note reviewed.  Constitutional:      Appearance: He is well-developed. He  is not diaphoretic.     Comments: Leaning forward, clearly short of breath. Speaks in a couple word sentences  HENT:     Head: Normocephalic and atraumatic.  Cardiovascular:     Rate and Rhythm: Normal rate and regular rhythm.     Heart sounds: Normal heart sounds.  Pulmonary:     Effort: Tachypnea and accessory muscle usage present.     Breath sounds: Wheezing (diffuse, expiratory) present.  Abdominal:     Palpations: Abdomen is soft.     Tenderness: There is no abdominal tenderness.  Musculoskeletal:     Right lower leg: No edema.     Left lower leg: No edema.  Skin:    General: Skin is warm and dry.  Neurological:     Mental Status: He is alert.     ED Results / Procedures / Treatments   Labs (all labs  ordered are listed, but only abnormal results are displayed) Labs Reviewed  CBC WITH DIFFERENTIAL/PLATELET - Abnormal; Notable for the following components:      Result Value   Eosinophils Absolute 1.4 (*)    All other components within normal limits  BASIC METABOLIC PANEL - Abnormal; Notable for the following components:   Glucose, Bld 133 (*)    Calcium 10.7 (*)    All other components within normal limits  RESP PANEL BY RT-PCR (RSV, FLU A&B, COVID)  RVPGX2  TROPONIN I (HIGH SENSITIVITY)  TROPONIN I (HIGH SENSITIVITY)    EKG None  Radiology DG Chest 2 View  Result Date: 06/19/2022 CLINICAL DATA:  Shortness of breath and cough EXAM: CHEST - 2 VIEW COMPARISON:  11/28/2021 FINDINGS: Normal heart size and mediastinal contours. No acute infiltrate or edema. No effusion or pneumothorax. Scoliosis. No acute osseous findings. Generator wires over the left chest. IMPRESSION: No active cardiopulmonary disease. Electronically Signed   By: Tiburcio Pea M.D.   On: 06/19/2022 05:25    Procedures Procedures    Medications Ordered in ED Medications  ipratropium-albuterol (DUONEB) 0.5-2.5 (3) MG/3ML nebulizer solution 3 mL (3 mLs Nebulization Given 06/19/22 0414)  methylPREDNISolone sodium succinate (SOLU-MEDROL) 125 mg/2 mL injection 125 mg (125 mg Intravenous Given 06/19/22 1001)  albuterol (PROVENTIL) (2.5 MG/3ML) 0.083% nebulizer solution (10 mg/hr Nebulization Given 06/19/22 0942)  sodium chloride 0.9 % bolus 1,000 mL (0 mLs Intravenous Stopped 06/19/22 1213)    ED Course/ Medical Decision Making/ A&P                           Medical Decision Making Amount and/or Complexity of Data Reviewed Labs: ordered.    Details: Normal WBC, normal troponins. Covid/flu negative Radiology: independent interpretation performed.    Details: No Pneumonia ECG/medicine tests: independent interpretation performed.    Details: No ischemia  Risk Prescription drug management.   Patient  presents with what appears to be a COPD exacerbation.  Diffuse wheezing and increased work of breathing.  He did not look great when I first saw him though he has dramatically turned around with a continuous neb and Solu-Medrol.  He now feels well enough for discharge.  He was observed after the neb and then later was ambulated with his home oxygen.  He has done well and feels stable for discharge.  I think this is reasonable and there is no obvious pneumonia is found like this has been a waxing and waning problem for a while.  Will give a steroid burst and refill his nebulized albuterol.  He otherwise appears stable for discharge.  I doubt ACS, PE, pneumonia, etc.  Given return precautions.        Final Clinical Impression(s) / ED Diagnoses Final diagnoses:  COPD exacerbation (HCC)    Rx / DC Orders ED Discharge Orders          Ordered    predniSONE (DELTASONE) 20 MG tablet        06/19/22 1307    albuterol (PROVENTIL) (2.5 MG/3ML) 0.083% nebulizer solution  Every 6 hours PRN        06/19/22 1307              Pricilla Loveless, MD 06/19/22 1531

## 2022-06-25 ENCOUNTER — Telehealth: Payer: Self-pay | Admitting: *Deleted

## 2022-06-25 NOTE — Patient Outreach (Signed)
  Care Coordination [facility] Note Transition Care Management Follow-up Telephone Call Date of discharge and from where: ED [id number] [facility] How have you been since you were released from the hospital? I have been relapsing come.Patient is very short of breath when talking Any questions or concerns? Yes Patient has not followed up with PCP, Dr Dawley or pulmonologist Dr Gerre Couch Items Reviewed: Did the pt receive and understand the discharge instructions provided? Yes  Medications obtained and verified? Yes  Other? No  Any new allergies since your discharge? No  Dietary orders reviewed? No Do you have support at home? Yes   Home Care and Equipment/Supplies: Were home health services ordered? no If so, what is the name of the agency? N/a  Has the agency set up a time to come to the patient's home? not applicable Were any new equipment or medical supplies ordered?  No What is the name of the medical supply agency? N/a Were you able to get the supplies/equipment? not applicable Do you have any questions related to the use of the equipment or supplies? No  Functional Questionnaire: (I = Independent and D = Dependent) ADLs: I patient is having to take frequent rest periods due to the shortness of breath  Bathing/Dressing- I  Meal Prep- I  Eating- I  Maintaining continence- I  Transferring/Ambulation- I  Managing Meds- I  Follow up appointments reviewed:  PCP Hospital f/u appt confirmed? No  Scheduled to see Patient refused for RN to make the appointment. He wanted to make appt self. Specialist Hospital f/u appt confirmed? No  Patient had not followed up with Dr Dawley or a Pulmonologist. RN had the patient to pull up the name of pulmonologist Dr Gerre Couch and patient stated he would call. He refused the RN to call.  Are transportation arrangements needed? No  If their condition worsens, is the pt aware to call PCP or go to the Emergency Dept.? Yes. Rn reiterated the  importance Was the patient provided with contact information for the PCP's office or ED? Yes. Was to pt encouraged to call back with questions or concerns? Yes  SDOH assessments and interventions completed:   Yes SDOH Interventions Today    Flowsheet Row Most Recent Value  SDOH Interventions   Food Insecurity Interventions Intervention Not Indicated  Housing Interventions Intervention Not Indicated  Transportation Interventions Intervention Not Indicated       Care Coordination Interventions:  RN made sure patient had the numbers needed to call PCP and Pulmonologist. RN stressed the importance of him following up since he refused her to call. And had not called when discharged.     Encounter Outcome:  Pt. Visit Completed    Gean Maidens BSN RN [facility] [phone]

## 2022-07-13 DIAGNOSIS — J449 Chronic obstructive pulmonary disease, unspecified: Secondary | ICD-10-CM | POA: Diagnosis not present

## 2022-07-13 DIAGNOSIS — J439 Emphysema, unspecified: Secondary | ICD-10-CM | POA: Diagnosis not present

## 2022-07-13 DIAGNOSIS — J45909 Unspecified asthma, uncomplicated: Secondary | ICD-10-CM | POA: Diagnosis not present

## 2022-07-21 ENCOUNTER — Encounter: Payer: Self-pay | Admitting: Emergency Medicine

## 2022-07-21 ENCOUNTER — Other Ambulatory Visit: Payer: Self-pay

## 2022-07-21 ENCOUNTER — Emergency Department: Payer: 59

## 2022-07-21 ENCOUNTER — Emergency Department
Admission: EM | Admit: 2022-07-21 | Discharge: 2022-07-21 | Disposition: A | Discharge status: Home / Self Care | Payer: 59 | Attending: Emergency Medicine | Admitting: Emergency Medicine

## 2022-07-21 DIAGNOSIS — Z20822 Contact with and (suspected) exposure to covid-19: Secondary | ICD-10-CM | POA: Diagnosis not present

## 2022-07-21 DIAGNOSIS — R6889 Other general symptoms and signs: Secondary | ICD-10-CM | POA: Diagnosis not present

## 2022-07-21 DIAGNOSIS — R0689 Other abnormalities of breathing: Secondary | ICD-10-CM | POA: Diagnosis not present

## 2022-07-21 DIAGNOSIS — R0602 Shortness of breath: Secondary | ICD-10-CM

## 2022-07-21 DIAGNOSIS — I10 Essential (primary) hypertension: Secondary | ICD-10-CM | POA: Insufficient documentation

## 2022-07-21 DIAGNOSIS — J441 Chronic obstructive pulmonary disease with (acute) exacerbation: Secondary | ICD-10-CM | POA: Insufficient documentation

## 2022-07-21 DIAGNOSIS — R069 Unspecified abnormalities of breathing: Secondary | ICD-10-CM | POA: Diagnosis not present

## 2022-07-21 DIAGNOSIS — Z743 Need for continuous supervision: Secondary | ICD-10-CM | POA: Diagnosis not present

## 2022-07-21 DIAGNOSIS — I499 Cardiac arrhythmia, unspecified: Secondary | ICD-10-CM | POA: Diagnosis not present

## 2022-07-21 DIAGNOSIS — R002 Palpitations: Secondary | ICD-10-CM | POA: Diagnosis present

## 2022-07-21 LAB — CBC WITH DIFFERENTIAL/PLATELET
Abs Immature Granulocytes: 0.03 10*3/uL (ref 0.00–0.07)
Basophils Absolute: 0.1 10*3/uL (ref 0.0–0.1)
Basophils Relative: 1 %
Eosinophils Absolute: 0.8 10*3/uL — ABNORMAL HIGH (ref 0.0–0.5)
Eosinophils Relative: 7 %
HCT: 51.1 % (ref 39.0–52.0)
Hemoglobin: 16.9 g/dL (ref 13.0–17.0)
Immature Granulocytes: 0 %
Lymphocytes Relative: 27 %
Lymphs Abs: 3.1 10*3/uL (ref 0.7–4.0)
MCH: 28.7 pg (ref 26.0–34.0)
MCHC: 33.1 g/dL (ref 30.0–36.0)
MCV: 86.8 fL (ref 80.0–100.0)
Monocytes Absolute: 0.9 10*3/uL (ref 0.1–1.0)
Monocytes Relative: 7 %
Neutro Abs: 6.7 10*3/uL (ref 1.7–7.7)
Neutrophils Relative %: 58 %
Platelets: 163 10*3/uL (ref 150–400)
RBC: 5.89 MIL/uL — ABNORMAL HIGH (ref 4.22–5.81)
RDW: 13.4 % (ref 11.5–15.5)
WBC: 11.5 10*3/uL — ABNORMAL HIGH (ref 4.0–10.5)
nRBC: 0 % (ref 0.0–0.2)

## 2022-07-21 LAB — BASIC METABOLIC PANEL
Anion gap: 8 (ref 5–15)
BUN: 10 mg/dL (ref 8–23)
CO2: 26 mmol/L (ref 22–32)
Calcium: 10.8 mg/dL — ABNORMAL HIGH (ref 8.9–10.3)
Chloride: 103 mmol/L (ref 98–111)
Creatinine, Ser: 0.82 mg/dL (ref 0.61–1.24)
GFR, Estimated: >60 mL/min (ref >60)
Glucose, Bld: 106 mg/dL — ABNORMAL HIGH (ref 70–99)
Potassium: 4 mmol/L (ref 3.5–5.1)
Sodium: 137 mmol/L (ref 135–145)

## 2022-07-21 LAB — TROPONIN I (HIGH SENSITIVITY)
Troponin I (High Sensitivity): 5 ng/L (ref <18)
Troponin I (High Sensitivity): 4 ng/L (ref <18)

## 2022-07-21 LAB — RESP PANEL BY RT-PCR (RSV, FLU A&B, COVID)  RVPGX2
Influenza A by PCR: NEGATIVE
Influenza B by PCR: NEGATIVE
Resp Syncytial Virus by PCR: NEGATIVE
SARS Coronavirus 2 by RT PCR: NEGATIVE

## 2022-07-21 MED ORDER — IPRATROPIUM-ALBUTEROL 0.5-2.5 (3) MG/3ML IN SOLN
3.0000 mL | Freq: Once | RESPIRATORY_TRACT | Status: AC
  Administered 2022-07-21: 3 mL via RESPIRATORY_TRACT
  Filled 2022-07-21: qty 3

## 2022-07-21 MED ORDER — FLUTICASONE FUROATE-VILANTEROL 100-25 MCG/ACT IN AEPB: 1.0000 | INHALATION_SPRAY | Freq: Every day | RESPIRATORY_TRACT | 1 refills | Status: DC

## 2022-07-21 MED ORDER — ALBUTEROL SULFATE (2.5 MG/3ML) 0.083% IN NEBU
2.5000 mg | INHALATION_SOLUTION | Freq: Once | RESPIRATORY_TRACT | Status: AC
  Administered 2022-07-21: 2.5 mg via RESPIRATORY_TRACT
  Filled 2022-07-21: qty 3

## 2022-07-21 MED ORDER — ALBUTEROL SULFATE HFA 108 (90 BASE) MCG/ACT IN AERS: 2.0000 | INHALATION_SPRAY | Freq: Four times a day (QID) | RESPIRATORY_TRACT | 1 refills | Status: DC | PRN

## 2022-07-21 MED ORDER — METHYLPREDNISOLONE SODIUM SUCC 125 MG IJ SOLR
125.0000 mg | Freq: Once | INTRAMUSCULAR | Status: AC
  Administered 2022-07-21: 125 mg via INTRAVENOUS
  Filled 2022-07-21: qty 2

## 2022-07-21 MED ORDER — PREDNISONE 20 MG PO TABS: 60.0000 mg | ORAL_TABLET | Freq: Every day | ORAL | 0 refills | Status: AC

## 2022-07-21 NOTE — ED Provider Notes (Signed)
[facility] Provider Note    Event Date/Time   First MD Initiated Contact with Patient 07/21/22 1553     (approximate)   History   Chief Complaint Shortness of Breath   HPI  David Rhodes is a 67 y.o. male with past medical history of hypertension, COPD, and chronic hypoxic respiratory failure on 4 L nasal cannula who presents to the ED complaining of shortness of breath.  Patient reports that he has been dealing with increasing dry cough and difficulty breathing over the past 3 days.  He has been using his rescue inhaler at home with partial relief.  This has been associated with burning discomfort in his chest when he goes to cough, but he denies any chest pain otherwise.  He has not had any fevers and has not noticed any pain or swelling in his legs.  EMS was called and patient received 2 DuoNebs as well as 1 albuterol, reports this initially seemed to help with his symptoms but breathing has gotten worse again.     Physical Exam   Triage Vital Signs: ED Triage Vitals  Enc Vitals Group     BP 07/21/22 1415 (!) 126/103     Pulse Rate 07/21/22 1415 (!) 117     Resp 07/21/22 1415 16     Temp 07/21/22 1415 98.3 F (36.8 C)     Temp Source 07/21/22 1415 Oral     SpO2 07/21/22 1415 97 %     Weight 07/21/22 1416 150 lb (68 kg)     Height 07/21/22 1416 6' (1.829 m)     Head Circumference --      Peak Flow --      Pain Score 07/21/22 1415 0     Pain Loc --      Pain Edu? --      Excl. in [place]? --     Most recent vital signs: Vitals:   07/21/22 1835 07/21/22 1945  BP: (!) 148/120 139/65  Pulse: (!) 113 95  Resp: 18 20  Temp: 98.1 F (36.7 C) 98.2 F (36.8 C)  SpO2: 98% 96%    Constitutional: Alert and oriented. Eyes: Conjunctivae are normal. Head: Atraumatic. Nose: No congestion/rhinnorhea. Mouth/Throat: Mucous membranes are moist.  Cardiovascular: Tachycardic, regular rhythm. Grossly normal heart sounds.  2+ radial pulses  bilaterally. Respiratory: Normal respiratory effort.  No retractions. Lungs with expiratory wheezing throughout. Gastrointestinal: Soft and nontender. No distention. Musculoskeletal: No lower extremity tenderness nor edema.  Neurologic:  Normal speech and language. No gross focal neurologic deficits are appreciated.    ED Results / Procedures / Treatments   Labs (all labs ordered are listed, but only abnormal results are displayed) Labs Reviewed  BASIC METABOLIC PANEL - Abnormal; Notable for the following components:      Result Value   Glucose, Bld 106 (*)    Calcium 10.8 (*)    All other components within normal limits  CBC WITH DIFFERENTIAL/PLATELET - Abnormal; Notable for the following components:   WBC 11.5 (*)    RBC 5.89 (*)    Eosinophils Absolute 0.8 (*)    All other components within normal limits  RESP PANEL BY RT-PCR (RSV, FLU A&B, COVID)  RVPGX2  TROPONIN I (HIGH SENSITIVITY)  TROPONIN I (HIGH SENSITIVITY)     EKG  ED ECG REPORT I, Blake Divine, the attending physician, personally viewed and interpreted this ECG.   Date: 07/21/2022  EKG Time: 14:28  Rate: 114  Rhythm: sinus tachycardia  Axis: Normal  Intervals:none  ST&T Change: None  RADIOLOGY Chest x-ray reviewed and interpreted by me with no infiltrate, edema, or effusion.  PROCEDURES:  Critical Care performed: No  Procedures   MEDICATIONS ORDERED IN ED: Medications  ipratropium-albuterol (DUONEB) 0.5-2.5 (3) MG/3ML nebulizer solution 3 mL (3 mLs Nebulization Given 07/21/22 1622)  methylPREDNISolone sodium succinate (SOLU-MEDROL) 125 mg/2 mL injection 125 mg (125 mg Intravenous Given 07/21/22 1623)  albuterol (PROVENTIL) (2.5 MG/3ML) 0.083% nebulizer solution 2.5 mg (2.5 mg Nebulization Given 07/21/22 1715)     IMPRESSION / MDM / ASSESSMENT AND PLAN / ED COURSE  I reviewed the triage vital signs and the nursing notes.                              67 y.o. male with past medical history of  hypertension, COPD, and chronic hypoxic respiratory failure on 4 L nasal cannula who presents to the ED with 3 days of dry cough and increasing difficulty breathing.  Patient's presentation is most consistent with acute presentation with potential threat to life or bodily function.  Differential diagnosis includes, but is not limited to, COPD exacerbation, pneumonia, influenza, COVID-19, ACS, PE, CHF.  Patient chronically ill but nontoxic-appearing and in no acute distress, vital signs remarkable for tachycardia but otherwise reassuring.  He is not in any respiratory distress and maintaining oxygen saturations at 99% on his usual 4 L nasal cannula.  He does have significant ongoing wheezing and we will give dose of IV Solu-Medrol along with additional breathing treatment.  Chest x-ray shows no evidence of pneumonia or other acute process, suspect symptoms are due to COPD exacerbation.  EKG shows no evidence of arrhythmia or ischemia and troponin within normal limits, low suspicion for ACS or PE.  Remainder of labs are reassuring with no significant anemia, leukocytosis, electrolyte abnormality, or AKI.  COVID and flu testing is negative, wheezing improving following IV Solu-Medrol and wheezing improving following IV Solu-Medrol and DuoNeb but patient does still have some ongoing wheezing which was treated with albuterol.  Patient was offered admission for ongoing dyspnea, but prefers to discharge home with close follow-up.  He was able to ambulate through the hallway with no drop in oxygen saturations or no increase in his work of breathing, so this seems reasonable.  We will refill his albuterol and discharged home on a course of steroids, patient also requesting refill of his Breo inhaler.  He was counseled to return to the ED for new or worsening symptoms, patient agrees with plan.      FINAL CLINICAL IMPRESSION(S) / ED DIAGNOSES   Final diagnoses:  COPD exacerbation ([place])  SOB (shortness of  breath)     Rx / DC Orders   ED Discharge Orders          Ordered    albuterol (VENTOLIN HFA) 108 (90 Base) MCG/ACT inhaler  Every 6 hours PRN       Note to Pharmacy: Please supply with spacer   07/21/22 2029    fluticasone furoate-vilanterol (BREO ELLIPTA) 100-25 MCG/ACT AEPB  Daily        07/21/22 2029             Note:  This document was prepared using Dragon voice recognition software and may include unintentional dictation errors.   Blake Divine, MD 07/21/22 2032

## 2022-07-21 NOTE — ED Provider Triage Note (Signed)
Emergency Medicine Provider Triage Evaluation Note  [name] , a 67 y.o. male  was evaluated in triage.  Pt complains of SOB that began for 1-2 weeks but worsening over the past couple of days. Been using his inhalers without improvement. History of COPD. Had two duonebs with EMS. On 4L at baseline.  Review of Systems  Positive: sob Negative: Cp, fever  Physical Exam  There were no vitals taken for this visit. Gen:   Awake, no distress   Resp:  Normal effort  MSK:   Moves extremities without difficulty  Other:    Medical Decision Making  Medically screening exam initiated at 2:09 PM.  Appropriate orders placed.  [name] was informed that the remainder of the evaluation will be completed by another provider, this initial triage assessment does not replace that evaluation, and the importance of remaining in the ED until their evaluation is complete.     Marquette Old, PA-C 07/21/22 1412

## 2022-07-21 NOTE — ED Triage Notes (Signed)
Pt to ED via [facility] for shortness of breath for the last few weeks. Pt states that he tried to used his inhalers that he has at home and nebulizer but he has not gotten relief. Pt states that he does have COPD. Pt was given 2 duoneb treatments and 1 albuterol with EMS. Pt is normally on 4 liters of O2 at baseline and was 100% on his 4 liters.

## 2022-07-21 NOTE — ED Triage Notes (Signed)
Pt to ED via [facility] from home. Pt reports SOB x2 days. Pt used inhaler PTA without relief. Pt on 4L [place] chronically. 2 duonebs,1 albuterol, 125mg  solumedrol PTA. Hx COPD and asthma.   CBG 125 HR 110 99% on 4L [place] (chronic) RR 12 BP 164/115

## 2022-07-21 NOTE — ED Notes (Signed)
Pt ambulated length of flex hallway and back to room. O2 sats remaining 97-99% on 4L [place] with no increased work of breathing noted above pt baseline respiratory status

## 2022-07-21 NOTE — ED Notes (Signed)
Pt Dc to home. Dc instructions reviewed with all questions answered. Pt voices understanding. IV removed, cath intact, pressure dressing applied. No bleeding noted at site. Pt assisted out of dept via wheelchair with home/personal 02 setup

## 2022-07-24 ENCOUNTER — Telehealth: Payer: Self-pay

## 2022-07-24 NOTE — Telephone Encounter (Signed)
        Patient  visited [facility] on 07/24/2022  for COPD exacerbation.   Telephone encounter attempt :  1st  Unable to leave message, voicemail full.   [place] [facility] Care Guide   ??[email or web address]  ?? [phone]   Website: [email or web address]  [email or web address]

## 2022-08-13 ENCOUNTER — Telehealth: Payer: Self-pay | Admitting: Internal Medicine

## 2022-08-13 DIAGNOSIS — J439 Emphysema, unspecified: Secondary | ICD-10-CM | POA: Diagnosis not present

## 2022-08-13 DIAGNOSIS — J449 Chronic obstructive pulmonary disease, unspecified: Secondary | ICD-10-CM | POA: Diagnosis not present

## 2022-08-13 DIAGNOSIS — J45909 Unspecified asthma, uncomplicated: Secondary | ICD-10-CM | POA: Diagnosis not present

## 2022-08-13 NOTE — Telephone Encounter (Signed)
Copied from [place] [phone]. Topic: Medicare AWV >> Aug 13, 2022 11:17 AM Devoria Glassing wrote: Reason for CRM: Attempted to schedule AWV. Unable to LVM.  Will try at later time. Mailbox full

## 2022-08-14 ENCOUNTER — Ambulatory Visit
Admission: EM | Admit: 2022-08-14 | Discharge: 2022-08-14 | Disposition: A | Discharge status: Home / Self Care | Payer: 59 | Attending: Emergency Medicine | Admitting: Emergency Medicine

## 2022-08-14 ENCOUNTER — Ambulatory Visit (INDEPENDENT_AMBULATORY_CARE_PROVIDER_SITE_OTHER): Payer: 59

## 2022-08-14 DIAGNOSIS — J441 Chronic obstructive pulmonary disease with (acute) exacerbation: Secondary | ICD-10-CM

## 2022-08-14 DIAGNOSIS — J439 Emphysema, unspecified: Secondary | ICD-10-CM | POA: Diagnosis not present

## 2022-08-14 DIAGNOSIS — R0602 Shortness of breath: Secondary | ICD-10-CM | POA: Diagnosis not present

## 2022-08-14 DIAGNOSIS — S2231XA Fracture of one rib, right side, initial encounter for closed fracture: Secondary | ICD-10-CM | POA: Diagnosis not present

## 2022-08-14 MED ORDER — DEXAMETHASONE SODIUM PHOSPHATE 10 MG/ML IJ SOLN
10.0000 mg | Freq: Once | INTRAMUSCULAR | Status: AC
  Administered 2022-08-14: 10 mg via INTRAMUSCULAR

## 2022-08-14 MED ORDER — ALBUTEROL SULFATE (2.5 MG/3ML) 0.083% IN NEBU
2.5000 mg | INHALATION_SOLUTION | Freq: Once | RESPIRATORY_TRACT | Status: AC
  Administered 2022-08-14: 2.5 mg via RESPIRATORY_TRACT

## 2022-08-14 MED ORDER — PREDNISONE 20 MG PO TABS: 60.0000 mg | ORAL_TABLET | Freq: Every day | ORAL | 0 refills | Status: AC

## 2022-08-14 MED ORDER — LEVOFLOXACIN 500 MG PO TABS: 500.0000 mg | ORAL_TABLET | Freq: Every day | ORAL | 0 refills | Status: DC

## 2022-08-14 NOTE — ED Triage Notes (Signed)
Pt presented to registration stating he could not breathe, pt c/o SOB x couple days, pt reports "it really hit him today" pt reports hx of COPD & asthma

## 2022-08-14 NOTE — ED Provider Notes (Signed)
[facility]    CSN: [id number] Arrival date & time: 08/14/22  1915      History   Chief Complaint Chief Complaint  Patient presents with   Shortness of Breath    HPI David Rhodes is a 67 y.o. male.   HPI  67 year old male here for evaluation of shortness of breath.  The patient reports that he has been experiencing a worsening shortness of breath over the past week that is in conjunction with a nonproductive cough.  He had a sharp increase and shortness of breath just prior to arrival.  He does have a history of COPD.  He denies being a smoker.  He does have increased work of breathing in the exam room.  Past Medical History:  Diagnosis Date   Allergy    Hay fever   Anxiety    Arthritis    Asthma    Chronic pain due to trauma    COPD (chronic obstructive pulmonary disease) (HCC)    Depression    Essential tremor    GERD (gastroesophageal reflux disease)    Headache(784.0)    neck pain   Hypertension    Infection of chest IPG pocket ([place]) 04/06/2018   Inguinal hernia    bilateral   Pneumonia    many years ago   Shortness of breath    when allergies flare up   Urine incontinence    Wears glasses     Patient Active Problem List   Diagnosis Date Noted   Current moderate episode of major depressive disorder without prior episode ([place]) 02/26/2021   Primary osteoarthritis of first carpometacarpal joint of left hand 02/26/2021   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   Dependence on supplemental oxygen 04/18/2020   Leukocytosis 04/15/2019   Inguinal hernia recurrent bilateral 11/16/2018   Chronic left shoulder pain 10/05/2018   PICC (peripherally inserted central catheter) in place 04/16/2018   Essential tremor 10/18/2017   Cervical radiculopathy 07/23/2017   Bilateral temporomandibular joint pain 07/23/2017   Neck pain 05/19/2017   Medication monitoring encounter 09/11/2016   Pulmonary nodules 12/31/2015   COPD (chronic  obstructive pulmonary disease) ([place]) 12/27/2015   Chronic cough 12/27/2015   Asthma 09/24/2015   Allergic rhinitis 11/11/2014   Nerve root pain 11/11/2014   Dyskinesia 11/11/2014   GERD (gastroesophageal reflux disease) 11/11/2014   Calcium blood increased 11/11/2014   Affective disorder ([place]) 11/11/2014   Essential hypertension 12/25/2011   Chronic back pain 12/25/2011    Past Surgical History:  Procedure Laterality Date   DEEP BRAIN STIMULATOR PLACEMENT     INGUINAL HERNIA REPAIR Bilateral 02/07/2014   Procedure:  BILATERAL INGUINAL HERNIA REPAIR;  Surgeon: Ralene Ok, MD;  Location: [place];  Service: General;  Laterality: Bilateral;   INGUINAL HERNIA REPAIR Right 07/05/2021   INSERTION OF MESH Bilateral 02/07/2014   Procedure: INSERTION OF MESH;  Surgeon: Ralene Ok, MD;  Location: [place];  Service: General;  Laterality: Bilateral;   NASAL SINUS SURGERY     PULSE GENERATOR IMPLANT Left 09/11/2016   Procedure: Left chest-Change implantable pulse generator battery;  Surgeon: Erline Levine, MD;  Location: [place];  Service: Neurosurgery;  Laterality: Left;  Left chest-Change implantable pulse generator battery   PULSE GENERATOR IMPLANT Left 04/06/2018   Procedure: Revision of left chest implantable pulse generator, extension, and pocket adapter;  Surgeon: Erline Levine, MD;  Location: [place];  Service: Neurosurgery;  Laterality: Left;   SUBTHALAMIC STIMULATOR BATTERY REPLACEMENT  12/09/2011   Procedure:  SUBTHALAMIC STIMULATOR BATTERY REPLACEMENT;  Surgeon: Erline Levine, MD;  Location: [place] [facility];  Service: Neurosurgery;  Laterality: N/A;   deep brain stimulator, implantable pulse generator change   SUBTHALAMIC STIMULATOR BATTERY REPLACEMENT Left 04/21/2014   Procedure: Deep brain stimulator battery change;  Surgeon: Erline Levine, MD;  Location: [place] [facility];  Service: Neurosurgery;  Laterality: Left;  Deep brain stimulator battery change   SUBTHALAMIC STIMULATOR BATTERY  REPLACEMENT N/A 08/10/2018   Procedure: Replacement of deep brain stimulator pulse generator/left abdomen with long lead extensions;  Surgeon: Erline Levine, MD;  Location: [place];  Service: Neurosurgery;  Laterality: N/A;       Home Medications    Prior to Admission medications   Medication Sig Start Date End Date Taking? Authorizing Provider  albuterol (VENTOLIN HFA) 108 (90 Base) MCG/ACT inhaler Inhale 2 puffs into the lungs every 6 (six) hours as needed for wheezing or shortness of breath. 07/21/22  Yes Blake Divine, MD  fluticasone furoate-vilanterol (BREO ELLIPTA) 100-25 MCG/ACT AEPB Inhale 1 puff into the lungs daily. 07/21/22  Yes Blake Divine, MD  gabapentin (NEURONTIN) 600 MG tablet TAKE 1 TABLET BY MOUTH EVERY MORNING, 1 TABLET AT NOON, 1 TABLET EVERY EVENING AND 1 TABLET EVERY NIGHT AT BEDTIME 04/25/22  Yes Glean Hess, MD  levofloxacin (LEVAQUIN) 500 MG tablet Take 1 tablet (500 mg total) by mouth daily. 08/14/22  Yes Margarette Canada, NP  loratadine (CLARITIN) 10 MG tablet Take 1 tablet (10 mg total) by mouth daily. 10/13/17  Yes Fritzi Mandes, MD  losartan (COZAAR) 50 MG tablet Take 1 tablet (50 mg total) by mouth daily. For high BP 04/25/22  Yes Glean Hess, MD  meloxicam (MOBIC) 15 MG tablet TAKE 1 TABLET(15 MG) BY MOUTH DAILY 04/15/21  Yes Glean Hess, MD  montelukast (SINGULAIR) 10 MG tablet Take 1 tablet (10 mg total) by mouth at bedtime. TAKE 1 TABLET(10 MG) BY MOUTH AT BEDTIME 04/25/22  Yes Glean Hess, MD  omeprazole (PRILOSEC) 20 MG capsule Take 1 capsule (20 mg total) by mouth daily. 04/25/22  Yes Glean Hess, MD  OXYGEN Inhale into the lungs. 3 L of oxygen   Yes [provider]  predniSONE (DELTASONE) 20 MG tablet Take 3 tablets (60 mg total) by mouth daily with breakfast for 5 days. 3 tablets daily for 5 days. 08/14/22 08/19/22 Yes Margarette Canada, NP  rOPINIRole (REQUIP) 2 MG tablet TAKE 1 TABLET BY MOUTH TWICE DAILY 06/09/22  Yes  Glean Hess, MD  sertraline (ZOLOFT) 50 MG tablet Take 1 tablet (50 mg total) by mouth daily. 04/25/22  Yes Glean Hess, MD  baclofen (LIORESAL) 10 MG tablet TAKE 1 TABLET(10 MG) BY MOUTH THREE TIMES DAILY 04/25/22   Glean Hess, MD    Family History Family History  Problem Relation Age of Onset   Myoclonus Mother    Breast cancer Mother    Prostate cancer Father    Heart disease Paternal Grandfather    Myoclonus Maternal Uncle     Social History Social History   Tobacco Use   Smoking status: Never    Passive exposure: Past   Smokeless tobacco: Current    Types: Chew   Tobacco comments:    Trying to taper off.  Too expensive  Vaping Use   Vaping Use: Never used  Substance Use Topics   Alcohol use: No    Alcohol/week: 0.0 standard drinks of alcohol   Drug use: No     Allergies  Penicillins   Review of Systems Review of Systems  Constitutional:  Negative for fever.  Respiratory:  Positive for shortness of breath and wheezing.   Cardiovascular:  Negative for chest pain.     Physical Exam Triage Vital Signs ED Triage Vitals [08/14/22 1915]  Enc Vitals Group     BP (!) 180/116     Pulse Rate (!) 115     Resp      Temp      Temp src      SpO2 99 %     Weight      Height      Head Circumference      Peak Flow      Pain Score      Pain Loc      Pain Edu?      Excl. in [place]?    No data found.  Updated Vital Signs BP (!) 180/116 (BP Location: Right Arm)   Pulse (!) 115   Ht 6' (1.829 m)   SpO2 99%   BMI 20.34 kg/m   Visual Acuity Right Eye Distance:   Left Eye Distance:   Bilateral Distance:    Right Eye Near:   Left Eye Near:    Bilateral Near:     Physical Exam Vitals and nursing note reviewed.  Constitutional:      General: He is in acute distress.     Appearance: Normal appearance. He is ill-appearing.  HENT:     Head: Normocephalic and atraumatic.  Cardiovascular:     Rate and Rhythm: Normal rate and regular  rhythm.     Pulses: Normal pulses.     Heart sounds: Normal heart sounds. No murmur heard.    No friction rub. No gallop.  Pulmonary:     Breath sounds: Wheezing present. No rhonchi or rales.     Comments: Patient has marked increased work of breathing, tachypnea, and diffuse wheezing. Skin:    General: Skin is warm and dry.     Findings: No rash.  Neurological:     Mental Status: He is alert.      UC Treatments / Results  Labs (all labs ordered are listed, but only abnormal results are displayed) Labs Reviewed - No data to display  EKG   Radiology DG Chest 2 View  Result Date: 08/14/2022 CLINICAL DATA:  Shortness of breath. EXAM: CHEST - 2 VIEW COMPARISON:  07/21/2022 FINDINGS: Heart size and pulmonary vascularity are normal. Prominent emphysematous changes in the lungs. No airspace disease or consolidation. No pleural effusions. No pneumothorax. Mediastinal contours appear intact. Old appearing right rib fractures. Degenerative changes in the spine and shoulders. IMPRESSION: Emphysematous changes in the lungs.  No focal consolidation. Electronically Signed   By: Lucienne Capers M.D.   On: 08/14/2022 19:55    Procedures Procedures (including critical care time)  Medications Ordered in UC Medications  dexamethasone (DECADRON) injection 10 mg (has no administration in time range)  albuterol (PROVENTIL) (2.5 MG/3ML) 0.083% nebulizer solution 2.5 mg (2.5 mg Nebulization Given 08/14/22 1919)    Initial Impression / Assessment and Plan / UC Course  I have reviewed the triage vital signs and the nursing notes.  Pertinent labs & imaging results that were available during my care of the patient were reviewed by me and considered in my medical decision making (see chart for details).   Patient presents in respiratory distress with increased work of breathing, tachypnea, tachycardia, and shortness of breath.  He is not able to  speak in full sentences.  He has got diffuse wheezing in  all of his lung fields.  His oxygen saturation is 99% on room air.  I will order a chest x-ray and an albuterol nebulizer treatment to see if this improves his symptoms.  Patient's work of breathing has greatly improved after his nebulizer treatment.  He still has diffuse wheezing and he has rhonchi and all of his lung fields.  Chest x-ray independently reviewed and evaluated by me.  Impression: The patient appears mildly rotated on the film but his lung fields appear clear without any signs of infiltrate or effusion.  Radiology overread is pending. Radiology impression states that there are emphysematous changes in the lungs but no focal consolidation.  I will discharge patient home with a diagnosis of COPD exacerbation and start him on Levaquin 500 mg once daily for 7 days.  I will also start prednisone 60 mg daily for 5 days.  I will order 10 mg of Decadron to be given in clinic to bridge him till the morning.  He should continue his albuterol inhalers every 4-6 hours as needed for shortness of breath.   Final Clinical Impressions(s) / UC Diagnoses   Final diagnoses:  COPD exacerbation [facility])     Discharge Instructions      Your chest x-ray did not show any pneumonia just shows emphysema.  Your symptoms are being caused by an exacerbation of your COPD.  Take the Levaquin 500 mg once daily for 7 days for treatment of your COPD exacerbation.  Starting tomorrow morning take 60 mg of prednisone at breakfast time.  You will take this dose each morning for 5 mornings.  This help decrease lung inflammation and [place] improving your breathing.  Continue your albuterol and Breo Ellipta inhalers as previously prescribed.  If you develop any worsening shortness of breath despite using the inhalers and steroids I recommend that you go to the ER for evaluation.     ED Prescriptions     Medication Sig Dispense Auth. Provider   predniSONE (DELTASONE) 20 MG tablet Take 3 tablets (60 mg total) by  mouth daily with breakfast for 5 days. 3 tablets daily for 5 days. 15 tablet Margarette Canada, NP   levofloxacin (LEVAQUIN) 500 MG tablet Take 1 tablet (500 mg total) by mouth daily. 7 tablet Margarette Canada, NP      PDMP not reviewed this encounter.   Margarette Canada, NP 08/14/22 2001

## 2022-08-14 NOTE — Discharge Instructions (Addendum)
Your chest x-ray did not show any pneumonia just shows emphysema.  Your symptoms are being caused by an exacerbation of your COPD.  Take the Levaquin 500 mg once daily for 7 days for treatment of your COPD exacerbation.  Starting tomorrow morning take 60 mg of prednisone at breakfast time.  You will take this dose each morning for 5 mornings.  This help decrease lung inflammation and [place] improving your breathing.  Continue your albuterol and Breo Ellipta inhalers as previously prescribed.  If you develop any worsening shortness of breath despite using the inhalers and steroids I recommend that you go to the ER for evaluation.

## 2022-09-11 DIAGNOSIS — J45909 Unspecified asthma, uncomplicated: Secondary | ICD-10-CM | POA: Diagnosis not present

## 2022-09-11 DIAGNOSIS — J449 Chronic obstructive pulmonary disease, unspecified: Secondary | ICD-10-CM | POA: Diagnosis not present

## 2022-09-11 DIAGNOSIS — J439 Emphysema, unspecified: Secondary | ICD-10-CM | POA: Diagnosis not present

## 2022-09-18 ENCOUNTER — Telehealth: Payer: Self-pay | Admitting: Internal Medicine

## 2022-09-18 NOTE — Telephone Encounter (Signed)
Copied from [place] [phone]. Topic: Medicare AWV >> Sep 18, 2022 11:47 AM Devoria Glassing wrote: Reason for CRM: Called patient to schedule Medicare Annual Wellness Visit (AWV). No voicemail available to leave a message.  Last date of AWV: 03/18/2021   Please schedule an appointment at any time with Kirke Shaggy, LPN on/after [id number] on AWV schedule. .  If any questions, please contact me.  Thank you ,  [place] Dade; [place] Direct Dial: [phone]

## 2022-10-12 ENCOUNTER — Encounter: Payer: Self-pay | Admitting: Emergency Medicine

## 2022-10-12 ENCOUNTER — Ambulatory Visit
Admission: EM | Admit: 2022-10-12 | Discharge: 2022-10-12 | Disposition: A | Discharge status: Home / Self Care | Payer: 59 | Attending: Physician Assistant | Admitting: Physician Assistant

## 2022-10-12 DIAGNOSIS — R0600 Dyspnea, unspecified: Secondary | ICD-10-CM | POA: Diagnosis not present

## 2022-10-12 DIAGNOSIS — J441 Chronic obstructive pulmonary disease with (acute) exacerbation: Secondary | ICD-10-CM | POA: Diagnosis not present

## 2022-10-12 DIAGNOSIS — R051 Acute cough: Secondary | ICD-10-CM | POA: Diagnosis not present

## 2022-10-12 DIAGNOSIS — J45909 Unspecified asthma, uncomplicated: Secondary | ICD-10-CM | POA: Diagnosis not present

## 2022-10-12 DIAGNOSIS — J449 Chronic obstructive pulmonary disease, unspecified: Secondary | ICD-10-CM | POA: Diagnosis not present

## 2022-10-12 DIAGNOSIS — J439 Emphysema, unspecified: Secondary | ICD-10-CM | POA: Diagnosis not present

## 2022-10-12 MED ORDER — DOXYCYCLINE HYCLATE 100 MG PO CAPS: 100.0000 mg | ORAL_CAPSULE | Freq: Two times a day (BID) | ORAL | 0 refills | Status: AC

## 2022-10-12 MED ORDER — DEXAMETHASONE SODIUM PHOSPHATE 10 MG/ML IJ SOLN
10.0000 mg | Freq: Once | INTRAMUSCULAR | Status: AC
  Administered 2022-10-12: 10 mg via INTRAMUSCULAR

## 2022-10-12 MED ORDER — PREDNISONE 10 MG PO TABS: ORAL_TABLET | ORAL | 0 refills | Status: DC

## 2022-10-12 NOTE — ED Provider Notes (Signed)
[facility]    CSN: [id number] Arrival date & time: 10/12/22  1452      History   Chief Complaint Chief Complaint  Patient presents with   Cough   Shortness of Breath    HPI David Rhodes is a 67 y.o. male with history of COPD, allergies, and asthma.  Patient presents today for more shortness of breath than usual x 2 days.  Patient is having difficulty speaking in full sentences due to feeling short of breath and wheezing.  Patient normally is on 3 L of oxygen at home.  He is still on 3 L of oxygen.  He says his at home breathing treatments have not helped and he believes he may need some corticosteroids at this point.  Reports cough is currently dry.  He denies any associated fevers or pain in his chest. Occasional pain in abdomen when coughing. No sick contacts.  No COVID or flu exposure.  Patient has been using his inhalers as prescribed but says when he has shortness of breath like this it is difficulty even performing nebulizer treatment himself.  Patient reports that he has been to the emergency department for COPD exacerbations in the past. Has had 2 other exacerbations this year. Has not seen pulmonologist in "awhile."  He has no history of DVT or PE.  He says his current symptoms are consistent with a COPD exacerbation.  Other medical history significant for anxiety, chronic pain, depression, essential tremors, hypertension which is mostly situational.  No other concerns today.  HPI  Past Medical History:  Diagnosis Date   Allergy    Hay fever   Anxiety    Arthritis    Asthma    Chronic pain due to trauma    COPD (chronic obstructive pulmonary disease)    Depression    Essential tremor    GERD (gastroesophageal reflux disease)    Headache(784.0)    neck pain   Hypertension    Infection of chest IPG pocket (HCC) 04/06/2018   Inguinal hernia    bilateral   Pneumonia    many years ago   Shortness of breath    when allergies flare up   Urine incontinence     Wears glasses     Patient Active Problem List   Diagnosis Date Noted   Current moderate episode of major depressive disorder without prior episode 02/26/2021   Primary osteoarthritis of first carpometacarpal joint of left hand 02/26/2021   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   Dependence on supplemental oxygen 04/18/2020   Leukocytosis 04/15/2019   Inguinal hernia recurrent bilateral 11/16/2018   Chronic left shoulder pain 10/05/2018   PICC (peripherally inserted central catheter) in place 04/16/2018   Essential tremor 10/18/2017   Cervical radiculopathy 07/23/2017   Bilateral temporomandibular joint pain 07/23/2017   Neck pain 05/19/2017   Medication monitoring encounter 09/11/2016   Pulmonary nodules 12/31/2015   COPD (chronic obstructive pulmonary disease) 12/27/2015   Chronic cough 12/27/2015   Asthma 09/24/2015   Allergic rhinitis 11/11/2014   Nerve root pain 11/11/2014   Dyskinesia 11/11/2014   GERD (gastroesophageal reflux disease) 11/11/2014   Calcium blood increased 11/11/2014   Affective disorder 11/11/2014   Essential hypertension 12/25/2011   Chronic back pain 12/25/2011    Past Surgical History:  Procedure Laterality Date   DEEP BRAIN STIMULATOR PLACEMENT     INGUINAL HERNIA REPAIR Bilateral 02/07/2014   Procedure:  BILATERAL INGUINAL HERNIA REPAIR;  Surgeon: Axel Filler, MD;  Location: [facility]  OR;  Service: General;  Laterality: Bilateral;   INGUINAL HERNIA REPAIR Right 07/05/2021   INSERTION OF MESH Bilateral 02/07/2014   Procedure: INSERTION OF MESH;  Surgeon: Axel Filler, MD;  Location: [facility] OR;  Service: General;  Laterality: Bilateral;   NASAL SINUS SURGERY     PULSE GENERATOR IMPLANT Left 09/11/2016   Procedure: Left chest-Change implantable pulse generator battery;  Surgeon: Maeola Harman, MD;  Location: [facility] OR;  Service: Neurosurgery;  Laterality: Left;  Left chest-Change implantable pulse generator battery   PULSE GENERATOR IMPLANT  Left 04/06/2018   Procedure: Revision of left chest implantable pulse generator, extension, and pocket adapter;  Surgeon: Maeola Harman, MD;  Location[facility] OR;  Service: Neurosurgery;  Laterality: Left;   SUBTHALAMIC STIMULATOR BATTERY REPLACEMENT  12/09/2011   Procedure: SUBTHALAMIC STIMULATOR BATTERY REPLACEMENT;  Surgeon: Maeola Harman, MD;  Location: [facility] OR[facility];  Service: Neurosurgery;  Laterality: N/A;   deep brain stimulator, implantable pulse generator change   SUBTHALAMIC STIMULATOR BATTERY REPLACEMENT Left 04/21/2014   Procedure: Deep brain stimulator battery change;  Surgeon: Maeola Harman, MD;  Location: [facility] OR[facility];  Service: Neurosurgery;  Laterality: Left;  Deep brain stimulator battery change   SUBTHALAMIC STIMULATOR BATTERY REPLACEMENT N/A 08/10/2018   Procedure: Replacement of deep brain stimulator pulse generator/left abdomen with long lead extensions;  Surgeon: Maeola Harman, MD;  Location: [facility] OR;  Service: Neurosurgery;  Laterality: N/A;       Home Medications    Prior to Admission medications   Medication Sig Start Date End Date Taking? Authorizing Provider  doxycycline (VIBRAMYCIN) 100 MG capsule Take 1 capsule (100 mg total) by mouth 2 (two) times daily for 7 days. 10/12/22 10/19/22 Yes Shirlee Latch, PA-C  predniSONE (DELTASONE) 10 MG tablet Take 5 tabs x 2 days, 4 tabs x 2 days, 3 tabs x 2 days, 2 tabs x2 days, 1 tab x 2 days. 10/12/22  Yes Shirlee Latch, PA-C  albuterol (VENTOLIN HFA) 108 (90 Base) MCG/ACT inhaler Inhale 2 puffs into the lungs every 6 (six) hours as needed for wheezing or shortness of breath. 07/21/22   Chesley Noon, MD  baclofen (LIORESAL) 10 MG tablet TAKE 1 TABLET(10 MG) BY MOUTH THREE TIMES DAILY 04/25/22   Reubin Milan, MD  fluticasone furoate-vilanterol (BREO ELLIPTA) 100-25 MCG/ACT AEPB Inhale 1 puff into the lungs daily. 07/21/22   Chesley Noon, MD  gabapentin (NEURONTIN) 600 MG tablet TAKE 1 TABLET BY MOUTH EVERY MORNING, 1 TABLET AT  NOON, 1 TABLET EVERY EVENING AND 1 TABLET EVERY NIGHT AT BEDTIME 04/25/22   Reubin Milan, MD  loratadine (CLARITIN) 10 MG tablet Take 1 tablet (10 mg total) by mouth daily. 10/13/17   Enedina Finner, MD  losartan (COZAAR) 50 MG tablet Take 1 tablet (50 mg total) by mouth daily. For high BP 04/25/22   Reubin Milan, MD  meloxicam (MOBIC) 15 MG tablet TAKE 1 TABLET(15 MG) BY MOUTH DAILY 04/15/21   Reubin Milan, MD  montelukast (SINGULAIR) 10 MG tablet Take 1 tablet (10 mg total) by mouth at bedtime. TAKE 1 TABLET(10 MG) BY MOUTH AT BEDTIME 04/25/22   Reubin Milan, MD  omeprazole (PRILOSEC) 20 MG capsule Take 1 capsule (20 mg total) by mouth daily. 04/25/22   Reubin Milan, MD  OXYGEN Inhale into the lungs. 3 L of oxygen    [provider]  rOPINIRole (REQUIP) 2 MG tablet TAKE 1 TABLET BY MOUTH TWICE DAILY 06/09/22   Reubin Milan, MD  sertraline (ZOLOFT) 50 MG tablet Take 1 tablet (50 mg total) by mouth daily. 04/25/22   Reubin Milan, MD    Family History Family History  Problem Relation Age of Onset   Myoclonus Mother    Breast cancer Mother    Prostate cancer Father    Heart disease Paternal Grandfather    Myoclonus Maternal Uncle     Social History Social History   Tobacco Use   Smoking status: Never    Passive exposure: Past   Smokeless tobacco: Current    Types: Chew   Tobacco comments:    Trying to taper off.  Too expensive  Vaping Use   Vaping Use: Never used  Substance Use Topics   Alcohol use: No    Alcohol/week: 0.0 standard drinks of alcohol   Drug use: No     Allergies   Penicillins   Review of Systems Review of Systems  Constitutional:  Positive for fatigue. Negative for fever.  HENT:  Positive for congestion. Negative for rhinorrhea, sinus pressure, sinus pain and sore throat.   Respiratory:  Positive for cough and wheezing. Negative for shortness of breath.   Cardiovascular:  Negative for chest pain.   Gastrointestinal:  Negative for abdominal pain, diarrhea, nausea and vomiting.  Musculoskeletal:  Negative for myalgias.  Neurological:  Negative for weakness, light-headedness and headaches.  Hematological:  Negative for adenopathy.     Physical Exam Triage Vital Signs ED Triage Vitals  Enc Vitals Group     BP 05/11/21 1159 (!) 179/121     Pulse Rate 05/11/21 1159 100     Resp 05/11/21 1159 (!) 36     Temp 05/11/21 1159 97.6 F (36.4 C)     Temp Source 05/11/21 1159 Oral     SpO2 05/11/21 1159 97 %     Weight 05/11/21 1200 145 lb 15.1 oz (66.2 kg)     Height 05/11/21 1200 6' (1.829 m)     Head Circumference --      Peak Flow --      Pain Score 05/11/21 1200 0     Pain Loc --      Pain Edu? --      Excl. in GC? --    No data found.  Updated Vital Signs BP (!) 138/93 (BP Location: Right Arm)   Pulse (!) 111   Temp 97.7 F (36.5 C) (Oral)   Resp 16   Ht 6' (1.829 m)   Wt 149 lb 14.6 oz (68 kg)   SpO2 100%   BMI 20.33 kg/m    Physical Exam Vitals and nursing note reviewed.  Constitutional:      General: He is in acute distress.     Appearance: Normal appearance. He is well-developed. He is ill-appearing.     Comments: No distress when placed on 3 L O2  HENT:     Head: Normocephalic and atraumatic.     Nose: Nose normal.     Mouth/Throat:     Mouth: Mucous membranes are moist.     Pharynx: Oropharynx is clear.  Eyes:     General: No scleral icterus.    Conjunctiva/sclera: Conjunctivae normal.  Cardiovascular:     Rate and Rhythm: Normal rate and regular rhythm.     Heart sounds: Normal heart sounds.  Pulmonary:     Effort: Tachypnea and respiratory distress (moderate distress, speaks 2-3 words and then pauses to catch breath. Prefers sitting in tripod position) present.     Breath  sounds: Wheezing (few wheezes throughout, but overall diminished breath sounds) present.  Musculoskeletal:     Cervical back: Neck supple.  Skin:    General: Skin is warm and  dry.  Neurological:     General: No focal deficit present.     Mental Status: He is alert. Mental status is at baseline.     Motor: No weakness.     Coordination: Coordination normal.     Gait: Gait normal.  Psychiatric:        Mood and Affect: Mood normal.        Behavior: Behavior normal.        Thought Content: Thought content normal.      UC Treatments / Results  Labs (all labs ordered are listed, but only abnormal results are displayed) Labs Reviewed - No data to display   EKG   Radiology No results found.  Procedures Procedures (including critical care time)  Medications Ordered in UC Medications  dexamethasone (DECADRON) injection 10 mg (10 mg Intramuscular Given 10/12/22 1547)     Initial Impression / Assessment and Plan / UC Course  I have reviewed the triage vital signs and the nursing notes.  Pertinent labs & imaging results that were available during my care of the patient were reviewed by me and considered in my medical decision making (see chart for details).  67 year old male with history of COPD and asthma presenting for shortness of breath that has worsened over the past few days and especially today.  Patient comes in in acute respiratory distress, unable to speak more than 2-3 words without pausing to take of breath, tripoding.  Use of accessory muscles.  Oxygen is 100% on 3 L of oxygen.  Patient brings his supplemental oxygen in.  He is afebrile.  BP is elevated at 138/93 and elevated pulse of 111 bpm.  He does have few scattered wheezes throughout but overall diminished breath sounds.  Patient placed on our room oxygen at 3L.   Patient was given 10 mg IM dexamethasone in clinic for exacerbation.  Clinical presentation is consistent with COPD exacerbation.  Will treat for COPD exacerbation at this time with doxycycline and prednisone.  Also encouraged him to perform his nebulizer treatments at home.  Advised increasing rest and fluids.  Close monitoring  of his condition and if his shortness of breath worsens he knows to call 911 or have someone take him to the emergency department.  Advised following up with pulmonologist.   Final Clinical Impressions(s) / UC Diagnoses   Final diagnoses:  COPD exacerbation  Acute cough  Dyspnea, unspecified type     Discharge Instructions      -We have given you a steroid shot. Start prednisone tomorrow.  -Continue inhalers and home oxygen -Increase rest and fluids.  -I also sent antibiotics -Take Mucinex. -Follow up with pulmonology -Go to ER if breathing worsens or fever.      ED Prescriptions     Medication Sig Dispense Auth. Provider   doxycycline (VIBRAMYCIN) 100 MG capsule Take 1 capsule (100 mg total) by mouth 2 (two) times daily for 7 days. 14 capsule Eusebio Friendly B, PA-C   predniSONE (DELTASONE) 10 MG tablet Take 5 tabs x 2 days, 4 tabs x 2 days, 3 tabs x 2 days, 2 tabs x2 days, 1 tab x 2 days. 30 tablet Gareth Morgan      PDMP not reviewed this encounter.      Shirlee Latch, PA-C 10/12/22 1601

## 2022-10-12 NOTE — ED Triage Notes (Signed)
Patient c/o cough, chest congestion, and SOB that started 2 days ago.  Patient denies any fever or chills.  Patient does reports upper abdominal pain also.  Patient reports being constipated and last BM was on Friday and was small and hard stools.  Patient has history of COPD.

## 2022-10-12 NOTE — Discharge Instructions (Signed)
-  We have given you a steroid shot. Start prednisone tomorrow.  -Continue inhalers and home oxygen -Increase rest and fluids.  -I also sent antibiotics -Take Mucinex. -Follow up with pulmonology -Go to ER if breathing worsens or fever.

## 2022-10-28 ENCOUNTER — Ambulatory Visit: Payer: Medicare Other | Admitting: Internal Medicine

## 2022-12-09 ENCOUNTER — Telehealth: Payer: Self-pay

## 2022-12-09 NOTE — Telephone Encounter (Signed)
Called pt to schedule AWV. Phone went to VM. Could not leave VM.  KP

## 2023-01-06 DIAGNOSIS — M182 Bilateral post-traumatic osteoarthritis of first carpometacarpal joints: Secondary | ICD-10-CM | POA: Diagnosis not present

## 2023-01-29 ENCOUNTER — Ambulatory Visit
Admission: EM | Admit: 2023-01-29 | Discharge: 2023-01-29 | Disposition: A | Discharge status: Home / Self Care | Payer: Medicare HMO | Source: Home / Self Care | Attending: Family Medicine | Admitting: Family Medicine

## 2023-01-29 DIAGNOSIS — J441 Chronic obstructive pulmonary disease with (acute) exacerbation: Secondary | ICD-10-CM

## 2023-01-29 MED ORDER — AZITHROMYCIN 250 MG PO TABS: ORAL_TABLET | ORAL | 0 refills | Status: DC

## 2023-01-29 MED ORDER — IPRATROPIUM-ALBUTEROL 0.5-2.5 (3) MG/3ML IN SOLN
3.0000 mL | RESPIRATORY_TRACT | Status: AC | PRN
  Administered 2023-01-29 (×2): 3 mL via RESPIRATORY_TRACT

## 2023-01-29 MED ORDER — PREDNISONE 50 MG PO TABS: 50.0000 mg | ORAL_TABLET | Freq: Every day | ORAL | 0 refills | Status: AC

## 2023-01-29 MED ORDER — DEXAMETHASONE SODIUM PHOSPHATE 10 MG/ML IJ SOLN
10.0000 mg | Freq: Once | INTRAMUSCULAR | Status: AC
  Administered 2023-01-29: 10 mg via INTRAMUSCULAR

## 2023-01-29 NOTE — ED Triage Notes (Signed)
Pt c/o continued SOB x2-3days  Pt states that he has COPD and is trying to get a new tower for home oxygen  Pt brought his inhaler but states he does not know how to use it.   Pt used 1 puff of his inhaler in the room after instruction and states that it will take a while for him to see any results.

## 2023-01-29 NOTE — ED Provider Notes (Signed)
[facility]    CSN: [id number] Arrival date & time: 01/29/23  1840      History   Chief Complaint Chief Complaint  Patient presents with   Shortness of Breath    HPI David Rhodes is a 67 y.o. male.   HPI  History obtained from the patient. David Rhodes presents for SOB that started 2 weeks ago and is progressively getting worse the past 3 days. He has COPD.    Has not been using his inhalers as he is unsure how to use them.  Has slight cough but not much.  No fever.  Uses oxygen at home. He has to come up with money to get a new oxygen tower.  He uses oxygen as needed throughtout the day.     He fell a week ago and hit his head. EMS came but he refused to go to the hospital for evaluation.        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   Inguinal hernia    bilateral   Pneumonia    many years ago   Shortness of breath    when allergies flare up   Urine incontinence    Wears glasses     Patient Active Problem List   Diagnosis Date Noted   Current moderate episode of major depressive disorder without prior episode (HCC) 02/26/2021   Primary osteoarthritis of first carpometacarpal joint of left hand 02/26/2021   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   Dependence on supplemental oxygen 04/18/2020   Leukocytosis 04/15/2019   Inguinal hernia recurrent bilateral 11/16/2018   Chronic left shoulder pain 10/05/2018   PICC (peripherally inserted central catheter) in place 04/16/2018   Essential tremor 10/18/2017   Cervical radiculopathy 07/23/2017   Bilateral temporomandibular joint pain 07/23/2017   Neck pain 05/19/2017    Medication monitoring encounter 09/11/2016   Pulmonary nodules 12/31/2015   COPD (chronic obstructive pulmonary disease) (HCC) 12/27/2015   Chronic cough 12/27/2015   Asthma 09/24/2015   Allergic rhinitis 11/11/2014   Nerve root pain 11/11/2014   Dyskinesia 11/11/2014   GERD (gastroesophageal reflux disease) 11/11/2014   Calcium blood increased 11/11/2014   Affective disorder (HCC) 11/11/2014   Essential hypertension 12/25/2011   Chronic back pain 12/25/2011    Past Surgical History:  Procedure Laterality Date   DEEP BRAIN STIMULATOR PLACEMENT     INGUINAL HERNIA REPAIR Bilateral 02/07/2014   Procedure:  BILATERAL INGUINAL HERNIA REPAIR;  Surgeon: Axel Filler, MD;  Location: [facility] OR;  Service: General;  Laterality: Bilateral;   INGUINAL HERNIA REPAIR Right 07/05/2021   INSERTION OF MESH Bilateral 02/07/2014   Procedure: INSERTION OF MESH;  Surgeon: Axel Filler, MD;  Location: [facility] OR;  Service: General;  Laterality: Bilateral;   NASAL SINUS SURGERY     PULSE GENERATOR IMPLANT Left 09/11/2016   Procedure: Left chest-Change implantable pulse generator battery;  Surgeon: Maeola Harman, MD;  Location: [facility] OR;  Service: Neurosurgery;  Laterality: Left;  Left chest-Change implantable pulse generator battery   PULSE GENERATOR IMPLANT Left 04/06/2018   Procedure: Revision  of left chest implantable pulse generator, extension, and pocket adapter;  Surgeon: Maeola Harman, MD;  Location: [facility] OR;  Service: Neurosurgery;  Laterality: Left;   SUBTHALAMIC STIMULATOR BATTERY REPLACEMENT  12/09/2011   Procedure: SUBTHALAMIC STIMULATOR BATTERY REPLACEMENT;  Surgeon: Maeola Harman, MD;  Location: [facility];  Service: Neurosurgery;  Laterality: N/A;   deep brain stimulator, implantable pulse generator change   SUBTHALAMIC STIMULATOR BATTERY REPLACEMENT Left 04/21/2014   Procedure: Deep brain stimulator battery change;  Surgeon: Maeola Harman, MD;  Location: [facility];  Service: Neurosurgery;   Laterality: Left;  Deep brain stimulator battery change   SUBTHALAMIC STIMULATOR BATTERY REPLACEMENT N/A 08/10/2018   Procedure: Replacement of deep brain stimulator pulse generator/left abdomen with long lead extensions;  Surgeon: Maeola Harman, MD;  Location: [facility] OR;  Service: Neurosurgery;  Laterality: N/A;       Home Medications    Prior to Admission medications   Medication Sig Start Date End Date Taking? Authorizing Provider  albuterol (VENTOLIN HFA) 108 (90 Base) MCG/ACT inhaler Inhale 2 puffs into the lungs every 6 (six) hours as needed for wheezing or shortness of breath. 07/21/22  Yes Chesley Noon, MD  azithromycin (ZITHROMAX Z-PAK) 250 MG tablet Take 2 tablets on day 1 then 1 tablet daily 01/29/23  Yes , , DO  baclofen (LIORESAL) 10 MG tablet TAKE 1 TABLET(10 MG) BY MOUTH THREE TIMES DAILY 04/25/22  Yes Reubin Milan, MD  fluticasone furoate-vilanterol (BREO ELLIPTA) 100-25 MCG/ACT AEPB Inhale 1 puff into the lungs daily. 07/21/22  Yes Chesley Noon, MD  gabapentin (NEURONTIN) 600 MG tablet TAKE 1 TABLET BY MOUTH EVERY MORNING, 1 TABLET AT NOON, 1 TABLET EVERY EVENING AND 1 TABLET EVERY NIGHT AT BEDTIME 04/25/22  Yes Reubin Milan, MD  loratadine (CLARITIN) 10 MG tablet Take 1 tablet (10 mg total) by mouth daily. 10/13/17  Yes Enedina Finner, MD  losartan (COZAAR) 50 MG tablet Take 1 tablet (50 mg total) by mouth daily. For high BP 04/25/22  Yes Reubin Milan, MD  meloxicam (MOBIC) 15 MG tablet TAKE 1 TABLET(15 MG) BY MOUTH DAILY 04/15/21  Yes Reubin Milan, MD  montelukast (SINGULAIR) 10 MG tablet Take 1 tablet (10 mg total) by mouth at bedtime. TAKE 1 TABLET(10 MG) BY MOUTH AT BEDTIME 04/25/22  Yes Reubin Milan, MD  omeprazole (PRILOSEC) 20 MG capsule Take 1 capsule (20 mg total) by mouth daily. 04/25/22  Yes Reubin Milan, MD  OXYGEN Inhale into the lungs. 3 L of oxygen   Yes [provider]  predniSONE (DELTASONE) 50 MG tablet Take 1  tablet (50 mg total) by mouth daily for 5 days. 01/29/23 02/03/23 Yes , , DO  rOPINIRole (REQUIP) 2 MG tablet TAKE 1 TABLET BY MOUTH TWICE DAILY 06/09/22  Yes Reubin Milan, MD  sertraline (ZOLOFT) 50 MG tablet Take 1 tablet (50 mg total) by mouth daily. 04/25/22  Yes Reubin Milan, MD    Family History Family History  Problem Relation Age of Onset   Myoclonus Mother    Breast cancer Mother    Prostate cancer Father    Heart disease Paternal Grandfather    Myoclonus Maternal Uncle     Social History Social History   Tobacco Use   Smoking status: Never    Passive exposure: Past   Smokeless tobacco: Current    Types: Chew   Tobacco comments:    Trying to taper off.  Too expensive  Vaping Use   Vaping status:  Never Used  Substance Use Topics   Alcohol use: No    Alcohol/week: 0.0 standard drinks of alcohol   Drug use: No     Allergies   Penicillins   Review of Systems Review of Systems: negative unless otherwise stated in HPI.      Physical Exam Triage Vital Signs ED Triage Vitals  Encounter Vitals Group     BP 01/29/23 1901 (!) 133/93     Systolic BP Percentile --      Diastolic BP Percentile --      Pulse Rate 01/29/23 1851 92     Resp --      Temp 01/29/23 1901 98.4 F (36.9 C)     Temp Source 01/29/23 1901 Oral     SpO2 01/29/23 1851 97 %     Weight 01/29/23 1853 165 lb (74.8 kg)     Height 01/29/23 1853 6' (1.829 m)     Head Circumference --      Peak Flow --      Pain Score --      Pain Loc --      Pain Education --      Exclude from Growth Chart --    No data found.  Updated Vital Signs BP (!) 133/93 (BP Location: Left Arm)   Pulse 86   Temp 98.4 F (36.9 C) (Oral)   Resp 20   Ht 6' (1.829 m)   Wt 74.8 kg   SpO2 99%   BMI 22.38 kg/m   Visual Acuity Right Eye Distance:   Left Eye Distance:   Bilateral Distance:    Right Eye Near:   Left Eye Near:    Bilateral Near:     Physical Exam GEN:     alert, non-toxic  appearing male in no distress    HENT:  mucus membranes moist, no nasal discharge EYES:   pupils equal and reactive, no scleral injection or discharge RESP:  no increased work of breathing, rhonchi diffusely, pausing between breathes, poor air movement CVS:   regular rate and rhythm Skin:   warm and dry    UC Treatments / Results  Labs (all labs ordered are listed, but only abnormal results are displayed) Labs Reviewed - No data to display  EKG   Radiology No results found.  Procedures Procedures (including critical care time)  Medications Ordered in UC Medications  ipratropium-albuterol (DUONEB) 0.5-2.5 (3) MG/3ML nebulizer solution 3 mL (3 mLs Nebulization Given 01/29/23 1948)  dexamethasone (DECADRON) injection 10 mg (10 mg Intramuscular Given 01/29/23 1923)    Initial Impression / Assessment and Plan / UC Course  I have reviewed the triage vital signs and the nursing notes.  Pertinent labs & imaging results that were available during my care of the patient were reviewed by me and considered in my medical decision making (see chart for details).       Pt is a 67 y.o. male who presents for 2 weeks of increasing shortness of breath that is worsening.  [place] is afebrile here. Satting well on room air. Overall pt is  non-toxic appearing, well hydrated, without respiratory distress. Initial pulmonary exam is remarkable for rhonchi that clear with cough.  Recommended chest xray but he declined. COVID  and influenza testing deferred due to length of symptoms.   Treat acute COPD exacerbation with steroids and antibiotics as below. Reviewed Typical duration of symptoms discussed. Return and ED precautions given and patient voiced understanding.   Discussed MDM, treatment plan and  plan for follow-up with patient who agrees with plan.      Final Clinical Impressions(s) / UC Diagnoses   Final diagnoses:  COPD exacerbation [facility])     Discharge Instructions      Stop by the  pharmacy to pick up your prescriptions.  Be sure to use your inhalers regularly to prevent flare ups of your COPD.  Use your home oxygen as needed.  If your breathing suddenly worsens, call EMS and go to the emergency department.    Go to ED for red flag symptoms, including; fevers you cannot reduce with Tylenol/Motrin, severe headaches, vision changes, numbness/weakness in part of the body, lethargy, confusion, intractable vomiting, severe dehydration, chest pain, breathing difficulty, severe persistent abdominal or pelvic pain, signs of severe infection (increased redness, swelling of an area), feeling faint or passing out, dizziness, etc. You should especially go to the ED for sudden acute worsening of condition if you do not elect to go at this time.       ED Prescriptions     Medication Sig Dispense Auth. Provider   predniSONE (DELTASONE) 50 MG tablet Take 1 tablet (50 mg total) by mouth daily for 5 days. 5 tablet , , DO   azithromycin (ZITHROMAX Z-PAK) 250 MG tablet Take 2 tablets on day 1 then 1 tablet daily 6 tablet , , DO      PDMP not reviewed this encounter.   Katha Cabal, DO 01/31/23 1444

## 2023-01-29 NOTE — Discharge Instructions (Addendum)
Stop by the pharmacy to pick up your prescriptions.  Be sure to use your inhalers regularly to prevent flare ups of your COPD.  Use your home oxygen as needed.  If your breathing suddenly worsens, call EMS and go to the emergency department.    Go to ED for red flag symptoms, including; fevers you cannot reduce with Tylenol/Motrin, severe headaches, vision changes, numbness/weakness in part of the body, lethargy, confusion, intractable vomiting, severe dehydration, chest pain, breathing difficulty, severe persistent abdominal or pelvic pain, signs of severe infection (increased redness, swelling of an area), feeling faint or passing out, dizziness, etc. You should especially go to the ED for sudden acute worsening of condition if you do not elect to go at this time.

## 2023-01-29 NOTE — ED Triage Notes (Addendum)
Pt states that he fell 1 week ago and feels he "busted his tailbone" and had a laceration along the edge of his head.  Pt called the ambulance and declined going to the hospital to avoid stitches.  Pt has a scabbed over wound along the right side of the head and small scabbed over areas along the top of the head.

## 2023-02-11 ENCOUNTER — Ambulatory Visit: Payer: Medicare HMO

## 2023-02-11 VITALS — Ht 73.0 in | Wt 165.0 lb

## 2023-02-11 NOTE — Progress Notes (Deleted)
Subjective:   David Rhodes is a 67 y.o. male who presents for Medicare Annual/Subsequent preventive examination.  Visit Complete: Virtual  I connected with  David Rhodes on 02/11/23 by a audio enabled telemedicine application and verified that I am speaking with the correct person using two identifiers.  Patient Location: Home  {Provider [phone]::"Home Office"}  I discussed the limitations of evaluation and management by telemedicine. The patient expressed understanding and agreed to proceed.  Patient Medicare AWV questionnaire was completed by the patient on ***; I have confirmed that all information answered by patient is correct and no changes since this date.  Review of Systems    ***       Objective:    There were no vitals filed for this visit. There is no height or weight on file to calculate BMI.     01/29/2023    6:56 PM 10/12/2022    3:23 PM 07/21/2022    2:17 PM 06/09/2022    6:26 AM 11/28/2021    1:01 PM 10/03/2021   12:42 PM 07/17/2021    2:05 PM  Advanced Directives  Does Patient Have a Medical Advance Directive? No No No No No No No  Would patient like information on creating a medical advance directive?     No - Patient declined No - Patient declined No - Patient declined    Current Medications (verified) Outpatient Encounter Medications as of 02/11/2023  Medication Sig   albuterol (VENTOLIN HFA) 108 (90 Base) MCG/ACT inhaler Inhale 2 puffs into the lungs every 6 (six) hours as needed for wheezing or shortness of breath.   azithromycin (ZITHROMAX Z-PAK) 250 MG tablet Take 2 tablets on day 1 then 1 tablet daily   baclofen (LIORESAL) 10 MG tablet TAKE 1 TABLET(10 MG) BY MOUTH THREE TIMES DAILY   fluticasone furoate-vilanterol (BREO ELLIPTA) 100-25 MCG/ACT AEPB Inhale 1 puff into the lungs daily.   gabapentin (NEURONTIN) 600 MG tablet TAKE 1 TABLET BY MOUTH EVERY MORNING, 1 TABLET AT NOON, 1 TABLET EVERY EVENING AND 1 TABLET EVERY NIGHT AT BEDTIME    loratadine (CLARITIN) 10 MG tablet Take 1 tablet (10 mg total) by mouth daily.   losartan (COZAAR) 50 MG tablet Take 1 tablet (50 mg total) by mouth daily. For high BP   meloxicam (MOBIC) 15 MG tablet TAKE 1 TABLET(15 MG) BY MOUTH DAILY   montelukast (SINGULAIR) 10 MG tablet Take 1 tablet (10 mg total) by mouth at bedtime. TAKE 1 TABLET(10 MG) BY MOUTH AT BEDTIME   omeprazole (PRILOSEC) 20 MG capsule Take 1 capsule (20 mg total) by mouth daily.   OXYGEN Inhale into the lungs. 3 L of oxygen   rOPINIRole (REQUIP) 2 MG tablet TAKE 1 TABLET BY MOUTH TWICE DAILY   sertraline (ZOLOFT) 50 MG tablet Take 1 tablet (50 mg total) by mouth daily.   No facility-administered encounter medications on file as of 02/11/2023.    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   Inguinal hernia    bilateral   Pneumonia  many years ago   Shortness of breath    when allergies flare up   Urine incontinence    Wears glasses    Past Surgical History:  Procedure Laterality Date   DEEP BRAIN STIMULATOR PLACEMENT     INGUINAL HERNIA REPAIR Bilateral 02/07/2014   Procedure:  BILATERAL INGUINAL HERNIA REPAIR;  Surgeon: Axel Filler, MD;  Location: [facility] OR;  Service: General;  Laterality: Bilateral;   INGUINAL HERNIA REPAIR Right 07/05/2021   INSERTION OF MESH Bilateral 02/07/2014   Procedure: INSERTION OF MESH;  Surgeon: Axel Filler, MD;  Location: [facility] OR;  Service: General;  Laterality: Bilateral;   NASAL SINUS SURGERY     PULSE GENERATOR IMPLANT Left 09/11/2016   Procedure: Left chest-Change implantable pulse generator battery;  Surgeon: Maeola Harman, MD;  Location: [facility] OR;  Service: Neurosurgery;  Laterality: Left;  Left chest-Change implantable pulse generator battery   PULSE GENERATOR IMPLANT Left 04/06/2018   Procedure: Revision of left chest implantable pulse generator, extension, and pocket adapter;  Surgeon: Maeola Harman, MD;  Location: [facility] OR;  Service: Neurosurgery;  Laterality: Left;   SUBTHALAMIC STIMULATOR BATTERY REPLACEMENT  12/09/2011   Procedure: SUBTHALAMIC STIMULATOR BATTERY REPLACEMENT;  Surgeon: Maeola Harman, MD;  Location: [facility] NEUR[facility] ORS;  Service: Neurosurgery;  Laterality: N/A;   deep brain stimulator, implantable pulse generator change   SUBTHALAMIC STIMULATOR BATTERY REPLACEMENT Left 04/21/2014   Procedure: Deep brain stimulator battery change;  Surgeon: Maeola Harman, MD;  Location: [facility];  Service: Neurosurgery;  Laterality: Left;  Deep brain stimulator battery change   SUBTHALAMIC STIMULATOR BATTERY REPLACEMENT N/A 08/10/2018   Procedure: Replacement of deep brain stimulator pulse generator/left abdomen with long lead extensions;  Surgeon: Maeola Harman, MD;  [facility];  Service: Neurosurgery;  Laterality: N/A;   Family History  Problem Relation Age of Onset   Myoclonus Mother    Breast cancer Mother    Prostate cancer Father    Heart disease Paternal Grandfather    Myoclonus Maternal Uncle    Social History   Socioeconomic History   Marital status: Single    Spouse name: 0   Number of children: Not on file   Years of education: Not on file   Highest education level: Associate degree: occupational, Scientist, product/process development, or vocational program  Occupational History   Not on file  Tobacco Use   Smoking status: Never    Passive exposure: Past   Smokeless tobacco: Current    Types: Chew   Tobacco comments:    Trying to taper off.  Too expensive  Vaping Use   Vaping status: Never Used  Substance and Sexual Activity   Alcohol use: No    Alcohol/week: 0.0 standard drinks of alcohol   Drug use: No    Sexual activity: Not on file  Other Topics Concern   Not on file  Social History Narrative   Pt lives alone   disabled   Social Determinants of Health   Financial Resource Strain: Low Risk  (04/25/2022)   Overall Financial Resource Strain (CARDIA)    Difficulty of Paying Living Expenses: Not hard at all  Food Insecurity: No Food Insecurity (06/25/2022)   Hunger Vital Sign    Worried About Running Out of Food in the Last Year: Never true    Ran Out of Food in the Last Year: Never true  Transportation Needs: No Transportation Needs (06/25/2022)   PRAPARE - Administrator, Civil Service (Medical): No    Lack of Transportation (  Non-Medical): No  Physical Activity: Inactive (04/30/2022)   Exercise Vital Sign    Days of Exercise per Week: 0 days    Minutes of Exercise per Session: 0 min  Stress: No Stress Concern Present (04/30/2022)   David Rhodes [employer] - Occupational Stress Questionnaire    Feeling of Stress : Only a little  Social Connections: Socially Isolated (04/30/2022)   Social Connection and Isolation Panel [NHANES]    Frequency of Communication with Friends and Family: Three times a week    Frequency of Social Gatherings with Friends and Family: Twice a week    Attends Religious Services: Never    Database administrator or Organizations: No    Attends Engineer, structural: Never    Marital Status: Never married    Tobacco Counseling Ready to quit: Not Answered Counseling given: Not Answered Tobacco comments: Trying to taper off.  Too expensive   Clinical Intake:                        Activities of Daily Living    06/09/2022    6:28 AM 06/09/2022    6:24 AM  In your present state of health, do you have any difficulty performing the following activities:  Hearing?  0  Vision?  0  Difficulty concentrating or making decisions?  0  Walking or climbing stairs?  0  Dressing or bathing?  0  Doing errands,  shopping? 0     Patient Care Team: Reubin Milan, MD as PCP - General (Internal Medicine) Mertie Moores, MD as Referring Physician (Pulmonary Disease) Maeola Harman, MD as Consulting Physician (Neurosurgery) Rosey Bath, MD (Inactive) as Referring Physician (Hematology and Oncology) Janora Norlander, MD as Referring Physician (Pulmonary Disease)  Indicate any recent Medical Services you may have received from other than [facility] providers in the past year (date may be approximate).     Assessment:   This is a routine wellness examination for David Rhodes.  Hearing/Vision screen No results found.  Dietary issues and exercise activities discussed:     Goals Addressed   None    Depression Screen    04/30/2022   10:21 AM 04/25/2022    8:42 AM 10/21/2021    8:28 AM 04/22/2021    9:23 AM 03/18/2021    8:31 AM 02/26/2021   10:18 AM 01/09/2021   10:15 AM  PHQ 2/9 Scores  PHQ - 2 Score 4 3 2 6 6 4 3   PHQ- 9 Score 15 13 8 18 15 15 9     Fall Risk    04/25/2022    8:45 AM 10/21/2021    8:29 AM 07/17/2021    2:05 PM 04/22/2021    9:23 AM 03/18/2021    8:35 AM  Fall Risk   Falls in the past year? 0 0 0 0 0  Number falls in past yr: 0 0  0 0  Injury with Fall? 0 0  0 0  Risk for fall due to : No Fall Risks No Fall Risks Medication side effect No Fall Risks Other (Comment)  Risk for fall due to: Comment     oxygen tank  Follow up Falls evaluation completed Falls evaluation completed Education provided;Falls prevention discussed Falls evaluation completed Falls prevention discussed    MEDICARE RISK AT HOME:   TIMED UP AND GO:  Was the test performed?  {[phone]}    Cognitive Function:  Immunizations Immunization History  Administered Date(s) Administered   PNEUMOCOCCAL CONJUGATE-20 03/18/2021   Pneumococcal Polysaccharide-23 04/14/2019   Tdap 03/31/2019    {TDAP status:2101805}  {Flu Vaccine status:2101806}  {Pneumococcal vaccine  status:2101807}  {Covid-19 vaccine status:2101808}  Qualifies for Shingles Vaccine? {YES/NO:21197}  Zostavax completed {YES/NO:21197}  {Shingrix Completed?:2101804}  Screening Tests Health Maintenance  Topic Date Due   COVID-19 Vaccine (1) Never done   Zoster Vaccines- Shingrix (1 of 2) Never done   Medicare Annual Wellness (AWV)  03/18/2022   INFLUENZA VACCINE  01/29/2023   Colonoscopy  04/19/2023 (Originally 09/22/2000)   DTaP/Tdap/Td (2 - Td or Tdap) 03/30/2029   Pneumonia Vaccine 51+ Years old  Completed   Hepatitis C Screening  Completed   HPV VACCINES  Aged Out    Health Maintenance  Health Maintenance Due  Topic Date Due   COVID-19 Vaccine (1) Never done   Zoster Vaccines- Shingrix (1 of 2) Never done   Medicare Annual Wellness (AWV)  03/18/2022   INFLUENZA VACCINE  01/29/2023    {Colorectal cancer screening:2101809}  Lung Cancer Screening: (Low Dose CT Chest recommended if Age 44-80 years, 20 pack-year currently smoking OR have quit w/in 15years.) {DOES NOT does:27190::"does not"} qualify.   Lung Cancer Screening Referral: ***  Additional Screening:  Hepatitis C Screening: {DOES NOT does:27190::"does not"} qualify; Completed ***  Vision Screening: Recommended annual ophthalmology exams for early detection of glaucoma and other disorders of the eye. Is the patient up to date with their annual eye exam?  {YES/NO:21197} Who is the provider or what is the name of the office in which the patient attends annual eye exams? *** If pt is not established with a provider, would they like to be referred to a provider to establish care? {YES/NO:21197}.   Dental Screening: Recommended annual dental exams for proper oral hygiene  Diabetic Foot Exam: {Diabetic Foot Exam:2101802}  Community Resource Referral / Chronic Care Management: CRR required this visit?  {YES/NO:21197}  CCM required this visit?  {CCM Required choices:[phone]}     Plan:     I have personally  reviewed and noted the following in the patient's chart:   Medical and social history Use of alcohol, tobacco or illicit drugs  Current medications and supplements including opioid prescriptions. {Opioid Prescriptions:[phone]} Functional ability and status Nutritional status Physical activity Advanced directives List of other physicians Hospitalizations, surgeries, and ER visits in previous 12 months Vitals Screenings to include cognitive, depression, and falls Referrals and appointments  In addition, I have reviewed and discussed with patient certain preventive protocols, quality metrics, and best practice recommendations. A written personalized care plan for preventive services as well as general preventive health recommendations were provided to patient.     Everitt Amber   02/11/2023   After Visit Summary: {CHL AMB AWV After Visit [phone]}  Nurse Notes: ***

## 2023-02-12 NOTE — Progress Notes (Signed)
Pt did not answer phone for his visit

## 2023-04-24 DIAGNOSIS — Z4889 Encounter for other specified surgical aftercare: Secondary | ICD-10-CM | POA: Diagnosis not present

## 2023-04-28 ENCOUNTER — Emergency Department
Admission: EM | Admit: 2023-04-28 | Discharge: 2023-04-28 | Disposition: A | Discharge status: Home / Self Care | Payer: Medicare HMO | Attending: Emergency Medicine | Admitting: Emergency Medicine

## 2023-04-28 ENCOUNTER — Other Ambulatory Visit: Payer: Self-pay

## 2023-04-28 DIAGNOSIS — K4090 Unilateral inguinal hernia, without obstruction or gangrene, not specified as recurrent: Secondary | ICD-10-CM | POA: Insufficient documentation

## 2023-04-28 NOTE — ED Provider Notes (Signed)
[facility] Provider Note    Event Date/Time   First MD Initiated Contact with Patient 04/28/23 0404     (approximate)   History   Hernia   HPI David Rhodes is a 67 y.o. male who presents for evaluation of his right sided inguinal hernia.  He said he has had it for years and has actually had 3 surgeries between his left and his right inguinal hernia.  The right-sided hernia hurts him from time to time, particularly if he was walking around a lot.  He said he can feel it come in and out very easily.  It was hurting him earlier but now it is not.  He has had no nausea or vomiting.  No recent fever.  No recent trauma.  He does not remember when he last had a surgery but it has been many years.     Physical Exam   Triage Vital Signs: ED Triage Vitals  Encounter Vitals Group     BP 04/28/23 0307 (!) 158/93     Systolic BP Percentile --      Diastolic BP Percentile --      Pulse Rate 04/28/23 0307 70     Resp 04/28/23 0307 18     Temp 04/28/23 0307 98 F (36.7 C)     Temp Source 04/28/23 0307 Oral     SpO2 04/28/23 0307 100 %     Weight 04/28/23 0306 72.6 kg (160 lb)     Height 04/28/23 0306 1.829 m (6')     Head Circumference --      Peak Flow --      Pain Score 04/28/23 0306 0     Pain Loc --      Pain Education --      Exclude from Growth Chart --     Most recent vital signs: Vitals:   04/28/23 0307  BP: (!) 158/93  Pulse: 70  Resp: 18  Temp: 98 F (36.7 C)  SpO2: 100%    General: Awake, no distress.  Very disheveled and dirty but otherwise looks and acts appropriate. CV:  Good peripheral perfusion.  Regular rate and rhythm. Resp:  Normal effort. Speaking easily and comfortably, no accessory muscle usage nor intercostal retractions.   Abd:  No distention.  No tenderness to palpation of the abdomen. Other:  Patient has an minimally visible but easily palpable right inguinal hernia.  It is easily reducible and nontender.  No  ecchymosis.   ED Results / Procedures / Treatments   Labs (all labs ordered are listed, but only abnormal results are displayed) Labs Reviewed - No data to display    PROCEDURES:  Critical Care performed: No  Procedures    IMPRESSION / MDM / ASSESSMENT AND PLAN / ED COURSE  I reviewed the triage vital signs and the nursing notes.                              Differential diagnosis includes, but is not limited to, hernia, strangulation, incarceration.  Patient's presentation is most consistent with exacerbation of chronic illness.   Well-appearing, ambulatory without difficulty, no current pain.  Easily reducible right inguinal hernia, nontender.  Hernia obviously slips in and out quite easily with the change of the patient's position.  He is well acquainted with the issue and knows how to apply gentle pressure.  I had my usual and customary inguinal hernia discussion with  the patient and recommended outpatient follow-up, but there is no indication for emergent intervention or imaging tonight.  He seems comfortable with this plan.  I gave my usual follow-up recommendations and return precautions.         FINAL CLINICAL IMPRESSION(S) / ED DIAGNOSES   Final diagnoses:  Right inguinal hernia     Rx / DC Orders   ED Discharge Orders     None        Note:  This document was prepared using Dragon voice recognition software and may include unintentional dictation errors.   Loleta Rose, MD 04/28/23 [phone]

## 2023-04-28 NOTE — Discharge Instructions (Signed)
As we discussed, we believe you are suffering from an inguinal hernia.  Please read through the included discharge instructions.  Although you do not need urgent or emergent surgery, it is very important that you follow-up with a surgeon as indicated in these documents to discuss definitive treatment of this condition with a surgical procedure.  Please apply pressure to the affected area with your hand when you are getting ready to cough, sneeze, get out of bed, or otherwise strain.  This may help prevent the hernia from "popping out".  He should also look at your local pharmacy to see if they have an "inguinal truss" or other holster-like device that applies constant pressure to the area.  Take regular over-the-counter pain medicine as indicated on the labels and any prescriptions provided today.  Follow-up as indicated in these documents.  Return to the emergency department if he develop new or worsening pain or if you are concerned that the hernia has come out and is stuck, resulting in nausea and vomiting, inability to have bowel movement or passed gas, fever or chills, or other symptoms that concern you.

## 2023-04-28 NOTE — ED Triage Notes (Signed)
Pt to ED via pov c/o hernia. Pt says he is had right groin pain yesterday afternoon. Not having pain at this time. Pt has hx of hernia on that side.

## 2023-07-05 ENCOUNTER — Other Ambulatory Visit: Payer: Self-pay | Admitting: Internal Medicine

## 2023-07-07 ENCOUNTER — Other Ambulatory Visit: Payer: Self-pay | Admitting: Internal Medicine

## 2023-07-07 NOTE — Telephone Encounter (Signed)
 Called pt - recording stated "the number you are calling cannot be dialed". Tried 2 times.

## 2023-07-07 NOTE — Telephone Encounter (Signed)
 Courtesy refill. Patient will need an office visit for further refills. Requested Prescriptions  Pending Prescriptions Disp Refills   rOPINIRole  (REQUIP ) 2 MG tablet [Pharmacy Med Name: ROPINIROLE  2MG  TABLETS] 30 tablet 0    Sig: TAKE 1 TABLET BY MOUTH TWICE DAILY     Neurology:  Parkinsonian Agents Failed - 07/07/2023  3:35 PM      Failed - Last BP in normal range    BP Readings from Last 1 Encounters:  04/28/23 (!) 149/87         Failed - Valid encounter within last 12 months    Recent Outpatient Visits           1 year ago Annual physical exam   [place] [facility], Leita DEL, MD   1 year ago Severe persistent asthma, unspecified whether complicated   [place] [facility] at [facility], Leita DEL, MD   2 years ago Annual physical exam   [facility] at [facility], Leita DEL, MD   2 years ago Current moderate episode of major depressive disorder without prior episode [facility])    [facility] & Sports Medicine at [facility], Leita DEL, MD   2 years ago Reducible right inguinal hernia   [facility] at [facility], Leita DEL, MD              Passed - Last Heart Rate in normal range    Pulse Readings from Last 1 Encounters:  04/28/23 73

## 2023-07-10 ENCOUNTER — Encounter: Payer: Self-pay | Admitting: *Deleted

## 2023-07-10 NOTE — Telephone Encounter (Signed)
Patient needs appt scheduled

## 2023-07-10 NOTE — Telephone Encounter (Signed)
 Needs appt for additional refills

## 2023-07-10 NOTE — Telephone Encounter (Signed)
 Rx 07/07/23 #60- needs appointment notes, Attempted to contact patient - number unable to connect- message sent per MyChart Requested Prescriptions  Pending Prescriptions Disp Refills   rOPINIRole  (REQUIP ) 2 MG tablet [Pharmacy Med Name: ROPINIROLE  2MG  TABLETS] 180 tablet     Sig: TAKE 1 TABLET BY MOUTH TWICE DAILY     Neurology:  Parkinsonian Agents Failed - 07/10/2023 12:37 PM      Failed - Last BP in normal range    BP Readings from Last 1 Encounters:  04/28/23 (!) 149/87         Failed - Valid encounter within last 12 months    Recent Outpatient Visits           1 year ago Annual physical exam   [place] [facility] at [facility], Leita DEL, MD   1 year ago Severe persistent asthma, unspecified whether complicated   [place] [facility] at [facility], Leita DEL, MD   2 years ago Annual physical exam   [facility] at [facility], Leita DEL, MD   2 years ago Current moderate episode of major depressive disorder without prior episode [facility])    [facility] & [facility] at [facility] Leita DEL, MD   2 years ago Reducible right inguinal hernia   [facility] at [facility], Leita DEL, MD              Passed - Last Heart Rate in normal range    Pulse Readings from Last 1 Encounters:  04/28/23 73

## 2023-08-07 ENCOUNTER — Ambulatory Visit: Payer: Self-pay | Admitting: Internal Medicine

## 2023-08-12 ENCOUNTER — Emergency Department
Admission: EM | Admit: 2023-08-12 | Discharge: 2023-08-12 | Disposition: A | Discharge status: Home / Self Care | Payer: 59 | Attending: Emergency Medicine | Admitting: Emergency Medicine

## 2023-08-12 ENCOUNTER — Other Ambulatory Visit: Payer: Self-pay

## 2023-08-12 ENCOUNTER — Emergency Department: Payer: 59

## 2023-08-12 ENCOUNTER — Telehealth: Payer: Self-pay

## 2023-08-12 DIAGNOSIS — Z20822 Contact with and (suspected) exposure to covid-19: Secondary | ICD-10-CM | POA: Insufficient documentation

## 2023-08-12 DIAGNOSIS — J441 Chronic obstructive pulmonary disease with (acute) exacerbation: Secondary | ICD-10-CM | POA: Insufficient documentation

## 2023-08-12 DIAGNOSIS — R0602 Shortness of breath: Secondary | ICD-10-CM | POA: Diagnosis present

## 2023-08-12 DIAGNOSIS — I10 Essential (primary) hypertension: Secondary | ICD-10-CM | POA: Insufficient documentation

## 2023-08-12 LAB — RESP PANEL BY RT-PCR (RSV, FLU A&B, COVID)  RVPGX2
Influenza A by PCR: NEGATIVE
Influenza B by PCR: NEGATIVE
Resp Syncytial Virus by PCR: NEGATIVE
SARS Coronavirus 2 by RT PCR: NEGATIVE

## 2023-08-12 LAB — BASIC METABOLIC PANEL
Anion gap: 11 (ref 5–15)
BUN: 16 mg/dL (ref 8–23)
CO2: 22 mmol/L (ref 22–32)
Calcium: 10.2 mg/dL (ref 8.9–10.3)
Chloride: 109 mmol/L (ref 98–111)
Creatinine, Ser: 0.84 mg/dL (ref 0.61–1.24)
GFR, Estimated: >60 mL/min (ref >60)
Glucose, Bld: 110 mg/dL — ABNORMAL HIGH (ref 70–99)
Potassium: 3.7 mmol/L (ref 3.5–5.1)
Sodium: 142 mmol/L (ref 135–145)

## 2023-08-12 LAB — CBC WITH DIFFERENTIAL/PLATELET
Abs Immature Granulocytes: 0.04 10*3/uL (ref 0.00–0.07)
Basophils Absolute: 0.1 10*3/uL (ref 0.0–0.1)
Basophils Relative: 1 %
Eosinophils Absolute: 0.9 10*3/uL — ABNORMAL HIGH (ref 0.0–0.5)
Eosinophils Relative: 8 %
HCT: 43.1 % (ref 39.0–52.0)
Hemoglobin: 14.4 g/dL (ref 13.0–17.0)
Immature Granulocytes: 0 %
Lymphocytes Relative: 15 %
Lymphs Abs: 1.5 10*3/uL (ref 0.7–4.0)
MCH: 29.3 pg (ref 26.0–34.0)
MCHC: 33.4 g/dL (ref 30.0–36.0)
MCV: 87.8 fL (ref 80.0–100.0)
Monocytes Absolute: 0.5 10*3/uL (ref 0.1–1.0)
Monocytes Relative: 4 %
Neutro Abs: 7.3 10*3/uL (ref 1.7–7.7)
Neutrophils Relative %: 72 %
Platelets: 177 10*3/uL (ref 150–400)
RBC: 4.91 MIL/uL (ref 4.22–5.81)
RDW: 13.2 % (ref 11.5–15.5)
WBC: 10.2 10*3/uL (ref 4.0–10.5)
nRBC: 0 % (ref 0.0–0.2)

## 2023-08-12 MED ORDER — LEVOFLOXACIN 250 MG PO TABS: 500.0000 mg | ORAL_TABLET | Freq: Every day | ORAL | 0 refills | Status: AC

## 2023-08-12 MED ORDER — IPRATROPIUM-ALBUTEROL 0.5-2.5 (3) MG/3ML IN SOLN
3.0000 mL | Freq: Once | RESPIRATORY_TRACT | Status: AC
  Administered 2023-08-12: 3 mL via RESPIRATORY_TRACT
  Filled 2023-08-12: qty 3

## 2023-08-12 MED ORDER — IPRATROPIUM-ALBUTEROL 0.5-2.5 (3) MG/3ML IN SOLN: 3.0000 mL | Freq: Once | RESPIRATORY_TRACT | Status: DC

## 2023-08-12 MED ORDER — SODIUM CHLORIDE 0.9 % IV BOLUS
1000.0000 mL | Freq: Once | INTRAVENOUS | Status: AC
  Administered 2023-08-12: 1000 mL via INTRAVENOUS

## 2023-08-12 MED ORDER — PREDNISONE 10 MG PO TABS: 40.0000 mg | ORAL_TABLET | Freq: Every day | ORAL | 0 refills | Status: AC

## 2023-08-12 NOTE — ED Provider Notes (Signed)
[facility] Provider Note    Event Date/Time   First MD Initiated Contact with Patient 08/12/23 0120     (approximate)   History   Shortness of Breath   HPI David Rhodes is a 68 y.o. male with history of HTN, COPD intermittently on oxygen as needed presenting today for shortness of breath.  Patient reports over the past couple of days he has had slight worsening shortness of breath.  He felt like he was having a COPD exacerbation and called EMS.  They noted wheezing on arrival and reportedly gave him a breathing treatment.  He states he did feel better after this.  No reported hypoxia.  States he has had some congestion recently.  Otherwise denies fever, productive cough, chest pain, abdominal pain, nausea, vomiting, leg pain, leg swelling.  Patient reports being back to his baseline at this time.     Physical Exam   Triage Vital Signs: ED Triage Vitals  Encounter Vitals Group     BP 08/12/23 0122 (!) 150/118     Systolic BP Percentile --      Diastolic BP Percentile --      Pulse Rate 08/12/23 0122 (!) 106     Resp 08/12/23 0122 (!) 22     Temp 08/12/23 0126 98.4 F (36.9 C)     Temp Source 08/12/23 0126 Oral     SpO2 08/12/23 0122 98 %     Weight 08/12/23 0125 149 lb 14.4 oz (68 kg)     Height 08/12/23 0125 6' (1.829 m)     Head Circumference --      Peak Flow --      Pain Score 08/12/23 0123 0     Pain Loc --      Pain Education --      Exclude from Growth Chart --     Most recent vital signs: Vitals:   08/12/23 0122 08/12/23 0126  BP: (!) 150/118   Pulse: (!) 106   Resp: (!) 22   Temp:  98.4 F (36.9 C)  SpO2: 98%    Physical Exam: I have reviewed the vital signs and nursing notes. General: Awake, alert, no acute distress.  Nontoxic appearing. Head:  Atraumatic, normocephalic.   ENT:  EOM intact, PERRL. Oral mucosa is pink and moist with no lesions. Neck: Neck is supple with full range of motion, No meningeal  signs. Cardiovascular:  RRR, No murmurs. Peripheral pulses palpable and equal bilaterally. Respiratory:  Symmetrical chest wall expansion.  No rhonchi, rales. Mild expiratory wheeze present. Good air movement throughout.  No use of accessory muscles.   Musculoskeletal:  No cyanosis or edema. Moving extremities with full ROM Abdomen:  Soft, nontender, nondistended. Neuro:  GCS 15, moving all four extremities, interacting appropriately. Speech clear. Psych:  Calm, appropriate.   Skin:  Warm, dry, no rash.    ED Results / Procedures / Treatments   Labs (all labs ordered are listed, but only abnormal results are displayed) Labs Reviewed  CBC WITH DIFFERENTIAL/PLATELET - Abnormal; Notable for the following components:      Result Value   Eosinophils Absolute 0.9 (*)    All other components within normal limits  BASIC METABOLIC PANEL - Abnormal; Notable for the following components:   Glucose, Bld 110 (*)    All other components within normal limits  RESP PANEL BY RT-PCR (RSV, FLU A&B, COVID)  [id number]     EKG My EKG interpretation: Rate of 94, normal sinus  rhythm, normal axis, normal intervals.  No acute ST elevations or depressions   RADIOLOGY Independently interpreted chest x-ray with no acute pathology   PROCEDURES:  Critical Care performed: No  Procedures   MEDICATIONS ORDERED IN ED: Medications  sodium chloride 0.9 % bolus 1,000 mL (1,000 mLs Intravenous New Bag/Given 08/12/23 0205)  ipratropium-albuterol (DUONEB) 0.5-2.5 (3) MG/3ML nebulizer solution 3 mL (3 mLs Nebulization Given 08/12/23 0205)     IMPRESSION / MDM / ASSESSMENT AND PLAN / ED COURSE  I reviewed the triage vital signs and the nursing notes.                              Differential diagnosis includes, but is not limited to, COPD exacerbation, pneumonia, COVID, flu, RSV, pneumothorax  Patient's presentation is most consistent with acute complicated illness / injury requiring diagnostic  workup.  Patient is a 68 year old male presenting today for shortness of breath with wheezing with prior history of COPD.  On arrival he appears very close to his baseline.  Will get chest x-ray, viral swabs, and laboratory workup.  Patient be given 1 DuoNeb as well as 1 L of fluids for his slight wheezing and dehydration on exam.  Laboratory workup shows unremarkable CBC, BMP.  Negative for COVID, flu, RSV.  Chest x-ray negative.  Patient was reassessed and completely titrated off oxygen although he does use it as needed at home.  Lung sounds clear and no ongoing tachypnea.  Patient will be discharged for COPD exacerbation with levofloxacin and prednisone.  Told to follow-up with PCP and given strict return precautions.  The patient is on the cardiac monitor to evaluate for evidence of arrhythmia and/or significant heart rate changes.     FINAL CLINICAL IMPRESSION(S) / ED DIAGNOSES   Final diagnoses:  COPD exacerbation (HCC)     Rx / DC Orders   ED Discharge Orders          Ordered    predniSONE (DELTASONE) 10 MG tablet  Daily        08/12/23 0254    levofloxacin (LEVAQUIN) 250 MG tablet  Daily        08/12/23 0254             Note:  This document was prepared using Dragon voice recognition software and may include unintentional dictation errors.   Janith Lima, MD 08/12/23 [phone]

## 2023-08-12 NOTE — Discharge Instructions (Signed)
Please pick up the medications I have sent to your pharmacy and take as prescribed.  Please follow-up with your primary care provider within the next week for reassessment

## 2023-08-12 NOTE — Telephone Encounter (Signed)
Pt went to the ED today 08/11/33. Pt is on the schedule fr tomorrow to be seen. Pt was prescribed prednisone with a pick up date of 07/13/23. Told pt the pick up date. Told pt he needs to completed the 4 days of prednisone before we see him. Pt said "well never mind then" and abruptly hung up the phone.  KP

## 2023-08-13 ENCOUNTER — Ambulatory Visit: Payer: 59 | Admitting: Internal Medicine

## 2023-08-18 ENCOUNTER — Other Ambulatory Visit: Payer: Self-pay | Admitting: Internal Medicine

## 2023-08-18 DIAGNOSIS — J439 Emphysema, unspecified: Secondary | ICD-10-CM

## 2023-08-18 DIAGNOSIS — F321 Major depressive disorder, single episode, moderate: Secondary | ICD-10-CM

## 2023-08-18 DIAGNOSIS — I10 Essential (primary) hypertension: Secondary | ICD-10-CM

## 2023-08-19 NOTE — Telephone Encounter (Signed)
Requested medication (s) are due for refill today - yes  Requested medication (s) are on the active medication list -yes  Future visit scheduled -no  Last refill: sertraline 04/25/22 #90 1RF- fails lab protocol/visit protocol                  Ropinirole 07/07/23 #60- courtesy RF- fails visit protocol                  Montelukast 04/25/22 #90 3RF- fails visit protocol                  Losartan 04/25/22 #90 1RF- fails visit protocol Notes to clinic: Attempted to call patient- busy signal- unable to leave call back message- no showed last appointment- sent for reveiw  Requested Prescriptions  Pending Prescriptions Disp Refills   sertraline (ZOLOFT) 50 MG tablet [Pharmacy Med Name: SERTRALINE 50MG  TABLETS] 90 tablet 1    Sig: TAKE 1 TABLET(50 MG) BY MOUTH DAILY     Psychiatry:  Antidepressants - SSRI - sertraline Failed - 08/19/2023 12:22 PM      Failed - AST in normal range and within 360 days    AST  Date Value Ref Range Status  04/25/2022 23 0 - 40 IU/L Final   SGOT(AST)  Date Value Ref Range Status  07/20/2011 18 15 - 37 Unit/L Final         Failed - ALT in normal range and within 360 days    ALT  Date Value Ref Range Status  04/25/2022 19 0 - 44 IU/L Final   SGPT (ALT)  Date Value Ref Range Status  07/20/2011 22 U/L Final    Comment:    12-78 NOTE: NEW REFERENCE RANGE 05/23/2011          Failed - Completed PHQ-2 or PHQ-9 in the last 360 days      Failed - Valid encounter within last 6 months    Recent Outpatient Visits           1 year ago Annual physical exam   [place] [facility] at [facility], Nyoka Cowden, MD   1 year ago Severe persistent asthma, unspecified whether complicated   [place] [facility] at [facility], Nyoka Cowden, MD   2 years ago Annual physical exam   [facility] at [facility], Nyoka Cowden, MD   2 years ago Current moderate episode of  major depressive disorder without prior episode [facility])   [place] [facility] at [facility], Nyoka Cowden, MD   2 years ago Reducible right inguinal hernia   [place] [facility] at [facility], Nyoka Cowden, MD               rOPINIRole (REQUIP) 2 MG tablet [Pharmacy Med Name: ROPINIROLE 2MG  TABLETS] 60 tablet 0    Sig: TAKE 1 TABLET BY MOUTH TWICE DAILY     Neurology:  Parkinsonian Agents Failed - 08/19/2023 12:22 PM      Failed - Last BP in normal range    BP Readings from Last 1 Encounters:  08/12/23 (!) 152/96         Failed - Last Heart Rate in normal range    Pulse Readings from Last 1 Encounters:  08/12/23 (!) 112         Failed - Valid encounter within last 12 months  Recent Outpatient Visits           1 year ago Annual physical exam   [place] [facility] at [facility], Nyoka Cowden, MD   1 year ago Severe persistent asthma, unspecified whether complicated   [place] [facility] at [facility], Nyoka Cowden, MD   2 years ago Annual physical exam   [facility] at [facility], Nyoka Cowden, MD   2 years ago Current moderate episode of major depressive disorder without prior episode [facility])   [place] [facility] at [facility], Nyoka Cowden, MD   2 years ago Reducible right inguinal hernia   [place] [facility] at [facility], Nyoka Cowden, MD               montelukast (SINGULAIR) 10 MG tablet [Pharmacy Med Name: MONTELUKAST 10MG  TABLETS] 90 tablet 3    Sig: TAKE 1 TABLET(10 MG) BY MOUTH AT BEDTIME     Pulmonology:  Leukotriene Inhibitors Failed - 08/19/2023 12:22 PM      Failed - Valid encounter within last 12 months    Recent Outpatient Visits           1 year ago Annual physical exam   [place] [facility]  at [facility], Nyoka Cowden, MD   1 year ago Severe persistent asthma, unspecified whether complicated   [place] [facility] at [facility], Nyoka Cowden, MD   2 years ago Annual physical exam   [facility] at [facility], Nyoka Cowden, MD   2 years ago Current moderate episode of major depressive disorder without prior episode [facility])   [place] [facility] Medicine at [facility], Nyoka Cowden, MD   2 years ago Reducible right inguinal hernia   [place] [facility] at [facility], Nyoka Cowden, MD               losartan (COZAAR) 50 MG tablet [Pharmacy Med Name: LOSARTAN 50MG  TABLETS] 90 tablet 1    Sig: TAKE 1 TABLET(50 MG) BY MOUTH DAILY FOR HIGH BLOOD PRESSURE     Cardiovascular:  Angiotensin Receptor Blockers Failed - 08/19/2023 12:22 PM      Failed - Last BP in normal range    BP Readings from Last 1 Encounters:  08/12/23 (!) 152/96         Failed - Valid encounter within last 6 months    Recent Outpatient Visits           1 year ago Annual physical exam   [place] [facility] at [facility], Nyoka Cowden, MD   1 year ago Severe persistent asthma, unspecified whether complicated   [place] [facility] at [facility], Nyoka Cowden, MD   2 years ago Annual physical exam   [facility] at [facility], Nyoka Cowden, MD   2 years ago Current moderate episode of major depressive disorder without prior episode [facility])   [place] [facility] Medicine at [facility], Nyoka Cowden, MD   2 years ago Reducible right inguinal hernia   [facility] & [facility] Medicine at [facility], Nyoka Cowden, MD  Passed - Cr in normal range and within 180 days    Creat  Date Value Ref Range Status  04/16/2018  0.76 0.70 - 1.25 mg/dL Final    Comment:    For patients >85 years of age, the reference limit for Creatinine is approximately 13% higher for people identified as African-American. .    Creatinine, Ser  Date Value Ref Range Status  08/12/2023 0.84 0.61 - 1.24 mg/dL Final         Passed - K in normal range and within 180 days    Potassium  Date Value Ref Range Status  08/12/2023 3.7 3.5 - 5.1 mmol/L Final  06/26/2012 4.0 3.5 - 5.1 mmol/L Final         Passed - Patient is not pregnant         Requested Prescriptions  Pending Prescriptions Disp Refills   sertraline (ZOLOFT) 50 MG tablet [Pharmacy Med Name: SERTRALINE 50MG  TABLETS] 90 tablet 1    Sig: TAKE 1 TABLET(50 MG) BY MOUTH DAILY     Psychiatry:  Antidepressants - SSRI - sertraline Failed - 08/19/2023 12:22 PM      Failed - AST in normal range and within 360 days    AST  Date Value Ref Range Status  04/25/2022 23 0 - 40 IU/L Final   SGOT(AST)  Date Value Ref Range Status  07/20/2011 18 15 - 37 Unit/L Final         Failed - ALT in normal range and within 360 days    ALT  Date Value Ref Range Status  04/25/2022 19 0 - 44 IU/L Final   SGPT (ALT)  Date Value Ref Range Status  07/20/2011 22 U/L Final    Comment:    12-78 NOTE: NEW REFERENCE RANGE 05/23/2011          Failed - Completed PHQ-2 or PHQ-9 in the last 360 days      Failed - Valid encounter within last 6 months    Recent Outpatient Visits           1 year ago Annual physical exam   [place] [facility] at [facility], Nyoka Cowden, MD   1 year ago Severe persistent asthma, unspecified whether complicated   [place] [facility] at [facility], Nyoka Cowden, MD   2 years ago Annual physical exam   [facility] Primary Care & Sports Medicine at [facility], Nyoka Cowden, MD   2 years ago Current moderate episode of major depressive disorder without prior episode [facility])   [facility] at [facility], Nyoka Cowden, MD   2 years ago Reducible right inguinal hernia   [place] [facility] at [facility], Nyoka Cowden, MD               rOPINIRole (REQUIP) 2 MG tablet [Pharmacy Med Name: ROPINIROLE 2MG  TABLETS] 60 tablet 0    Sig: TAKE 1 TABLET BY MOUTH TWICE DAILY     Neurology:  Parkinsonian Agents Failed - 08/19/2023 12:22 PM      Failed - Last BP in normal range    BP Readings from Last 1 Encounters:  08/12/23 (!) 152/96         Failed - Last Heart Rate in normal range    Pulse Readings from Last 1 Encounters:  08/12/23 (!) 112  Failed - Valid encounter within last 12 months    Recent Outpatient Visits           1 year ago Annual physical exam   [place] [facility] at [facility], Nyoka Cowden, MD   1 year ago Severe persistent asthma, unspecified whether complicated   [place] [facility] at [facility], Nyoka Cowden, MD   2 years ago Annual physical exam   [facility] at [facility], Nyoka Cowden, MD   2 years ago Current moderate episode of major depressive disorder without prior episode [facility])   [place] [facility] at [facility], Nyoka Cowden, MD   2 years ago Reducible right inguinal hernia   [place] [facility] at [facility], Nyoka Cowden, MD               montelukast (SINGULAIR) 10 MG tablet [Pharmacy Med Name: MONTELUKAST 10MG  TABLETS] 90 tablet 3    Sig: TAKE 1 TABLET(10 MG) BY MOUTH AT BEDTIME     Pulmonology:  Leukotriene Inhibitors Failed - 08/19/2023 12:22 PM      Failed - Valid encounter within last 12 months    Recent Outpatient Visits           1 year ago Annual physical exam   [place] [facility] at [facility], Nyoka Cowden, MD   1 year ago Severe  persistent asthma, unspecified whether complicated   [place] [facility] at [facility], Nyoka Cowden, MD   2 years ago Annual physical exam   [facility] at [facility], Nyoka Cowden, MD   2 years ago Current moderate episode of major depressive disorder without prior episode [facility])   [place] [facility] Care & Sports Medicine at [facility], Nyoka Cowden, MD   2 years ago Reducible right inguinal hernia   [place] [facility] Medicine at [facility], Nyoka Cowden, MD               losartan (COZAAR) 50 MG tablet [Pharmacy Med Name: LOSARTAN 50MG  TABLETS] 90 tablet 1    Sig: TAKE 1 TABLET(50 MG) BY MOUTH DAILY FOR HIGH BLOOD PRESSURE     Cardiovascular:  Angiotensin Receptor Blockers Failed - 08/19/2023 12:22 PM      Failed - Last BP in normal range    BP Readings from Last 1 Encounters:  08/12/23 (!) 152/96         Failed - Valid encounter within last 6 months    Recent Outpatient Visits           1 year ago Annual physical exam   [place] [facility] at [facility], Nyoka Cowden, MD   1 year ago Severe persistent asthma, unspecified whether complicated   [place] [facility] at [facility], Nyoka Cowden, MD   2 years ago Annual physical exam   [facility] at [facility], Nyoka Cowden, MD   2 years ago Current moderate episode of major depressive disorder without prior episode [facility])    Primary Care & Sports Medicine at [facility], Nyoka Cowden, MD   2 years ago Reducible right inguinal hernia    Primary  Care & Sports Medicine at [facility], Nyoka Cowden, MD              Passed - Cr in normal range and within 180 days    Creat  Date Value Ref Range Status  04/16/2018 0.76 0.70 - 1.25 mg/dL Final    Comment:    For patients >67  years of age, the reference limit for Creatinine is approximately 13% higher for people identified as African-American. .    Creatinine, Ser  Date Value Ref Range Status  08/12/2023 0.84 0.61 - 1.24 mg/dL Final         Passed - K in normal range and within 180 days    Potassium  Date Value Ref Range Status  08/12/2023 3.7 3.5 - 5.1 mmol/L Final  06/26/2012 4.0 3.5 - 5.1 mmol/L Final         Passed - Patient is not pregnant

## 2023-09-23 ENCOUNTER — Ambulatory Visit (INDEPENDENT_AMBULATORY_CARE_PROVIDER_SITE_OTHER): Admitting: Internal Medicine

## 2023-09-23 ENCOUNTER — Encounter: Payer: Self-pay | Admitting: Internal Medicine

## 2023-09-23 VITALS — BP 112/76 | HR 93 | Ht 72.0 in | Wt 144.0 lb

## 2023-09-23 DIAGNOSIS — J0101 Acute recurrent maxillary sinusitis: Secondary | ICD-10-CM

## 2023-09-23 DIAGNOSIS — G250 Essential tremor: Secondary | ICD-10-CM

## 2023-09-23 DIAGNOSIS — I10 Essential (primary) hypertension: Secondary | ICD-10-CM | POA: Diagnosis not present

## 2023-09-23 DIAGNOSIS — K4091 Unilateral inguinal hernia, without obstruction or gangrene, recurrent: Secondary | ICD-10-CM

## 2023-09-23 DIAGNOSIS — J439 Emphysema, unspecified: Secondary | ICD-10-CM

## 2023-09-23 DIAGNOSIS — M26642 Arthritis of left temporomandibular joint: Secondary | ICD-10-CM

## 2023-09-23 DIAGNOSIS — K219 Gastro-esophageal reflux disease without esophagitis: Secondary | ICD-10-CM

## 2023-09-23 DIAGNOSIS — F321 Major depressive disorder, single episode, moderate: Secondary | ICD-10-CM

## 2023-09-23 MED ORDER — SERTRALINE HCL 50 MG PO TABS: 50.0000 mg | ORAL_TABLET | Freq: Every day | ORAL | 1 refills | Status: DC

## 2023-09-23 MED ORDER — MONTELUKAST SODIUM 10 MG PO TABS: 10.0000 mg | ORAL_TABLET | Freq: Every day | ORAL | 3 refills | Status: DC

## 2023-09-23 MED ORDER — GABAPENTIN 600 MG PO TABS: ORAL_TABLET | ORAL | 2 refills | Status: AC

## 2023-09-23 MED ORDER — LOSARTAN POTASSIUM 50 MG PO TABS: 50.0000 mg | ORAL_TABLET | Freq: Every day | ORAL | 1 refills | Status: DC

## 2023-09-23 MED ORDER — OMEPRAZOLE 20 MG PO CPDR: 20.0000 mg | DELAYED_RELEASE_CAPSULE | Freq: Every day | ORAL | 1 refills | Status: DC

## 2023-09-23 MED ORDER — BUDESONIDE 0.5 MG/2ML IN SUSP
0.5000 mg | Freq: Every day | RESPIRATORY_TRACT | 12 refills | Status: AC
Start: 2023-09-23 — End: ?

## 2023-09-23 MED ORDER — PREDNISONE 10 MG PO TABS: ORAL_TABLET | ORAL | 0 refills | Status: AC

## 2023-09-23 MED ORDER — ROPINIROLE HCL 2 MG PO TABS: 2.0000 mg | ORAL_TABLET | Freq: Two times a day (BID) | ORAL | 0 refills | Status: DC

## 2023-09-23 MED ORDER — BACLOFEN 10 MG PO TABS: ORAL_TABLET | ORAL | 1 refills | Status: AC

## 2023-09-23 MED ORDER — AZITHROMYCIN 250 MG PO TABS: ORAL_TABLET | ORAL | 0 refills | Status: DC

## 2023-09-23 MED ORDER — MELOXICAM 15 MG PO TABS: 15.0000 mg | ORAL_TABLET | Freq: Every day | ORAL | 0 refills | Status: AC

## 2023-09-23 NOTE — Progress Notes (Signed)
 Date:  09/23/2023   Name:  David Rhodes   DOB:  [birth year]   MRN:  [id number]   Chief Complaint: Ear Fullness (Patient said his left ear fills full and difficult to hear) and Hernia  Hypertension This is a chronic problem. The problem is controlled. Associated symptoms include shortness of breath. Pertinent negatives include no headaches. Past treatments include angiotensin blockers.  Depression        This is a chronic problem.The problem is unchanged.  Associated symptoms include no fatigue and no headaches.  Past treatments include SSRIs - Selective serotonin reuptake inhibitors. Groin Pain This is a recurrent (right sided hernia) problem. The problem has been gradually worsening. Associated symptoms include abdominal pain (right groin), coughing, shortness of breath and a sore throat. Pertinent negatives include no chills, dysuria, fever or headaches.  Sinus Problem This is a recurrent problem. There has been no fever. Associated symptoms include congestion, coughing, shortness of breath, sinus pressure and a sore throat. Pertinent negatives include no chills or headaches. Past treatments include nothing.    Review of Systems  Constitutional:  Negative for chills, fatigue and fever.  HENT:  Positive for congestion, sinus pressure and sore throat.   Respiratory:  Positive for cough, chest tightness, shortness of breath and wheezing.   Gastrointestinal:  Positive for abdominal pain (right groin).  Genitourinary:  Negative for dysuria.  Neurological:  Positive for tremors. Negative for dizziness and headaches.  Psychiatric/Behavioral:  Positive for depression. Negative for dysphoric mood and sleep disturbance. The patient is not nervous/anxious.      Lab Results  Component Value Date   NA 142 08/12/2023   K 3.7 08/12/2023   CO2 22 08/12/2023   GLUCOSE 110 (H) 08/12/2023   BUN 16 08/12/2023   CREATININE 0.84 08/12/2023   CALCIUM 10.2 08/12/2023   EGFR 83 04/25/2022    GFRNONAA >60 08/12/2023   Lab Results  Component Value Date   CHOL 156 04/25/2022   HDL 63 04/25/2022   LDLCALC 82 04/25/2022   TRIG 51 04/25/2022   CHOLHDL 2.5 04/25/2022   Lab Results  Component Value Date   TSH 2.890 04/25/2022   Lab Results  Component Value Date   HGBA1C 5.3 12/25/2018   Lab Results  Component Value Date   WBC 10.2 08/12/2023   HGB 14.4 08/12/2023   HCT 43.1 08/12/2023   MCV 87.8 08/12/2023   PLT 177 08/12/2023   Lab Results  Component Value Date   ALT 19 04/25/2022   AST 23 04/25/2022   ALKPHOS 115 04/25/2022   BILITOT 0.4 04/25/2022   No results found for: "25OHVITD2", "25OHVITD3", "VD25OH"   Patient Active Problem List   Diagnosis Date Noted   Current moderate episode of major depressive disorder without prior episode (HCC) 02/26/2021   Primary osteoarthritis of first carpometacarpal joint of left hand 02/26/2021   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   Dependence on supplemental oxygen 04/18/2020   Leukocytosis 04/15/2019   Inguinal hernia recurrent bilateral 11/16/2018   Chronic left shoulder pain 10/05/2018   PICC (peripherally inserted central catheter) in place 04/16/2018   Essential tremor 10/18/2017   Cervical radiculopathy 07/23/2017   Bilateral temporomandibular joint pain 07/23/2017   Neck pain 05/19/2017   Medication monitoring encounter 09/11/2016   Pulmonary nodules 12/31/2015   COPD (chronic obstructive pulmonary disease) (HCC) 12/27/2015   Chronic cough 12/27/2015   Asthma 09/24/2015   Allergic rhinitis 11/11/2014   Nerve root pain 11/11/2014   Dyskinesia 11/11/2014  GERD (gastroesophageal reflux disease) 11/11/2014   Calcium blood increased 11/11/2014   Affective disorder (HCC) 11/11/2014   Essential hypertension 12/25/2011   Chronic back pain 12/25/2011    Allergies  Allergen Reactions   Penicillins Shortness Of Breath    PATIENT HAS HAD A PCN REACTION WITH IMMEDIATE RASH, FACIAL/TONGUE/THROAT  SWELLING, SOB, OR LIGHTHEADEDNESS WITH HYPOTENSION:  #  #  YES  #  #  Has patient had a PCN reaction causing severe rash involving mucus membranes or skin necrosis: No Has patient had a PCN reaction that required hospitalization No Has patient had a PCN reaction occurring within the last 10 years: No If all of the above answers are "NO", then may proceed with Cephalosporin use. **tolerated cefazolin and cephalexin 03/2017    Past Surgical History:  Procedure Laterality Date   DEEP BRAIN STIMULATOR PLACEMENT     INGUINAL HERNIA REPAIR Bilateral 02/07/2014   Procedure:  BILATERAL INGUINAL HERNIA REPAIR;  Surgeon: Axel Filler, MD;  Location: [facility] OR;  Service: General;  Laterality: Bilateral;   INGUINAL HERNIA REPAIR Right 07/05/2021   INSERTION OF MESH Bilateral 02/07/2014   Procedure: INSERTION OF MESH;  Surgeon: Axel Filler, MD;  Location: [facility] OR;  Service: General;  Laterality: Bilateral;   NASAL SINUS SURGERY     PULSE GENERATOR IMPLANT Left 09/11/2016   Procedure: Left chest-Change implantable pulse generator battery;  Surgeon: Maeola Harman, MD;  Location: [facility] OR;  Service: Neurosurgery;  Laterality: Left;  Left chest-Change implantable pulse generator battery   PULSE GENERATOR IMPLANT Left 04/06/2018   Procedure: Revision of left chest implantable pulse generator, extension, and pocket adapter;  Surgeon: Maeola Harman, MD;  [facility] OR;  Service: Neurosurgery;  Laterality: Left;   SUBTHALAMIC STIMULATOR BATTERY REPLACEMENT  12/09/2011   Procedure: SUBTHALAMIC STIMULATOR BATTERY REPLACEMENT;  Surgeon: Maeola Harman, MD;  Location: [facility];  Service: Neurosurgery;  Laterality: N/A;   deep brain stimulator, implantable pulse generator change   SUBTHALAMIC STIMULATOR BATTERY REPLACEMENT Left 04/21/2014   Procedure: Deep brain stimulator battery change;  Surgeon: Maeola Harman, MD;  Location: [facility];  Service: Neurosurgery;  Laterality: Left;  Deep brain stimulator battery  change   SUBTHALAMIC STIMULATOR BATTERY REPLACEMENT N/A 08/10/2018   Procedure: Replacement of deep brain stimulator pulse generator/left abdomen with long lead extensions;  Surgeon: Maeola Harman, MD;  Location: [facility] OR;  Service: Neurosurgery;  Laterality: N/A;    Social History   Tobacco Use   Smoking status: Never    Passive exposure: Past   Smokeless tobacco: Current    Types: Chew   Tobacco comments:    Trying to taper off.  Too expensive  Vaping Use   Vaping status: Never Used  Substance Use Topics   Alcohol use: No    Alcohol/week: 0.0 standard drinks of alcohol   Drug use: No     Medication list has been reviewed and updated.  Current Meds  Medication Sig   albuterol (VENTOLIN HFA) 108 (90 Base) MCG/ACT inhaler Inhale 2 puffs into the lungs every 6 (six) hours as needed for wheezing or shortness of breath.   budesonide (PULMICORT) 0.5 MG/2ML nebulizer solution Take 2 mLs (0.5 mg total) by nebulization daily.   loratadine (CLARITIN) 10 MG tablet Take 1 tablet (10 mg total) by mouth daily.   OXYGEN Inhale into the lungs. 3 L of oxygen   predniSONE (DELTASONE) 10 MG tablet Take 6 tablets (60 mg total) by mouth daily with breakfast for 2 days, THEN 5 tablets (  50 mg total) daily with breakfast for 2 days, THEN 4 tablets (40 mg total) daily with breakfast for 2 days, THEN 3 tablets (30 mg total) daily with breakfast for 2 days, THEN 2 tablets (20 mg total) daily with breakfast for 2 days, THEN 1 tablet (10 mg total) daily with breakfast for 2 days.   [DISCONTINUED] azithromycin (ZITHROMAX Z-PAK) 250 MG tablet Take 2 tablets on day 1 then 1 tablet daily   [DISCONTINUED] baclofen (LIORESAL) 10 MG tablet TAKE 1 TABLET(10 MG) BY MOUTH THREE TIMES DAILY   [DISCONTINUED] fluticasone furoate-vilanterol (BREO ELLIPTA) 100-25 MCG/ACT AEPB Inhale 1 puff into the lungs daily.   [DISCONTINUED] gabapentin (NEURONTIN) 600 MG tablet TAKE 1 TABLET BY MOUTH EVERY MORNING, 1 TABLET AT NOON, 1  TABLET EVERY EVENING AND 1 TABLET EVERY NIGHT AT BEDTIME   [DISCONTINUED] losartan (COZAAR) 50 MG tablet Take 1 tablet (50 mg total) by mouth daily. For high BP   [DISCONTINUED] meloxicam (MOBIC) 15 MG tablet TAKE 1 TABLET(15 MG) BY MOUTH DAILY   [DISCONTINUED] montelukast (SINGULAIR) 10 MG tablet Take 1 tablet (10 mg total) by mouth at bedtime. TAKE 1 TABLET(10 MG) BY MOUTH AT BEDTIME   [DISCONTINUED] omeprazole (PRILOSEC) 20 MG capsule Take 1 capsule (20 mg total) by mouth daily.   [DISCONTINUED] rOPINIRole (REQUIP) 2 MG tablet TAKE 1 TABLET BY MOUTH TWICE DAILY   [DISCONTINUED] sertraline (ZOLOFT) 50 MG tablet Take 1 tablet (50 mg total) by mouth daily.       04/25/2022    8:44 AM 10/21/2021    8:28 AM 04/22/2021    9:23 AM 02/26/2021   10:19 AM  GAD 7 : Generalized Anxiety Score  Nervous, Anxious, on Edge 3 0 0 0  Control/stop worrying 0 0 0 0  Worry too much - different things 0 0 0 0  Trouble relaxing 2 0 1 1  Restless 3 0 0 0  Easily annoyed or irritable 0 0 0 1  Afraid - awful might happen 0 0 0 0  Total GAD 7 Score 8 0 1 2  Anxiety Difficulty Extremely difficult  Not difficult at all        04/30/2022   10:21 AM 04/25/2022    8:42 AM 10/21/2021    8:28 AM  Depression screen PHQ 2/9  Decreased Interest 2 2 0  Down, Depressed, Hopeless 2 1 2   PHQ - 2 Score 4 3 2   Altered sleeping 1 1 1   Tired, decreased energy 3 2 2   Change in appetite 1 1 3   Feeling bad or failure about yourself  2 2 0  Trouble concentrating 1 1 0  Moving slowly or fidgety/restless 3 3 0  Suicidal thoughts 0 0 0  PHQ-9 Score 15 13 8   Difficult doing work/chores Very difficult Very difficult Not difficult at all    BP Readings from Last 3 Encounters:  09/23/23 112/76  08/12/23 (!) 152/96  04/28/23 (!) 149/87    Physical Exam HENT:     Right Ear: There is impacted cerumen.     Left Ear: There is impacted cerumen.     Nose:     Right Sinus: Maxillary sinus tenderness present. No frontal  sinus tenderness.     Left Sinus: Maxillary sinus tenderness present. No frontal sinus tenderness.     Mouth/Throat:     Pharynx: Oropharyngeal exudate and posterior oropharyngeal erythema present.  Neck:     Vascular: No carotid bruit.  Cardiovascular:     Rate and Rhythm:  Normal rate and regular rhythm.     Heart sounds: No murmur heard. Pulmonary:     Effort: Pulmonary effort is normal.     Breath sounds: Decreased breath sounds present. No wheezing.  Musculoskeletal:     Cervical back: Normal range of motion.  Lymphadenopathy:     Cervical: No cervical adenopathy.  Neurological:     Mental Status: He is alert.     Motor: Tremor present.     Gait: Gait is intact.     Wt Readings from Last 3 Encounters:  09/23/23 144 lb (65.3 kg)  08/12/23 149 lb 14.4 oz (68 kg)  04/28/23 160 lb (72.6 kg)    BP 112/76   Pulse 93   Ht 6' (1.829 m)   Wt 144 lb (65.3 kg)   SpO2 96%   BMI 19.53 kg/m   Assessment and Plan:  Problem List Items Addressed This Visit       Unprioritized   Essential hypertension (Chronic)   Blood pressure is well controlled.  Current medications losartan Will continue same regimen along with efforts to limit dietary sodium.       Relevant Medications   losartan (COZAAR) 50 MG tablet   GERD (gastroesophageal reflux disease) (Chronic)   Continue PPI      Relevant Medications   omeprazole (PRILOSEC) 20 MG capsule   COPD (chronic obstructive pulmonary disease) (HCC) (Chronic)   On chronic oxygen, uses albuterol MDI Has had Breo in the past but not using regularly Will add Pulmicort nebs      Relevant Medications   montelukast (SINGULAIR) 10 MG tablet   predniSONE (DELTASONE) 10 MG tablet   azithromycin (ZITHROMAX Z-PAK) 250 MG tablet   budesonide (PULMICORT) 0.5 MG/2ML nebulizer solution   Essential tremor (Chronic)   He is due for battery replacement for his deep brain stimulator but they won't do anything if he has any infection  symptoms. Will treat his sinusitis and he will see Neurosurgery immediately after that course is complete.      Relevant Medications   baclofen (LIORESAL) 10 MG tablet   gabapentin (NEURONTIN) 600 MG tablet   Current moderate episode of major depressive disorder without prior episode (HCC) (Chronic)   Symptoms of depression well controlled on Sertraline.      Relevant Medications   sertraline (ZOLOFT) 50 MG tablet   Arthritis of left temporomandibular joint   Refill Meloxicam and baclofen      Relevant Medications   baclofen (LIORESAL) 10 MG tablet   meloxicam (MOBIC) 15 MG tablet   predniSONE (DELTASONE) 10 MG tablet   Other Visit Diagnoses       Acute recurrent maxillary sinusitis    -  Primary   Relevant Medications   predniSONE (DELTASONE) 10 MG tablet   azithromycin (ZITHROMAX Z-PAK) 250 MG tablet     Recurrent inguinal hernia of right side without obstruction or gangrene       Relevant Orders   Ambulatory referral to General Surgery       No follow-ups on file.    Reubin Milan, MD [facility] [place]

## 2023-09-23 NOTE — Assessment & Plan Note (Signed)
 He is due for battery replacement for his deep brain stimulator but they won't do anything if he has any infection symptoms. Will treat his sinusitis and he will see Neurosurgery immediately after that course is complete.

## 2023-09-23 NOTE — Assessment & Plan Note (Signed)
 Refill Meloxicam and baclofen

## 2023-09-23 NOTE — Assessment & Plan Note (Signed)
 Symptoms of depression well controlled on Sertraline.

## 2023-09-23 NOTE — Assessment & Plan Note (Signed)
 Blood pressure is well controlled.  Current medications losartan Will continue same regimen along with efforts to limit dietary sodium.

## 2023-09-23 NOTE — Assessment & Plan Note (Signed)
 On chronic oxygen, uses albuterol MDI Has had Breo in the past but not using regularly Will add Pulmicort nebs

## 2023-09-23 NOTE — Assessment & Plan Note (Signed)
 Continue PPI ?

## 2023-09-28 ENCOUNTER — Other Ambulatory Visit: Payer: Self-pay | Admitting: Internal Medicine

## 2023-09-28 ENCOUNTER — Telehealth: Payer: Self-pay

## 2023-09-28 DIAGNOSIS — G250 Essential tremor: Secondary | ICD-10-CM

## 2023-09-28 NOTE — Progress Notes (Unsigned)
 Date:  09/28/2023   Name:  David Rhodes   DOB:  [birth year]   MRN:  [id number]   Chief Complaint: No chief complaint on file.  HPI  Review of Systems   Lab Results  Component Value Date   NA 142 08/12/2023   K 3.7 08/12/2023   CO2 22 08/12/2023   GLUCOSE 110 (H) 08/12/2023   BUN 16 08/12/2023   CREATININE 0.84 08/12/2023   CALCIUM 10.2 08/12/2023   EGFR 83 04/25/2022   GFRNONAA >60 08/12/2023   Lab Results  Component Value Date   CHOL 156 04/25/2022   HDL 63 04/25/2022   LDLCALC 82 04/25/2022   TRIG 51 04/25/2022   CHOLHDL 2.5 04/25/2022   Lab Results  Component Value Date   TSH 2.890 04/25/2022   Lab Results  Component Value Date   HGBA1C 5.3 12/25/2018   Lab Results  Component Value Date   WBC 10.2 08/12/2023   HGB 14.4 08/12/2023   HCT 43.1 08/12/2023   MCV 87.8 08/12/2023   PLT 177 08/12/2023   Lab Results  Component Value Date   ALT 19 04/25/2022   AST 23 04/25/2022   ALKPHOS 115 04/25/2022   BILITOT 0.4 04/25/2022   No results found for: "25OHVITD2", "25OHVITD3", "VD25OH"   Patient Active Problem List   Diagnosis Date Noted   Current moderate episode of major depressive disorder without prior episode (HCC) 02/26/2021   Primary osteoarthritis of first carpometacarpal joint of left hand 02/26/2021   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   Dependence on supplemental oxygen 04/18/2020   Leukocytosis 04/15/2019   Inguinal hernia recurrent bilateral 11/16/2018   Chronic left shoulder pain 10/05/2018   PICC (peripherally inserted central catheter) in place 04/16/2018   Essential tremor 10/18/2017   Cervical radiculopathy 07/23/2017   Bilateral temporomandibular joint pain 07/23/2017   Neck pain 05/19/2017   Medication monitoring encounter 09/11/2016   Pulmonary nodules 12/31/2015   COPD (chronic obstructive pulmonary disease) (HCC) 12/27/2015   Chronic cough 12/27/2015   Asthma 09/24/2015   Allergic rhinitis 11/11/2014    Nerve root pain 11/11/2014   Dyskinesia 11/11/2014   GERD (gastroesophageal reflux disease) 11/11/2014   Calcium blood increased 11/11/2014   Affective disorder (HCC) 11/11/2014   Essential hypertension 12/25/2011   Chronic back pain 12/25/2011    Allergies  Allergen Reactions   Penicillins Shortness Of Breath    PATIENT HAS HAD A PCN REACTION WITH IMMEDIATE RASH, FACIAL/TONGUE/THROAT SWELLING, SOB, OR LIGHTHEADEDNESS WITH HYPOTENSION:  #  #  YES  #  #  Has patient had a PCN reaction causing severe rash involving mucus membranes or skin necrosis: No Has patient had a PCN reaction that required hospitalization No Has patient had a PCN reaction occurring within the last 10 years: No If all of the above answers are "NO", then may proceed with Cephalosporin use. **tolerated cefazolin and cephalexin 03/2017    Past Surgical History:  Procedure Laterality Date   DEEP BRAIN STIMULATOR PLACEMENT     INGUINAL HERNIA REPAIR Bilateral 02/07/2014   Procedure:  BILATERAL INGUINAL HERNIA REPAIR;  Surgeon: Axel Filler, MD;  Location: [facility] OR;  Service: General;  Laterality: Bilateral;   INGUINAL HERNIA REPAIR Right 07/05/2021   INSERTION OF MESH Bilateral 02/07/2014   Procedure: INSERTION OF MESH;  Surgeon: Axel Filler, MD;  Location: [facility] OR;  Service: General;  Laterality: Bilateral;   NASAL SINUS SURGERY     PULSE GENERATOR IMPLANT Left 09/11/2016   Procedure: Left chest-Change  implantable pulse generator battery;  Surgeon: Maeola Harman, MD;  Location: [facility] OR;  Service: Neurosurgery;  Laterality: Left;  Left chest-Change implantable pulse generator battery   PULSE GENERATOR IMPLANT Left 04/06/2018   Procedure: Revision of left chest implantable pulse generator, extension, and pocket adapter;  Surgeon: Maeola Harman, MD;  Location: [facility] OR;  Service: Neurosurgery;  Laterality: Left;   SUBTHALAMIC STIMULATOR BATTERY REPLACEMENT  12/09/2011   Procedure: SUBTHALAMIC STIMULATOR BATTERY REPLACEMENT;   Surgeon: Maeola Harman, MD;  Location: [facility];  Service: Neurosurgery;  Laterality: N/A;   deep brain stimulator, implantable pulse generator change   SUBTHALAMIC STIMULATOR BATTERY REPLACEMENT Left 04/21/2014   Procedure: Deep brain stimulator battery change;  Surgeon: Maeola Harman, MD;  Location: [facility];  Service: Neurosurgery;  Laterality: Left;  Deep brain stimulator battery change   SUBTHALAMIC STIMULATOR BATTERY REPLACEMENT N/A 08/10/2018   Procedure: Replacement of deep brain stimulator pulse generator/left abdomen with long lead extensions;  Surgeon: Maeola Harman, MD;  Location: [facility] OR;  Service: Neurosurgery;  Laterality: N/A;    Social History   Tobacco Use   Smoking status: Never    Passive exposure: Past   Smokeless tobacco: Current    Types: Chew   Tobacco comments:    Trying to taper off.  Too expensive  Vaping Use   Vaping status: Never Used  Substance Use Topics   Alcohol use: No    Alcohol/week: 0.0 standard drinks of alcohol   Drug use: No     Medication list has been reviewed and updated.  No outpatient medications have been marked as taking for the 09/28/23 encounter (Orders Only) with Reubin Milan, MD.       04/25/2022    8:44 AM 10/21/2021    8:28 AM 04/22/2021    9:23 AM 02/26/2021   10:19 AM  GAD 7 : Generalized Anxiety Score  Nervous, Anxious, on Edge 3 0 0 0  Control/stop worrying 0 0 0 0  Worry too much - different things 0 0 0 0  Trouble relaxing 2 0 1 1  Restless 3 0 0 0  Easily annoyed or irritable 0 0 0 1  Afraid - awful might happen 0 0 0 0  Total GAD 7 Score 8 0 1 2  Anxiety Difficulty Extremely difficult  Not difficult at all        04/30/2022   10:21 AM 04/25/2022    8:42 AM 10/21/2021    8:28 AM  Depression screen PHQ 2/9  Decreased Interest 2 2 0  Down, Depressed, Hopeless 2 1 2   PHQ - 2 Score 4 3 2   Altered sleeping 1 1 1   Tired, decreased energy 3 2 2   Change in appetite 1 1 3   Feeling bad or failure about  yourself  2 2 0  Trouble concentrating 1 1 0  Moving slowly or fidgety/restless 3 3 0  Suicidal thoughts 0 0 0  PHQ-9 Score 15 13 8   Difficult doing work/chores Very difficult Very difficult Not difficult at all    BP Readings from Last 3 Encounters:  09/23/23 112/76  08/12/23 (!) 152/96  04/28/23 (!) 149/87    Physical Exam  Wt Readings from Last 3 Encounters:  09/23/23 144 lb (65.3 kg)  08/12/23 149 lb 14.4 oz (68 kg)  04/28/23 160 lb (72.6 kg)    There were no vitals taken for this visit.  Assessment and Plan:  Problem List Items Addressed This Visit   None   No  follow-ups on file.    Reubin Milan, MD [facility] [place]

## 2023-09-28 NOTE — Telephone Encounter (Signed)
 Copied from CRM [id number]. Topic: Referral - Request for Referral >> Sep 28, 2023  2:05 PM Yolanda T wrote: Did the patient discuss referral with their provider in the last year? Yes  Appointment offered? No  Type of order/referral and detailed reason for visit: Neurosurgeon  Preference of office, provider, location: [facility] [address] [place], [place]  If referral order, have you been seen by this specialty before? Yes (If Yes, this issue or another issue? When? Where? [facility]   Can we respond through MyChart? No

## 2023-09-29 NOTE — Telephone Encounter (Signed)
 No answer to phone call. Left patient message on personal voicemail box, regarding the referral PCP has placed for him.

## 2023-10-05 NOTE — H&P (View-Only) (Signed)
 Referring Physician:  Sheron Dixons, MD [address] [place],  [place] [address]  Primary Physician:  Sheron Dixons, MD  History of Present Illness: 10/08/2023 David Rhodes is here today with a chief complaint of battery at end-of-life.  He has past history of deep brain stimulator placed for tremor.  His battery has been out.  He presents today for placement.    Past Surgery:  Deep Brain Stimulator placed   The symptoms are causing a significant impact on the patient's life.   I have utilized the care everywhere function in epic to review the outside records available from external health systems.  Review of Systems:  A 10 point review of systems is negative, except for the pertinent positives and negatives detailed in the HPI.  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   Inguinal hernia    bilateral   Pneumonia    many years ago   Shortness of breath    when allergies flare up   Urine incontinence    Wears glasses     Past Surgical History: Past Surgical History:  Procedure Laterality Date   DEEP BRAIN STIMULATOR PLACEMENT     INGUINAL HERNIA REPAIR Bilateral 02/07/2014   Procedure:  BILATERAL INGUINAL HERNIA REPAIR;  Surgeon: Shela Derby, MD;  Location: [facility] OR;  Service: General;  Laterality: Bilateral;   INGUINAL HERNIA REPAIR Right 07/05/2021   INSERTION OF MESH Bilateral 02/07/2014   Procedure: INSERTION OF MESH;  Surgeon: Shela Derby, MD;  Location: [facility] OR;  Service: General;  Laterality: Bilateral;   NASAL SINUS SURGERY     PULSE GENERATOR IMPLANT Left 09/11/2016   Procedure: Left  chest-Change implantable pulse generator battery;  Surgeon: Manya Sells, MD;  Location: [facility] OR;  Service: Neurosurgery;  Laterality: Left;  Left chest-Change implantable pulse generator battery   PULSE GENERATOR IMPLANT Left 04/06/2018   Procedure: Revision of left chest implantable pulse generator, extension, and pocket adapter;  Surgeon: Manya Sells, MD;  Location: [facility] OR;  Service: Neurosurgery;  Laterality: Left;   SUBTHALAMIC STIMULATOR BATTERY REPLACEMENT  12/09/2011   Procedure: SUBTHALAMIC STIMULATOR BATTERY REPLACEMENT;  Surgeon: Manya Sells, MD;  Location: [facility];  Service: Neurosurgery;  Laterality: N/A;   deep brain stimulator, implantable pulse generator change   SUBTHALAMIC STIMULATOR BATTERY REPLACEMENT Left 04/21/2014   Procedure: Deep brain stimulator battery change;  Surgeon: Manya Sells, MD;  Location: [facility];  Service: Neurosurgery;  Laterality: Left;  Deep brain stimulator battery change   SUBTHALAMIC STIMULATOR BATTERY REPLACEMENT N/A 08/10/2018   Procedure: Replacement of deep brain stimulator pulse generator/left abdomen with long lead extensions;  Surgeon: Manya Sells, MD;  Location: [facility] OR;  Service: Neurosurgery;  Laterality: N/A;    Allergies: Allergies as of 10/08/2023 - Review Complete 10/08/2023  Allergen Reaction Noted   Penicillins Shortness Of Breath 12/03/2011    Medications:  Current Outpatient Medications:    albuterol (VENTOLIN HFA) 108 (90 Base) MCG/ACT inhaler, Inhale 2 puffs into the lungs every 6 (six) hours as needed for  wheezing or shortness of breath., Disp: 8 g, Rfl: 1   azithromycin (ZITHROMAX Z-PAK) 250 MG tablet, Take 2 tablets on day 1 then 1 tablet daily, Disp: 6 tablet, Rfl: 0   baclofen (LIORESAL) 10 MG tablet, TAKE 1 TABLET(10 MG) BY MOUTH THREE TIMES DAILY, Disp: 90 tablet, Rfl: 1   budesonide (PULMICORT) 0.5 MG/2ML nebulizer solution, Take 2 mLs (0.5 mg total) by nebulization daily., Disp: 60 mL, Rfl: 12   gabapentin  (NEURONTIN) 600 MG tablet, TAKE 1 TABLET BY MOUTH EVERY MORNING, 1 TABLET AT NOON, 1 TABLET EVERY EVENING AND 1 TABLET EVERY NIGHT AT BEDTIME, Disp: 360 tablet, Rfl: 2   loratadine (CLARITIN) 10 MG tablet, Take 1 tablet (10 mg total) by mouth daily., Disp: 30 tablet, Rfl: 0   losartan (COZAAR) 50 MG tablet, Take 1 tablet (50 mg total) by mouth daily. For high BP, Disp: 90 tablet, Rfl: 1   meloxicam (MOBIC) 15 MG tablet, Take 1 tablet (15 mg total) by mouth daily., Disp: 90 tablet, Rfl: 0   montelukast (SINGULAIR) 10 MG tablet, Take 1 tablet (10 mg total) by mouth at bedtime. TAKE 1 TABLET(10 MG) BY MOUTH AT BEDTIME, Disp: 90 tablet, Rfl: 3   omeprazole (PRILOSEC) 20 MG capsule, Take 1 capsule (20 mg total) by mouth daily., Disp: 90 capsule, Rfl: 1   OXYGEN, Inhale into the lungs. 3 L of oxygen, Disp: , Rfl:    rOPINIRole (REQUIP) 2 MG tablet, Take 1 tablet (2 mg total) by mouth 2 (two) times daily., Disp: 60 tablet, Rfl: 0   sertraline (ZOLOFT) 50 MG tablet, Take 1 tablet (50 mg total) by mouth daily., Disp: 90 tablet, Rfl: 1  Social History: Social History   Tobacco Use   Smoking status: Never    Passive exposure: Past   Smokeless tobacco: Current    Types: Chew   Tobacco comments:    Trying to taper off.  Too expensive  Vaping Use   Vaping status: Never Used  Substance Use Topics   Alcohol use: No    Alcohol/week: 0.0 standard drinks of alcohol   Drug use: No    Family Medical History: Family History  Problem Relation Age of Onset   Myoclonus Mother    Breast cancer Mother    Prostate cancer Father    Heart disease Paternal Grandfather    Myoclonus Maternal Uncle     Physical Examination: Vitals:   10/08/23 1104  BP: 128/78    General: Patient is in no apparent distress. Attention to examination is appropriate.  Neck:   Supple.  Full range of motion.  Respiratory: Patient is breathing without any difficulty.   NEUROLOGICAL:     Awake, alert, oriented to person,  place, and time.  Speech is clear and fluent.   Cranial Nerves: Pupils equal round and reactive to light.  Facial tone is symmetric.  Facial sensation is symmetric. Shoulder shrug is symmetric. Tongue protrusion is midline.    He has tremor.  Strength: Side Biceps Triceps Deltoid Interossei Grip Wrist Ext. Wrist Flex.  R 5 5 5 5 5 5 5   L 5 5 5 5 5 5 5    Side Iliopsoas Quads Hamstring PF DF EHL  R 5 5 5 5 5 5   L 5 5 5 5 5 5    Reflexes are 1+ and symmetric at the biceps, triceps, brachioradialis, patella and achilles.   Hoffman's is absent.   Bilateral upper and lower extremity sensation is intact to light touch.  No evidence of dysmetria noted.  Gait is normal.     Medical Decision Making  Imaging: No pertinent new imaging  Abdominal x-ray shows pulse generator in the left lower quadrant superficially.  I have personally reviewed the images and agree with the above interpretation.  Assessment and Plan: David Rhodes is a pleasant 68 y.o. male with essential tremor status post deep brain stimulator placement with battery at end-of-life.  He has requested replacement.  I think this is reasonable to proceed.  We discussed the importance of compliance to medical care.  He expressed that he will do his best to maintain a relationship with a neurologist.  I discussed the planned procedure at length with the patient, including the risks, benefits, alternatives, and indications. The risks discussed include but are not limited to bleeding, infection, need for reoperation, spinal fluid leak, stroke, vision loss, anesthetic complication, coma, paralysis, and even death. I also described in detail that improvement was not guaranteed.  The patient expressed understanding of these risks, and asked that we proceed with surgery. I described the surgery in layman's terms, and gave ample opportunity for questions, which were answered to the best of my ability.  I spent a total of 30 minutes in  this patient's care today. This time was spent reviewing pertinent records including imaging studies, obtaining and confirming history, performing a directed evaluation, formulating and discussing my recommendations, and documenting the visit within the medical record.        Thank you for involving me in the care of this patient.      Anisah Kuck K. Mont Antis MD, [facility] Neurosurgery

## 2023-10-05 NOTE — Progress Notes (Unsigned)
 Referring Physician:  Reubin Milan, MD [address] [place],  [place] [address]  Primary Physician:  Reubin Milan, MD  History of Present Illness: 10/05/2023 David Rhodes is here today with a chief complaint of ***   Patient has a deep brain stimulator and needs to have his battery changed.    Past Surgery: *** Deep Brain Stimulator placed in ***  [name] has ***no symptoms of cervical myelopathy.  The symptoms are causing a significant impact on the patient's life.   I have utilized the care everywhere function in epic to review the outside records available from external health systems.  Review of Systems:  A 10 point review of systems is negative, except for the pertinent positives and negatives detailed in the HPI.  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   Inguinal hernia    bilateral   Pneumonia    many years ago   Shortness of breath    when allergies flare up   Urine incontinence    Wears glasses     Past Surgical History: Past Surgical History:  Procedure Laterality Date   DEEP BRAIN STIMULATOR PLACEMENT     INGUINAL HERNIA REPAIR Bilateral 02/07/2014   Procedure:  BILATERAL INGUINAL HERNIA REPAIR;  Surgeon: Axel Filler, MD;  Location: [facility] OR;  Service: General;  Laterality: Bilateral;   INGUINAL HERNIA REPAIR Right 07/05/2021   INSERTION OF MESH Bilateral 02/07/2014   Procedure: INSERTION OF MESH;  Surgeon: Axel Filler, MD;  Location: [facility] OR;  Service: General;  Laterality: Bilateral;   NASAL SINUS SURGERY     PULSE GENERATOR IMPLANT Left 09/11/2016   Procedure: Left  chest-Change implantable pulse generator battery;  Surgeon: Maeola Harman, MD;  Location: [facility] OR;  Service: Neurosurgery;  Laterality: Left;  Left chest-Change implantable pulse generator battery   PULSE GENERATOR IMPLANT Left 04/06/2018   Procedure: Revision of left chest implantable pulse generator, extension, and pocket adapter;  Surgeon: Maeola Harman, MD;  Location: [facility] OR;  Service: Neurosurgery;  Laterality: Left;   SUBTHALAMIC STIMULATOR BATTERY REPLACEMENT  12/09/2011   Procedure: SUBTHALAMIC STIMULATOR BATTERY REPLACEMENT;  Surgeon: Maeola Harman, MD;  Location: [facility];  Service: Neurosurgery;  Laterality: N/A;   deep brain stimulator, implantable pulse generator change   SUBTHALAMIC STIMULATOR BATTERY REPLACEMENT Left 04/21/2014   Procedure: Deep brain stimulator battery change;  Surgeon: Maeola Harman, MD;  Location: [facility];  Service: Neurosurgery;  Laterality: Left;  Deep brain stimulator battery change   SUBTHALAMIC STIMULATOR BATTERY REPLACEMENT N/A 08/10/2018   Procedure: Replacement of deep brain stimulator pulse generator/left abdomen with long lead extensions;  Surgeon: Maeola Harman, MD;  Location: [facility] OR;  Service: Neurosurgery;  Laterality: N/A;    Allergies: Allergies as of 10/08/2023 - Review Complete 09/23/2023  Allergen Reaction Noted   Penicillins Shortness Of Breath 12/03/2011    Medications:  Current Outpatient Medications:    albuterol (VENTOLIN HFA) 108 (90 Base) MCG/ACT inhaler, Inhale 2 puffs into the lungs every 6 (six) hours as needed for  wheezing or shortness of breath., Disp: 8 g, Rfl: 1   azithromycin (ZITHROMAX Z-PAK) 250 MG tablet, Take 2 tablets on day 1 then 1 tablet daily, Disp: 6 tablet, Rfl: 0   baclofen (LIORESAL) 10 MG tablet, TAKE 1 TABLET(10 MG) BY MOUTH THREE TIMES DAILY, Disp: 90 tablet, Rfl: 1   budesonide (PULMICORT) 0.5 MG/2ML nebulizer solution, Take 2 mLs (0.5 mg total) by nebulization daily., Disp: 60 mL, Rfl: 12   gabapentin  (NEURONTIN) 600 MG tablet, TAKE 1 TABLET BY MOUTH EVERY MORNING, 1 TABLET AT NOON, 1 TABLET EVERY EVENING AND 1 TABLET EVERY NIGHT AT BEDTIME, Disp: 360 tablet, Rfl: 2   loratadine (CLARITIN) 10 MG tablet, Take 1 tablet (10 mg total) by mouth daily., Disp: 30 tablet, Rfl: 0   losartan (COZAAR) 50 MG tablet, Take 1 tablet (50 mg total) by mouth daily. For high BP, Disp: 90 tablet, Rfl: 1   meloxicam (MOBIC) 15 MG tablet, Take 1 tablet (15 mg total) by mouth daily., Disp: 90 tablet, Rfl: 0   montelukast (SINGULAIR) 10 MG tablet, Take 1 tablet (10 mg total) by mouth at bedtime. TAKE 1 TABLET(10 MG) BY MOUTH AT BEDTIME, Disp: 90 tablet, Rfl: 3   omeprazole (PRILOSEC) 20 MG capsule, Take 1 capsule (20 mg total) by mouth daily., Disp: 90 capsule, Rfl: 1   OXYGEN, Inhale into the lungs. 3 L of oxygen, Disp: , Rfl:    predniSONE (DELTASONE) 10 MG tablet, Take 6 tablets (60 mg total) by mouth daily with breakfast for 2 days, THEN 5 tablets (50 mg total) daily with breakfast for 2 days, THEN 4 tablets (40 mg total) daily with breakfast for 2 days, THEN 3 tablets (30 mg total) daily with breakfast for 2 days, THEN 2 tablets (20 mg total) daily with breakfast for 2 days, THEN 1 tablet (10 mg total) daily with breakfast for 2 days., Disp: 42 tablet, Rfl: 0   rOPINIRole (REQUIP) 2 MG tablet, Take 1 tablet (2 mg total) by mouth 2 (two) times daily., Disp: 60 tablet, Rfl: 0   sertraline (ZOLOFT) 50 MG tablet, Take 1 tablet (50 mg total) by mouth daily., Disp: 90 tablet, Rfl: 1  Social History: Social History   Tobacco Use   Smoking status: Never    Passive exposure: Past   Smokeless tobacco: Current    Types: Chew   Tobacco comments:    Trying to taper off.  Too expensive  Vaping Use   Vaping status: Never Used  Substance Use Topics   Alcohol use: No    Alcohol/week: 0.0 standard drinks of alcohol   Drug use: No    Family Medical History: Family History  Problem Relation Age of Onset   Myoclonus  Mother    Breast cancer Mother    Prostate cancer Father    Heart disease Paternal Grandfather    Myoclonus Maternal Uncle     Physical Examination: There were no vitals filed for this visit.  General: Patient is in no apparent distress. Attention to examination is appropriate.  Neck:   Supple.  Full range of motion.  Respiratory: Patient is breathing without any difficulty.   NEUROLOGICAL:     Awake, alert, oriented to person, place, and time.  Speech is clear and fluent.   Cranial Nerves: Pupils equal round and reactive to light.  Facial tone is symmetric.  Facial sensation is symmetric. Shoulder shrug is symmetric. Tongue protrusion is midline.  There is no pronator drift.  Strength: Side Biceps Triceps  Deltoid Interossei Grip Wrist Ext. Wrist Flex.  R 5 5 5 5 5 5 5   L 5 5 5 5 5 5 5    Side Iliopsoas Quads Hamstring PF DF EHL  R 5 5 5 5 5 5   L 5 5 5 5 5 5    Reflexes are ***2+ and symmetric at the biceps, triceps, brachioradialis, patella and achilles.   Hoffman's is absent.   Bilateral upper and lower extremity sensation is intact to light touch.    No evidence of dysmetria noted.  Gait is normal.     Medical Decision Making  Imaging: ***  I have personally reviewed the images and agree with the above interpretation.  Assessment and Plan: Mr. Trudell is a pleasant 68 y.o. male with ***      Thank you for involving me in the care of this patient.      Roczen Waymire K. Myer Haff MD, [facility] Neurosurgery

## 2023-10-08 ENCOUNTER — Encounter: Payer: Self-pay | Admitting: Neurosurgery

## 2023-10-08 ENCOUNTER — Other Ambulatory Visit: Payer: Self-pay

## 2023-10-08 ENCOUNTER — Encounter
Admission: RE | Admit: 2023-10-08 | Discharge: 2023-10-08 | Disposition: A | Discharge status: Home / Self Care | Source: Ambulatory Visit | Attending: Neurosurgery | Admitting: Neurosurgery

## 2023-10-08 ENCOUNTER — Ambulatory Visit (INDEPENDENT_AMBULATORY_CARE_PROVIDER_SITE_OTHER): Admitting: Neurosurgery

## 2023-10-08 VITALS — BP 128/78 | Ht 72.0 in | Wt 140.0 lb

## 2023-10-08 VITALS — Ht 72.0 in | Wt 144.0 lb

## 2023-10-08 DIAGNOSIS — G250 Essential tremor: Secondary | ICD-10-CM | POA: Diagnosis not present

## 2023-10-08 DIAGNOSIS — Z01818 Encounter for other preprocedural examination: Secondary | ICD-10-CM

## 2023-10-08 DIAGNOSIS — Z4542 Encounter for adjustment and management of neuropacemaker (brain) (peripheral nerve) (spinal cord): Secondary | ICD-10-CM

## 2023-10-08 DIAGNOSIS — Z01812 Encounter for preprocedural laboratory examination: Secondary | ICD-10-CM | POA: Diagnosis present

## 2023-10-08 HISTORY — DX: Multiple fractures of ribs, unspecified side, initial encounter for closed fracture: S22.49XA

## 2023-10-08 HISTORY — DX: Moderate persistent asthma, uncomplicated: J45.40

## 2023-10-08 HISTORY — DX: Bilateral inguinal hernia, without obstruction or gangrene, not specified as recurrent: K40.20

## 2023-10-08 HISTORY — DX: Radiculopathy, cervical region: M54.12

## 2023-10-08 HISTORY — DX: Essential (primary) hypertension: I10

## 2023-10-08 LAB — SURGICAL PCR SCREEN
MRSA, PCR: NEGATIVE
Staphylococcus aureus: NEGATIVE

## 2023-10-08 NOTE — Addendum Note (Signed)
 Addended by: Sharlot Gowda on: 10/08/2023 11:48 AM   Modules accepted: Orders

## 2023-10-08 NOTE — Patient Instructions (Addendum)
 Please see below for information in regards to your upcoming surgery:   Planned surgery: replace pulse generator for deep brain stimulator ([employer])   Surgery date: 10/14/23 at [facility] (Medical Mall: [address] [place], [place] [address]) - you will find out your arrival time the business day before your surgery.   Pre-op appointment at [facility] Pre-admit Testing: you will receive a call with a date/time for this appointment. If you are scheduled for an in person appointment, Pre-admit Testing is located on the first floor of the [facility] building, [address], [address]. During this appointment, they will advise you which medications you can take the morning of surgery, and which medications you will need to hold for surgery. Labs (such as blood work, EKG) may be done at your pre-op appointment. You are not required to fast for these labs. Should you need to change your pre-op appointment, please call Pre-admit testing at [phone].     How to contact us:  If you have any questions/concerns before or after surgery, you can reach [place] at [phone], or you can send a mychart message. We can be reached by phone or mychart 8am-4pm, Monday-Friday.  *Please note: Calls after 4pm are forwarded to a third party answering service. Mychart messages are not routinely monitored during evenings, weekends, and holidays. Please call our office to contact the answering service for urgent concerns during non-business hours.    If you have FMLA/disability paperwork, please drop it off or fax it to [phone], attention Patty.   Appointments/FMLA & disability paperwork: Joycelyn Rua, & Flonnie Hailstone Registered Nurses/Surgery schedulers: Ilsa Iha Medical Assistants: Nash Mantis Physician Assistants: Joan Flores, PA-C, Manning Charity, PA-C & Drake Leach, PA-C Surgeons: Venetia Night, MD & Ernestine Mcmurray, MD   [facility] PREADMIT TESTING VISIT and SURGERY INFORMATION SHEET   Now that surgery has been scheduled you can anticipate several phone calls from [facility] services. A pharmacy technician will call you to verify your current list of medications taken at home.               The Pre-Service Center will call to verify your insurance information and to give you billing estimates and information.             The Preadmit Testing Office will be calling to schedule a visit to obtain information for the anesthesia team and provide instructions on preparation for surgery.  What can you expect for the Preadmit Testing Visit: Appointments may be scheduled in-person or by telephone.  If a telephone visit is scheduled, you may be asked to come into the office to have lab tests or other studies performed.   This visit will not be completed any greater than 14 days prior to your surgery.  If your surgery has been scheduled for a future date, please do not be alarmed if we have not contacted you to schedule an appointment more than a month prior to the surgery date.    Please be prepared to provide the following information during this appointment:            -Personal medical history                                               -Medication and allergy list            -  Any history of problems with anesthesia              -Recent lab work or diagnostic studies            -Please notify us of any needs we should be aware of to provide the best care possible           -You will be provided with instructions on how to prepare for your surgery.    On The Day of Surgery:  You must have a driver to take you home after surgery, you will be asked not to drive for 24 hours following surgery.  Taxi, Benedetto Goad and non-medical transport will not be acceptable means of transportation unless you have a responsible individual who will be traveling with you.  Visitors in the surgical area:   2 people will be able to visit you in  your room once your preparation for surgery has been completed. During surgery, your visitors will be asked to wait in the Surgery Waiting Area.  It is not a requirement for them to stay, if they prefer to leave and come back.  Your visitor(s) will be given an update once the surgery has been completed.  No visitors are allowed in the initial recovery room to respect patient privacy and safety.  Once you are more awake and transfer to the secondary recovery area, or are transferred to an inpatient room, visitors will again be able to see you.  To respect and protect your privacy: We will ask on the day of surgery who your driver will be and what the contact number for that individual will be. We will ask if it is okay to share information with this individual, or if there is an alternative individual that we, or the surgeon, should contact to provide updates and information. If family or friends come to the surgical information desk requesting information about you, who you have not listed with [place], no information will be given.   It may be helpful to designate someone as the main contact who will be responsible for updating your other friends and family.    PREADMIT TESTING OFFICE: [phone] SAME DAY SURGERY: [phone] We look forward to caring for you before and throughout the process of your surgery.

## 2023-10-08 NOTE — Patient Instructions (Addendum)
 Your procedure is scheduled on: Wednesday, April 16 Report to the Registration Desk on the 1st floor of the [facility] To find out your arrival time, please call [phone] between 1PM - 3PM on: Tuesday, April 15 If your arrival time is 6:00 am, do not arrive before that time as the [facility] entrance doors do not open until 6:00 am.  REMEMBER: Instructions that are not followed completely may result in serious medical risk, up to and including death; or upon the discretion of your surgeon and anesthesiologist your surgery may need to be rescheduled.  Do not eat food after midnight the night before surgery.  No gum chewing or hard candies.  You may however, drink CLEAR liquids up to 2 hours before you are scheduled to arrive for your surgery. Do not drink anything within 2 hours of your scheduled arrival time.  Clear liquids include: - water  - apple juice without pulp - gatorade (not RED colors) - black coffee or tea (Do NOT add milk or creamers to the coffee or tea) Do NOT drink anything that is not on this list.  One week prior to surgery: starting today, April 10 Stop Anti-inflammatories (NSAIDS) such as Advil, Aleve, Ibuprofen, Motrin, Naproxen, Naprosyn and Aspirin based products such as Excedrin, Goody's Powder, BC Powder. Stop ANY OVER THE COUNTER supplements until after surgery.  You may however, continue to take Tylenol if needed for pain up until the day of surgery.  Continue taking all of your other prescription medications up until the day of surgery.  ON THE DAY OF SURGERY ONLY TAKE THESE MEDICATIONS WITH SIPS OF WATER:  budesonide (PULMICORT) nebulizer Gabapentin Omeprazole (Prilosec) Sertraline (Zoloft)  Bring your albuterol inhaler to the hospital.  No Alcohol for 24 hours before or after surgery.  No Smoking including e-cigarettes for 24 hours before surgery.  No chewable tobacco products for at least 6 hours before surgery.  No nicotine patches on  the day of surgery.  Do not use any "recreational" drugs for at least a week (preferably 2 weeks) before your surgery.  Please be advised that the combination of cocaine and anesthesia may have negative outcomes, up to and including death. If you test positive for cocaine, your surgery will be cancelled.  On the morning of surgery brush your teeth with toothpaste and water, you may rinse your mouth with mouthwash if you wish. Do not swallow any toothpaste or mouthwash.  Use CHG Soap as directed on instruction sheet.  Do not wear jewelry, make-up, hairpins, clips or nail polish.  For welded (permanent) jewelry: bracelets, anklets, waist bands, etc.  Please have this removed prior to surgery.  If it is not removed, there is a chance that hospital personnel will need to cut it off on the day of surgery.  Do not wear lotions, powders, or perfumes.   Do not shave body hair from the neck down 48 hours before surgery.  Contact lenses, hearing aids and dentures may not be worn into surgery.  Do not bring valuables to the hospital. [facility] is not responsible for any missing/lost belongings or valuables.   Notify your doctor if there is any change in your medical condition (cold, fever, infection).  Wear comfortable clothing (specific to your surgery type) to the hospital.  After surgery, you can help prevent lung complications by doing breathing exercises.  Take deep breaths and cough every 1-2 hours.   If you are being discharged the day of surgery, you will not  be allowed to drive home. You will need a responsible individual to drive you home and stay with you for 24 hours after surgery.   If you are taking public transportation, you will need to have a responsible individual with you.  Please call the Pre-admissions Testing Dept. at [phone] if you have any questions about these instructions.  Surgery Visitation Policy:  Patients having surgery or a procedure may have two  visitors.  Children under the age of 26 must have an adult with them who is not the patient.      Preparing for Surgery with CHLORHEXIDINE GLUCONATE (CHG) Soap  Chlorhexidine Gluconate (CHG) Soap  o An antiseptic cleaner that kills germs and bonds with the skin to continue killing germs even after washing  o Used for showering the night before surgery and morning of surgery  Before surgery, you can play an important role by reducing the number of germs on your skin.  CHG (Chlorhexidine gluconate) soap is an antiseptic cleanser which kills germs and bonds with the skin to continue killing germs even after washing.  Please do not use if you have an allergy to CHG or antibacterial soaps. If your skin becomes reddened/irritated stop using the CHG.  1. Shower the NIGHT BEFORE SURGERY and the MORNING OF SURGERY with CHG soap.  2. If you choose to wash your hair, wash your hair first as usual with your normal shampoo.  3. After shampooing, rinse your hair and body thoroughly to remove the shampoo.  4. Use CHG as you would any other liquid soap. You can apply CHG directly to the skin and wash gently with a scrungie or a clean washcloth.  5. Apply the CHG soap to your body only from the neck down. Do not use on open wounds or open sores. Avoid contact with your eyes, ears, mouth, and genitals (private parts). Wash face and genitals (private parts) with your normal soap.  6. Wash thoroughly, paying special attention to the area where your surgery will be performed.  7. Thoroughly rinse your body with warm water.  8. Do not shower/wash with your normal soap after using and rinsing off the CHG soap.  9. Pat yourself dry with a clean towel.  10. Wear clean pajamas to bed the night before surgery.  12. Place clean sheets on your bed the night of your first shower and do not sleep with pets.  13. Shower again with the CHG soap on the day of surgery prior to arriving at the hospital.  14.  Do not apply any deodorants/lotions/powders.  15. Please wear clean clothes to the hospital.

## 2023-10-14 ENCOUNTER — Other Ambulatory Visit: Payer: Self-pay

## 2023-10-14 ENCOUNTER — Ambulatory Visit
Admission: AD | Admit: 2023-10-14 | Discharge: 2023-10-14 | Disposition: A | Discharge status: Home / Self Care | Source: Ambulatory Visit | Attending: Neurosurgery | Admitting: Neurosurgery

## 2023-10-14 ENCOUNTER — Ambulatory Visit: Payer: Self-pay | Admitting: Urgent Care

## 2023-10-14 ENCOUNTER — Encounter: Payer: Self-pay | Admitting: Neurosurgery

## 2023-10-14 ENCOUNTER — Encounter
Admission: AD | Disposition: A | Discharge status: Home / Self Care | Payer: Self-pay | Source: Ambulatory Visit | Attending: Neurosurgery

## 2023-10-14 ENCOUNTER — Ambulatory Visit

## 2023-10-14 DIAGNOSIS — M1990 Unspecified osteoarthritis, unspecified site: Secondary | ICD-10-CM | POA: Diagnosis not present

## 2023-10-14 DIAGNOSIS — Z4542 Encounter for adjustment and management of neuropacemaker (brain) (peripheral nerve) (spinal cord): Secondary | ICD-10-CM | POA: Diagnosis present

## 2023-10-14 DIAGNOSIS — Z01818 Encounter for other preprocedural examination: Secondary | ICD-10-CM

## 2023-10-14 DIAGNOSIS — I10 Essential (primary) hypertension: Secondary | ICD-10-CM | POA: Diagnosis not present

## 2023-10-14 DIAGNOSIS — J449 Chronic obstructive pulmonary disease, unspecified: Secondary | ICD-10-CM | POA: Insufficient documentation

## 2023-10-14 DIAGNOSIS — G250 Essential tremor: Secondary | ICD-10-CM | POA: Diagnosis not present

## 2023-10-14 HISTORY — PX: PULSE GENERATOR IMPLANT: SHX5370

## 2023-10-14 SURGERY — UNILATERAL PULSE GENERATOR IMPLANT
Anesthesia: General

## 2023-10-14 MED ORDER — VANCOMYCIN HCL IN DEXTROSE 1-5 GM/200ML-% IV SOLN
1000.0000 mg | Freq: Once | INTRAVENOUS | Status: AC
  Administered 2023-10-14: 1000 mg via INTRAVENOUS

## 2023-10-14 MED ORDER — VANCOMYCIN HCL IN DEXTROSE 1-5 GM/200ML-% IV SOLN
INTRAVENOUS | Status: AC
  Filled 2023-10-14: qty 200

## 2023-10-14 MED ORDER — OXYCODONE HCL 5 MG PO TABS
5.0000 mg | ORAL_TABLET | Freq: Four times a day (QID) | ORAL | 0 refills | Status: AC | PRN
  Filled 2023-10-14: qty 8, 2d supply, fill #0

## 2023-10-14 MED ORDER — SODIUM CHLORIDE (PF) 0.9 % IJ SOLN
INTRAMUSCULAR | Status: AC
  Filled 2023-10-14: qty 20

## 2023-10-14 MED ORDER — EPHEDRINE SULFATE-NACL 50-0.9 MG/10ML-% IV SOSY
PREFILLED_SYRINGE | INTRAVENOUS | Status: DC | PRN
  Administered 2023-10-14: 7.5 mg via INTRAVENOUS

## 2023-10-14 MED ORDER — CHLORHEXIDINE GLUCONATE 0.12 % MT SOLN
OROMUCOSAL | Status: AC
  Filled 2023-10-14: qty 15

## 2023-10-14 MED ORDER — OXYCODONE HCL 5 MG PO TABS: 5.0000 mg | ORAL_TABLET | Freq: Once | ORAL | Status: DC | PRN

## 2023-10-14 MED ORDER — BUPIVACAINE-EPINEPHRINE (PF) 0.5% -1:200000 IJ SOLN
INTRAMUSCULAR | Status: DC | PRN
  Administered 2023-10-14: 10 mL

## 2023-10-14 MED ORDER — CEFAZOLIN SODIUM-DEXTROSE 2-4 GM/100ML-% IV SOLN
INTRAVENOUS | Status: AC
  Filled 2023-10-14: qty 100

## 2023-10-14 MED ORDER — ONDANSETRON HCL 4 MG/2ML IJ SOLN
INTRAMUSCULAR | Status: DC | PRN
  Administered 2023-10-14: 4 mg via INTRAVENOUS

## 2023-10-14 MED ORDER — ONDANSETRON HCL 4 MG/2ML IJ SOLN: 4.0000 mg | Freq: Once | INTRAMUSCULAR | Status: DC | PRN

## 2023-10-14 MED ORDER — DEXAMETHASONE SODIUM PHOSPHATE 10 MG/ML IJ SOLN
INTRAMUSCULAR | Status: AC
  Filled 2023-10-14: qty 1

## 2023-10-14 MED ORDER — BUPIVACAINE-EPINEPHRINE (PF) 0.5% -1:200000 IJ SOLN
INTRAMUSCULAR | Status: AC
  Filled 2023-10-14: qty 20

## 2023-10-14 MED ORDER — 0.9 % SODIUM CHLORIDE (POUR BTL) OPTIME
TOPICAL | Status: DC | PRN
  Administered 2023-10-14: 500 mL

## 2023-10-14 MED ORDER — FENTANYL CITRATE (PF) 100 MCG/2ML IJ SOLN: 25.0000 ug | INTRAMUSCULAR | Status: DC | PRN

## 2023-10-14 MED ORDER — BUPIVACAINE LIPOSOME 1.3 % IJ SUSP
INTRAMUSCULAR | Status: AC
  Filled 2023-10-14: qty 20

## 2023-10-14 MED ORDER — PROPOFOL 500 MG/50ML IV EMUL
INTRAVENOUS | Status: DC | PRN
  Administered 2023-10-14: 125 ug/kg/min via INTRAVENOUS

## 2023-10-14 MED ORDER — PROPOFOL 1000 MG/100ML IV EMUL
INTRAVENOUS | Status: AC
  Filled 2023-10-14: qty 100

## 2023-10-14 MED ORDER — FENTANYL CITRATE (PF) 100 MCG/2ML IJ SOLN
INTRAMUSCULAR | Status: DC | PRN
  Administered 2023-10-14: 50 ug via INTRAVENOUS

## 2023-10-14 MED ORDER — ALBUMIN HUMAN 5 % IV SOLN
INTRAVENOUS | Status: AC
  Filled 2023-10-14: qty 500

## 2023-10-14 MED ORDER — LACTATED RINGERS IV SOLN: INTRAVENOUS | Status: DC

## 2023-10-14 MED ORDER — PHENYLEPHRINE 80 MCG/ML (10ML) SYRINGE FOR IV PUSH (FOR BLOOD PRESSURE SUPPORT)
PREFILLED_SYRINGE | INTRAVENOUS | Status: DC | PRN
  Administered 2023-10-14 (×2): 120 ug via INTRAVENOUS
  Administered 2023-10-14: 40 ug via INTRAVENOUS
  Administered 2023-10-14: 80 ug via INTRAVENOUS

## 2023-10-14 MED ORDER — ACETAMINOPHEN 10 MG/ML IV SOLN: 1000.0000 mg | Freq: Once | INTRAVENOUS | Status: DC | PRN

## 2023-10-14 MED ORDER — CHLORHEXIDINE GLUCONATE 0.12 % MT SOLN
15.0000 mL | Freq: Once | OROMUCOSAL | Status: AC
  Administered 2023-10-14: 15 mL via OROMUCOSAL

## 2023-10-14 MED ORDER — ORAL CARE MOUTH RINSE: 15.0000 mL | Freq: Once | OROMUCOSAL | Status: AC

## 2023-10-14 MED ORDER — VANCOMYCIN HCL IN DEXTROSE 1-5 GM/200ML-% IV SOLN
INTRAVENOUS | Status: AC
Start: 2023-10-14 — End: ?
  Filled 2023-10-14: qty 200

## 2023-10-14 MED ORDER — ONDANSETRON HCL 4 MG/2ML IJ SOLN
INTRAMUSCULAR | Status: AC
  Filled 2023-10-14: qty 2

## 2023-10-14 MED ORDER — DEXAMETHASONE SODIUM PHOSPHATE 4 MG/ML IJ SOLN
INTRAMUSCULAR | Status: DC | PRN
  Administered 2023-10-14: 10 mg via INTRAVENOUS

## 2023-10-14 MED ORDER — IRRISEPT - 450ML BOTTLE WITH 0.05% CHG IN STERILE WATER, USP 99.95% OPTIME
TOPICAL | Status: DC | PRN
  Administered 2023-10-14: 450 mL

## 2023-10-14 MED ORDER — FENTANYL CITRATE (PF) 100 MCG/2ML IJ SOLN
INTRAMUSCULAR | Status: AC
  Filled 2023-10-14: qty 2

## 2023-10-14 MED ORDER — BUPIVACAINE HCL (PF) 0.5 % IJ SOLN
INTRAMUSCULAR | Status: AC
  Filled 2023-10-14: qty 30

## 2023-10-14 MED ORDER — OXYCODONE HCL 5 MG/5ML PO SOLN: 5.0000 mg | Freq: Once | ORAL | Status: DC | PRN

## 2023-10-14 MED ORDER — CEFAZOLIN SODIUM-DEXTROSE 2-4 GM/100ML-% IV SOLN
2.0000 g | Freq: Once | INTRAVENOUS | Status: AC
  Administered 2023-10-14: 2 g via INTRAVENOUS

## 2023-10-14 SURGICAL SUPPLY — 29 items
DERMABOND ADVANCED .7 DNX12 (GAUZE/BANDAGES/DRESSINGS) ×2 IMPLANT
DRAPE C ARM PK CFD 31 SPINE (DRAPES) ×1 IMPLANT
DRAPE LAPAROTOMY 100X77 ABD (DRAPES) ×1 IMPLANT
DRSG OPSITE POSTOP 4X8 (GAUZE/BANDAGES/DRESSINGS) IMPLANT
ELECT REM PT RETURN 9FT ADLT (ELECTROSURGICAL) ×1 IMPLANT
ELECTRODE REM PT RTRN 9FT ADLT (ELECTROSURGICAL) ×1 IMPLANT
GLOVE BIOGEL PI IND STRL 6.5 (GLOVE) ×1 IMPLANT
GLOVE SURG SYN 6.5 ES PF (GLOVE) ×1 IMPLANT
GLOVE SURG SYN 6.5 PF PI (GLOVE) ×1 IMPLANT
GLOVE SURG SYN 8.5 E (GLOVE) ×3 IMPLANT
GLOVE SURG SYN 8.5 PF PI (GLOVE) ×3 IMPLANT
GOWN SRG LRG LVL 4 IMPRV REINF (GOWNS) ×1 IMPLANT
GOWN SRG XL LVL 3 NONREINFORCE (GOWNS) ×1 IMPLANT
HANDSET COMM PERCEPT RC TH91 (NEUROSURGERY SUPPLIES) IMPLANT
JET LAVAGE IRRISEPT WOUND (IRRIGATION / IRRIGATOR) ×1 IMPLANT
LAVAGE JET IRRISEPT WOUND (IRRIGATION / IRRIGATOR) ×1 IMPLANT
MANIFOLD NEPTUNE II (INSTRUMENTS) ×1 IMPLANT
MARKER SKIN DUAL TIP RULER LAB (MISCELLANEOUS) ×1 IMPLANT
NDL SAFETY ECLIPSE 18X1.5 (NEEDLE) ×1 IMPLANT
NEUROSTIM DBS PERCEPT PC (Neurostimulator) IMPLANT
NEUROSTIMULATOR DBS PERCEPT PC (Neurostimulator) ×1 IMPLANT
NS IRRIG 500ML POUR BTL (IV SOLUTION) ×1 IMPLANT
PACK LAMINECTOMY ARMC (PACKS) ×1 IMPLANT
SUT SILK 2 0SH CR/8 30 (SUTURE) ×1 IMPLANT
SUT STRATA 3-0 15 PS-2 (SUTURE) ×2 IMPLANT
SUT VIC AB 0 CT1 18XCR BRD 8 (SUTURE) ×1 IMPLANT
SUT VIC AB 2-0 CT1 18 (SUTURE) ×1 IMPLANT
SYR 10ML LL (SYRINGE) IMPLANT
TRAP FLUID SMOKE EVACUATOR (MISCELLANEOUS) ×1 IMPLANT

## 2023-10-14 NOTE — Progress Notes (Signed)
 Patient waiting for ride, attempted to call Jeannett Million three times for post operative instructions, and sent secure chat x2-patient designated contact, would not return call to same day surgery. Communicated with her on patient personal phone. Patient instructed not to drive today/ for 24 hrs. Friend picked up patient to take him home. Discharge packet sent home with patient/ instructions highlighted.  Oxycodone prescription was brought by inpatient pharmacy for home medication, placed in patient belonging bag and sent home with patient.

## 2023-10-14 NOTE — Anesthesia Preprocedure Evaluation (Addendum)
 Anesthesia Evaluation  Patient identified by MRN, date of birth, ID band Patient awake    Reviewed: Allergy & Precautions, NPO status , Patient's Chart, lab work & pertinent test results  History of Anesthesia Complications Negative for: history of anesthetic complications  Airway Mallampati: IV   Neck ROM: Full    Dental  (+) Edentulous Upper, Edentulous Lower   Pulmonary asthma , COPD   Pulmonary exam normal breath sounds clear to auscultation       Cardiovascular hypertension, Normal cardiovascular exam Rhythm:Regular Rate:Normal  ECG 08/12/23: NSR  Myocardial perfusion 02/26/22: Normal myocardial perfusion imaging. Normal wall motion and normal ejection fraction at rest and stress.   Neuro/Psych  Headaches PSYCHIATRIC DISORDERS Anxiety Depression    Essential tremor; chronic pain    GI/Hepatic negative GI ROS,,,  Endo/Other  negative endocrine ROS    Renal/GU negative Renal ROS     Musculoskeletal  (+) Arthritis ,    Abdominal   Peds  Hematology negative hematology ROS (+)   Anesthesia Other Findings   Reproductive/Obstetrics                             Anesthesia Physical Anesthesia Plan  ASA: 2  Anesthesia Plan: General   Post-op Pain Management:    Induction: Intravenous  PONV Risk Score and Plan: 2 and Propofol infusion, TIVA, Treatment may vary due to age or medical condition and Ondansetron  Airway Management Planned: Natural Airway  Additional Equipment:   Intra-op Plan:   Post-operative Plan:   Informed Consent: I have reviewed the patients History and Physical, chart, labs and discussed the procedure including the risks, benefits and alternatives for the proposed anesthesia with the patient or authorized representative who has indicated his/her understanding and acceptance.       Plan Discussed with: CRNA  Anesthesia Plan Comments: (LMA/GETA backup  discussed.  Patient consented for risks of anesthesia including but not limited to:  - adverse reactions to medications - damage to eyes, teeth, lips or other oral mucosa - nerve damage due to positioning  - sore throat or hoarseness - damage to heart, brain, nerves, lungs, other parts of body or loss of life  Informed patient about role of CRNA in peri- and intra-operative care.  Patient voiced understanding.)        Anesthesia Quick Evaluation

## 2023-10-14 NOTE — Anesthesia Procedure Notes (Signed)
 Procedure Name: MAC Date/Time: 10/14/2023 7:20 AM  Performed by: Mazzoni, Andrea, MDOxygen Delivery Method: Non-rebreather mask Induction Type: IV induction

## 2023-10-14 NOTE — Interval H&P Note (Signed)
 History and Physical Interval Note:  10/14/2023 6:58 AM  David Rhodes  has presented today for surgery, with the diagnosis of Z45.42 End of battery life of deep brain stimulator G25.0 Essential tremor.  The various methods of treatment have been discussed with the patient and family. After consideration of risks, benefits and other options for treatment, the patient has consented to  Procedure(s) with comments: UNILATERAL PULSE GENERATOR IMPLANT (N/A) - REPLACE PULSE GENERATOR FOR MEDTRONIC DEEP BRAIN STIMULATOR, LOCAL W/ MAC as a surgical intervention.  The patient's history has been reviewed, patient examined, no change in status, stable for surgery.  I have reviewed the patient's chart and labs.  Questions were answered to the patient's satisfaction.    Heart sounds normal no MRG. Chest Clear to Auscultation Bilaterally.   Jerri Hargadon

## 2023-10-14 NOTE — Op Note (Signed)
 Indications: the patient is a 68 yo male who was diagnosed with tremor.  He had a deep brain stimulator placed in the past.  He presented to the office with a battery at end-of-life.  He presents today for replacement of his battery.   Findings: successful placement of a internal pulse generator Preoperative Diagnosis: Z45.42 End of battery life of deep brain stimulator, G25.0 Essential tremor  Postoperative Diagnosis: same     EBL: 5 ml IVF: see anesthesia record Drains: none Disposition: Extubated and Stable to PACU Complications: none   No foley catheter was placed.     Preoperative Note:    Risks of surgery discussed in clinic.   Operative Note:      The patient was then brought from the preoperative center with intravenous access established.  He was placed under MAC.  The pulse generator was palpated and the incision marked on his abdomen.   The skin was then thoroughly cleansed.  Perioperative antibiotic prophylaxis was administered.  Sterile prep and drapes were then applied and a timeout was then observed.     Once this was complete an incision was opened with the use of a #10 blade knife over the internal pulse generator.  The pulse generator was exposed and removed from the pocket.  The wires were then disconnected.  Please note that this was 2 array system connected to 1 battery.  The wire was inspected to determine whether repositioning would be possible.  There was an area that was excessively scarred.  A decision was made not to reposition the pulse generator given the risk of possible damage to the wires.  The new pulse generator was then attached to the wires and positioned into the pocket.  It was copiously irrigated and then the pocket was closed in layers using Dermabond on the skin.    I performed the entire procedure with the assistance of Anastacio Karvonen PA as an Designer, television/film set. An assistant was required for this procedure due to the complexity.  The  assistant provided assistance in tissue manipulation and suction, and was required for the successful and safe performance of the procedure. I performed the critical portions of the procedure.      Jodeen Munch MD

## 2023-10-14 NOTE — Discharge Summary (Signed)
 Discharge Summary  Patient ID: David Rhodes MRN: [id number] DOB/AGE: Oct 08, 1955 68 y.o.  Admit date: 10/14/2023 Discharge date: 10/14/2023  Admission Diagnoses:  Z45.42 End of battery life of deep brain stimulator, G25.0 Essential tremor   Discharge Diagnoses:  Active Problems:   * No active hospital problems. *   Discharged Condition: good  Hospital Course:  David Rhodes is a 68 y.o with a history of tremor and DBS s/p battery replacement. Surgery was uncomplicated. He was monitored in PACU and discharged home after ambulating, urinating, and tolerating PO intake. He was given a small prescription for Oxycodone to take as needed for pain  Consults:  None  Significant Diagnostic Studies: NA  Treatments: surgery: see separately dictated operative report for further details  Discharge Exam: Blood pressure 112/74, pulse 70, temperature 97.6 F (36.4 C), resp. rate 17, height 6' (1.829 m), weight 65.3 kg, SpO2 100%. CN grossly intact MAEW Incision c/d/I with dressing in place  Disposition: Discharge disposition: 01-Home or Self Care        Allergies as of 10/14/2023       Reactions   Penicillins Shortness Of Breath   PATIENT HAS HAD A PCN REACTION WITH IMMEDIATE RASH, FACIAL/TONGUE/THROAT SWELLING, SOB, OR LIGHTHEADEDNESS WITH HYPOTENSION:  #  #  YES  #  #  Has patient had a PCN reaction causing severe rash involving mucus membranes or skin necrosis: No Has patient had a PCN reaction that required hospitalization No Has patient had a PCN reaction occurring within the last 10 years: No If all of the above answers are "NO", then may proceed with Cephalosporin use. **tolerated cefazolin and cephalexin 03/2017        Medication List     TAKE these medications    albuterol 108 (90 Base) MCG/ACT inhaler Commonly known as: VENTOLIN HFA Inhale 2 puffs into the lungs every 6 (six) hours as needed for wheezing or shortness of breath.   azithromycin 250 MG  tablet Commonly known as: Zithromax Z-Pak Take 2 tablets on day 1 then 1 tablet daily What changed:  when to take this reasons to take this   baclofen 10 MG tablet Commonly known as: LIORESAL TAKE 1 TABLET(10 MG) BY MOUTH THREE TIMES DAILY   budesonide 0.5 MG/2ML nebulizer solution Commonly known as: Pulmicort Take 2 mLs (0.5 mg total) by nebulization daily.   gabapentin 600 MG tablet Commonly known as: NEURONTIN TAKE 1 TABLET BY MOUTH EVERY MORNING, 1 TABLET AT NOON, 1 TABLET EVERY EVENING AND 1 TABLET EVERY NIGHT AT BEDTIME   loratadine 10 MG tablet Commonly known as: CLARITIN Take 1 tablet (10 mg total) by mouth daily.   losartan 50 MG tablet Commonly known as: Cozaar Take 1 tablet (50 mg total) by mouth daily. For high BP   meloxicam 15 MG tablet Commonly known as: MOBIC Take 1 tablet (15 mg total) by mouth daily.   montelukast 10 MG tablet Commonly known as: SINGULAIR Take 1 tablet (10 mg total) by mouth at bedtime. TAKE 1 TABLET(10 MG) BY MOUTH AT BEDTIME   omeprazole 20 MG capsule Commonly known as: PRILOSEC Take 1 capsule (20 mg total) by mouth daily.   oxyCODONE 5 MG immediate release tablet Commonly known as: Roxicodone Take 1 tablet (5 mg total) by mouth every 6 (six) hours as needed for up to 2 days for severe pain (pain score 7-10).   OXYGEN Inhale into the lungs. 3 L of oxygen   rOPINIRole 2 MG tablet Commonly known as: REQUIP  Take 1 tablet (2 mg total) by mouth 2 (two) times daily.   sertraline 50 MG tablet Commonly known as: ZOLOFT Take 1 tablet (50 mg total) by mouth daily.        Follow-up Information     Noble Bateman, PA Follow up on 10/27/2023.   Specialty: Neurosurgery Contact information: [address] [place] [address] [phone]                 Signed: Noble Bateman 10/14/2023, 8:13 AM

## 2023-10-14 NOTE — Anesthesia Postprocedure Evaluation (Signed)
 Anesthesia Post Note  Patient: David Rhodes  Procedure(s) Performed: UNILATERAL PULSE GENERATOR IMPLANT  Patient location during evaluation: PACU Anesthesia Type: General Level of consciousness: awake and alert, oriented and patient cooperative Pain management: pain level controlled Vital Signs Assessment: post-procedure vital signs reviewed and stable Respiratory status: spontaneous breathing, nonlabored ventilation and respiratory function stable Cardiovascular status: blood pressure returned to baseline and stable Postop Assessment: adequate PO intake Anesthetic complications: no   There were no known notable events for this encounter.   Last Vitals:  Vitals:   10/14/23 0845 10/14/23 0900  BP: 133/89 131/88  Pulse: 71 68  Resp: 18 13  Temp:  (!) 36.1 C  SpO2: 100% 100%    Last Pain:  Vitals:   10/14/23 0900  TempSrc:   PainSc: 0-No pain                 Elmus Mathes

## 2023-10-14 NOTE — Discharge Instructions (Addendum)
 NEUROSURGERY DISCHARGE INSTRUCTIONS  Admission diagnosis: End of battery life of deep brain stimulator [Z45.42] Essential tremor [G25.0]  Operative procedure: Replacement of DBS battery  What to do after you leave the hospital:  Recommended diet: regular diet. Increase protein intake to promote wound healing.  Recommended activity: activity as tolerated. No driving for 6 weeks.You should walk multiple times per day  Special Instructions  No straining, no heavy lifting > 10lbs x 4 weeks.  Keep incision area clean and dry. May shower in 2 days. No baths or pools for 6 weeks.  Please remove dressing tomorrow, no need to apply a bandage afterwards  You have no sutures to remove, the skin is closed with adhesive  Please take pain medications as directed. Take a stool softener if on pain medications   Please Report any of the following: Nausea or Vomiting, Temperature is greater than 101.6F (38.1C) degrees, Dizziness, Abdominal Pain, Difficulty Breathing or Shortness of Breath, Inability to Eat, drink Fluids, or Take medications, Bleeding, swelling, or drainage from surgical incision sites, New numbness or weakness, and Bowel or bladder dysfunction to the neurosurgeon on call. How to contact us :  If you have any questions/concerns before or after surgery, you can reach us  at [phone], or you can send a mychart message. We can be reached by phone or mychart 8am-4pm, Monday-Friday.  *Please note: Calls after 4pm are forwarded to a third party answering service. Mychart messages are not routinely monitored during evenings, weekends, and holidays. Please call our office to contact the answering service for urgent concerns during non-business hours.   Additional Follow up appointments Please follow up with Danielle Koch PA-C as scheduled in 2-3 weeks   Please see below for scheduled appointments:  Future Appointments  Date Time Provider Department Center  10/27/2023 11:30 AM Noble Bateman, PA [facility] None

## 2023-10-14 NOTE — Transfer of Care (Signed)
 Immediate Anesthesia Transfer of Care Note  Patient: David Rhodes  Procedure(s) Performed: UNILATERAL PULSE GENERATOR IMPLANT  Patient Location: PACU  Anesthesia Type:MAC  Level of Consciousness: sedated  Airway & Oxygen Therapy: Patient Spontanous Breathing and Patient connected to face mask oxygen  Post-op Assessment: Report given to RN and Post -op Vital signs reviewed and stable  Post vital signs: Reviewed and stable  Last Vitals:  Vitals Value Taken Time  BP    Temp    Pulse    Resp    SpO2      Last Pain:  Vitals:   10/14/23 0633  TempSrc: Temporal  PainSc: 0-No pain         Complications: There were no known notable events for this encounter.

## 2023-10-15 ENCOUNTER — Encounter: Payer: Self-pay | Admitting: Neurosurgery

## 2023-10-20 ENCOUNTER — Ambulatory Visit
Admission: EM | Admit: 2023-10-20 | Discharge: 2023-10-20 | Disposition: A | Discharge status: Home / Self Care | Attending: Physician Assistant | Admitting: Physician Assistant

## 2023-10-20 ENCOUNTER — Ambulatory Visit

## 2023-10-20 ENCOUNTER — Ambulatory Visit (INDEPENDENT_AMBULATORY_CARE_PROVIDER_SITE_OTHER)

## 2023-10-20 DIAGNOSIS — R0781 Pleurodynia: Secondary | ICD-10-CM

## 2023-10-20 DIAGNOSIS — W19XXXA Unspecified fall, initial encounter: Secondary | ICD-10-CM | POA: Diagnosis not present

## 2023-10-20 DIAGNOSIS — R0789 Other chest pain: Secondary | ICD-10-CM

## 2023-10-20 DIAGNOSIS — M79605 Pain in left leg: Secondary | ICD-10-CM | POA: Diagnosis not present

## 2023-10-20 DIAGNOSIS — I10 Essential (primary) hypertension: Secondary | ICD-10-CM

## 2023-10-20 NOTE — ED Triage Notes (Signed)
 Patient states that he tripped over his feet sat. Injured his left rib, left thigh.
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
## 2023-10-27 ENCOUNTER — Ambulatory Visit (INDEPENDENT_AMBULATORY_CARE_PROVIDER_SITE_OTHER): Admitting: Neurosurgery

## 2023-10-27 ENCOUNTER — Encounter: Payer: Self-pay | Admitting: Neurosurgery

## 2023-10-27 VITALS — BP 128/82 | Temp 98.0°F | Ht 72.0 in | Wt 144.0 lb

## 2023-10-27 DIAGNOSIS — Z09 Encounter for follow-up examination after completed treatment for conditions other than malignant neoplasm: Secondary | ICD-10-CM

## 2023-10-27 DIAGNOSIS — G250 Essential tremor: Secondary | ICD-10-CM

## 2023-10-27 NOTE — Progress Notes (Signed)
   REFERRING PHYSICIAN:  Sheron Dixons, Md [address] [place],  [place] [address]  DOS: 10/14/23 replacement of pulse generator for DBS  HISTORY OF PRESENT ILLNESS: MERLEN KOHLHOFF is about 2 weeks status post pulse generator for DBS. Overall, he is doing well postoperatively without any significant wound concerns.  He has had significant improvement in his tremor  PHYSICAL EXAMINATION:  NEUROLOGICAL:  General: In no acute distress.   Awake, alert, oriented to person, place, and time.  Pupils equal round and reactive to light.  Facial tone is symmetric.   Strength MAEW Incision: healing well   Imaging:  No interval imaging to review  Assessment / Plan: DOMINIQUE FORLENZA is doing well after recent DBS battery exchange. He can resume activities as tolerated.  I recommend he follow-up with neurology and have placed a referral to Dr. Ivette Marks Tat. I have also given the patient her office number.  We will see him going forward on an as-needed basis.   Advised to contact the office if any questions or concerns arise.   Anastacio Karvonen PA-C Dept of Neurosurgery

## 2023-10-28 ENCOUNTER — Emergency Department
Admission: EM | Admit: 2023-10-28 | Discharge: 2023-10-28 | Disposition: A | Discharge status: Home / Self Care | Attending: Emergency Medicine | Admitting: Emergency Medicine

## 2023-10-28 ENCOUNTER — Telehealth: Payer: Self-pay | Admitting: Neurosurgery

## 2023-10-28 ENCOUNTER — Other Ambulatory Visit: Payer: Self-pay

## 2023-10-28 DIAGNOSIS — X58XXXD Exposure to other specified factors, subsequent encounter: Secondary | ICD-10-CM | POA: Insufficient documentation

## 2023-10-28 DIAGNOSIS — S31101D Unspecified open wound of abdominal wall, left upper quadrant without penetration into peritoneal cavity, subsequent encounter: Secondary | ICD-10-CM | POA: Diagnosis present

## 2023-10-28 DIAGNOSIS — Z5189 Encounter for other specified aftercare: Secondary | ICD-10-CM

## 2023-10-28 DIAGNOSIS — I10 Essential (primary) hypertension: Secondary | ICD-10-CM | POA: Diagnosis not present

## 2023-10-28 DIAGNOSIS — J449 Chronic obstructive pulmonary disease, unspecified: Secondary | ICD-10-CM | POA: Diagnosis not present

## 2023-10-28 NOTE — Telephone Encounter (Signed)
  Media Information   replace pulse generator for DBS on 10/14/23  Patient went to the ER.

## 2023-10-28 NOTE — ED Triage Notes (Signed)
 Patient has incision on left upper quadrant of his abdomen that he is afraid may be dehiscing. Wound appears closed and healing, no drainage or redness noted. Patient denies any fever, nausea or vomiting.

## 2023-10-28 NOTE — ED Provider Notes (Signed)
 [facility] Provider Note    Event Date/Time   First MD Initiated Contact with Patient 10/28/23 [phone]     (approximate)   History   Wound Check   HPI  David Rhodes is a 68 y.o. male with a history of hypertension, COPD, chronic respiratory failure, and recent procedure on 4/16 for battery replacement of a deep brain stimulator in his left lower quadrant who presents with concern for wound dehiscence.  The patient states that the wound was checked yesterday and a follow-up visit but he feels like it is separating slightly.  He has not noted any redness, swelling, or or any pus or fluid drainage.  There has been no bleeding.  I reviewed the past medical records and confirmed the procedure on 4/16 and the follow-up note from neurosurgery from yesterday at which time the incision was noted to be healing well.   Physical Exam   Triage Vital Signs: ED Triage Vitals  Encounter Vitals Group     BP 10/28/23 0428 (!) 139/99     Systolic BP Percentile --      Diastolic BP Percentile --      Pulse Rate 10/28/23 0428 77     Resp 10/28/23 0428 18     Temp 10/28/23 0428 97.8 F (36.6 C)     Temp Source 10/28/23 0428 Oral     SpO2 10/28/23 0428 100 %     Weight 10/28/23 0425 145 lb (65.8 kg)     Height 10/28/23 0425 6' (1.829 m)     Head Circumference --      Peak Flow --      Pain Score 10/28/23 0425 0     Pain Loc --      Pain Education --      Exclude from Growth Chart --     Most recent vital signs: Vitals:   10/28/23 0428 10/28/23 0703  BP: (!) 139/99 (!) 150/100  Pulse: 77 70  Resp: 18 18  Temp: 97.8 F (36.6 C)   SpO2: 100% 100%    General: Awake, no distress.  CV:  Good peripheral perfusion.  Resp:  Normal effort.  Abd:  No distention.  Other:  Left lower quadrant incision with <58mm separation of the superficial epidermal layer.  Wound otherwise completely intact.  No erythema, induration, or abnormal warmth.  No tenderness.  No  significant dehiscence.  No drainage or purulence.   ED Results / Procedures / Treatments   Labs (all labs ordered are listed, but only abnormal results are displayed) Labs Reviewed - No data to display   EKG     RADIOLOGY    PROCEDURES:  Critical Care performed: No  Procedures   MEDICATIONS ORDERED IN ED: Medications - No data to display   IMPRESSION / MDM / ASSESSMENT AND PLAN / ED COURSE  I reviewed the triage vital signs and the nursing notes.                              Differential diagnosis includes, but is not limited to, postoperative wound.  On my exam there is no evidence of any clinically significant dehiscence.  There is no evidence of infection.  There is no indication for lab workup.  I discussed the case with Dr. Felipe Horton from neurosurgery.  The patient is reassured.  He is stable for discharge home at this time.  Return precautions given, and he expresses  understanding.  Patient's presentation is most consistent with acute, uncomplicated illness.     FINAL CLINICAL IMPRESSION(S) / ED DIAGNOSES   Final diagnoses:  Visit for wound check     Rx / DC Orders   ED Discharge Orders     None        Note:  This document was prepared using Dragon voice recognition software and may include unintentional dictation errors.    Lind Repine, MD 10/28/23 [phone]

## 2023-10-28 NOTE — Discharge Instructions (Signed)
 Follow-up with the neurosurgeon as needed.  Return to the ER for new, worsening, or persistent severe opening of the wound, any bleeding, pus, or other fluid drainage, redness or swelling around the wound, fever, or any other new or worsening symptoms that concern you.

## 2023-10-28 NOTE — Telephone Encounter (Signed)
 Patient seen in the ER for wound concerns. Dr. Felipe Horton was contacted and patient discharged since his wound is well healing with no concerns for infection. Will remain available to the patient in future for upcoming concerns.

## 2023-10-29 ENCOUNTER — Encounter: Payer: Self-pay | Admitting: Neurology

## 2023-11-13 NOTE — Progress Notes (Signed)
 Assessment/Plan:   1.  Essential Tremor.  - Status post bilateral VIM DBS in June, 2008 with Dr. Nigel Bart.  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  This was removed and IPG is now on the left abdomen.  Last IPG change was October 14, 2023  - Patient has had speech change with the left brain lead.  This has been reprogrammed multiple times, without significant improvement.  - Poor compliance with follow-up is a big issue.  David Rhodes and I had a long discussion about this today.  We talked about the reasons for needing follow-up.  David Rhodes has had significant detrimental issues from not following up in the past.  David Rhodes has run out of battery for years at a time.  His IPG eroded through the chest years ago and David Rhodes left it that way for several months without letting anybody know.  We talked about the fact that David Rhodes needed to be seen at least once a year and David Rhodes said David Rhodes would be agreeable to that. 2.  Restless leg  - Is on high dose gabapentin   - on ropinirole  2 mg twice per day.  Discussed with patient that this medication is no longer recommended by the American Academy of sleep medicine for restless leg because of augmentation.  David Rhodes is on quite a large dose and I would recommend that this medication be tapered.  Subjective:   David Rhodes was seen in consultation in the movement disorder clinic at the request of Noble Bateman, PA.  The evaluation is for tremor.  I have seen the patient many times over the years.  David Rhodes is a 68 year old male with a history of essential tremor, status post deep brain stimulation surgery in June, 2008.  Patient has a long history of noncompliance with follow-up and complications with wound issues.  Patient had to have IPG removed in October, 2020.  David Rhodes had a draining chest wound from the IPG that had been draining for 4 months, without knowledge of medical professionals.  David Rhodes presented to the emergency room around that time with an open wound on the chest.   His IPG was removed, but the other components were left in place.  Ultimately, the IPG was replaced and is now in the left abdomen.  Patient's DBS has been complicated by speech change with the left brain lead.  I last saw the patient in 2021.  I received a call from Dr. Julane Ny that the patient showed up in his office in November, 2023 needing a battery change.  Dr. Julane Ny wanted the patient to follow-up here afterward, but the patient never made a follow-up appointment. Pt states that Dr. Julane Ny never actually replaced the battery as David Rhodes had phlegm and wheezing the day of the surgery and was sent home.  The battery has been depleted ever since (for a few years). The patient subsequently was referred to Dr. Jeris Montes and had his battery replaced October 14, 2023.  David Rhodes had been shaking a lot and is much better now.  David Rhodes is able to clip his nails now, except David Rhodes had some trouble with the L foot.  "It was good to be able to comb my hair."     Allergies  Allergen Reactions   Penicillins Shortness Of Breath    PATIENT HAS HAD A PCN REACTION WITH IMMEDIATE RASH, FACIAL/TONGUE/THROAT SWELLING, SOB, OR LIGHTHEADEDNESS WITH HYPOTENSION:  #  #  YES  #  #  Has patient had a PCN  reaction causing severe rash involving mucus membranes or skin necrosis: No Has patient had a PCN reaction that required hospitalization No Has patient had a PCN reaction occurring within the last 10 years: No If all of the above answers are "NO", then may proceed with Cephalosporin use. **tolerated cefazolin  and cephalexin  03/2017    Current Meds  Medication Sig   albuterol  (VENTOLIN  HFA) 108 (90 Base) MCG/ACT inhaler Inhale 2 puffs into the lungs every 6 (six) hours as needed for wheezing or shortness of breath.   baclofen  (LIORESAL ) 10 MG tablet TAKE 1 TABLET(10 MG) BY MOUTH THREE TIMES DAILY   budesonide  (PULMICORT ) 0.5 MG/2ML nebulizer solution Take 2 mLs (0.5 mg total) by nebulization daily.   gabapentin  (NEURONTIN ) 600 MG tablet  TAKE 1 TABLET BY MOUTH EVERY MORNING, 1 TABLET AT NOON, 1 TABLET EVERY EVENING AND 1 TABLET EVERY NIGHT AT BEDTIME   loratadine  (CLARITIN ) 10 MG tablet Take 1 tablet (10 mg total) by mouth daily.   losartan  (COZAAR ) 50 MG tablet Take 1 tablet (50 mg total) by mouth daily. For high BP   meloxicam  (MOBIC ) 15 MG tablet Take 1 tablet (15 mg total) by mouth daily.   montelukast  (SINGULAIR ) 10 MG tablet Take 1 tablet (10 mg total) by mouth at bedtime. TAKE 1 TABLET(10 MG) BY MOUTH AT BEDTIME   omeprazole  (PRILOSEC) 20 MG capsule Take 1 capsule (20 mg total) by mouth daily.   OXYGEN  Inhale into the lungs. 4 L of oxygen    rOPINIRole  (REQUIP ) 2 MG tablet Take 1 tablet (2 mg total) by mouth 2 (two) times daily.   sertraline  (ZOLOFT ) 50 MG tablet Take 1 tablet (50 mg total) by mouth daily.      Objective:   VITALS:   Vitals:   11/16/23 1444  BP: 130/80  Pulse: 91  SpO2: 98%  Weight: 144 lb (65.3 kg)  Height: 6' (1.829 m)   Gen:  Appears stated age and in NAD. HEENT:  Normocephalic, atraumatic. The mucous membranes are moist. The superficial temporal arteries are without ropiness or tenderness. Cardiovascular: Regular rate and rhythm. Lungs: Clear to auscultation bilaterally. Neck: There are no carotid bruits noted bilaterally.  NEUROLOGICAL:  Orientation:  The patient is alert and oriented x 3.   Cranial nerves: There is good facial symmetry. Extraocular muscles are intact and visual fields are full to confrontational testing. Speech is fluent and mildly dysarthric. Soft palate rises symmetrically and there is no tongue deviation. Hearing is intact to conversational tone. Tone: Tone is good throughout. Sensation: Sensation is intact to light touch touch throughout (facial, trunk, extremities).  Coordination:  The patient has no dysdiadichokinesia or dysmetria. Motor: Strength is 5/5 in the bilateral upper and lower extremities.  Shoulder shrug is equal bilaterally.  There is no pronator  drift.  There are no fasciculations noted. Gait and Station: David Rhodes is initially slightly off balance when David Rhodes gets out of the chair.  The patient ambulates well in the hall with his oxygen , however.    MOVEMENT EXAM: Tremor:  There is no rest tremor.  There is no postural tremor.  There is very little intention tremor on either side (and this was really corrected post programming).  I have reviewed and interpreted the following labs independently   Chemistry      Component Value Date/Time   NA 142 08/12/2023 0154   NA 142 04/25/2022 0928   NA 138 06/26/2012 1812   K 3.7 08/12/2023 0154   K 4.0 06/26/2012 1812  CL 109 08/12/2023 0154   CL 106 06/26/2012 1812   CO2 22 08/12/2023 0154   CO2 24 06/26/2012 1812   BUN 16 08/12/2023 0154   BUN 16 04/25/2022 0928   BUN 13 06/26/2012 1812   CREATININE 0.84 08/12/2023 0154   CREATININE 0.76 04/16/2018 1206      Component Value Date/Time   CALCIUM 10.2 08/12/2023 0154   CALCIUM 9.4 06/26/2012 1812   ALKPHOS 115 04/25/2022 0928   ALKPHOS 84 07/20/2011 0208   AST 23 04/25/2022 0928   AST 18 07/20/2011 0208   ALT 19 04/25/2022 0928   ALT 22 07/20/2011 0208   BILITOT 0.4 04/25/2022 0928   BILITOT 0.3 07/20/2011 0208      Lab Results  Component Value Date   WBC 10.2 08/12/2023   HGB 14.4 08/12/2023   HCT 43.1 08/12/2023   MCV 87.8 08/12/2023   PLT 177 08/12/2023   Lab Results  Component Value Date   TSH 2.890 04/25/2022      Total time spent on today's visit was 45 minutes, including both face-to-face time and nonface-to-face time.  Time included that spent on review of records (prior notes available to me/labs/imaging if pertinent), discussing treatment and goals, answering patient's questions and coordinating care.  This did not include the DBS time.  CC:  Sheron Dixons, MD

## 2023-11-16 ENCOUNTER — Encounter: Payer: Self-pay | Admitting: Neurology

## 2023-11-16 ENCOUNTER — Ambulatory Visit (INDEPENDENT_AMBULATORY_CARE_PROVIDER_SITE_OTHER): Admitting: Neurology

## 2023-11-16 VITALS — BP 130/80 | HR 91 | Ht 72.0 in | Wt 144.0 lb

## 2023-11-16 DIAGNOSIS — G2581 Restless legs syndrome: Secondary | ICD-10-CM

## 2023-11-16 DIAGNOSIS — G250 Essential tremor: Secondary | ICD-10-CM

## 2023-11-16 NOTE — Procedures (Signed)
 DBS Programming was performed.    Manufacturer of DBS device: Medtronic  Total time spent programming was 10 minutes.  Device was confirmed to be on.  Soft start was confirmed to be on.  Impedences were checked and were within normal limits.  Battery was checked and was determined to be functioning normally and not near the end of life.  IPG is in the L abdomen.   Final settings were as follows:   Active Contact Amplitude (mA) PW (ms) Frequency (hz) Side Effects Battery  Left Brain        11/16/23 02-05-09-C+ 1.4 90 160  90yrs, 4 months                          Right Brain        11/16/23 2-C+ 3.2 60 160

## 2024-02-02 ENCOUNTER — Telehealth: Payer: Self-pay | Admitting: Internal Medicine

## 2024-02-02 NOTE — Telephone Encounter (Signed)
 Received message from fax stating that patient was Wheezing and wanted an appointment today. Tried calling pt back. Left VM stating to call back to schedule appt. And Dr. Justus is out of the office but he can see another provider. fyi

## 2024-02-09 ENCOUNTER — Other Ambulatory Visit: Payer: Self-pay | Admitting: Internal Medicine

## 2024-02-09 ENCOUNTER — Ambulatory Visit: Payer: Self-pay

## 2024-02-09 DIAGNOSIS — J4550 Severe persistent asthma, uncomplicated: Secondary | ICD-10-CM

## 2024-02-09 MED ORDER — ALBUTEROL SULFATE HFA 108 (90 BASE) MCG/ACT IN AERS: 2.0000 | INHALATION_SPRAY | Freq: Four times a day (QID) | RESPIRATORY_TRACT | 1 refills | Status: AC | PRN

## 2024-02-09 NOTE — Progress Notes (Unsigned)
 Date:  02/09/2024   Name:  David Rhodes   DOB:  21-Mar-1956   MRN:  [id number]   Chief Complaint: No chief complaint on file.  HPI  Review of Systems   Lab Results  Component Value Date   NA 142 08/12/2023   K 3.7 08/12/2023   CO2 22 08/12/2023   GLUCOSE 110 (H) 08/12/2023   BUN 16 08/12/2023   CREATININE 0.84 08/12/2023   CALCIUM 10.2 08/12/2023   EGFR 83 04/25/2022   GFRNONAA >60 08/12/2023   Lab Results  Component Value Date   CHOL 156 04/25/2022   HDL 63 04/25/2022   LDLCALC 82 04/25/2022   TRIG 51 04/25/2022   CHOLHDL 2.5 04/25/2022   Lab Results  Component Value Date   TSH 2.890 04/25/2022   Lab Results  Component Value Date   HGBA1C 5.3 12/25/2018   Lab Results  Component Value Date   WBC 10.2 08/12/2023   HGB 14.4 08/12/2023   HCT 43.1 08/12/2023   MCV 87.8 08/12/2023   PLT 177 08/12/2023   Lab Results  Component Value Date   ALT 19 04/25/2022   AST 23 04/25/2022   ALKPHOS 115 04/25/2022   BILITOT 0.4 04/25/2022   No results found for: MARIEN BOLLS, VD25OH   Patient Active Problem List   Diagnosis Date Noted   End of battery life of deep brain stimulator 10/14/2023   Current moderate episode of major depressive disorder without prior episode (HCC) 02/26/2021   Primary osteoarthritis of first carpometacarpal joint of left hand 02/26/2021   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   Dependence on supplemental oxygen  04/18/2020   Leukocytosis 04/15/2019   Inguinal hernia recurrent bilateral 11/16/2018   Chronic left shoulder pain 10/05/2018   PICC (peripherally inserted central catheter) in place 04/16/2018   Essential tremor 10/18/2017   Cervical radiculopathy 07/23/2017   Bilateral temporomandibular joint pain 07/23/2017   Neck pain 05/19/2017   Medication monitoring encounter 09/11/2016   Pulmonary nodules 12/31/2015   COPD (chronic obstructive pulmonary disease) (HCC) 12/27/2015   Chronic cough  12/27/2015   Asthma 09/24/2015   Allergic rhinitis 11/11/2014   Nerve root pain 11/11/2014   Dyskinesia 11/11/2014   GERD (gastroesophageal reflux disease) 11/11/2014   Calcium blood increased 11/11/2014   Affective disorder (HCC) 11/11/2014   Essential hypertension 12/25/2011   Chronic back pain 12/25/2011    Allergies  Allergen Reactions   Penicillins Shortness Of Breath    PATIENT HAS HAD A PCN REACTION WITH IMMEDIATE RASH, FACIAL/TONGUE/THROAT SWELLING, SOB, OR LIGHTHEADEDNESS WITH HYPOTENSION:  #  #  YES  #  #  Has patient had a PCN reaction causing severe rash involving mucus membranes or skin necrosis: No Has patient had a PCN reaction that required hospitalization No Has patient had a PCN reaction occurring within the last 10 years: No If all of the above answers are NO, then may proceed with Cephalosporin use. **tolerated cefazolin  and cephalexin  03/2017    Past Surgical History:  Procedure Laterality Date   DEEP BRAIN STIMULATOR PLACEMENT  12/03/2006   Dr. Unice [facility], [place])   INGUINAL HERNIA REPAIR Bilateral 02/07/2014   Procedure:  BILATERAL INGUINAL HERNIA REPAIR;  Surgeon: Lynda Leos, MD;  Location: [facility] OR;  Service: General;  Laterality: Bilateral;   INGUINAL HERNIA REPAIR Right 07/05/2021   INSERTION OF MESH Bilateral 02/07/2014   Procedure: INSERTION OF MESH;  Surgeon: Lynda Leos, MD;  Location: [facility] OR;  Service: General;  Laterality: Bilateral;  NASAL SINUS SURGERY     PULSE GENERATOR IMPLANT Left 09/11/2016   Procedure: Left chest-Change implantable pulse generator battery;  Surgeon: Fairy Levels, MD;  Location: [facility] OR;  Service: Neurosurgery;  Laterality: Left;  Left chest-Change implantable pulse generator battery   PULSE GENERATOR IMPLANT Left 04/06/2018   Procedure: Revision of left chest implantable pulse generator, extension, and pocket adapter;  Surgeon: Levels Fairy, MD;  Location: [facility] OR;  Service: Neurosurgery;  Laterality: Left;   PULSE  GENERATOR IMPLANT N/A 10/14/2023   Procedure: UNILATERAL PULSE GENERATOR IMPLANT;  Surgeon: Clois Fret, MD;  Location: [facility] ORS;  Service: Neurosurgery;  Laterality: N/A;  REPLACE PULSE GENERATOR FOR MEDTRONIC DEEP BRAIN STIMULATOR, LOCAL W/ MAC   SUBTHALAMIC STIMULATOR BATTERY REPLACEMENT  12/09/2011   Procedure: SUBTHALAMIC STIMULATOR BATTERY REPLACEMENT;  Surgeon: Fairy Levels, MD;  Location: [facility] ORS;  Service: Neurosurgery;  Laterality: N/A;   deep brain stimulator, implantable pulse generator change   SUBTHALAMIC STIMULATOR BATTERY REPLACEMENT Left 04/21/2014   Procedure: Deep brain stimulator battery change;  Surgeon: Fairy Levels, MD;  Location: [facility]UR[facility] ORS;  Service: Neurosurgery;  Laterality: Left;  Deep brain stimulator battery change   SUBTHALAMIC STIMULATOR BATTERY REPLACEMENT N/A 08/10/2018   Procedure: Replacement of deep brain stimulator pulse generator/left abdomen with long lead extensions;  Surgeon: Levels Fairy, MD;  Location: [facility] OR;  Service: Neurosurgery;  Laterality: N/A;    Social History   Tobacco Use   Smoking status: Never    Passive exposure: Past   Smokeless tobacco: Current    Types: Chew   Tobacco comments:    Trying to taper off.  Too expensive  Vaping Use   Vaping status: Never Used  Substance Use Topics   Alcohol use: No   Drug use: No     Medication list has been reviewed and updated.  No outpatient medications have been marked as taking for the 02/09/24 encounter (Orders Only) with Justus Leita DEL, MD.       04/25/2022    8:44 AM 10/21/2021    8:28 AM 04/22/2021    9:23 AM 02/26/2021   10:19 AM  GAD 7 : Generalized Anxiety Score  Nervous, Anxious, on Edge 3 0 0 0  Control/stop worrying 0 0 0 0  Worry too much - different things 0 0 0 0  Trouble relaxing 2 0 1 1  Restless 3 0 0 0  Easily annoyed or irritable 0 0 0 1  Afraid - awful might happen 0 0 0 0  Total GAD 7 Score 8 0 1 2  Anxiety Difficulty Extremely difficult  Not  difficult at all        04/30/2022   10:21 AM 04/25/2022    8:42 AM 10/21/2021    8:28 AM  Depression screen PHQ 2/9  Decreased Interest 2 2 0  Down, Depressed, Hopeless 2 1 2   PHQ - 2 Score 4 3 2   Altered sleeping 1 1 1   Tired, decreased energy 3 2 2   Change in appetite 1 1 3   Feeling bad or failure about yourself  2 2 0  Trouble concentrating 1 1 0  Moving slowly or fidgety/restless 3 3 0  Suicidal thoughts 0 0 0  PHQ-9 Score 15 13 8   Difficult doing work/chores Very difficult Very difficult Not difficult at all    BP Readings from Last 3 Encounters:  11/16/23 130/80  10/28/23 (!) 150/100  10/27/23 128/82    Physical Exam  Wt Readings from Last 3 Encounters:  11/16/23 144 lb (65.3 kg)  10/28/23 145 lb (65.8 kg)  10/27/23 144 lb (65.3 kg)    There were no vitals taken for this visit.  Assessment and Plan:  Problem List Items Addressed This Visit   None   No follow-ups on file.    Leita HILARIO Adie, MD [facility] Primary Care and Sports Medicine [place]

## 2024-02-09 NOTE — Telephone Encounter (Signed)
 Action required by provider: update on patient condition.  Patient was last seen in primary care on 09/23/2023 by Justus Leita DEL, MD.  Called Nurse Triage reporting Shortness of Breath.  Symptoms began Patient would not specify.  Interventions attempted: Nothing.  Symptoms are: rapidly worsening.  Triage Disposition: No disposition on file.  Patient/caregiver understands and will follow disposition?:  Answer Assessment - Initial Assessment Questions This RN provided rationales for recommendation to call 911. Patient refused 911 recommendation x multiple attempts. Also refused ED recommendation x multiple attempts. This RN contacted [facility] office and spoke with Nurse Roetta at PCP office to relay concerns. Patient discontinued prior to transfer. Jessenia requested this RN send CRM while she discusses with provider and that she will then attempt to contact the patient.   1. RESPIRATORY STATUS: Describe your breathing? (e.g., wheezing, shortness of breath, unable to speak, severe coughing)      Unable to speak in full sentences, hx of COPD, patient is not on oxygen  - states batteries need to charge. Patient states he needs albuterol  and O2 ordered..  2. ONSET: When did this breathing problem begin?      Patient unable to specify  3. PATTERN Does the difficult breathing come and go, or has it been constant since it started?      Constant  4. SEVERITY: How bad is your breathing? (e.g., mild, moderate, severe)      More severe than baseline  7. LUNG HISTORY: Do you have any history of lung disease?  (e.g., pulmonary embolus, asthma, emphysema)     COPD  10. O2 SATURATION MONITOR:  Do you use an oxygen  saturation monitor (pulse oximeter) at home? If Yes, ask: What is your reading (oxygen  level) today? What is your usual oxygen  saturation reading? (e.g., 95%)       States he does not have one  Protocols used: Breathing Difficulty-A-AH Copied from CRM [id number]. Topic:  Clinical - Red Word Triage >> Feb 09, 2024 11:25 AM [name] wrote: Shortness of breath

## 2024-02-09 NOTE — Telephone Encounter (Signed)
 LMOM for patient to go by Pharmacy for his inhaler.  JM

## 2024-02-13 ENCOUNTER — Emergency Department
Admission: EM | Admit: 2024-02-13 | Discharge: 2024-02-13 | Discharge status: Against Medical Advice | Attending: Emergency Medicine | Admitting: Emergency Medicine

## 2024-02-13 ENCOUNTER — Emergency Department

## 2024-02-13 DIAGNOSIS — R0602 Shortness of breath: Secondary | ICD-10-CM | POA: Insufficient documentation

## 2024-02-13 DIAGNOSIS — R109 Unspecified abdominal pain: Secondary | ICD-10-CM | POA: Diagnosis not present

## 2024-02-13 DIAGNOSIS — Z5321 Procedure and treatment not carried out due to patient leaving prior to being seen by health care provider: Secondary | ICD-10-CM | POA: Insufficient documentation

## 2024-02-13 DIAGNOSIS — R5383 Other fatigue: Secondary | ICD-10-CM | POA: Insufficient documentation

## 2024-02-13 DIAGNOSIS — R059 Cough, unspecified: Secondary | ICD-10-CM | POA: Insufficient documentation

## 2024-02-13 DIAGNOSIS — R531 Weakness: Secondary | ICD-10-CM | POA: Insufficient documentation

## 2024-02-13 DIAGNOSIS — R918 Other nonspecific abnormal finding of lung field: Secondary | ICD-10-CM | POA: Diagnosis not present

## 2024-02-13 DIAGNOSIS — R079 Chest pain, unspecified: Secondary | ICD-10-CM | POA: Insufficient documentation

## 2024-02-13 LAB — CBC
HCT: 44.7 % (ref 39.0–52.0)
Hemoglobin: 15 g/dL (ref 13.0–17.0)
MCH: 29.4 pg (ref 26.0–34.0)
MCHC: 33.6 g/dL (ref 30.0–36.0)
MCV: 87.6 fL (ref 80.0–100.0)
Platelets: 217 K/uL (ref 150–400)
RBC: 5.1 MIL/uL (ref 4.22–5.81)
RDW: 12.7 % (ref 11.5–15.5)
WBC: 10.5 K/uL (ref 4.0–10.5)
nRBC: 0 % (ref 0.0–0.2)

## 2024-02-13 LAB — BASIC METABOLIC PANEL WITH GFR
Anion gap: 6 (ref 5–15)
BUN: 13 mg/dL (ref 8–23)
CO2: 24 mmol/L (ref 22–32)
Calcium: 10.3 mg/dL (ref 8.9–10.3)
Chloride: 108 mmol/L (ref 98–111)
Creatinine, Ser: 0.88 mg/dL (ref 0.61–1.24)
GFR, Estimated: >60 mL/min (ref >60)
Glucose, Bld: 88 mg/dL (ref 70–99)
Potassium: 3.7 mmol/L (ref 3.5–5.1)
Sodium: 138 mmol/L (ref 135–145)

## 2024-02-13 LAB — RESP PANEL BY RT-PCR (RSV, FLU A&B, COVID)  RVPGX2
Influenza A by PCR: NEGATIVE
Influenza B by PCR: NEGATIVE
Resp Syncytial Virus by PCR: NEGATIVE
SARS Coronavirus 2 by RT PCR: NEGATIVE

## 2024-02-13 NOTE — ED Triage Notes (Signed)
 Pt. Reports SOB x 3 days, with weakness, cough, fatigue, chest and abdominal pain from coughing

## 2024-02-13 NOTE — ED Notes (Signed)
 Pt to desk and advised he was leaving. Pt is very SOB. Pt was encouraged to stay. Pt on home oxygen . IV removed and pt left AMS. Educated to call 911 and come back if worsens.

## 2024-02-15 ENCOUNTER — Telehealth: Payer: Self-pay

## 2024-02-15 NOTE — Transitions of Care (Post Inpatient/ED Visit) (Unsigned)
   02/15/2024  Name: David Rhodes MRN: [id number] DOB: 1956-04-02  Today's TOC FU Call Status: Today's TOC FU Call Status:: Unsuccessful Call (1st Attempt) Unsuccessful Call (1st Attempt) Date: 02/15/24  Attempted to reach the patient regarding the most recent Inpatient/ED visit.  Follow Up Plan: Additional outreach attempts will be made to reach the patient to complete the Transitions of Care (Post Inpatient/ED visit) call.   Jaivian Battaglini [facility]  Primary Care & Sports Medicine [facility] CMA, AAMA [address]  [place] [address] Office [phone]  Fax: [phone]

## 2024-02-17 NOTE — Transitions of Care (Post Inpatient/ED Visit) (Unsigned)
   02/17/2024  Name: David Rhodes MRN: [id number] DOB: February 16, 1956  Today's TOC FU Call Status: Today's TOC FU Call Status:: Unsuccessful Call (2nd Attempt) Unsuccessful Call (1st Attempt) Date: 02/15/24 Unsuccessful Call (2nd Attempt) Date: 02/17/24  Attempted to reach the patient regarding the most recent Inpatient/ED visit.  Follow Up Plan: Additional outreach attempts will be made to reach the patient to complete the Transitions of Care (Post Inpatient/ED visit) call.   Jett Fukuda [facility]  Primary Care & Sports Medicine [facility] CMA, AAMA [address]  [place] [address] Office [phone]  Fax: [phone]

## 2024-02-18 ENCOUNTER — Telehealth: Payer: Self-pay | Admitting: Neurology

## 2024-02-18 ENCOUNTER — Ambulatory Visit
Admission: EM | Admit: 2024-02-18 | Discharge: 2024-02-18 | Disposition: A | Discharge status: Home / Self Care | Attending: Emergency Medicine | Admitting: Emergency Medicine

## 2024-02-18 DIAGNOSIS — J441 Chronic obstructive pulmonary disease with (acute) exacerbation: Secondary | ICD-10-CM | POA: Diagnosis not present

## 2024-02-18 MED ORDER — PREDNISONE 50 MG PO TABS: 50.0000 mg | ORAL_TABLET | Freq: Every day | ORAL | 0 refills | Status: AC

## 2024-02-18 MED ORDER — IPRATROPIUM-ALBUTEROL 0.5-2.5 (3) MG/3ML IN SOLN
3.0000 mL | Freq: Once | RESPIRATORY_TRACT | Status: AC
  Administered 2024-02-18: 3 mL via RESPIRATORY_TRACT

## 2024-02-18 MED ORDER — DEXAMETHASONE SODIUM PHOSPHATE 10 MG/ML IJ SOLN
10.0000 mg | Freq: Once | INTRAMUSCULAR | Status: AC
  Administered 2024-02-18: 10 mg via INTRAMUSCULAR

## 2024-02-18 MED ORDER — LEVOFLOXACIN 750 MG PO TABS: 750.0000 mg | ORAL_TABLET | Freq: Every day | ORAL | 0 refills | Status: AC

## 2024-02-18 NOTE — Telephone Encounter (Signed)
 Pt would like appt to have DBS configured

## 2024-02-18 NOTE — Discharge Instructions (Addendum)
 Please go to Er for further evaluation of COPD flare(scripted Levaquin  and prednisone  burst, pt refusing to go to ER) Avoid smoke/allergen exposure Continue home meds as previously prescribed Push fluids,avoid caffeine
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
## 2024-02-18 NOTE — ED Triage Notes (Signed)
 Pt states he has been having SOB, wheezing, cough, pain in chest pt states from cough x 1 month. Pt is on 4L oxygen .

## 2024-02-18 NOTE — ED Notes (Signed)
 Provider recommended pt to go to hospital, pt declined. Patient signed AMA form.

## 2024-02-19 ENCOUNTER — Ambulatory Visit (INDEPENDENT_AMBULATORY_CARE_PROVIDER_SITE_OTHER): Admitting: Internal Medicine

## 2024-02-19 ENCOUNTER — Encounter: Payer: Self-pay | Admitting: Internal Medicine

## 2024-02-19 VITALS — BP 122/78 | HR 96 | Ht 73.0 in | Wt 139.0 lb

## 2024-02-19 DIAGNOSIS — J439 Emphysema, unspecified: Secondary | ICD-10-CM | POA: Diagnosis not present

## 2024-02-19 DIAGNOSIS — R1319 Other dysphagia: Secondary | ICD-10-CM | POA: Diagnosis not present

## 2024-02-19 DIAGNOSIS — J4550 Severe persistent asthma, uncomplicated: Secondary | ICD-10-CM | POA: Insufficient documentation

## 2024-02-19 DIAGNOSIS — H6123 Impacted cerumen, bilateral: Secondary | ICD-10-CM | POA: Diagnosis not present

## 2024-02-19 DIAGNOSIS — R911 Solitary pulmonary nodule: Secondary | ICD-10-CM

## 2024-02-19 DIAGNOSIS — K4091 Unilateral inguinal hernia, without obstruction or gangrene, recurrent: Secondary | ICD-10-CM

## 2024-02-19 MED ORDER — ALBUTEROL SULFATE 1.25 MG/3ML IN NEBU: 1.0000 | INHALATION_SOLUTION | Freq: Two times a day (BID) | RESPIRATORY_TRACT | 12 refills | Status: AC

## 2024-02-19 NOTE — Progress Notes (Signed)
 Date:  02/19/2024   Name:  David Rhodes   DOB:  15-Mar-1956   MRN:  [id number]   Chief Complaint: CT Results  Shortness of Breath This is a chronic problem. The problem occurs constantly. The problem has been unchanged. Associated symptoms include abdominal pain (recurrent right inguinal hernia) and wheezing. Pertinent negatives include no chest pain or fever. His past medical history is significant for chronic lung disease and COPD.   Abnormal CXR - 02/13/24 Narrative & Impression  CLINICAL DATA:  Shortness of breath  EXAM: CHEST - 2 VIEW  COMPARISON:  10/20/2023, 11/28/2021, 08/14/2022  FINDINGS: The lungs are hyperinflated vague right suprahilar density and possible small 6 mm pulmonary nodule. Normal cardiac size. No pneumothorax  MPRESSION: Hyperinflation with vague right suprahilar density and possible small 6 mm pulmonary nodule. Suggest further evaluation with chest CT.  Dysphagia - he has coughing or gagging when he drinks fluids.  He has bad gerd pain if he runs out of antiacids.  He denies swallowing issues with food.    Review of Systems  Constitutional:  Positive for fatigue. Negative for chills and fever.  HENT:  Negative for trouble swallowing.        Ear fullness   Eyes:  Negative for visual disturbance.  Respiratory:  Positive for cough, shortness of breath and wheezing. Negative for chest tightness.   Cardiovascular:  Negative for chest pain and palpitations.  Gastrointestinal:  Positive for abdominal pain (recurrent right inguinal hernia).  Neurological:  Positive for tremors.  Psychiatric/Behavioral:  Negative for dysphoric mood and sleep disturbance. The patient is not nervous/anxious.      Lab Results  Component Value Date   NA 138 02/13/2024   K 3.7 02/13/2024   CO2 24 02/13/2024   GLUCOSE 88 02/13/2024   BUN 13 02/13/2024   CREATININE 0.88 02/13/2024   CALCIUM 10.3 02/13/2024   EGFR 83 04/25/2022   GFRNONAA >60 02/13/2024   Lab Results   Component Value Date   CHOL 156 04/25/2022   HDL 63 04/25/2022   LDLCALC 82 04/25/2022   TRIG 51 04/25/2022   CHOLHDL 2.5 04/25/2022   Lab Results  Component Value Date   TSH 2.890 04/25/2022   Lab Results  Component Value Date   HGBA1C 5.3 12/25/2018   Lab Results  Component Value Date   WBC 10.5 02/13/2024   HGB 15.0 02/13/2024   HCT 44.7 02/13/2024   MCV 87.6 02/13/2024   PLT 217 02/13/2024   Lab Results  Component Value Date   ALT 19 04/25/2022   AST 23 04/25/2022   ALKPHOS 115 04/25/2022   BILITOT 0.4 04/25/2022   No results found for: MARIEN BOLLS, VD25OH   Patient Active Problem List   Diagnosis Date Noted   Severe persistent asthma, unspecified whether complicated 02/19/2024   Other dysphagia 02/19/2024   End of battery life of deep brain stimulator 10/14/2023   Current moderate episode of major depressive disorder without prior episode (HCC) 02/26/2021   Primary osteoarthritis of first carpometacarpal joint of left hand 02/26/2021   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   Dependence on supplemental oxygen  04/18/2020   Leukocytosis 04/15/2019   COPD exacerbation (HCC) 12/30/2018   Inguinal hernia recurrent bilateral 11/16/2018   Chronic left shoulder pain 10/05/2018   PICC (peripherally inserted central catheter) in place 04/16/2018   Essential tremor 10/18/2017   Cervical radiculopathy 07/23/2017   Bilateral temporomandibular joint pain 07/23/2017   Neck pain 05/19/2017   Medication monitoring  encounter 09/11/2016   Pulmonary nodules 12/31/2015   COPD (chronic obstructive pulmonary disease) (HCC) 12/27/2015   Chronic cough 12/27/2015   Asthma 09/24/2015   Allergic rhinitis 11/11/2014   Nerve root pain 11/11/2014   Dyskinesia 11/11/2014   GERD (gastroesophageal reflux disease) 11/11/2014   Calcium blood increased 11/11/2014   Affective disorder (HCC) 11/11/2014   Essential hypertension 12/25/2011   Chronic back pain  12/25/2011    Allergies  Allergen Reactions   Penicillins Shortness Of Breath    PATIENT HAS HAD A PCN REACTION WITH IMMEDIATE RASH, FACIAL/TONGUE/THROAT SWELLING, SOB, OR LIGHTHEADEDNESS WITH HYPOTENSION:  #  #  YES  #  #  Has patient had a PCN reaction causing severe rash involving mucus membranes or skin necrosis: No Has patient had a PCN reaction that required hospitalization No Has patient had a PCN reaction occurring within the last 10 years: No If all of the above answers are NO, then may proceed with Cephalosporin use. **tolerated cefazolin  and cephalexin  03/2017    Past Surgical History:  Procedure Laterality Date   DEEP BRAIN STIMULATOR PLACEMENT  12/03/2006   Dr. Unice [facility], [place])   INGUINAL HERNIA REPAIR Bilateral 02/07/2014   Procedure:  BILATERAL INGUINAL HERNIA REPAIR;  Surgeon: Lynda Leos, MD;  Location: [facility] OR;  Service: General;  Laterality: Bilateral;   INGUINAL HERNIA REPAIR Right 07/05/2021   INSERTION OF MESH Bilateral 02/07/2014   Procedure: INSERTION OF MESH;  Surgeon: Lynda Leos, MD;  Location: [facility] OR;  Service: General;  Laterality: Bilateral;   NASAL SINUS SURGERY     PULSE GENERATOR IMPLANT Left 09/11/2016   Procedure: Left chest-Change implantable pulse generator battery;  Surgeon: Fairy Unice, MD;  Location: [facility] OR;  Service: Neurosurgery;  Laterality: Left;  Left chest-Change implantable pulse generator battery   PULSE GENERATOR IMPLANT Left 04/06/2018   Procedure: Revision of left chest implantable pulse generator, extension, and pocket adapter;  Surgeon: Unice Fairy, MD;  Location: [facility] OR;  Service: Neurosurgery;  Laterality: Left;   PULSE GENERATOR IMPLANT N/A 10/14/2023   Procedure: UNILATERAL PULSE GENERATOR IMPLANT;  Surgeon: Clois Fret, MD;  Location: [facility] ORS;  Service: Neurosurgery;  Laterality: N/A;  REPLACE PULSE GENERATOR FOR [employer]ON[employer] DEEP BRAIN STIMULATOR, LOCAL W/ MAC   SUBTHALAMIC STIMULATOR BATTERY REPLACEMENT   12/09/2011   Procedure: SUBTHALAMIC STIMULATOR BATTERY REPLACEMENT;  Surgeon: Fairy Unice, MD;  Location: [facility] NEURO ORS;  Service: Neurosurgery;  Laterality: N/A;   deep brain stimulator, implantable pulse generator change   SUBTHALAMIC STIMULATOR BATTERY REPLACEMENT Left 04/21/2014   Procedure: Deep brain stimulator battery change;  Surgeon: Fairy Unice, MD;  Location: [facility] NEURO ORS;  Service: Neurosurgery;  Laterality: Left;  Deep brain stimulator battery change   SUBTHALAMIC STIMULATOR BATTERY REPLACEMENT N/A 08/10/2018   Procedure: Replacement of deep brain stimulator pulse generator/left abdomen with long lead extensions;  Surgeon: Unice Fairy, MD;  Location: [facility] OR;  Service: Neurosurgery;  Laterality: N/A;    Social History   Tobacco Use   Smoking status: Never    Passive exposure: Past   Smokeless tobacco: Current    Types: Chew   Tobacco comments:    Trying to taper off.  Too expensive  Vaping Use   Vaping status: Never Used  Substance Use Topics   Alcohol use: No   Drug use: No     Medication list has been reviewed and updated.  Current Meds  Medication Sig   albuterol  (ACCUNEB ) 1.25 MG/3ML nebulizer solution Take 3 mLs (1.25 mg total) by nebulization  2 (two) times daily.   albuterol  (VENTOLIN  HFA) 108 (90 Base) MCG/ACT inhaler Inhale 2 puffs into the lungs every 6 (six) hours as needed for wheezing or shortness of breath.   baclofen  (LIORESAL ) 10 MG tablet TAKE 1 TABLET(10 MG) BY MOUTH THREE TIMES DAILY   budesonide  (PULMICORT ) 0.5 MG/2ML nebulizer solution Take 2 mLs (0.5 mg total) by nebulization daily.   gabapentin  (NEURONTIN ) 600 MG tablet TAKE 1 TABLET BY MOUTH EVERY MORNING, 1 TABLET AT NOON, 1 TABLET EVERY EVENING AND 1 TABLET EVERY NIGHT AT BEDTIME   levofloxacin  (LEVAQUIN ) 750 MG tablet Take 1 tablet (750 mg total) by mouth daily for 5 days.   loratadine  (CLARITIN ) 10 MG tablet Take 1 tablet (10 mg total) by mouth daily.   losartan  (COZAAR ) 50 MG tablet Take 1  tablet (50 mg total) by mouth daily. For high BP   meloxicam  (MOBIC ) 15 MG tablet Take 1 tablet (15 mg total) by mouth daily.   montelukast  (SINGULAIR ) 10 MG tablet Take 1 tablet (10 mg total) by mouth at bedtime. TAKE 1 TABLET(10 MG) BY MOUTH AT BEDTIME   omeprazole  (PRILOSEC) 20 MG capsule Take 1 capsule (20 mg total) by mouth daily.   OXYGEN  Inhale into the lungs. 4 L of oxygen    predniSONE  (DELTASONE ) 50 MG tablet Take 1 tablet (50 mg total) by mouth daily with breakfast for 5 days.   rOPINIRole  (REQUIP ) 2 MG tablet Take 1 tablet (2 mg total) by mouth 2 (two) times daily.   sertraline  (ZOLOFT ) 50 MG tablet Take 1 tablet (50 mg total) by mouth daily.       02/19/2024    2:26 PM 04/25/2022    8:44 AM 10/21/2021    8:28 AM 04/22/2021    9:23 AM  GAD 7 : Generalized Anxiety Score  Nervous, Anxious, on Edge 3 3 0 0  Control/stop worrying 0 0 0 0  Worry too much - different things 0 0 0 0  Trouble relaxing 2 2 0 1  Restless 3 3 0 0  Easily annoyed or irritable 0 0 0 0  Afraid - awful might happen 0 0 0 0  Total GAD 7 Score 8 8 0 1  Anxiety Difficulty Very difficult Extremely difficult  Not difficult at all       02/19/2024    2:26 PM 04/30/2022   10:21 AM 04/25/2022    8:42 AM  Depression screen PHQ 2/9  Decreased Interest 2 2 2   Down, Depressed, Hopeless 2 2 1   PHQ - 2 Score 4 4 3   Altered sleeping 2 1 1   Tired, decreased energy 3 3 2   Change in appetite 2 1 1   Feeling bad or failure about yourself  2 2 2   Trouble concentrating 1 1 1   Moving slowly or fidgety/restless 0 3 3  Suicidal thoughts 0 0 0  PHQ-9 Score 14 15 13   Difficult doing work/chores Somewhat difficult Very difficult Very difficult    BP Readings from Last 3 Encounters:  02/19/24 122/78  02/18/24 135/81  02/13/24 122/85    Physical Exam Vitals and nursing note reviewed.  Constitutional:      General: He is not in acute distress.    Appearance: Normal appearance. He is well-developed.  HENT:      Head: Normocephalic and atraumatic.     Right Ear: There is impacted cerumen.     Left Ear: There is impacted cerumen.  Cardiovascular:     Rate and Rhythm: Regular rhythm. Tachycardia  present.     Heart sounds: No murmur heard. Pulmonary:     Effort: Pulmonary effort is normal. No respiratory distress.     Breath sounds: No wheezing or rhonchi.  Musculoskeletal:     Cervical back: Normal range of motion.     Right lower leg: No edema.     Left lower leg: No edema.  Lymphadenopathy:     Cervical: No cervical adenopathy.  Skin:    General: Skin is warm and dry.     Capillary Refill: Capillary refill takes less than 2 seconds.     Findings: No rash.  Neurological:     General: No focal deficit present.     Mental Status: He is alert and oriented to person, place, and time.  Psychiatric:        Mood and Affect: Mood normal.        Behavior: Behavior normal.     Wt Readings from Last 3 Encounters:  02/19/24 139 lb (63 kg)  02/13/24 140 lb (63.5 kg)  11/16/23 144 lb (65.3 kg)    BP 122/78   Pulse 96   Ht 6' 1 (1.854 m)   Wt 139 lb (63 kg)   SpO2 94%   BMI 18.34 kg/m   Assessment and Plan:  Problem List Items Addressed This Visit       Unprioritized   COPD (chronic obstructive pulmonary disease) (HCC) (Chronic)   Recent exacerbation - seen by UC yesterday and put on steroid taper. He feels slightly better today. He is on budesonide  but not albuterol .      Relevant Medications   albuterol  (ACCUNEB ) 1.25 MG/3ML nebulizer solution   Severe persistent asthma, unspecified whether complicated   Overlap with COPD despite never smoking.      Relevant Medications   albuterol  (ACCUNEB ) 1.25 MG/3ML nebulizer solution   Other dysphagia   Choking and cough with all fluids but not foods. Persistent GERD symptoms despite omeprazole .      Relevant Orders   DG ESOPHAGUS W SINGLE CM (SOL OR THIN BA)   Other Visit Diagnoses       Right upper lobe pulmonary nodule    -   Primary   seen on recent CXR recomend Chest CT   Relevant Orders   CT CHEST WO CONTRAST     Impacted cerumen, bilateral       Relevant Orders   Ambulatory referral to ENT     Recurrent inguinal hernia of right side without obstruction or gangrene       will defer evaluation for now due to minimal discomfort and hx of repair x 2       No follow-ups on file.    Leita HILARIO Adie, MD [facility] Primary Care and Sports Medicine [place]

## 2024-02-19 NOTE — Assessment & Plan Note (Signed)
 Choking and cough with all fluids but not foods. Persistent GERD symptoms despite omeprazole .

## 2024-02-19 NOTE — Assessment & Plan Note (Signed)
 Recent exacerbation - seen by UC yesterday and put on steroid taper. He feels slightly better today. He is on budesonide  but not albuterol .

## 2024-02-19 NOTE — Patient Instructions (Signed)
  [facility] P[facility] SPECIAL[facility] [place]  [address]  [facility] [address]  [place], [place] [address]  [phone]

## 2024-02-19 NOTE — Transitions of Care (Post Inpatient/ED Visit) (Signed)
 02/19/2024  Name: David Rhodes MRN: [id number] DOB: 30-Oct-1955  Today's TOC FU Call Status: Today's TOC FU Call Status:: Successful TOC FU Call Completed Unsuccessful Call (1st Attempt) Date: 02/15/24 Unsuccessful Call (2nd Attempt) Date: 02/17/24 [facility] FU Call Complete Date: 02/19/24 Patient's Name and Date of Birth confirmed.  Transition Care Management Follow-up Telephone Call Date of Discharge: 02/13/24 Discharge Facility: [facility]) Type of Discharge: Emergency Department Reason for ED Visit: Other: How have you been since you were released from the hospital?: Same Any questions or concerns?: No  Items Reviewed: Did you receive and understand the discharge instructions provided?: Yes Medications obtained,verified, and reconciled?: Yes (Medications Reviewed) Any new allergies since your discharge?: No Dietary orders reviewed?: NA Do you have support at home?: No  Medications Reviewed Today: Medications Reviewed Today     Reviewed by Spiros Greenfeld N, CMA (Certified Medical Assistant) on 02/15/24 at 1426  Med List Status: <None>   Medication Order Taking? Sig Documenting Provider Last Dose Status Informant  albuterol  (VENTOLIN  HFA) 108 (90 Base) MCG/ACT inhaler [id number]  Inhale 2 puffs into the lungs every 6 (six) hours as needed for wheezing or shortness of breath. Justus Leita DEL, MD  Active   baclofen  (LIORESAL ) 10 MG tablet [id number]  TAKE 1 TABLET(10 MG) BY MOUTH THREE TIMES DAILY Justus Leita DEL, MD  Active   budesonide  (PULMICORT ) 0.5 MG/2ML nebulizer solution [id number]  Take 2 mLs (0.5 mg total) by nebulization daily. Justus Leita DEL, MD  Active   gabapentin  (NEURONTIN ) 600 MG tablet [id number]  TAKE 1 TABLET BY MOUTH EVERY MORNING, 1 TABLET AT NOON, 1 TABLET EVERY EVENING AND 1 TABLET EVERY NIGHT AT BEDTIME Justus Leita DEL, MD  Active   loratadine  (CLARITIN ) 10 MG tablet [id number]  Take 1 tablet (10 mg total) by mouth daily.  Patel, Sona, MD  Active Self           Med Note BEVERLEE ADE   Sat Dec 25, 2018  9:48 AM)    losartan  (COZAAR ) 50 MG tablet [id number]  Take 1 tablet (50 mg total) by mouth daily. For high BP Justus Leita DEL, MD  Active   meloxicam  (MOBIC ) 15 MG tablet [id number]  Take 1 tablet (15 mg total) by mouth daily. Justus Leita DEL, MD  Active   montelukast  (SINGULAIR ) 10 MG tablet [id number]  Take 1 tablet (10 mg total) by mouth at bedtime. TAKE 1 TABLET(10 MG) BY MOUTH AT BEDTIME Justus Leita DEL, MD  Active   omeprazole  (PRILOSEC) 20 MG capsule [id number]  Take 1 capsule (20 mg total) by mouth daily. Justus Leita DEL, MD  Active   OXYGEN  [id number]  Inhale into the lungs. 4 L of oxygen  [provider]  Active   rOPINIRole  (REQUIP ) 2 MG tablet [id number]  Take 1 tablet (2 mg total) by mouth 2 (two) times daily. Justus Leita DEL, MD  Active   sertraline  (ZOLOFT ) 50 MG tablet [id number]  Take 1 tablet (50 mg total) by mouth daily. Justus Leita DEL, MD  Active             Home Care and Equipment/Supplies: Were Home Health Services Ordered?: NA Any new equipment or medical supplies ordered?: NA  Functional Questionnaire: Do you need assistance with bathing/showering or dressing?: No Do you need assistance with meal preparation?: No Do you need assistance with eating?: No Do you have difficulty maintaining continence: No Do you need assistance with getting out of bed/getting out of a chair/moving?:  No Do you have difficulty managing or taking your medications?: No  Follow up appointments reviewed: PCP Follow-up appointment confirmed?: NA Specialist Hospital Follow-up appointment confirmed?: NA Do you need transportation to your follow-up appointment?: No Do you understand care options if your condition(s) worsen?: Yes-patient verbalized understanding    Kamarri Lovvorn [place]  Primary Care & Sports Medicine [facility] CMA, AAMA [address]   [place] [address] Office [phone]  Fax: [phone]

## 2024-02-19 NOTE — Assessment & Plan Note (Signed)
 Overlap with COPD despite never smoking.

## 2024-02-24 ENCOUNTER — Ambulatory Visit

## 2024-02-25 ENCOUNTER — Ambulatory Visit
Admission: RE | Admit: 2024-02-25 | Discharge: 2024-02-25 | Disposition: A | Discharge status: Home / Self Care | Source: Ambulatory Visit | Attending: Internal Medicine | Admitting: Internal Medicine

## 2024-02-25 DIAGNOSIS — R918 Other nonspecific abnormal finding of lung field: Secondary | ICD-10-CM | POA: Diagnosis not present

## 2024-02-25 DIAGNOSIS — E041 Nontoxic single thyroid nodule: Secondary | ICD-10-CM | POA: Diagnosis not present

## 2024-02-25 DIAGNOSIS — R911 Solitary pulmonary nodule: Secondary | ICD-10-CM | POA: Diagnosis not present

## 2024-02-25 DIAGNOSIS — I3139 Other pericardial effusion (noninflammatory): Secondary | ICD-10-CM | POA: Diagnosis not present

## 2024-03-01 ENCOUNTER — Ambulatory Visit: Payer: Self-pay

## 2024-03-01 ENCOUNTER — Ambulatory Visit: Payer: Self-pay | Admitting: *Deleted

## 2024-03-01 NOTE — Telephone Encounter (Signed)
 Call received from PAS to triage patient sx for SOB and difficulty breathing. Patient disconnected prior to transfer to NT. NT attempted to call patient # [phone] and no answer after multiple rings and unable to leave message. Called patient friend on [place] [phone] no answer, LVMTCB [phone] to review sx.    Please advise . CAL notified NT unsuccessful getting in touch with patient

## 2024-03-01 NOTE — Telephone Encounter (Signed)
 Copied from CRM #[id number]. Topic: Clinical - Red Word Triage >> Mar 01, 2024  3:37 PM Fonda T wrote: Red Word that prompted transfer to Nurse Triage: Received call from patient, reports increased shortness of breath, and difficulty. Patient reports was [previously on oxygen , but no longer has an oxygen  tank as he turned it in.

## 2024-03-01 NOTE — Telephone Encounter (Unsigned)
 Copied from CRM [id number]. Topic: Clinical - Lab/Test Results >> Mar 01, 2024  3:33 PM Fonda T wrote: Reason for CRM: Received call from patient, inquiring of test imaging results.  Per patient he is not active on mychart, but getting message notifications, and wanted to see if they were regarding test imaging results.  Patient requesting a return call to discuss results, can be reached at [phone].

## 2024-03-01 NOTE — Telephone Encounter (Signed)
 PAS reporting that she had tried to call pt back multiple times with no luck reaching him, also stating that nurse Jon stated she would try to call pt as well, PAS reporting she wanted to ensure NT knew about pt and that no further action needed on PAS end. Nurse Jon reported that she tried to call pt number and the friend on DPR and left message then she called CAL to report, then she routed encounter to office. No further action needed from NT at this time. Please see other triage encounter from 9/2.    Copied from CRM #[id number]. Topic: Clinical - Red Word Triage >> Mar 01, 2024  3:37 PM Fonda T wrote: Red Word that prompted transfer to Nurse Triage: Received call from patient, reports increased shortness of breath, and difficulty. Patient reports was [previously on oxygen , but no longer has an oxygen  tank as he turned it in. >> Mar 01, 2024  3:46 PM Fonda T wrote: Multiple attempts made to contact patient, as call was lost/dropped, spoke to NT, and states would reach back out to patient to contact.

## 2024-03-07 ENCOUNTER — Ambulatory Visit: Payer: Self-pay | Admitting: Internal Medicine

## 2024-03-07 DIAGNOSIS — E041 Nontoxic single thyroid nodule: Secondary | ICD-10-CM

## 2024-03-16 ENCOUNTER — Ambulatory Visit

## 2024-03-17 ENCOUNTER — Other Ambulatory Visit (HOSPITAL_COMMUNITY): Payer: Self-pay

## 2024-03-22 ENCOUNTER — Telehealth: Payer: Self-pay | Admitting: Neurology

## 2024-03-22 NOTE — Telephone Encounter (Signed)
 Left a message to get patient a 60 min movement appt with David Rhodes

## 2024-03-22 NOTE — Telephone Encounter (Signed)
 Pt called in this afternoon, and he stated that he needs some adjustment  to his brain stimulator box. Thanks

## 2024-03-28 NOTE — Telephone Encounter (Signed)
 Left a message to get patient a 60 min movement appt with Tat

## 2024-04-01 ENCOUNTER — Telehealth: Payer: Self-pay | Admitting: Neurology

## 2024-04-01 NOTE — Telephone Encounter (Signed)
 Pt called in this morning and he needs help with his box. Please call him. Thanks

## 2024-04-01 NOTE — Telephone Encounter (Signed)
 Called and left a VM we need to make a Movement  60 mins appt for his DBS

## 2024-04-05 ENCOUNTER — Ambulatory Visit
Admission: EM | Admit: 2024-04-05 | Discharge: 2024-04-05 | Disposition: A | Discharge status: Home / Self Care | Attending: Physician Assistant | Admitting: Physician Assistant

## 2024-04-05 DIAGNOSIS — R0602 Shortness of breath: Secondary | ICD-10-CM

## 2024-04-05 DIAGNOSIS — J441 Chronic obstructive pulmonary disease with (acute) exacerbation: Secondary | ICD-10-CM

## 2024-04-05 MED ORDER — DEXAMETHASONE SODIUM PHOSPHATE 10 MG/ML IJ SOLN
10.0000 mg | Freq: Once | INTRAMUSCULAR | Status: AC
  Administered 2024-04-05: 10 mg via INTRAMUSCULAR

## 2024-04-05 MED ORDER — PREDNISONE 10 MG PO TABS: ORAL_TABLET | ORAL | 0 refills | Status: DC

## 2024-04-05 MED ORDER — ALBUTEROL SULFATE HFA 108 (90 BASE) MCG/ACT IN AERS: 1.0000 | INHALATION_SPRAY | Freq: Four times a day (QID) | RESPIRATORY_TRACT | 0 refills | Status: DC | PRN

## 2024-04-05 MED ORDER — DOXYCYCLINE HYCLATE 100 MG PO CAPS
100.0000 mg | ORAL_CAPSULE | Freq: Two times a day (BID) | ORAL | 0 refills | Status: AC
Start: 2024-04-05 — End: 2024-04-12

## 2024-04-05 NOTE — ED Triage Notes (Signed)
 Pt c/o cough, sob & chest pain ongoing for awhile. States sx's worsening. Hx of COPD. Currently on 3L of O2. Has tried inhalers w/o relief.
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
## 2024-04-05 NOTE — Discharge Instructions (Addendum)
-   It is absolutely critical that you contact pulmonology for an appointment as soon as possible.  Your chronic condition is getting worse and you need to see them.  If you need another referral please ask your primary care provider to send one for you. - At this time continue inhalers, nebulizers and home oxygen . - You were given an injection of a corticosteroid in the clinic and I sent prednisone  taper for the next 12 days to the pharmacy. - You declined an x-ray to look for pneumonia.  I sent you an antibiotic.  However, if you develop a more productive cough, fever, worsening cough, increased chest pain or increased shortness of breath you need to call 911 or have someone take you to the ER.

## 2024-04-26 ENCOUNTER — Encounter: Payer: Self-pay | Admitting: Internal Medicine

## 2024-04-26 ENCOUNTER — Ambulatory Visit (INDEPENDENT_AMBULATORY_CARE_PROVIDER_SITE_OTHER): Admitting: Internal Medicine

## 2024-04-26 VITALS — BP 128/70 | HR 102 | Ht 72.0 in | Wt 153.0 lb

## 2024-04-26 DIAGNOSIS — J439 Emphysema, unspecified: Secondary | ICD-10-CM | POA: Diagnosis not present

## 2024-04-26 DIAGNOSIS — K4091 Unilateral inguinal hernia, without obstruction or gangrene, recurrent: Secondary | ICD-10-CM | POA: Diagnosis not present

## 2024-04-26 DIAGNOSIS — K219 Gastro-esophageal reflux disease without esophagitis: Secondary | ICD-10-CM | POA: Diagnosis not present

## 2024-04-26 MED ORDER — ALBUTEROL SULFATE HFA 108 (90 BASE) MCG/ACT IN AERS
1.0000 | INHALATION_SPRAY | Freq: Four times a day (QID) | RESPIRATORY_TRACT | 5 refills | Status: DC | PRN
Start: 2024-04-26 — End: 2024-05-07

## 2024-04-26 MED ORDER — OMEPRAZOLE 20 MG PO CPDR: 20.0000 mg | DELAYED_RELEASE_CAPSULE | Freq: Every day | ORAL | 1 refills | Status: AC

## 2024-04-26 NOTE — Progress Notes (Signed)
 Date:  04/26/2024   Name:  David Rhodes   DOB:  October 20, 1955   MRN:  [id number]   Chief Complaint: Shortness of Breath (Fatigue, SOB.)  Groin Pain This is a new problem. The current episode started more than 1 month ago. The problem has been unchanged. The pain is mild. Associated symptoms include chest pain (intermittent) and shortness of breath. Pertinent negatives include no chills, constipation, diarrhea, fever or headaches. Associated symptoms comments: Right groin swelling. His past medical history is significant for an inguinal hernia.  Gastroesophageal Reflux He complains of chest pain (intermittent), heartburn and wheezing. This is a recurrent problem. Associated symptoms include fatigue. He has tried a PPI (lost his omeprazole  Rx and symptoms have recurred) for the symptoms.  COPD/shortness of breath - chronic shortness of breath unchanged and dependent on oxygen .  He wants to get oxygen  tanks to use but his current supplier only has concentrators and portable battery powered concentrators.  He has not seen pulmonary since 2023.  Review of Systems  Constitutional:  Positive for fatigue. Negative for chills, fever and unexpected weight change.  Respiratory:  Positive for shortness of breath and wheezing. Negative for chest tightness.   Cardiovascular:  Positive for chest pain (intermittent). Negative for leg swelling.  Gastrointestinal:  Positive for heartburn. Negative for abdominal distention, constipation and diarrhea.  Genitourinary:        Groin pain and swelling on the right  Neurological:  Negative for dizziness and headaches.     Lab Results  Component Value Date   NA 138 02/13/2024   K 3.7 02/13/2024   CO2 24 02/13/2024   GLUCOSE 88 02/13/2024   BUN 13 02/13/2024   CREATININE 0.88 02/13/2024   CALCIUM 10.3 02/13/2024   EGFR 83 04/25/2022   GFRNONAA >60 02/13/2024   Lab Results  Component Value Date   CHOL 156 04/25/2022   HDL 63 04/25/2022   LDLCALC 82  04/25/2022   TRIG 51 04/25/2022   CHOLHDL 2.5 04/25/2022   Lab Results  Component Value Date   TSH 2.890 04/25/2022   Lab Results  Component Value Date   HGBA1C 5.3 12/25/2018   Lab Results  Component Value Date   WBC 10.5 02/13/2024   HGB 15.0 02/13/2024   HCT 44.7 02/13/2024   MCV 87.6 02/13/2024   PLT 217 02/13/2024   Lab Results  Component Value Date   ALT 19 04/25/2022   AST 23 04/25/2022   ALKPHOS 115 04/25/2022   BILITOT 0.4 04/25/2022   No results found for: MARIEN BOLLS, VD25OH   Patient Active Problem List   Diagnosis Date Noted   Severe persistent asthma, unspecified whether complicated (HCC) 02/19/2024   Other dysphagia 02/19/2024   End of battery life of deep brain stimulator 10/14/2023   Current moderate episode of major depressive disorder without prior episode (HCC) 02/26/2021   Primary osteoarthritis of first carpometacarpal joint of left hand 02/26/2021   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   Dependence on supplemental oxygen  04/18/2020   Leukocytosis 04/15/2019   COPD exacerbation (HCC) 12/30/2018   Recurrent inguinal hernia of right side without obstruction or gangrene 11/16/2018   Chronic left shoulder pain 10/05/2018   PICC (peripherally inserted central catheter) in place 04/16/2018   Essential tremor 10/18/2017   Cervical radiculopathy 07/23/2017   Bilateral temporomandibular joint pain 07/23/2017   Neck pain 05/19/2017   Medication monitoring encounter 09/11/2016   Pulmonary nodules 12/31/2015   COPD (chronic obstructive pulmonary disease) (HCC) 12/27/2015  Chronic cough 12/27/2015   Asthma 09/24/2015   Allergic rhinitis 11/11/2014   Nerve root pain 11/11/2014   Dyskinesia 11/11/2014   GERD (gastroesophageal reflux disease) 11/11/2014   Calcium blood increased 11/11/2014   Affective disorder 11/11/2014   Essential hypertension 12/25/2011   Chronic back pain 12/25/2011    Allergies  Allergen  Reactions   Penicillins Shortness Of Breath    PATIENT HAS HAD A PCN REACTION WITH IMMEDIATE RASH, FACIAL/TONGUE/THROAT SWELLING, SOB, OR LIGHTHEADEDNESS WITH HYPOTENSION:  #  #  YES  #  #  Has patient had a PCN reaction causing severe rash involving mucus membranes or skin necrosis: No Has patient had a PCN reaction that required hospitalization No Has patient had a PCN reaction occurring within the last 10 years: No If all of the above answers are NO, then may proceed with Cephalosporin use. **tolerated cefazolin  and cephalexin  03/2017    Past Surgical History:  Procedure Laterality Date   DEEP BRAIN STIMULATOR PLACEMENT  12/03/2006   Dr. Unice [facility] [place])   INGUINAL HERNIA REPAIR Bilateral 02/07/2014   Procedure:  BILATERAL INGUINAL HERNIA REPAIR;  Surgeon: Lynda Leos, MD;  Location: [facility] OR;  Service: General;  Laterality: Bilateral;   INGUINAL HERNIA REPAIR Right 07/05/2021   INSERTION OF MESH Bilateral 02/07/2014   Procedure: INSERTION OF MESH;  Surgeon: Lynda Leos, MD;  Location: [facility] OR;  Service: General;  Laterality: Bilateral;   NASAL SINUS SURGERY     PULSE GENERATOR IMPLANT Left 09/11/2016   Procedure: Left chest-Change implantable pulse generator battery;  Surgeon: Fairy Unice, MD;  Location: [facility] OR;  Service: Neurosurgery;  Laterality: Left;  Left chest-Change implantable pulse generator battery   PULSE GENERATOR IMPLANT Left 04/06/2018   Procedure: Revision of left chest implantable pulse generator, extension, and pocket adapter;  Surgeon: Unice Fairy, MD;  Location: [facility] OR;  Service: Neurosurgery;  Laterality: Left;   PULSE GENERATOR IMPLANT N/A 10/14/2023   Procedure: UNILATERAL PULSE GENERATOR IMPLANT;  Surgeon: Clois Fret, MD;  Location: [facility] ORS;  Service: Neurosurgery;  Laterality: N/A;  REPLACE PULSE GENERATOR FOR [employer] DEEP BRAIN STIMULATOR, LOCAL W/ MAC   SUBTHALAMIC STIMULATOR BATTERY REPLACEMENT  12/09/2011   Procedure: SUBTHALAMIC  STIMULATOR BATTERY REPLACEMENT;  Surgeon: Fairy Unice, MD;  Location: [facility] NEUR[facility] ORS;  Service: Neurosurgery;  Laterality: N/A;   deep brain stimulator, implantable pulse generator change   SUBTHALAMIC STIMULATOR BATTERY REPLACEMENT Left 04/21/2014   Procedure: Deep brain stimulator battery change;  Surgeon: Fairy Unice, MD;  Location: [facility] NEUR[facility] ORS;  Service: Neurosurgery;  Laterality: Left;  Deep brain stimulator battery change   SUBTHALAMIC STIMULATOR BATTERY REPLACEMENT N/A 08/10/2018   Procedure: Replacement of deep brain stimulator pulse generator/left abdomen with long lead extensions;  Surgeon: Unice Fairy, MD;  Location: [facility] OR;  Service: Neurosurgery;  Laterality: N/A;    Social History   Tobacco Use   Smoking status: Never    Passive exposure: Past   Smokeless tobacco: Current    Types: Chew   Tobacco comments:    Trying to taper off.  Too expensive  Vaping Use   Vaping status: Never Used  Substance Use Topics   Alcohol use: No   Drug use: No     Medication list has been reviewed and updated.  Current Meds  Medication Sig   albuterol  (ACCUNEB ) 1.25 MG/3ML nebulizer solution Take 3 mLs (1.25 mg total) by nebulization 2 (two) times daily.   albuterol  (VENTOLIN  HFA) 108 (90 Base) MCG/ACT inhaler Inhale 2 puffs into the  lungs every 6 (six) hours as needed for wheezing or shortness of breath.   baclofen  (LIORESAL ) 10 MG tablet TAKE 1 TABLET(10 MG) BY MOUTH THREE TIMES DAILY   budesonide  (PULMICORT ) 0.5 MG/2ML nebulizer solution Take 2 mLs (0.5 mg total) by nebulization daily.   gabapentin  (NEURONTIN ) 600 MG tablet TAKE 1 TABLET BY MOUTH EVERY MORNING, 1 TABLET AT NOON, 1 TABLET EVERY EVENING AND 1 TABLET EVERY NIGHT AT BEDTIME   loratadine  (CLARITIN ) 10 MG tablet Take 1 tablet (10 mg total) by mouth daily.   losartan  (COZAAR ) 50 MG tablet Take 1 tablet (50 mg total) by mouth daily. For high BP   meloxicam  (MOBIC ) 15 MG tablet Take 1 tablet (15 mg total) by mouth daily.    montelukast  (SINGULAIR ) 10 MG tablet Take 1 tablet (10 mg total) by mouth at bedtime. TAKE 1 TABLET(10 MG) BY MOUTH AT BEDTIME   OXYGEN  Inhale into the lungs. 4 L of oxygen    predniSONE  (DELTASONE ) 10 MG tablet Take 6 tabs p.o. x 3 days, 5 tabs x 2 days, 4 tabs x 2 days, 3 tabs x 2 days, 2 tabs x 2 days and 1 tab x 2 days   rOPINIRole  (REQUIP ) 2 MG tablet Take 1 tablet (2 mg total) by mouth 2 (two) times daily.   sertraline  (ZOLOFT ) 50 MG tablet Take 1 tablet (50 mg total) by mouth daily.   [DISCONTINUED] albuterol  (VENTOLIN  HFA) 108 (90 Base) MCG/ACT inhaler Inhale 1-2 puffs into the lungs every 6 (six) hours as needed for wheezing or shortness of breath.   [DISCONTINUED] omeprazole  (PRILOSEC) 20 MG capsule Take 1 capsule (20 mg total) by mouth daily.       04/26/2024    2:20 PM 02/19/2024    2:26 PM 04/25/2022    8:44 AM 10/21/2021    8:28 AM  GAD 7 : Generalized Anxiety Score  Nervous, Anxious, on Edge 2 3 3  0  Control/stop worrying 0 0 0 0  Worry too much - different things 0 0 0 0  Trouble relaxing 1 2 2  0  Restless 1 3 3  0  Easily annoyed or irritable 0 0 0 0  Afraid - awful might happen 1 0 0 0  Total GAD 7 Score 5 8 8  0  Anxiety Difficulty Somewhat difficult Very difficult Extremely difficult        04/26/2024    2:19 PM 02/19/2024    2:26 PM 04/30/2022   10:21 AM  Depression screen PHQ 2/9  Decreased Interest 2 2 2   Down, Depressed, Hopeless 2 2 2   PHQ - 2 Score 4 4 4   Altered sleeping 2 2 1   Tired, decreased energy 3 3 3   Change in appetite 3 2 1   Feeling bad or failure about yourself  2 2 2   Trouble concentrating 1 1 1   Moving slowly or fidgety/restless 2 0 3  Suicidal thoughts 0 0 0  PHQ-9 Score 17 14 15   Difficult doing work/chores Very difficult Somewhat difficult Very difficult    BP Readings from Last 3 Encounters:  04/26/24 128/70  04/05/24 (!) 123/101  02/19/24 122/78    Physical Exam Constitutional:      General: He is not in acute  distress. Cardiovascular:     Rate and Rhythm: Normal rate and regular rhythm.  Pulmonary:     Effort: Accessory muscle usage present.     Breath sounds: Decreased breath sounds present. No wheezing.  Abdominal:     Hernia: A hernia is present.  Hernia is present in the right inguinal area (golf ball sized non tender reducible hernia).  Genitourinary:   Neurological:     Mental Status: He is alert.     Wt Readings from Last 3 Encounters:  04/26/24 153 lb (69.4 kg)  04/05/24 140 lb (63.5 kg)  02/19/24 139 lb (63 kg)    BP 128/70   Pulse (!) 102   Ht 6' (1.829 m)   Wt 153 lb (69.4 kg) Comment: Patient weighed with portable oxygen  purse.  SpO2 98% Comment: Patient on oxygen .  BMI 20.75 kg/m   Assessment and Plan:  Problem List Items Addressed This Visit       Unprioritized   GERD (gastroesophageal reflux disease) (Chronic)   Recently misplaced his omeprazole  Rx. He has subsequently had more reflux symptoms. Will refill omeprazole .      Relevant Medications   omeprazole  (PRILOSEC) 20 MG capsule   COPD (chronic obstructive pulmonary disease) (HCC) (Chronic)   Relevant Medications   albuterol  (VENTOLIN  HFA) 108 (90 Base) MCG/ACT inhaler   Other Relevant Orders   Ambulatory referral to Pulmonology   Recurrent inguinal hernia of right side without obstruction or gangrene - Primary   Recurrent right sided hernia. Last repaired with mesh at [facility] in 2020      Relevant Orders   Ambulatory referral to General Surgery    No follow-ups on file.    Leita HILARIO Adie, MD [facility] [place]

## 2024-04-26 NOTE — Assessment & Plan Note (Signed)
 Recently misplaced his omeprazole  Rx. He has subsequently had more reflux symptoms. Will refill omeprazole .

## 2024-04-26 NOTE — Assessment & Plan Note (Signed)
 Recurrent right sided hernia. Last repaired with mesh at [facility] in 2020

## 2024-05-06 NOTE — Progress Notes (Unsigned)
 Assessment/Plan:   1.  Essential Tremor.  - Status post bilateral VIM DBS in June, 2008 with Dr. Unice.  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  This was removed and IPG is now on the left abdomen.  Last IPG change was October 14, 2023  - Patient has had speech change with the left brain lead.  This has been reprogrammed multiple times, without significant improvement.  - Poor compliance with follow-up is a big issue.  He and I had a long discussion about this today.  We talked about the reasons for needing follow-up.  He has had significant detrimental issues from not following up in the past.  He has run out of battery for years at a time.  His IPG eroded through the chest years ago and he left it that way for several months without letting anybody know.  We talked about the fact that he needed to be seen at least once a year and he said he would be agreeable to that.  -***lung meds make his tremor worse 2.  Restless leg  - Is ***off high dose gabapentin   - on ropinirole  2 mg twice per day.  Discussed with patient that this medication is no longer recommended by the American Academy of sleep medicine for restless leg because of augmentation.  He is on quite a large dose and I would recommend that this medication be tapered.  Subjective:   David Rhodes was seen in consultation in the movement disorder clinic at the request of Justus Leita DEL, MD.  The evaluation is for tremor.  Patient was last seen in May.  He has been in the urgent care a few times since last visit for his COPD.  He was seen in the ER 2 days ago and they wanted to admit him but he signed out AMA.  When he drinks the L hand shakes worse than the R.     Allergies  Allergen Reactions   Penicillins Shortness Of Breath    PATIENT HAS HAD A PCN REACTION WITH IMMEDIATE RASH, FACIAL/TONGUE/THROAT SWELLING, SOB, OR LIGHTHEADEDNESS WITH HYPOTENSION:  #  #  YES  #  #  Has patient had a PCN  reaction causing severe rash involving mucus membranes or skin necrosis: No Has patient had a PCN reaction that required hospitalization No Has patient had a PCN reaction occurring within the last 10 years: No If all of the above answers are NO, then may proceed with Cephalosporin use. **tolerated cefazolin  and cephalexin  03/2017    No outpatient medications have been marked as taking for the 05/09/24 encounter (Appointment) with Giovanny Dugal, [place] RAMAN, DO.      Objective:   VITALS:   There were no vitals filed for this visit.  Gen:  Appears stated age and in NAD. HEENT:  Normocephalic, atraumatic. The mucous membranes are moist. The superficial temporal arteries are without ropiness or tenderness. Cardiovascular: Regular rate and rhythm. Lungs: Clear to auscultation bilaterally. Neck: There are no carotid bruits noted bilaterally.  NEUROLOGICAL:  Orientation:  The patient is alert and oriented x 3.   Cranial nerves: There is good facial symmetry. Extraocular muscles are intact and visual fields are full to confrontational testing. Speech is fluent and mildly dysarthric. Soft palate rises symmetrically and there is no tongue deviation. Hearing is intact to conversational tone. Tone: Tone is good throughout. Sensation: Sensation is intact to light touch touch throughout (facial, trunk, extremities).  Coordination:  The  patient has no dysdiadichokinesia or dysmetria. Motor: Strength is 5/5 in the bilateral upper and lower extremities.  Shoulder shrug is equal bilaterally.  There is no pronator drift.  There are no fasciculations noted. Gait and Station: He is initially slightly off balance when he gets out of the chair.  The patient ambulates well in the hall with his oxygen , however.    MOVEMENT EXAM: Tremor:  There is no rest tremor.  There is no postural tremor.  There is very little intention tremor on either side (and this was really corrected post programming).  I have reviewed and  interpreted the following labs independently   Chemistry      Component Value Date/Time   NA 138 02/13/2024 0042   NA 142 04/25/2022 0928   NA 138 06/26/2012 1812   K 3.7 02/13/2024 0042   K 4.0 06/26/2012 1812   CL 108 02/13/2024 0042   CL 106 06/26/2012 1812   CO2 24 02/13/2024 0042   CO2 24 06/26/2012 1812   BUN 13 02/13/2024 0042   BUN 16 04/25/2022 0928   BUN 13 06/26/2012 1812   CREATININE 0.88 02/13/2024 0042   CREATININE 0.76 04/16/2018 1206      Component Value Date/Time   CALCIUM 10.3 02/13/2024 0042   CALCIUM 9.4 06/26/2012 1812   ALKPHOS 115 04/25/2022 0928   ALKPHOS 84 07/20/2011 0208   AST 23 04/25/2022 0928   AST 18 07/20/2011 0208   ALT 19 04/25/2022 0928   ALT 22 07/20/2011 0208   BILITOT 0.4 04/25/2022 0928   BILITOT 0.3 07/20/2011 0208      Lab Results  Component Value Date   WBC 10.5 02/13/2024   HGB 15.0 02/13/2024   HCT 44.7 02/13/2024   MCV 87.6 02/13/2024   PLT 217 02/13/2024   Lab Results  Component Value Date   TSH 2.890 04/25/2022      Total time spent on today's visit was *** minutes, including both face-to-face time and nonface-to-face time.  Time included that spent on review of records (prior notes available to me/labs/imaging if pertinent), discussing treatment and goals, answering patient's questions and coordinating care.  This did not include the DBS time.  CC:  Justus Leita DEL, MD

## 2024-05-07 ENCOUNTER — Encounter: Payer: Self-pay | Admitting: Emergency Medicine

## 2024-05-07 ENCOUNTER — Other Ambulatory Visit: Payer: Self-pay

## 2024-05-07 ENCOUNTER — Emergency Department

## 2024-05-07 ENCOUNTER — Observation Stay
Admission: EM | Admit: 2024-05-07 | Discharge: 2024-05-07 | Disposition: A | Discharge status: Home / Self Care | Attending: Hospitalist | Admitting: Hospitalist

## 2024-05-07 DIAGNOSIS — R059 Cough, unspecified: Secondary | ICD-10-CM | POA: Diagnosis not present

## 2024-05-07 DIAGNOSIS — R0602 Shortness of breath: Secondary | ICD-10-CM | POA: Diagnosis not present

## 2024-05-07 DIAGNOSIS — J441 Chronic obstructive pulmonary disease with (acute) exacerbation: Principal | ICD-10-CM | POA: Diagnosis present

## 2024-05-07 DIAGNOSIS — R251 Tremor, unspecified: Secondary | ICD-10-CM | POA: Insufficient documentation

## 2024-05-07 DIAGNOSIS — G8929 Other chronic pain: Secondary | ICD-10-CM | POA: Diagnosis present

## 2024-05-07 DIAGNOSIS — K219 Gastro-esophageal reflux disease without esophagitis: Secondary | ICD-10-CM | POA: Diagnosis not present

## 2024-05-07 DIAGNOSIS — G250 Essential tremor: Secondary | ICD-10-CM | POA: Diagnosis present

## 2024-05-07 DIAGNOSIS — R0789 Other chest pain: Secondary | ICD-10-CM | POA: Diagnosis not present

## 2024-05-07 DIAGNOSIS — M549 Dorsalgia, unspecified: Secondary | ICD-10-CM | POA: Diagnosis not present

## 2024-05-07 DIAGNOSIS — R079 Chest pain, unspecified: Secondary | ICD-10-CM

## 2024-05-07 DIAGNOSIS — J9601 Acute respiratory failure with hypoxia: Secondary | ICD-10-CM | POA: Insufficient documentation

## 2024-05-07 LAB — CBC WITH DIFFERENTIAL/PLATELET
Abs Immature Granulocytes: 0.03 K/uL (ref 0.00–0.07)
Basophils Absolute: 0.1 K/uL (ref 0.0–0.1)
Basophils Relative: 1 %
Eosinophils Absolute: 0.8 K/uL — ABNORMAL HIGH (ref 0.0–0.5)
Eosinophils Relative: 8 %
HCT: 44.5 % (ref 39.0–52.0)
Hemoglobin: 14.5 g/dL (ref 13.0–17.0)
Immature Granulocytes: 0 %
Lymphocytes Relative: 27 %
Lymphs Abs: 2.6 K/uL (ref 0.7–4.0)
MCH: 29.2 pg (ref 26.0–34.0)
MCHC: 32.6 g/dL (ref 30.0–36.0)
MCV: 89.7 fL (ref 80.0–100.0)
Monocytes Absolute: 0.6 K/uL (ref 0.1–1.0)
Monocytes Relative: 6 %
Neutro Abs: 5.4 K/uL (ref 1.7–7.7)
Neutrophils Relative %: 58 %
Platelets: 219 K/uL (ref 150–400)
RBC: 4.96 MIL/uL (ref 4.22–5.81)
RDW: 13.2 % (ref 11.5–15.5)
WBC: 9.5 K/uL (ref 4.0–10.5)
nRBC: 0 % (ref 0.0–0.2)

## 2024-05-07 LAB — TROPONIN I (HIGH SENSITIVITY): Troponin I (High Sensitivity): 5 ng/L (ref <18)

## 2024-05-07 LAB — RESP PANEL BY RT-PCR (RSV, FLU A&B, COVID)  RVPGX2
Influenza A by PCR: NEGATIVE
Influenza B by PCR: NEGATIVE
Resp Syncytial Virus by PCR: NEGATIVE
SARS Coronavirus 2 by RT PCR: NEGATIVE

## 2024-05-07 LAB — BASIC METABOLIC PANEL WITH GFR
Anion gap: 11 (ref 5–15)
BUN: 8 mg/dL (ref 8–23)
CO2: 25 mmol/L (ref 22–32)
Calcium: 9.9 mg/dL (ref 8.9–10.3)
Chloride: 102 mmol/L (ref 98–111)
Creatinine, Ser: 1.05 mg/dL (ref 0.61–1.24)
GFR, Estimated: >60 mL/min (ref >60)
Glucose, Bld: 102 mg/dL — ABNORMAL HIGH (ref 70–99)
Potassium: 4.1 mmol/L (ref 3.5–5.1)
Sodium: 138 mmol/L (ref 135–145)

## 2024-05-07 LAB — BRAIN NATRIURETIC PEPTIDE: B Natriuretic Peptide: 21.5 pg/mL (ref 0.0–100.0)

## 2024-05-07 MED ORDER — ENOXAPARIN SODIUM 40 MG/0.4ML IJ SOSY: 40.0000 mg | PREFILLED_SYRINGE | INTRAMUSCULAR | Status: DC

## 2024-05-07 MED ORDER — SODIUM CHLORIDE 0.9 % IV SOLN
100.0000 mg | Freq: Once | INTRAVENOUS | Status: AC
  Administered 2024-05-07: 100 mg via INTRAVENOUS
  Filled 2024-05-07: qty 100

## 2024-05-07 MED ORDER — IPRATROPIUM-ALBUTEROL 0.5-2.5 (3) MG/3ML IN SOLN
3.0000 mL | Freq: Four times a day (QID) | RESPIRATORY_TRACT | Status: DC
  Administered 2024-05-07: 3 mL via RESPIRATORY_TRACT
  Filled 2024-05-07: qty 3

## 2024-05-07 MED ORDER — ALBUTEROL SULFATE (2.5 MG/3ML) 0.083% IN NEBU: 2.5000 mg | INHALATION_SOLUTION | RESPIRATORY_TRACT | Status: DC | PRN

## 2024-05-07 MED ORDER — ACETAMINOPHEN 325 MG RE SUPP: 650.0000 mg | Freq: Four times a day (QID) | RECTAL | Status: DC | PRN

## 2024-05-07 MED ORDER — ONDANSETRON HCL 4 MG PO TABS: 4.0000 mg | ORAL_TABLET | Freq: Four times a day (QID) | ORAL | Status: DC | PRN

## 2024-05-07 MED ORDER — PREDNISONE 20 MG PO TABS: 40.0000 mg | ORAL_TABLET | Freq: Every day | ORAL | Status: DC

## 2024-05-07 MED ORDER — ACETAMINOPHEN 325 MG PO TABS: 650.0000 mg | ORAL_TABLET | Freq: Four times a day (QID) | ORAL | Status: DC | PRN

## 2024-05-07 MED ORDER — ONDANSETRON HCL 4 MG/2ML IJ SOLN: 4.0000 mg | Freq: Four times a day (QID) | INTRAMUSCULAR | Status: DC | PRN

## 2024-05-07 MED ORDER — IPRATROPIUM-ALBUTEROL 0.5-2.5 (3) MG/3ML IN SOLN
3.0000 mL | Freq: Once | RESPIRATORY_TRACT | Status: AC
  Administered 2024-05-07: 3 mL via RESPIRATORY_TRACT
  Filled 2024-05-07: qty 3

## 2024-05-07 MED ORDER — ALBUTEROL SULFATE (2.5 MG/3ML) 0.083% IN NEBU
10.0000 mg/h | INHALATION_SOLUTION | Freq: Once | RESPIRATORY_TRACT | Status: AC
  Administered 2024-05-07: 10 mg/h via RESPIRATORY_TRACT
  Filled 2024-05-07: qty 12

## 2024-05-07 MED ORDER — HYDROCODONE-ACETAMINOPHEN 5-325 MG PO TABS: 1.0000 | ORAL_TABLET | ORAL | Status: DC | PRN

## 2024-05-07 MED ORDER — GUAIFENESIN ER 600 MG PO TB12: 600.0000 mg | ORAL_TABLET | Freq: Two times a day (BID) | ORAL | Status: DC

## 2024-05-07 MED ORDER — METHYLPREDNISOLONE SODIUM SUCC 40 MG IJ SOLR
40.0000 mg | Freq: Two times a day (BID) | INTRAMUSCULAR | Status: DC
  Administered 2024-05-07: 40 mg via INTRAVENOUS
  Filled 2024-05-07: qty 1

## 2024-05-07 NOTE — ED Notes (Addendum)
 Pt stating he would like to take off BiPap. States feels ok to breathe. Pt placed on 3L [place] per home use.

## 2024-05-07 NOTE — ED Triage Notes (Signed)
 Pt via [facility] from home with c/o COPD exascerbation. Per EMS pt given 1 duoneb, 2g Mag, 125 solumedrol, 2 duoneb/1 albuterol . Pt reports is normally on 3L. Pt arrives on CPAP.

## 2024-05-07 NOTE — ED Notes (Signed)
 Pt stating spoke with Dr and would need to sign AMA form to leave. Pt still wanting to leave at this time. Asked pt if he would like neb treatment before calling for a ride, pt stating he would.

## 2024-05-07 NOTE — ED Notes (Addendum)
 Admitting physician called without answer. Pt still wanting to leave. Unable to send a message to Dr Cleatus as well.

## 2024-05-07 NOTE — ED Provider Notes (Signed)
 David Rhodes Provider Note    Event Date/Time   First MD Initiated Contact with Patient 05/07/24 [phone]     (approximate)   History   Shortness of Breath   HPI  David Rhodes is a 68 y.o. male history of COPD on 3 L at baseline, chronic pain, GERD, presenting with shortness of breath and cough for the past week.  No fever at home.  Patient states that he has some lower chest pain from the cough.  No nausea vomiting, no history of CHF.  Per independent history from EMS, patient was very tight when they evaluated him, they gave him 2 DuoNebs, 1 albuterol , 2 g of mag, 125 mg of Solu-Medrol .  Placed him on CPAP with improvement to his breathing.  On independent review, he was seen by primary care in August for chronic shortness of breath.  Has history of chronic lung disease and COPD, has had exacerbations in the past requiring steroid taper.     Physical Exam   Triage Vital Signs: ED Triage Vitals [05/07/24 0324]  Encounter Vitals Group     BP      Girls Systolic BP Percentile      Girls Diastolic BP Percentile      Boys Systolic BP Percentile      Boys Diastolic BP Percentile      Pulse      Resp      Temp      Temp src      SpO2      Weight 153 lb (69.4 kg)     Height 6' (1.829 m)     Head Circumference      Peak Flow      Pain Score 0     Pain Loc      Pain Education      Exclude from Growth Chart     Most recent vital signs: Vitals:   05/07/24 0334 05/07/24 0349  BP:    Pulse:    Resp:    SpO2: 100% 100%     General: Awake, no distress.  CV:  Good peripheral perfusion.  Resp:  Normal effort.  Diffuse wheezing, tachypneic Abd:  No distention.  Soft nontender Other:  No lower extremity edema   ED Results / Procedures / Treatments   Labs (all labs ordered are listed, but only abnormal results are displayed) Labs Reviewed  CBC WITH DIFFERENTIAL/PLATELET - Abnormal; Notable for the following components:      Result Value    Eosinophils Absolute 0.8 (*)    All other components within normal limits  BASIC METABOLIC PANEL WITH GFR - Abnormal; Notable for the following components:   Glucose, Bld 102 (*)    All other components within normal limits  RESP PANEL BY RT-PCR (RSV, FLU A&B, COVID)  RVPGX2  BRAIN NATRIURETIC PEPTIDE  HIV ANTIBODY (ROUTINE TESTING W REFLEX)  TROPONIN I (HIGH SENSITIVITY)     EKG  EKG shows, EKG shows sinus rhythm, there are P waves before every QRS even though it was read as A-V dissociation, rate of 86, there is a lot of artifact due to brain stimulator, no obvious ischemic ST elevation, T wave flattening to 3, T wave changes new compared to prior   RADIOLOGY On my independent interpretation, chest x-ray without obvious consolidation   PROCEDURES:  Critical Care performed: Yes, see critical care procedure note(s)  .Critical Care  Performed by: Waymond Lorelle Cummins, MD Authorized by: Waymond Lorelle Cummins,  MD   Critical care provider statement:    Critical care time (minutes):  40   Critical care was necessary to treat or prevent imminent or life-threatening deterioration of the following conditions:  Respiratory failure   Critical care was time spent personally by me on the following activities:  Development of treatment plan with patient or surrogate, discussions with consultants, evaluation of patient's response to treatment, examination of patient, ordering and review of laboratory studies, ordering and review of radiographic studies, ordering and performing treatments and interventions, pulse oximetry, re-evaluation of patient's condition and review of old charts    MEDICATIONS ORDERED IN ED: Medications  enoxaparin  (LOVENOX ) injection 40 mg (has no administration in time range)  acetaminophen  (TYLENOL ) tablet 650 mg (has no administration in time range)    Or  acetaminophen  (TYLENOL ) suppository 650 mg (has no administration in time range)  ondansetron  (ZOFRAN ) tablet 4 mg (has no  administration in time range)    Or  ondansetron  (ZOFRAN ) injection 4 mg (has no administration in time range)  methylPREDNISolone  sodium succinate (SOLU-MEDROL ) 40 mg/mL injection 40 mg (has no administration in time range)    Followed by  predniSONE  (DELTASONE ) tablet 40 mg (has no administration in time range)  ipratropium-albuterol  (DUONEB) 0.5-2.5 (3) MG/3ML nebulizer solution 3 mL (has no administration in time range)  albuterol  (PROVENTIL ) (2.5 MG/3ML) 0.083% nebulizer solution 2.5 mg (has no administration in time range)  HYDROcodone -acetaminophen  (NORCO/VICODIN) 5-325 MG per tablet 1-2 tablet (has no administration in time range)  guaiFENesin  (MUCINEX ) 12 hr tablet 600 mg (has no administration in time range)  ipratropium-albuterol  (DUONEB) 0.5-2.5 (3) MG/3ML nebulizer solution 3 mL (3 mLs Nebulization Given 05/07/24 0334)  albuterol  (PROVENTIL ) (2.5 MG/3ML) 0.083% nebulizer solution (10 mg/hr Nebulization Given 05/07/24 0349)  doxycycline  (VIBRAMYCIN ) 100 mg in sodium chloride  0.9 % 250 mL IVPB (0 mg Intravenous Stopped 05/07/24 0549)     IMPRESSION / MDM / ASSESSMENT AND PLAN / ED COURSE  I reviewed the triage vital signs and the nursing notes.                              Differential diagnosis includes, but is not limited to, COPD exacerbation, viral illness, COVID, influenza, RSV, pneumonia, arrhythmia, atypical ACS.  Musculoskeletal pain, strain.  Will get labs, EKG, troponin, chest x-ray, will switch him to BiPAP.  Will give him a DuoNeb as well as continuous albuterol .  Will also treat for COPD exacerbation with IV doxycycline .  Likely will need to be admitted for further management.  Patient's presentation is most consistent with acute presentation with potential threat to life or bodily function.  Independent interpretation of labs and imaging below.  On reassessment patient is feeling better on the BiPAP, he is moving more air, sparse wheezing now.  Given his COPD  exacerbation requiring BiPAP, he will need to be admitted for further management.  Consult to hospitalist will admit the patient.  He is admitted.  The patient is on the cardiac monitor to evaluate for evidence of arrhythmia and/or significant heart rate changes.   Clinical Course as of 05/07/24 9385  Sat May 07, 2024  0414 DG Chest 1 View 1. No acute cardiopulmonary process.  [TT]  [id number] Independent review of labs, troponin not elevated, BNP is not elevated, no leukocytosis.  Electrolytes not severely deranged. [TT]  0456 Resp panel by RT-PCR (RSV, Flu A&B, Covid) Anterior Nasal Swab Negative [TT]    Clinical Course  User Index [TT] Waymond Lorelle Cummins, MD     FINAL CLINICAL IMPRESSION(S) / ED DIAGNOSES   Final diagnoses:  COPD exacerbation (HCC)  Cough, unspecified type  Shortness of breath  Chest pain, unspecified type     Rx / DC Orders   ED Discharge Orders     None        Note:  This document was prepared using Dragon voice recognition software and may include unintentional dictation errors.    Waymond Lorelle Cummins, MD 05/07/24 [phone]
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
## 2024-05-07 NOTE — ED Notes (Signed)
 Pt informing this RN he would like to leave. MD to be made aware.

## 2024-05-09 ENCOUNTER — Ambulatory Visit: Admitting: Neurology

## 2024-05-09 VITALS — BP 169/133 | HR 106 | Wt 153.0 lb

## 2024-05-09 DIAGNOSIS — J439 Emphysema, unspecified: Secondary | ICD-10-CM

## 2024-05-09 DIAGNOSIS — Z9689 Presence of other specified functional implants: Secondary | ICD-10-CM | POA: Diagnosis not present

## 2024-05-09 DIAGNOSIS — G250 Essential tremor: Secondary | ICD-10-CM | POA: Diagnosis not present

## 2024-05-10 NOTE — Procedures (Signed)
 DBS Programming was performed.    Manufacturer of DBS device: [employer]  Total time spent programming was 25 minutes.  Device was confirmed to be on.  Soft start was confirmed to be on.  Impedences were checked and were within normal limits.  Battery was checked and was determined to be functioning normally and not near the end of life.  IPG is in the L abdomen.   Final settings were as follows:   Active Contact Amplitude (mA) PW (ms) Frequency (hz) Side Effects Battery  Left Brain        11/16/23 02-05-09-C+ 1.4 90 160  34yrs, 4 months  05/09/24 02-05-09-C+ 2.3 90 160  3 yrs, 4 months                  Right Brain        11/16/23 2-C+ 3.2 60 160    04/1024 2-C+ 3.5 80 160

## 2024-05-15 ENCOUNTER — Other Ambulatory Visit: Payer: Self-pay | Admitting: Internal Medicine

## 2024-05-16 ENCOUNTER — Other Ambulatory Visit: Payer: Self-pay | Admitting: Internal Medicine

## 2024-05-16 ENCOUNTER — Telehealth: Payer: Self-pay | Admitting: Pharmacist

## 2024-05-16 DIAGNOSIS — I10 Essential (primary) hypertension: Secondary | ICD-10-CM

## 2024-05-16 MED ORDER — LOSARTAN POTASSIUM 50 MG PO TABS: 50.0000 mg | ORAL_TABLET | Freq: Every day | ORAL | 1 refills | Status: AC

## 2024-05-16 NOTE — Addendum Note (Signed)
 Addended by: Weylin Plagge T on: 05/16/2024 02:50 PM   Modules accepted: Orders

## 2024-05-16 NOTE — Progress Notes (Signed)
 Pharmacy Quality Measure Review  This patient is appearing on a report for being at risk of failing the adherence measure for hypertension (ACEi/ARB) medications this calendar year.   Medication: losartan  50 mg daily Last fill date: 01/18/24 for 90 day supply. No refills remain.  Will collaborate with provider to facilitate refill needs.  Catie [facility], PharmD, [facility] Clinical Pharmacist [phone]

## 2024-05-16 NOTE — Progress Notes (Unsigned)
 Date:  05/16/2024   Name:  David Rhodes   DOB:  04-Aug-1955   MRN:  [id number]   Chief Complaint: No chief complaint on file.  HPI  Review of Systems   Lab Results  Component Value Date   NA 138 05/07/2024   K 4.1 05/07/2024   CO2 25 05/07/2024   GLUCOSE 102 (H) 05/07/2024   BUN 8 05/07/2024   CREATININE 1.05 05/07/2024   CALCIUM 9.9 05/07/2024   EGFR 83 04/25/2022   GFRNONAA >60 05/07/2024   Lab Results  Component Value Date   CHOL 156 04/25/2022   HDL 63 04/25/2022   LDLCALC 82 04/25/2022   TRIG 51 04/25/2022   CHOLHDL 2.5 04/25/2022   Lab Results  Component Value Date   TSH 2.890 04/25/2022   Lab Results  Component Value Date   HGBA1C 5.3 12/25/2018   Lab Results  Component Value Date   WBC 9.5 05/07/2024   HGB 14.5 05/07/2024   HCT 44.5 05/07/2024   MCV 89.7 05/07/2024   PLT 219 05/07/2024   Lab Results  Component Value Date   ALT 19 04/25/2022   AST 23 04/25/2022   ALKPHOS 115 04/25/2022   BILITOT 0.4 04/25/2022   No results found for: MARIEN BOLLS, VD25OH   Patient Active Problem List   Diagnosis Date Noted   Severe persistent asthma, unspecified whether complicated (HCC) 02/19/2024   Other dysphagia 02/19/2024   End of battery life of deep brain stimulator 10/14/2023   Current moderate episode of major depressive disorder without prior episode (HCC) 02/26/2021   Primary osteoarthritis of first carpometacarpal joint of left hand 02/26/2021   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   Dependence on supplemental oxygen  04/18/2020   Leukocytosis 04/15/2019   COPD exacerbation (HCC) 12/30/2018   Recurrent inguinal hernia of right side without obstruction or gangrene 11/16/2018   Chronic left shoulder pain 10/05/2018   PICC (peripherally inserted central catheter) in place 04/16/2018   Essential tremor 10/18/2017   Cervical radiculopathy 07/23/2017   Bilateral temporomandibular joint pain 07/23/2017   Neck pain  05/19/2017   Medication monitoring encounter 09/11/2016   Pulmonary nodules 12/31/2015   COPD (chronic obstructive pulmonary disease) (HCC) 12/27/2015   Chronic cough 12/27/2015   Asthma 09/24/2015   Allergic rhinitis 11/11/2014   Nerve root pain 11/11/2014   Dyskinesia 11/11/2014   GERD (gastroesophageal reflux disease) 11/11/2014   Calcium blood increased 11/11/2014   Affective disorder 11/11/2014   Essential hypertension 12/25/2011   Chronic back pain 12/25/2011    Allergies  Allergen Reactions   Penicillins Shortness Of Breath    PATIENT HAS HAD A PCN REACTION WITH IMMEDIATE RASH, FACIAL/TONGUE/THROAT SWELLING, SOB, OR LIGHTHEADEDNESS WITH HYPOTENSION:  #  #  YES  #  #  Has patient had a PCN reaction causing severe rash involving mucus membranes or skin necrosis: No Has patient had a PCN reaction that required hospitalization No Has patient had a PCN reaction occurring within the last 10 years: No If all of the above answers are NO, then may proceed with Cephalosporin use. **tolerated cefazolin  and cephalexin  03/2017    Past Surgical History:  Procedure Laterality Date   DEEP BRAIN STIMULATOR PLACEMENT  12/03/2006   Dr. Unice [facility], [place])   INGUINAL HERNIA REPAIR Bilateral 02/07/2014   Procedure:  BILATERAL INGUINAL HERNIA REPAIR;  Surgeon: Lynda Leos, MD;  Location: [facility] OR;  Service: General;  Laterality: Bilateral;   INGUINAL HERNIA REPAIR Right 07/05/2021   INSERTION OF  MESH Bilateral 02/07/2014   Procedure: INSERTION OF MESH;  Surgeon: Lynda Leos, MD;  Location: [facility] OR;  Service: General;  Laterality: Bilateral;   NASAL SINUS SURGERY     PULSE GENERATOR IMPLANT Left 09/11/2016   Procedure: Left chest-Change implantable pulse generator battery;  Surgeon: Fairy Levels, MD;  Location: [facility] OR;  Service: Neurosurgery;  Laterality: Left;  Left chest-Change implantable pulse generator battery   PULSE GENERATOR IMPLANT Left 04/06/2018   Procedure: Revision of left  chest implantable pulse generator, extension, and pocket adapter;  Surgeon: Levels Fairy, MD;  Location: [facility] OR;  Service: Neurosurgery;  Laterality: Left;   PULSE GENERATOR IMPLANT N/A 10/14/2023   Procedure: UNILATERAL PULSE GENERATOR IMPLANT;  Surgeon: Clois Fret, MD;  Location: [facility] ORS;  Service: Neurosurgery;  Laterality: N/A;  REPLACE PULSE GENERATOR FOR MEDTRONIC DEEP BRAIN STIMULATOR, LOCAL W/ MAC   SUBTHALAMIC STIMULATOR BATTERY REPLACEMENT  12/09/2011   Procedure: SUBTHALAMIC STIMULATOR BATTERY REPLACEMENT;  Surgeon: Fairy Levels, MD;  Location: [facility];  Service: Neurosurgery;  Laterality: N/A;   deep brain stimulator, implantable pulse generator change   SUBTHALAMIC STIMULATOR BATTERY REPLACEMENT Left 04/21/2014   Procedure: Deep brain stimulator battery change;  Surgeon: Fairy Levels, MD;  Location: [facility];  Service: Neurosurgery;  Laterality: Left;  Deep brain stimulator battery change   SUBTHALAMIC STIMULATOR BATTERY REPLACEMENT N/A 08/10/2018   Procedure: Replacement of deep brain stimulator pulse generator/left abdomen with long lead extensions;  Surgeon: Levels Fairy, MD;  Location: [facility] OR;  Service: Neurosurgery;  Laterality: N/A;    Social History   Tobacco Use   Smoking status: Never    Passive exposure: Past   Smokeless tobacco: Current    Types: Chew   Tobacco comments:    Trying to taper off.  Too expensive  Vaping Use   Vaping status: Never Used  Substance Use Topics   Alcohol use: No   Drug use: No     Medication list has been reviewed and updated.  No outpatient medications have been marked as taking for the 05/16/24 encounter (Orders Only) with Justus Leita DEL, MD.       04/26/2024    2:20 PM 02/19/2024    2:26 PM 04/25/2022    8:44 AM 10/21/2021    8:28 AM  GAD 7 : Generalized Anxiety Score  Nervous, Anxious, on Edge 2 3 3  0  Control/stop worrying 0 0 0 0  Worry too much - different things 0 0 0 0  Trouble relaxing 1 2 2  0   Restless 1 3 3  0  Easily annoyed or irritable 0 0 0 0  Afraid - awful might happen 1 0 0 0  Total GAD 7 Score 5 8 8  0  Anxiety Difficulty Somewhat difficult Very difficult Extremely difficult        04/26/2024    2:19 PM 02/19/2024    2:26 PM 04/30/2022   10:21 AM  Depression screen PHQ 2/9  Decreased Interest 2 2 2   Down, Depressed, Hopeless 2 2 2   PHQ - 2 Score 4 4 4   Altered sleeping 2 2 1   Tired, decreased energy 3 3 3   Change in appetite 3 2 1   Feeling bad or failure about yourself  2 2 2   Trouble concentrating 1 1 1   Moving slowly or fidgety/restless 2 0 3  Suicidal thoughts 0 0 0  PHQ-9 Score 17  14  15    Difficult doing work/chores Very difficult Somewhat difficult Very difficult  Data saved with a previous flowsheet row definition    BP Readings from Last 3 Encounters:  05/10/24 (!) 158/96  05/07/24 (!) 162/117  04/26/24 128/70    Physical Exam  Wt Readings from Last 3 Encounters:  05/09/24 153 lb (69.4 kg)  05/07/24 153 lb (69.4 kg)  04/26/24 153 lb (69.4 kg)    There were no vitals taken for this visit.  Assessment and Plan:  Problem List Items Addressed This Visit   None   No follow-ups on file.    Leita HILARIO Adie, MD [facility] [place]

## 2024-05-17 NOTE — Telephone Encounter (Signed)
 Requested Prescriptions  Pending Prescriptions Disp Refills   rOPINIRole  (REQUIP ) 2 MG tablet [Pharmacy Med Name: ROPINIROLE  2MG  TABLETS] 60 tablet 0    Sig: TAKE 1 TABLET(2 MG) BY MOUTH TWICE DAILY     Neurology:  Parkinsonian Agents Failed - 05/17/2024  3:25 PM      Failed - Last BP in normal range    BP Readings from Last 1 Encounters:  05/10/24 (!) 158/96         Passed - Last Heart Rate in normal range    Pulse Readings from Last 1 Encounters:  05/09/24 (!) 106         Passed - Valid encounter within last 12 months    Recent Outpatient Visits           3 weeks ago Recurrent inguinal hernia of right side without obstruction or gangrene   [place] [facility] at [facility], Leita DEL, MD   2 months ago Right upper lobe pulmonary nodule   [place] [facility] at [facility], Leita DEL, MD   7 months ago Acute recurrent maxillary sinusitis   [facility] at [facility], Leita DEL, MD

## 2024-06-02 ENCOUNTER — Other Ambulatory Visit: Payer: Self-pay | Admitting: Internal Medicine

## 2024-06-02 DIAGNOSIS — K219 Gastro-esophageal reflux disease without esophagitis: Secondary | ICD-10-CM

## 2024-06-04 NOTE — Telephone Encounter (Signed)
 Requested Prescriptions  Refused Prescriptions Disp Refills   omeprazole  (PRILOSEC) 20 MG capsule [Pharmacy Med Name: OMEPRAZOLE  20MG  CAPSULES] 90 capsule 1    Sig: TAKE 1 CAPSULE(20 MG) BY MOUTH DAILY     Gastroenterology: Proton Pump Inhibitors Passed - 06/04/2024  7:52 PM      Passed - Valid encounter within last 12 months    Recent Outpatient Visits           1 month ago Recurrent inguinal hernia of right side without obstruction or gangrene   [place] [facility] at [facility], Leita DEL, MD   3 months ago Right upper lobe pulmonary nodule   [place] [facility] at [facility], Leita DEL, MD   8 months ago Acute recurrent maxillary sinusitis   [facility] at [facility], Leita DEL, MD

## 2024-06-13 ENCOUNTER — Other Ambulatory Visit: Payer: Self-pay | Admitting: Internal Medicine

## 2024-06-16 NOTE — Telephone Encounter (Signed)
 Requested medication (s) are due for refill today: na   Requested medication (s) are on the active medication list: yes   Last refill:  05/17/24 #60 0 refills   Future visit scheduled: no   Notes to clinic:  no refills remain. Do you want to refill Rx?     Requested Prescriptions  Pending Prescriptions Disp Refills   rOPINIRole  (REQUIP ) 2 MG tablet [Pharmacy Med Name: ROPINIROLE  2MG  TABLETS] 60 tablet 0    Sig: TAKE 1 TABLET(2 MG) BY MOUTH TWICE DAILY     Neurology:  Parkinsonian Agents Failed - 06/16/2024  1:02 PM      Failed - Last BP in normal range    BP Readings from Last 1 Encounters:  05/10/24 (!) 158/96         Passed - Last Heart Rate in normal range    Pulse Readings from Last 1 Encounters:  05/09/24 (!) 106         Passed - Valid encounter within last 12 months    Recent Outpatient Visits           1 month ago Recurrent inguinal hernia of right side without obstruction or gangrene   [place] [facility] at [facility], Leita DEL, MD   3 months ago Right upper lobe pulmonary nodule    Primary Care & Sports Medicine at [facility], Leita DEL, MD   8 months ago Acute recurrent maxillary sinusitis   [facility] at [facility], Leita DEL, MD

## 2024-06-16 NOTE — Telephone Encounter (Signed)
 Please review medication refill request

## 2024-07-08 ENCOUNTER — Encounter: Payer: Self-pay | Admitting: Neurology

## 2024-07-14 ENCOUNTER — Telehealth: Payer: Self-pay | Admitting: Neurology

## 2024-07-15 NOTE — Progress Notes (Unsigned)
 " Assessment/Plan:   1.  Essential Tremor.  - Status post bilateral VIM DBS in June, 2008 with Dr. Unice.  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  This was removed and IPG is now on the left abdomen.  Last IPG change was October 14, 2023  - Patient has had speech change with the left brain lead.  This has been reprogrammed multiple times, without significant improvement.  - Poor compliance with follow-up has been an issue in the past.  Reminded him he does have a follow-up already scheduled in the future and encouraged him to keep that follow-up.  Glad that he came today when he felt that symptoms were worse.  2.  Restless leg  - Is off high dose gabapentin   - on ropinirole  2 mg twice per day.  Discussed with patient that this medication is no longer recommended by the American Academy of sleep medicine for restless leg because of augmentation.  He is on quite a large dose and I would recommend that this medication be tapered.  3.  COPD  - Has been in the urgent care on the emergency room recently.  Admission was recently offered a few days ago and patient left AMA.  Encouraged him to rethink that decision.  - Discussed that medications for his lungs can worsen tremor.  Subjective:   David Rhodes was seen in consultation in the follow-up.  He was worked in Thursday for today's appointment.  I did adjust his device in November.  He reports today that ***.   Allergies  Allergen Reactions   Penicillins Shortness Of Breath    PATIENT HAS HAD A PCN REACTION WITH IMMEDIATE RASH, FACIAL/TONGUE/THROAT SWELLING, SOB, OR LIGHTHEADEDNESS WITH HYPOTENSION:  #  #  YES  #  #  Has patient had a PCN reaction causing severe rash involving mucus membranes or skin necrosis: No Has patient had a PCN reaction that required hospitalization No Has patient had a PCN reaction occurring within the last 10 years: No If all of the above answers are NO, then may proceed with  Cephalosporin use. **tolerated cefazolin  and cephalexin  03/2017    No outpatient medications have been marked as taking for the 07/18/24 encounter (Appointment) with Zoye Chandra, [place] RAMAN, DO.      Objective:   VITALS:   There were no vitals filed for this visit.   Gen:  Appears stated age and in NAD. HEENT:  Normocephalic, atraumatic. The mucous membranes are moist. The superficial temporal arteries are without ropiness or tenderness. Cardiovascular: Regular rate and rhythm. Lungs: Clear to auscultation bilaterally.  He is wearing oxygen .  There is some dyspnea on exertion. Neck: There are no carotid bruits noted bilaterally.  NEUROLOGICAL:  Orientation:  The patient is alert and oriented x 3.   Cranial nerves: There is good facial symmetry.  Tone: Tone is good throughout. Sensation: Sensation is intact to light touch touch throughout (facial, trunk, extremities).  Coordination:  The patient has no dysdiadichokinesia or dysmetria. Motor: Strength is at least antigravity x 4. Gait and Station: He is initially slightly off balance when he gets out of the chair.  The patient ambulates well in the hall with his oxygen , however.    MOVEMENT EXAM: Tremor:  There is initially some very mild postural tremor.  He did have trouble with Archimedes spirals initially, especially on the left.  This was markedly improved post programming.  I have reviewed and interpreted the following labs independently  Chemistry      Component Value Date/Time   NA 138 05/07/2024 0335   NA 142 04/25/2022 0928   NA 138 06/26/2012 1812   K 4.1 05/07/2024 0335   K 4.0 06/26/2012 1812   CL 102 05/07/2024 0335   CL 106 06/26/2012 1812   CO2 25 05/07/2024 0335   CO2 24 06/26/2012 1812   BUN 8 05/07/2024 0335   BUN 16 04/25/2022 0928   BUN 13 06/26/2012 1812   CREATININE 1.05 05/07/2024 0335   CREATININE 0.76 04/16/2018 1206      Component Value Date/Time   CALCIUM 9.9 05/07/2024 0335   CALCIUM 9.4  06/26/2012 1812   ALKPHOS 115 04/25/2022 0928   ALKPHOS 84 07/20/2011 0208   AST 23 04/25/2022 0928   AST 18 07/20/2011 0208   ALT 19 04/25/2022 0928   ALT 22 07/20/2011 0208   BILITOT 0.4 04/25/2022 0928   BILITOT 0.3 07/20/2011 0208      Lab Results  Component Value Date   WBC 9.5 05/07/2024   HGB 14.5 05/07/2024   HCT 44.5 05/07/2024   MCV 89.7 05/07/2024   PLT 219 05/07/2024   Lab Results  Component Value Date   TSH 2.890 04/25/2022      Total time spent on today's visit was *** minutes, including both face-to-face time and nonface-to-face time.  Time included that spent on review of records (prior notes available to me/labs/imaging if pertinent), discussing treatment and goals, answering patient's questions and coordinating care.  This did not include the DBS time.  CC:  Justus Leita DEL, MD   "

## 2024-07-18 ENCOUNTER — Encounter: Payer: Self-pay | Admitting: Neurology

## 2024-07-27 ENCOUNTER — Ambulatory Visit: Admitting: Surgery

## 2024-07-27 ENCOUNTER — Other Ambulatory Visit: Payer: Self-pay

## 2024-07-27 MED ORDER — ROPINIROLE HCL 2 MG PO TABS: 2.0000 mg | ORAL_TABLET | Freq: Two times a day (BID) | ORAL | 0 refills | Status: AC

## 2024-11-07 ENCOUNTER — Encounter: Admitting: Neurology

## 2024-11-14 ENCOUNTER — Encounter: Admitting: Neurology
